# Patient Record
Sex: Male | Born: 1950
Health system: Southern US, Community
[De-identification: ages and names within clinical notes are randomized; demographics above are authoritative.]

## PROBLEM LIST (undated history)

## (undated) DIAGNOSIS — R2689 Other abnormalities of gait and mobility: Secondary | ICD-10-CM

## (undated) DIAGNOSIS — K56609 Unspecified intestinal obstruction, unspecified as to partial versus complete obstruction: Secondary | ICD-10-CM

## (undated) DIAGNOSIS — I10 Essential (primary) hypertension: Secondary | ICD-10-CM

## (undated) DIAGNOSIS — E7800 Pure hypercholesterolemia, unspecified: Secondary | ICD-10-CM

## (undated) DIAGNOSIS — M109 Gout, unspecified: Secondary | ICD-10-CM

## (undated) DIAGNOSIS — G20A1 Parkinson's disease without dyskinesia, without mention of fluctuations: Secondary | ICD-10-CM

## (undated) HISTORY — DX: Parkinson's disease without dyskinesia, without mention of fluctuations: G20.A1

## (undated) HISTORY — DX: Gout, unspecified: M10.9

## (undated) HISTORY — DX: Unspecified intestinal obstruction, unspecified as to partial versus complete obstruction: K56.609

## (undated) HISTORY — DX: Other abnormalities of gait and mobility: R26.89

## (undated) HISTORY — DX: Essential (primary) hypertension: I10

## (undated) HISTORY — DX: Pure hypercholesterolemia, unspecified: E78.00

---

## 2002-10-24 ENCOUNTER — Ambulatory Visit (HOSPITAL_COMMUNITY): Admission: RE | Admit: 2002-10-24 | Discharge: 2002-10-24 | Payer: Self-pay | Admitting: Gastroenterology

## 2016-11-25 DIAGNOSIS — I10 Essential (primary) hypertension: Secondary | ICD-10-CM | POA: Diagnosis not present

## 2016-11-25 DIAGNOSIS — Z0000 Encounter for general adult medical examination without abnormal findings: Secondary | ICD-10-CM | POA: Diagnosis not present

## 2016-11-25 DIAGNOSIS — Z23 Encounter for immunization: Secondary | ICD-10-CM | POA: Diagnosis not present

## 2016-11-25 DIAGNOSIS — E7800 Pure hypercholesterolemia, unspecified: Secondary | ICD-10-CM | POA: Diagnosis not present

## 2016-11-25 DIAGNOSIS — Z9181 History of falling: Secondary | ICD-10-CM | POA: Diagnosis not present

## 2016-11-25 DIAGNOSIS — M1A9XX1 Chronic gout, unspecified, with tophus (tophi): Secondary | ICD-10-CM | POA: Diagnosis not present

## 2016-11-25 DIAGNOSIS — Z125 Encounter for screening for malignant neoplasm of prostate: Secondary | ICD-10-CM | POA: Diagnosis not present

## 2016-11-27 ENCOUNTER — Other Ambulatory Visit: Payer: Self-pay | Admitting: Family Medicine

## 2016-11-27 DIAGNOSIS — Z0000 Encounter for general adult medical examination without abnormal findings: Secondary | ICD-10-CM

## 2016-12-05 ENCOUNTER — Ambulatory Visit
Admission: RE | Admit: 2016-12-05 | Discharge: 2016-12-05 | Disposition: A | Discharge status: Home / Self Care | Payer: Medicare Other | Source: Ambulatory Visit | Attending: Family Medicine | Admitting: Family Medicine

## 2016-12-05 DIAGNOSIS — Z0000 Encounter for general adult medical examination without abnormal findings: Secondary | ICD-10-CM

## 2016-12-05 DIAGNOSIS — Z8489 Family history of other specified conditions: Secondary | ICD-10-CM | POA: Diagnosis not present

## 2016-12-05 DIAGNOSIS — Z8249 Family history of ischemic heart disease and other diseases of the circulatory system: Secondary | ICD-10-CM | POA: Diagnosis not present

## 2016-12-05 DIAGNOSIS — Z136 Encounter for screening for cardiovascular disorders: Secondary | ICD-10-CM | POA: Diagnosis not present

## 2016-12-05 IMAGING — US US AORTA SCREENING (MEDICARE)
1 series · 13 of 13 positions shown · non-contrast
Comparison: None.

CLINICAL DATA: Patient with family history of AAA.

EXAM:
US ABDOMINAL AORTA MEDICARE SCREENING
TECHNIQUE: Ultrasound examination of the abdominal aorta was performed as a
screening evaluation for abdominal aortic aneurysm.

[Series 1: us aorta screening (medicare) · 0.25mm/px · 13 of 13 slices shown]
[im 1/13]
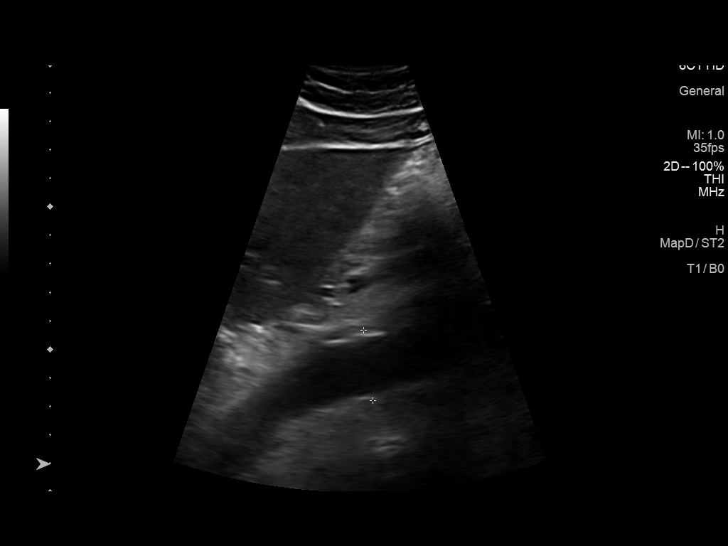
[im 2/13]
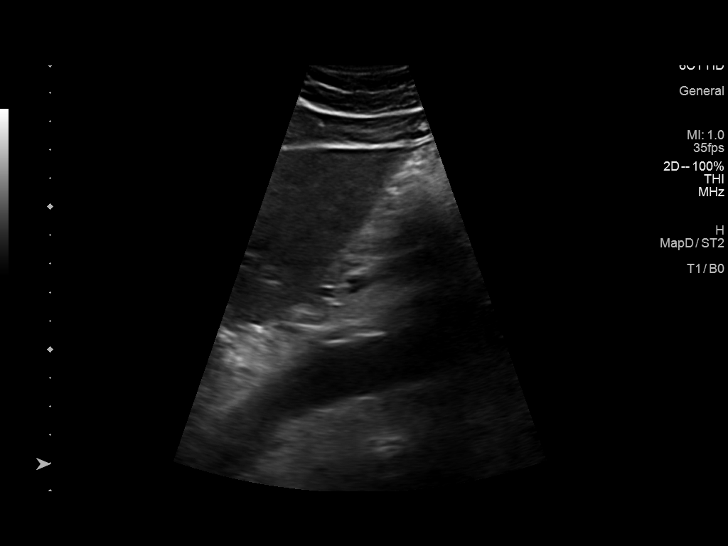
[im 3/13]
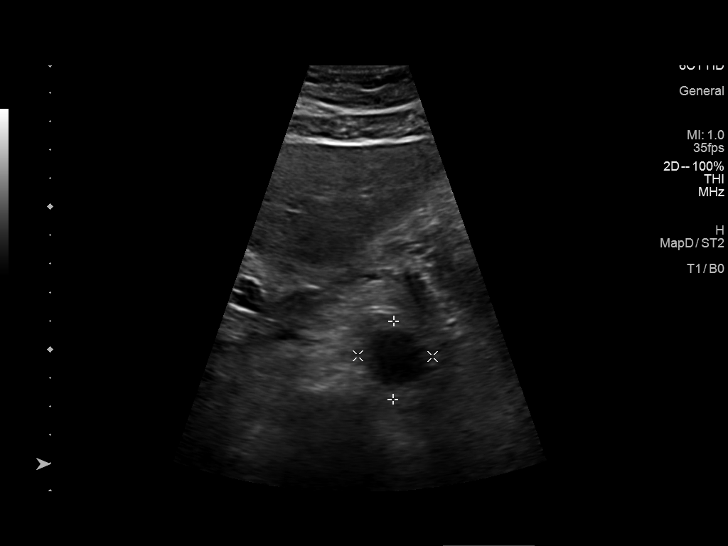
[im 4/13]
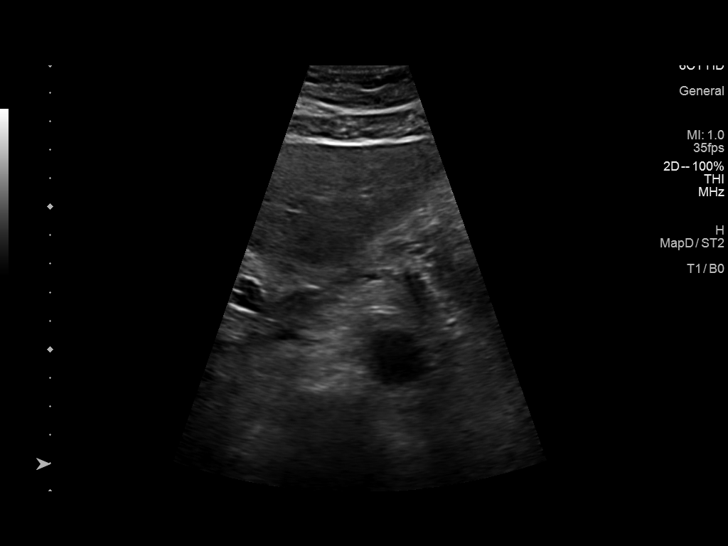
[im 5/13]
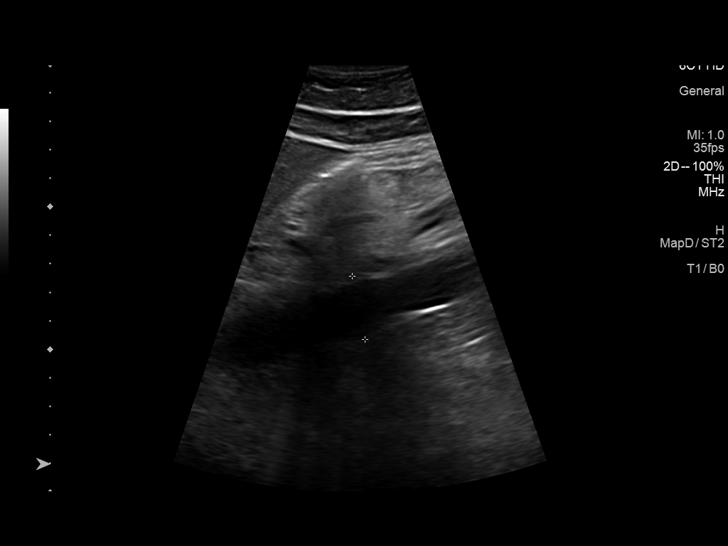
[im 6/13]
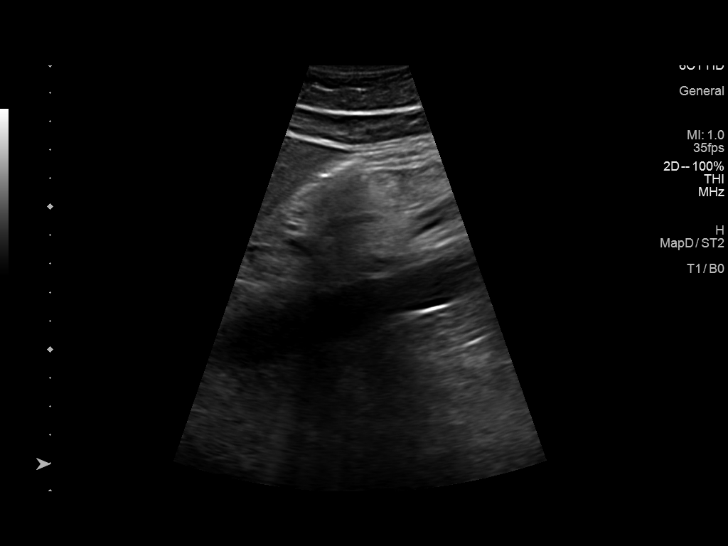
[im 7/13]
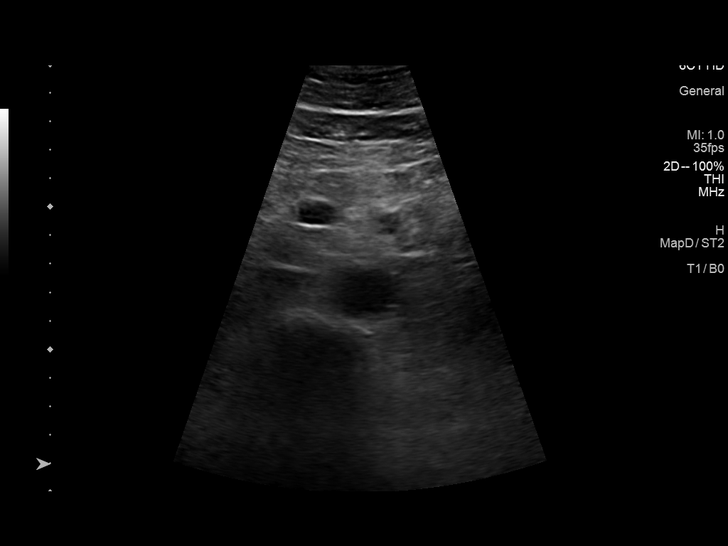
[im 8/13]
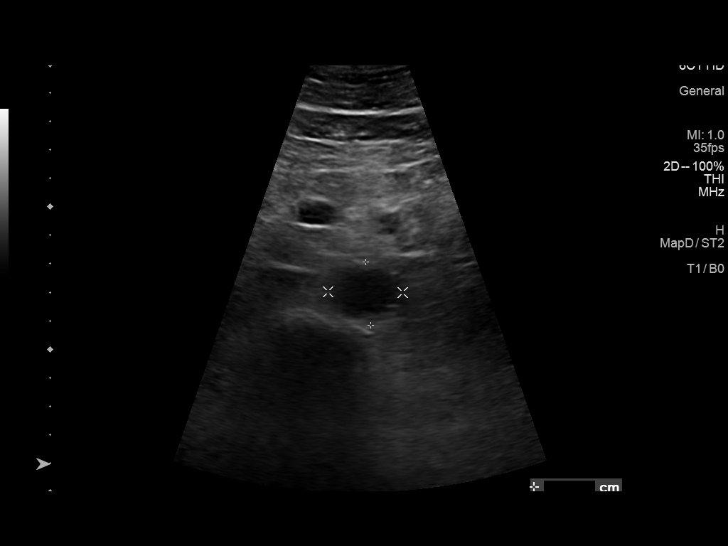
[im 9/13]
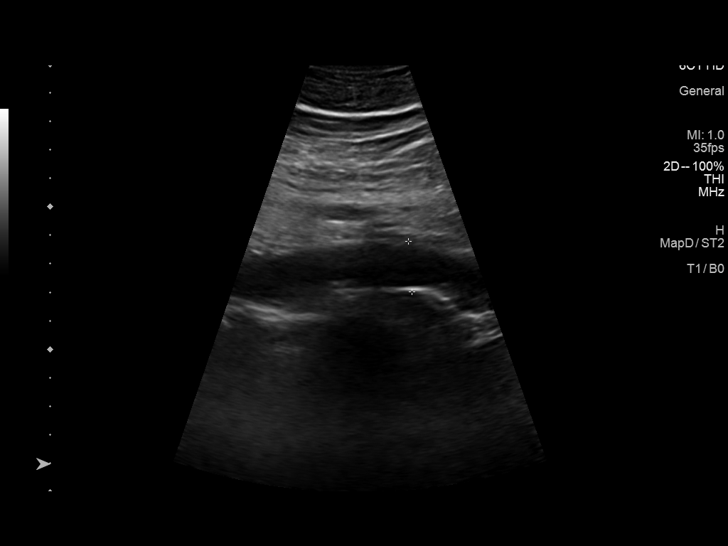
[im 10/13]
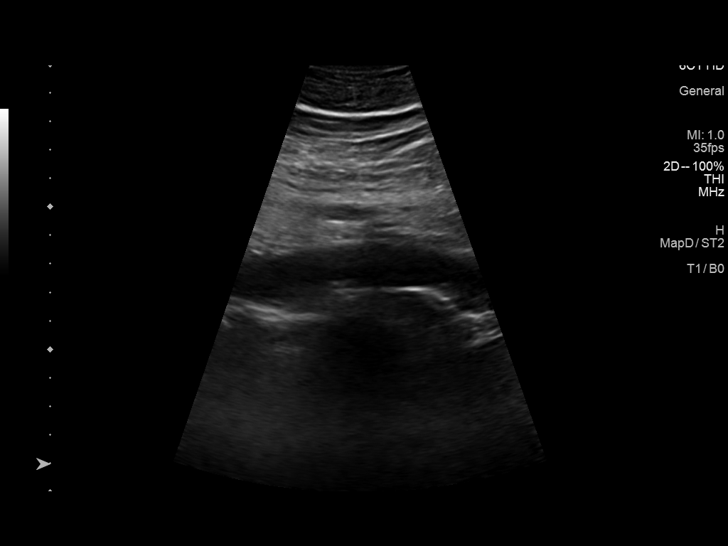
[im 11/13]
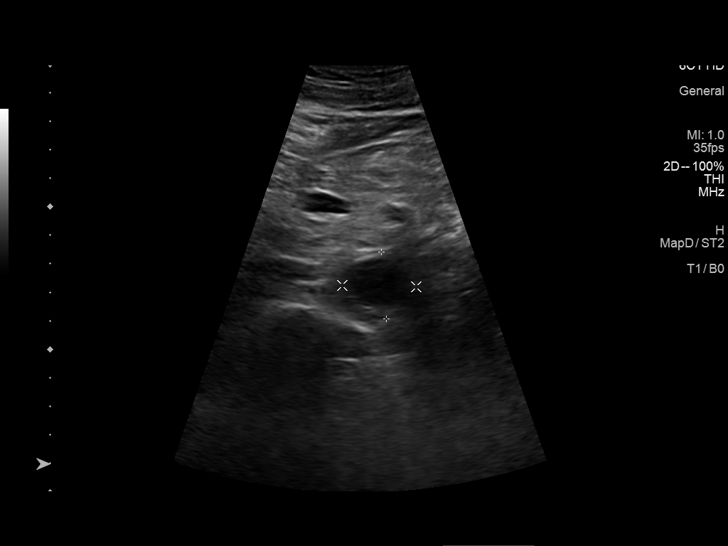
[im 12/13]
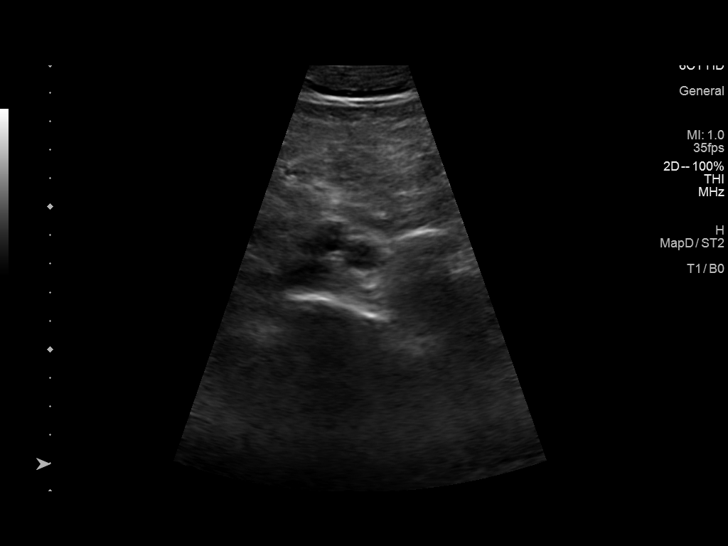
[im 13/13]
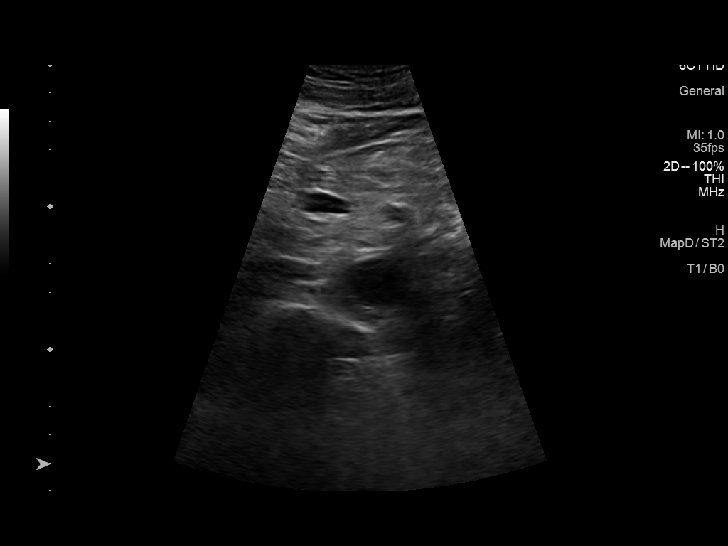

[13 of 13 positions shown; findings below may reference images not displayed]

FINDINGS: Abdominal aortic measurements as follows:

Proximal:  2.6 cm

Mid:  2.6 cm

Distal:  2.6 cm
IMPRESSION: Maximal diameter of the abdominal aorta is 2.6 cm. Ectatic abdominal
aorta at risk for aneurysm development. Recommend followup by
ultrasound in 5 years. This recommendation follows ACR consensus
guidelines: White Paper of the ACR Incidental Findings Committee II

## 2017-08-13 DIAGNOSIS — H2513 Age-related nuclear cataract, bilateral: Secondary | ICD-10-CM | POA: Diagnosis not present

## 2017-12-14 DIAGNOSIS — I10 Essential (primary) hypertension: Secondary | ICD-10-CM | POA: Diagnosis not present

## 2017-12-14 DIAGNOSIS — E7800 Pure hypercholesterolemia, unspecified: Secondary | ICD-10-CM | POA: Diagnosis not present

## 2017-12-14 DIAGNOSIS — Z125 Encounter for screening for malignant neoplasm of prostate: Secondary | ICD-10-CM | POA: Diagnosis not present

## 2017-12-17 DIAGNOSIS — Z0000 Encounter for general adult medical examination without abnormal findings: Secondary | ICD-10-CM | POA: Diagnosis not present

## 2017-12-17 DIAGNOSIS — M1A9XX1 Chronic gout, unspecified, with tophus (tophi): Secondary | ICD-10-CM | POA: Diagnosis not present

## 2017-12-17 DIAGNOSIS — Z1389 Encounter for screening for other disorder: Secondary | ICD-10-CM | POA: Diagnosis not present

## 2017-12-17 DIAGNOSIS — R2689 Other abnormalities of gait and mobility: Secondary | ICD-10-CM | POA: Diagnosis not present

## 2017-12-17 DIAGNOSIS — Z23 Encounter for immunization: Secondary | ICD-10-CM | POA: Diagnosis not present

## 2017-12-17 DIAGNOSIS — E7800 Pure hypercholesterolemia, unspecified: Secondary | ICD-10-CM | POA: Diagnosis not present

## 2017-12-17 DIAGNOSIS — I10 Essential (primary) hypertension: Secondary | ICD-10-CM | POA: Diagnosis not present

## 2017-12-18 NOTE — Progress Notes (Signed)
Cody Dickerson was seen today in the movement disorders clinic for neurologic consultation at the request of Lawerance Cruel, MD.  The consultation is for the evaluation of tremor and balance change and to r/o PD.  The records that were made available to me were reviewed.   Specific Symptoms:  Tremor: Yes.  , when first wakes up and then goes away when gets up.  Mostly the L hand.  He is R hand dominant.  Started few years ago.  Is not daily.  Does not persist in the day. Family hx of similar:  No. Voice: no change Sleep: some trouble d/t getting up to using the RR  Vivid Dreams:  No.  Acting out dreams:  No. Wet Pillows: No. Postural symptoms:  Yes.    - intermittently.  Falls?  No. (only fall was a few years ago in garden) Bradykinesia symptoms: no bradykinesia noted Loss of smell:  no Loss of taste:  some Urinary Incontinence:  No. Difficulty Swallowing:  No. Handwriting, micrographia: Yes.   and sloppier Trouble with ADL's:  No.  Trouble buttoning clothing: No. Depression:  No. Memory changes:  Minor word finding trouble Hallucinations:  No.  visual distortions: No. N/V:  No. Lightheaded:  No.  Syncope: No. Diplopia:  No. Dyskinesia:  No. Prior exposure to reglan/antipsychotics: No.  Neuroimaging of the brain has not previously been performed.   PREVIOUS MEDICATIONS: none to date  ALLERGIES:   Allergies  Allergen Reactions  . Lisinopril     CURRENT MEDICATIONS:  Outpatient Encounter Medications as of 12/22/2017  Medication Sig  . indomethacin (INDOCIN) 50 MG capsule 3 (three) times daily as needed.   Marland Kitchen losartan (COZAAR) 100 MG tablet daily.   . Multiple Vitamin (MULTIVITAMIN) tablet Take 1 tablet by mouth daily.  . rosuvastatin (CRESTOR) 20 MG tablet daily.    No facility-administered encounter medications on file as of 12/22/2017.     PAST MEDICAL HISTORY:   Past Medical History:  Diagnosis Date  . Balance problem   . Gout   .  Hypercholesterolemia   . Hypertension     PAST SURGICAL HISTORY:   Past Surgical History:  Procedure Laterality Date  . no surgical history      SOCIAL HISTORY:   Social History   Socioeconomic History  . Marital status: Married    Spouse name: Not on file  . Number of children: 2  . Years of education: 16  . Highest education level: Bachelor's degree (e.g., BA, AB, BS)  Occupational History  . Occupation: Geographical information systems officer  . Financial resource strain: Not on file  . Food insecurity:    Worry: Not on file    Inability: Not on file  . Transportation needs:    Medical: Not on file    Non-medical: Not on file  Tobacco Use  . Smoking status: Former Research scientist (life sciences)  . Smokeless tobacco: Never Used  Substance and Sexual Activity  . Alcohol use: Yes    Comment: 2 glasses wine/day  . Drug use: Never  . Sexual activity: Not on file  Lifestyle  . Physical activity:    Days per week: Not on file    Minutes per session: Not on file  . Stress: Not on file  Relationships  . Social connections:    Talks on phone: Not on file    Gets together: Not on file    Attends religious service: Not on file    Active member of club  or organization: Not on file    Attends meetings of clubs or organizations: Not on file    Relationship status: Not on file  . Intimate partner violence:    Fear of current or ex partner: Not on file    Emotionally abused: Not on file    Physically abused: Not on file    Forced sexual activity: Not on file  Other Topics Concern  . Not on file  Social History Narrative   Lives with wife in a one story home.  Has 2 children.  Self employed Hotel manager.  Education: college.     FAMILY HISTORY:   Family Status  Relation Name Status  . Mother  Alive  . Father  Alive  . Sister  Alive  . Brother  Alive  . Child 2 Alive    ROS:  Review of Systems  Constitutional: Positive for weight loss (via diet).  HENT: Negative.   Eyes: Negative.   Respiratory: Negative.    Cardiovascular: Negative.   Gastrointestinal: Negative.   Genitourinary: Negative.   Musculoskeletal: Negative.   Skin: Negative.   Endo/Heme/Allergies: Negative.   Psychiatric/Behavioral: Negative.     PHYSICAL EXAMINATION:    VITALS:   Vitals:   12/22/17 0838  BP: 130/88  Pulse: 63  SpO2: 98%  Weight: 181 lb (82.1 kg)  Height: 5' 8.5" (1.74 m)    GEN:  The patient appears stated age and is in NAD. HEENT:  Normocephalic, atraumatic.  The mucous membranes are moist. The superficial temporal arteries are without ropiness or tenderness. CV:  RRR Lungs:  CTAB Neck/HEME:  There are no carotid bruits bilaterally.  Neurological examination:  Orientation: The patient is alert and oriented x3. Fund of knowledge is appropriate.  Recent and remote memory are intact.  Attention and concentration are normal.    Able to name objects and repeat phrases. Cranial nerves: There is good facial symmetry. Pupils are equal round and reactive to light bilaterally. Fundoscopic exam reveals clear margins bilaterally. Extraocular muscles are intact. The visual fields are full to confrontational testing. The speech is fluent and clear. Soft palate rises symmetrically and there is no tongue deviation. Hearing is intact to conversational tone. Sensation: Sensation is intact to light and pinprick throughout (facial, trunk, extremities). Vibration is intact at the bilateral big toe. There is no extinction with double simultaneous stimulation. There is no sensory dermatomal level identified. Motor: Strength is 5/5 in the bilateral upper and lower extremities.   Shoulder shrug is equal and symmetric.  There is no pronator drift. Deep tendon reflexes: Deep tendon reflexes are 2/4 at the bilateral biceps, triceps, brachioradialis, patella and achilles. Plantar responses are downgoing bilaterally.  Movement examination: Tone: There is normal tone in the bilateral upper extremities.  The tone in the lower  extremities is normal.  Abnormal movements: There is no rest tremor, even with distraction procedures.  There is no tremor of the outstretched hands.  There is no intention tremor.  He is able to pour water from one glass to another. Coordination:  There is no decremation with RAM's, with any form of RAMS, including alternating supination and pronation of the forearm, hand opening and closing, finger taps, heel taps and toe taps. Gait and Station: The patient has no difficulty arising out of a deep-seated chair without the use of the hands. The patient's stride length is normal.  He is able to ambulate in a tandem fashion.  He is able to stand in the Romberg position.  Labs: Lab work was completed on December 14, 2017.  Sodium was 141, potassium 4.8, chloride 105, CO2 29, BUN 18 and creatinine 0.95.  Total cholesterol is 186, LDL 94.  ASSESSMENT/PLAN:  1.  Gait change  -I did not see any tremor or gait instability on examination today.  I reassured him that I saw no evidence of a neurodegenerative process.  He does not meet any criteria for Parkinson's disease.  I did check his TSH.  I did tell him that if things should worsen or new neurologic symptoms develop, I would like to see him back.  Otherwise, I will see him on an as-needed basis.  Cc:  Lawerance Cruel, MD

## 2017-12-22 ENCOUNTER — Ambulatory Visit (INDEPENDENT_AMBULATORY_CARE_PROVIDER_SITE_OTHER): Payer: Medicare Other | Admitting: Neurology

## 2017-12-22 ENCOUNTER — Other Ambulatory Visit (INDEPENDENT_AMBULATORY_CARE_PROVIDER_SITE_OTHER): Payer: Medicare Other

## 2017-12-22 ENCOUNTER — Telehealth: Payer: Self-pay | Admitting: *Deleted

## 2017-12-22 ENCOUNTER — Encounter: Payer: Self-pay | Admitting: Neurology

## 2017-12-22 VITALS — BP 130/88 | HR 63 | Ht 68.5 in | Wt 181.0 lb

## 2017-12-22 DIAGNOSIS — R251 Tremor, unspecified: Secondary | ICD-10-CM

## 2017-12-22 LAB — TSH: TSH: 1.69 u[IU]/mL (ref 0.35–4.50)

## 2017-12-22 NOTE — Telephone Encounter (Signed)
Left message notifying patient.

## 2017-12-22 NOTE — Telephone Encounter (Signed)
-----   Message from [place], DO sent at 12/22/2017 12:27 PM EDT ----- Let pt know that TSH is normal

## 2018-09-23 DIAGNOSIS — H5203 Hypermetropia, bilateral: Secondary | ICD-10-CM | POA: Diagnosis not present

## 2018-09-23 DIAGNOSIS — H524 Presbyopia: Secondary | ICD-10-CM | POA: Diagnosis not present

## 2018-09-23 DIAGNOSIS — H25013 Cortical age-related cataract, bilateral: Secondary | ICD-10-CM | POA: Diagnosis not present

## 2018-09-23 DIAGNOSIS — H2513 Age-related nuclear cataract, bilateral: Secondary | ICD-10-CM | POA: Diagnosis not present

## 2018-11-29 DIAGNOSIS — Z23 Encounter for immunization: Secondary | ICD-10-CM | POA: Diagnosis not present

## 2018-12-23 DIAGNOSIS — M1A9XX1 Chronic gout, unspecified, with tophus (tophi): Secondary | ICD-10-CM | POA: Diagnosis not present

## 2018-12-23 DIAGNOSIS — E7800 Pure hypercholesterolemia, unspecified: Secondary | ICD-10-CM | POA: Diagnosis not present

## 2018-12-23 DIAGNOSIS — I10 Essential (primary) hypertension: Secondary | ICD-10-CM | POA: Diagnosis not present

## 2018-12-23 DIAGNOSIS — Z125 Encounter for screening for malignant neoplasm of prostate: Secondary | ICD-10-CM | POA: Diagnosis not present

## 2018-12-28 DIAGNOSIS — L570 Actinic keratosis: Secondary | ICD-10-CM | POA: Diagnosis not present

## 2018-12-28 DIAGNOSIS — M1A9XX1 Chronic gout, unspecified, with tophus (tophi): Secondary | ICD-10-CM | POA: Diagnosis not present

## 2018-12-28 DIAGNOSIS — I10 Essential (primary) hypertension: Secondary | ICD-10-CM | POA: Diagnosis not present

## 2018-12-28 DIAGNOSIS — E7800 Pure hypercholesterolemia, unspecified: Secondary | ICD-10-CM | POA: Diagnosis not present

## 2018-12-28 DIAGNOSIS — Z0000 Encounter for general adult medical examination without abnormal findings: Secondary | ICD-10-CM | POA: Diagnosis not present

## 2019-03-22 ENCOUNTER — Ambulatory Visit: Payer: Medicare Other | Attending: Internal Medicine

## 2019-03-22 DIAGNOSIS — Z23 Encounter for immunization: Secondary | ICD-10-CM | POA: Insufficient documentation

## 2019-03-22 NOTE — Progress Notes (Signed)
   Covid-19 Vaccination Clinic  Name:  Cody Dickerson    MRN: [id number] DOB: Jun 11, 1950  03/22/2019  [name] was observed post Covid-19 immunization for 15 minutes without incidence. He was provided with Vaccine Information Sheet and instruction to access the V-Safe system.   [name] was instructed to call 911 with any severe reactions post vaccine: Marland Kitchen Difficulty breathing  . Swelling of your face and throat  . A fast heartbeat  . A bad rash all over your body  . Dizziness and weakness    Immunizations Administered    Name Date Dose VIS Date Route   Pfizer COVID-19 Vaccine 03/22/2019  1:23 PM 0.3 mL 02/11/2019 Intramuscular   Manufacturer: [place]   Lot: [id number]   [place]: [id number]

## 2019-04-11 ENCOUNTER — Ambulatory Visit: Payer: Medicare Other | Attending: Internal Medicine

## 2019-04-11 DIAGNOSIS — Z23 Encounter for immunization: Secondary | ICD-10-CM | POA: Insufficient documentation

## 2019-04-11 NOTE — Progress Notes (Signed)
   Covid-19 Vaccination Clinic  Name:  Cody Dickerson    MRN: [id number] DOB: May 23, 1950  04/11/2019  [name] was observed post Covid-19 immunization for 15 minutes without incidence. He was provided with Vaccine Information Sheet and instruction to access the V-Safe system.   [name] was instructed to call 911 with any severe reactions post vaccine: Marland Kitchen Difficulty breathing  . Swelling of your face and throat  . A fast heartbeat  . A bad rash all over your body  . Dizziness and weakness    Immunizations Administered    Name Date Dose VIS Date Route   Pfizer COVID-19 Vaccine 04/11/2019  8:53 AM 0.3 mL 02/11/2019 Intramuscular   Manufacturer: [place]   Lot: [id number]   [place]: [id number]

## 2019-05-02 HISTORY — PX: OTHER SURGICAL HISTORY: SHX169

## 2019-05-26 DIAGNOSIS — E785 Hyperlipidemia, unspecified: Secondary | ICD-10-CM | POA: Diagnosis not present

## 2019-05-26 DIAGNOSIS — R109 Unspecified abdominal pain: Secondary | ICD-10-CM | POA: Diagnosis not present

## 2019-05-26 DIAGNOSIS — N179 Acute kidney failure, unspecified: Secondary | ICD-10-CM | POA: Diagnosis not present

## 2019-05-26 DIAGNOSIS — K56609 Unspecified intestinal obstruction, unspecified as to partial versus complete obstruction: Secondary | ICD-10-CM | POA: Diagnosis not present

## 2019-05-26 DIAGNOSIS — K668 Other specified disorders of peritoneum: Secondary | ICD-10-CM | POA: Diagnosis not present

## 2019-05-26 DIAGNOSIS — I10 Essential (primary) hypertension: Secondary | ICD-10-CM | POA: Diagnosis not present

## 2019-05-26 DIAGNOSIS — K6389 Other specified diseases of intestine: Secondary | ICD-10-CM | POA: Diagnosis not present

## 2019-05-26 DIAGNOSIS — J9811 Atelectasis: Secondary | ICD-10-CM | POA: Diagnosis not present

## 2019-05-26 DIAGNOSIS — K559 Vascular disorder of intestine, unspecified: Secondary | ICD-10-CM | POA: Diagnosis not present

## 2019-05-26 DIAGNOSIS — K56699 Other intestinal obstruction unspecified as to partial versus complete obstruction: Secondary | ICD-10-CM | POA: Diagnosis not present

## 2019-05-27 DIAGNOSIS — R0902 Hypoxemia: Secondary | ICD-10-CM | POA: Diagnosis not present

## 2019-05-27 DIAGNOSIS — E860 Dehydration: Secondary | ICD-10-CM | POA: Diagnosis present

## 2019-05-27 DIAGNOSIS — Z5331 Laparoscopic surgical procedure converted to open procedure: Secondary | ICD-10-CM | POA: Diagnosis not present

## 2019-05-27 DIAGNOSIS — I10 Essential (primary) hypertension: Secondary | ICD-10-CM | POA: Diagnosis present

## 2019-05-27 DIAGNOSIS — Z20822 Contact with and (suspected) exposure to covid-19: Secondary | ICD-10-CM | POA: Diagnosis present

## 2019-05-27 DIAGNOSIS — R109 Unspecified abdominal pain: Secondary | ICD-10-CM | POA: Diagnosis not present

## 2019-05-27 DIAGNOSIS — J9811 Atelectasis: Secondary | ICD-10-CM | POA: Diagnosis not present

## 2019-05-27 DIAGNOSIS — K56699 Other intestinal obstruction unspecified as to partial versus complete obstruction: Secondary | ICD-10-CM | POA: Diagnosis present

## 2019-05-27 DIAGNOSIS — J984 Other disorders of lung: Secondary | ICD-10-CM | POA: Diagnosis not present

## 2019-05-27 DIAGNOSIS — N179 Acute kidney failure, unspecified: Secondary | ICD-10-CM | POA: Diagnosis present

## 2019-05-27 DIAGNOSIS — E785 Hyperlipidemia, unspecified: Secondary | ICD-10-CM | POA: Diagnosis present

## 2019-05-27 DIAGNOSIS — K56609 Unspecified intestinal obstruction, unspecified as to partial versus complete obstruction: Secondary | ICD-10-CM | POA: Diagnosis not present

## 2019-05-27 DIAGNOSIS — Z9889 Other specified postprocedural states: Secondary | ICD-10-CM | POA: Diagnosis not present

## 2019-05-27 DIAGNOSIS — K559 Vascular disorder of intestine, unspecified: Secondary | ICD-10-CM | POA: Diagnosis present

## 2019-06-06 DIAGNOSIS — K56609 Unspecified intestinal obstruction, unspecified as to partial versus complete obstruction: Secondary | ICD-10-CM | POA: Diagnosis not present

## 2019-06-06 DIAGNOSIS — Z09 Encounter for follow-up examination after completed treatment for conditions other than malignant neoplasm: Secondary | ICD-10-CM | POA: Diagnosis not present

## 2019-06-08 DIAGNOSIS — L7682 Other postprocedural complications of skin and subcutaneous tissue: Secondary | ICD-10-CM | POA: Diagnosis not present

## 2019-06-08 DIAGNOSIS — L089 Local infection of the skin and subcutaneous tissue, unspecified: Secondary | ICD-10-CM | POA: Diagnosis not present

## 2019-06-13 DIAGNOSIS — K56609 Unspecified intestinal obstruction, unspecified as to partial versus complete obstruction: Secondary | ICD-10-CM | POA: Diagnosis not present

## 2019-06-13 DIAGNOSIS — L089 Local infection of the skin and subcutaneous tissue, unspecified: Secondary | ICD-10-CM | POA: Diagnosis not present

## 2019-06-13 DIAGNOSIS — L7682 Other postprocedural complications of skin and subcutaneous tissue: Secondary | ICD-10-CM | POA: Diagnosis not present

## 2019-06-20 DIAGNOSIS — D72829 Elevated white blood cell count, unspecified: Secondary | ICD-10-CM | POA: Diagnosis not present

## 2019-06-20 DIAGNOSIS — K559 Vascular disorder of intestine, unspecified: Secondary | ICD-10-CM | POA: Diagnosis not present

## 2019-06-20 DIAGNOSIS — R2689 Other abnormalities of gait and mobility: Secondary | ICD-10-CM | POA: Diagnosis not present

## 2019-06-30 ENCOUNTER — Ambulatory Visit: Payer: Medicare Other | Admitting: Neurology

## 2019-07-04 DIAGNOSIS — D72829 Elevated white blood cell count, unspecified: Secondary | ICD-10-CM | POA: Diagnosis not present

## 2019-07-04 DIAGNOSIS — R2689 Other abnormalities of gait and mobility: Secondary | ICD-10-CM | POA: Diagnosis not present

## 2019-07-04 DIAGNOSIS — R799 Abnormal finding of blood chemistry, unspecified: Secondary | ICD-10-CM | POA: Diagnosis not present

## 2019-07-04 DIAGNOSIS — K559 Vascular disorder of intestine, unspecified: Secondary | ICD-10-CM | POA: Diagnosis not present

## 2019-07-05 NOTE — Progress Notes (Signed)
Assessment/Plan:   1.  Dizziness/vertigo  - We discussed CTA brain and we will get that scheduled.  2.  Parkinsonism  -very mild but wonder if developing Parkinsons Disease.  He has some bradykinesia.  Will do DaT scan (he did see me years ago for tremor but did not see evidence of Parkinsons Disease then).     Subjective:   Cody Dickerson was seen today in neurologic consultation at the request of Lawerance Cruel, MD.  The consultation is for the evaluation of balance change and to r/o posterior circulation ischemia.  Patient was seen by me in October, 2019 for tremor and balance change.  At that point in time, I did not see any evidence of tremor or gait instability on his examination.  He was referred back today with concerns for possible posterior circulation ischemia.  This arose after an admission at [facility].  I reviewed records made available to me.  He presented with nausea, vomiting and abdominal pain that led to the diagnosis of small bowel obstruction.  Because of that, his PCP wanted to know if it was possible he also had ischemia elsewhere in posterior cerebral circulation and sent here to r/o that.  Pt states that his balance has been off "lately."  It is "only when I am up walking.  It feels like I have had a drink too many."  No spinning.  States that "its an equilibrium problem, like I don't have my balance."  Pt states that it feels like this daily.  No paresthesias in the feet.  No differences if eyes closed in the shower.  Thinks that the bright light outside affects him/balance in a neg fashion when walking.  Notices that when looking up, "I feel a different sensation, but not necessarily woozy."  No n/v.  "I don't know if its vertigo."  Only falls have been when pulling up vines in the garden.  Also worries that handwriting is not as good as used to be.     Neuroimaging of the brain has not previously been performed.     ALLERGIES:   Allergies  Allergen Reactions  .  Lisinopril Cough    CURRENT MEDICATIONS:  Outpatient Encounter Medications as of 07/07/2019  Medication Sig  . indomethacin (INDOCIN) 50 MG capsule 3 (three) times daily as needed.   Marland Kitchen losartan (COZAAR) 100 MG tablet Take 100 mg by mouth daily.   . Multiple Vitamin (MULTIVITAMIN) tablet Take 1 tablet by mouth daily.  . rosuvastatin (CRESTOR) 20 MG tablet Take 20 mg by mouth daily.    No facility-administered encounter medications on file as of 07/07/2019.    Objective:   PHYSICAL EXAMINATION:    VITALS:   Vitals:   07/07/19 0914  BP: (!) 155/76  Pulse: 61  SpO2: 99%  Weight: 165 lb (74.8 kg)  Height: 5\' 9"  (1.753 m)    GEN:  Normal appears male in no acute distress.  Appears stated age. HEENT:  Normocephalic, atraumatic. The mucous membranes are moist. The superficial temporal arteries are without ropiness or tenderness. Cardiovascular: Regular rate and rhythm. Lungs: Clear to auscultation bilaterally. Neck/Heme: There are no carotid bruits noted bilaterally.  NEUROLOGICAL: Orientation:  The patient is alert and oriented x 3.   Cranial nerves: There is good facial symmetry.  There is some facial hypomimia with decreased blink.   Extraocular muscles are intact and visual fields are full to confrontational testing. Speech is fluent and clear. Soft palate rises symmetrically and there  is no tongue deviation. Hearing is intact to conversational tone. Tone: Tone is good throughout. Sensation: Sensation is intact to light touch and pinprick throughout (facial, trunk, extremities). Vibration is intact at the bilateral big toe. There is no extinction with double simultaneous stimulation. There is no sensory dermatomal level identified. Coordination:  The patient has decremation, L>R with RAMs.   Motor: Strength is 5/5 in the bilateral upper and lower extremities.  Shoulder shrug is equal and symmetric. There is no pronator drift.  There are no fasciculations noted. DTR's: Deep tendon  reflexes are 2+-3-/4 at the bilateral biceps, triceps, brachioradialis, patella and achilles.  Plantar responses are downgoing bilaterally. Gait and Station: The patient arises without using the hands.  He has good stride length and decreased arm swing on the right Abnormal movements:  Tremor can be felt but not seen with distraction    Total time spent on today's visit was 50 minutes, including both face-to-face time and nonface-to-face time.  Time included that spent on review of records (prior notes available to me/labs/imaging if pertinent), discussing treatment and goals, answering patient's questions and coordinating care.   Cc:  Lawerance Cruel, MD

## 2019-07-06 DIAGNOSIS — K56609 Unspecified intestinal obstruction, unspecified as to partial versus complete obstruction: Secondary | ICD-10-CM | POA: Insufficient documentation

## 2019-07-07 ENCOUNTER — Encounter: Payer: Self-pay | Admitting: Neurology

## 2019-07-07 ENCOUNTER — Other Ambulatory Visit: Payer: Self-pay

## 2019-07-07 ENCOUNTER — Telehealth: Payer: Self-pay

## 2019-07-07 ENCOUNTER — Ambulatory Visit (INDEPENDENT_AMBULATORY_CARE_PROVIDER_SITE_OTHER): Payer: Medicare Other | Admitting: Neurology

## 2019-07-07 VITALS — BP 155/76 | HR 61 | Ht 69.0 in | Wt 165.0 lb

## 2019-07-07 DIAGNOSIS — R42 Dizziness and giddiness: Secondary | ICD-10-CM

## 2019-07-07 DIAGNOSIS — R251 Tremor, unspecified: Secondary | ICD-10-CM

## 2019-07-07 NOTE — Patient Instructions (Addendum)
1. [facility] will be in touch with you about scheduling your CTA of your head. If you have not head from them within a week please give them a call at [phone].  2. Dr Tat has ordered a DatScan once the test has been approved a scheduler from [employer] will contact you.   3. Your physician recommends that you schedule a follow-up appointment in: 8 weeks with Dr Tat.

## 2019-07-07 NOTE — Telephone Encounter (Signed)
-----   Message from Sheffield Slider sent at 07/07/2019  1:11 PM EDT ----- Regarding: RE: new imaging order No auth required ----- Message ----- From: Harold Hedge, CMA Sent: 07/07/2019  11:14 AM EDT To: Sheffield Slider Subject: new imaging order                              Hello,   An CTA with and without has been ordered for the patient to have at [place].   Thanks,  [employer]

## 2019-07-07 NOTE — Telephone Encounter (Signed)
Called patient to tell him to him to contact [facility] but patient stated they had already contacted him and scheduled his test.

## 2019-07-15 DIAGNOSIS — L7682 Other postprocedural complications of skin and subcutaneous tissue: Secondary | ICD-10-CM | POA: Diagnosis not present

## 2019-07-25 ENCOUNTER — Telehealth: Payer: Self-pay | Admitting: Neurology

## 2019-07-25 ENCOUNTER — Ambulatory Visit
Admission: RE | Admit: 2019-07-25 | Discharge: 2019-07-25 | Disposition: A | Discharge status: Home / Self Care | Payer: Medicare Other | Source: Ambulatory Visit | Attending: Neurology | Admitting: Neurology

## 2019-07-25 ENCOUNTER — Other Ambulatory Visit: Payer: Self-pay

## 2019-07-25 DIAGNOSIS — R42 Dizziness and giddiness: Secondary | ICD-10-CM | POA: Diagnosis not present

## 2019-07-25 IMAGING — CT CT ANGIO HEAD
2 of 4 series · 6 of 30 positions shown · IV contrast (APPLIED)
Comparison: None.

CLINICAL DATA: Dizziness

EXAM:
CT ANGIOGRAPHY HEAD
TECHNIQUE: Multidetector CT imaging of the head was performed using the
standard protocol during bolus administration of intravenous
contrast. Multiplanar CT image reconstructions and MIPs were
obtained to evaluate the vascular anatomy.
CONTRAST:  75mL CJABXF-XG6 IOPAMIDOL (CJABXF-XG6) INJECTION 76%

[Series 3: head w/(date) · axial · 0.45mm/px · z∈[+7,+67]mm · 2 of 38 slices shown]
[im 13/38  brain]
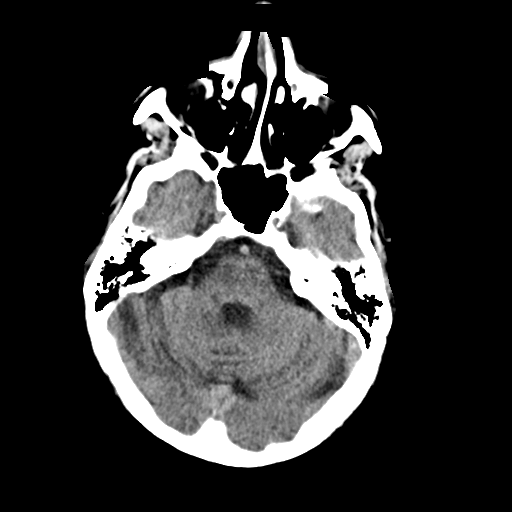
[im 25/38  brain]
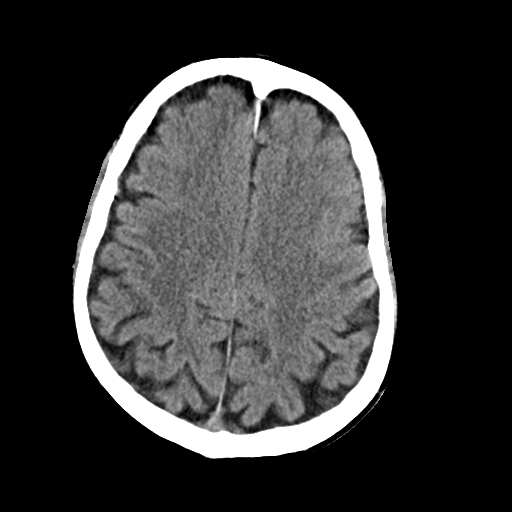

[Series 9: head angio · axial · 0.46mm/px · z∈[-16,+92]mm · 4 of 62 slices shown]
[im 13/62  brain]
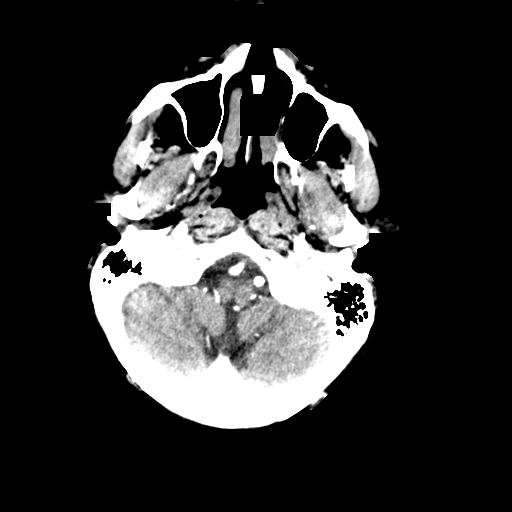
[im 25/62  bone]
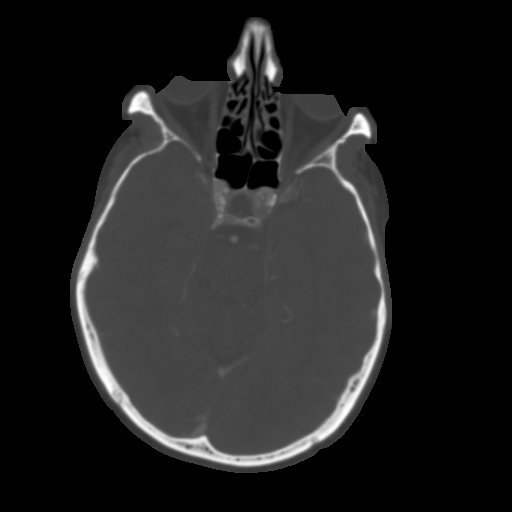
[im 37/62  brain]
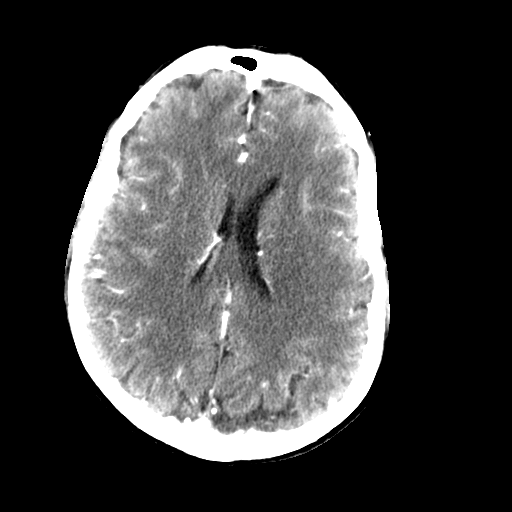
[im 49/62  bone]
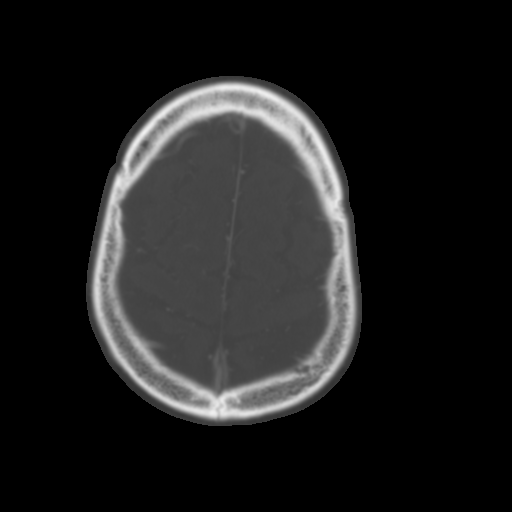

[6 of 30 positions shown; findings below may reference images not displayed]

FINDINGS: CT HEAD

Brain: There is no acute intracranial hemorrhage, mass effect, or
edema. Gray-white differentiation is preserved. Ventricles and sulci
are within normal limits in size and configuration. There is no
extra-axial fluid collection.

Vascular: Intracranial atherosclerotic calcification is present at
the skull base.

Skull: Unremarkable

Sinuses: Minor mucosal thickening.

Orbits: Unremarkable

CTA HEAD

Anterior circulation: Intracranial internal carotid arteries are
patent with calcified plaque causing mild stenosis. There is a 2 mm
inferiorly directed outpouching from the distal supraclinoid left
ICA in the expected location of a posterior communicating artery
origin. Anterior and middle cerebral arteries are patent.

Posterior circulation: Intracranial vertebral arteries are patent
with calcified plaque causing mild stenosis. PICA origins are
patent. Basilar artery is patent. Superior cerebellar artery origins
are patent. Posterior cerebral arteries are patent. Probable small
right posterior communicating artery.

Venous sinuses: As permitted by contrast timing, patent.
IMPRESSION: No acute intracranial abnormality.

No significant intracranial stenosis.

2 mm aneurysm of the distal supraclinoid left ICA.

## 2019-07-25 MED ORDER — IOPAMIDOL (ISOVUE-370) INJECTION 76%
75.0000 mL | Freq: Once | INTRAVENOUS | Status: AC | PRN
  Administered 2019-07-25: 75 mL via INTRAVENOUS

## 2019-07-25 NOTE — Telephone Encounter (Signed)
Left message for patient to contact office.

## 2019-07-25 NOTE — Telephone Encounter (Signed)
Patient has been notified directly; all questions, if any, were answered. Patient voiced understanding.   

## 2019-07-25 NOTE — Telephone Encounter (Signed)
Let pt know that there is a very small aneurysm (2mm).  It was unrelated to his vertigo.  At this point, we will just monitor it and rescan it in the future, perhaps 9 months to a year.

## 2019-07-26 ENCOUNTER — Telehealth: Payer: Self-pay

## 2019-07-26 NOTE — Telephone Encounter (Signed)
Left message for patient to contact office.

## 2019-07-26 NOTE — Telephone Encounter (Signed)
Received call from patient and he wanted to know if there was anything he needed to to or not do because he has this aneurysm. Advised patient to continue doing his normal routine and we will keep an eye on his aneurysm.   He also states he is noticing some memory loss and thinks its more permenant now. He states he cant retrieve what he ants to  Say then a few momets later it comes to him.   He states in the CT report it says something is patent he wants to know if that means something good or should he be concerned.

## 2019-07-26 NOTE — Telephone Encounter (Signed)
Patient has been notified directly; all questions, if any, were answered. Patient voiced understanding.   

## 2019-07-26 NOTE — Telephone Encounter (Signed)
He needs to remember that those reports are in medical terminology for [place] and not for them.  Patent means wide open.  That's how you want your blood vessels.  Re:  restrictions.  Just no heavy weight lifting but otherwise no restrictions.  This is purely incidental finding.

## 2019-07-28 ENCOUNTER — Encounter (HOSPITAL_COMMUNITY)
Admission: RE | Admit: 2019-07-28 | Discharge: 2019-07-28 | Disposition: A | Discharge status: Home / Self Care | Payer: Medicare Other | Source: Ambulatory Visit | Attending: Neurology | Admitting: Neurology

## 2019-07-28 ENCOUNTER — Other Ambulatory Visit: Payer: Self-pay

## 2019-07-28 ENCOUNTER — Ambulatory Visit: Payer: Medicare Other | Admitting: Neurology

## 2019-07-28 ENCOUNTER — Ambulatory Visit (HOSPITAL_COMMUNITY)
Admission: RE | Admit: 2019-07-28 | Discharge: 2019-07-28 | Disposition: A | Discharge status: Home / Self Care | Payer: Medicare Other | Source: Ambulatory Visit | Attending: Neurology | Admitting: Neurology

## 2019-07-28 DIAGNOSIS — R251 Tremor, unspecified: Secondary | ICD-10-CM | POA: Diagnosis not present

## 2019-07-28 IMAGING — NM NM DATSCAN
3 series · 18 of 18 positions shown · non-contrast
Comparison: None.

CLINICAL DATA: 68-year-old male.  LEFT hand tremors.

EXAM:
NUCLEAR MEDICINE BRAIN IMAGING WITH SPECT  (DaTscan )
TECHNIQUE: SPECT images of the brain were obtained after intravenous injection
of radiopharmaceutical. 4 hour post injection imaging. Appropriate
positioning. 0.8 ml lugols solution administered in a.m
RADIOPHARMACEUTICALS:  4.8 millicuries I 123 Ioflupane

[Series 1: spect - (id) _(id)_cor · 4.1mm · 4.14mm/px · 6 of 128 frames shown]
[frame 11/128]
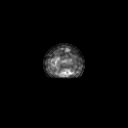
[frame 32/128]
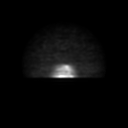
[frame 54/128]
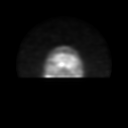
[frame 75/128]
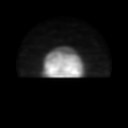
[frame 96/128]
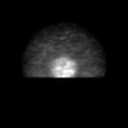
[frame 118/128]
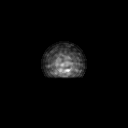

[Series 1: spect - (id) _(id)_tra · 4.1mm · 4.14mm/px · 6 of 128 frames shown]
[frame 11/128]
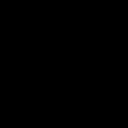
[frame 32/128]
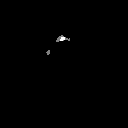
[frame 54/128]
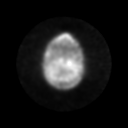
[frame 75/128]
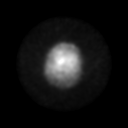
[frame 96/128]
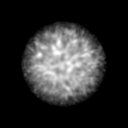
[frame 118/128]
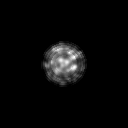

[Series 1: brain spect · 4.14mm/px · 6 of 120 frames shown]
[frame 11/120  full-range]
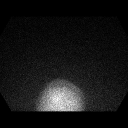
[frame 31/120  full-range]
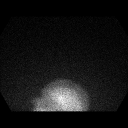
[frame 51/120  full-range]
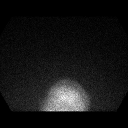
[frame 71/120  full-range]
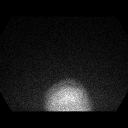
[frame 91/120  full-range]
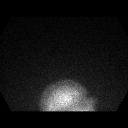
[frame 111/120  full-range]
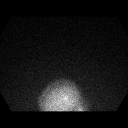

[18 of 18 positions shown; findings below may reference images not displayed]

FINDINGS: Some motion degradation of the imaging. Striata are relatively
normal "coma" shape. No clear loss dopamine transport activity
within the posterior striata or heads of the caudate nuclei. There
is slight asymmetry with the RIGHT putamen slightly taper compared
to the LEFT.
IMPRESSION: While there is mild asymmetry in striata as described above, overall
favor exam within normal limits without clear loss of dopamine
transporter activity to suggest Parkinson's syndrome pathology.

## 2019-07-28 MED ORDER — IODINE STRONG (LUGOLS) 5 % PO SOLN
ORAL | Status: AC
  Filled 2019-07-28: qty 1

## 2019-07-28 MED ORDER — IODINE STRONG (LUGOLS) 5 % PO SOLN
0.2000 mL | Freq: Once | ORAL | Status: AC
  Administered 2019-07-28: 0.2 mL via ORAL

## 2019-07-28 MED ORDER — IOFLUPANE I 123 185 MBQ/2.5ML IV SOLN
4.8000 | Freq: Once | INTRAVENOUS | Status: AC | PRN
  Administered 2019-07-28: 4.8 via INTRAVENOUS
  Filled 2019-07-28: qty 5

## 2019-08-02 NOTE — Progress Notes (Signed)
   Assessment/Plan:   1.  Left distal supraclinoid ICA aneurysm  -Incidental, 2 mm.  Told patient that we would rescan his aneurysm in 9 months to a year.  Also gave patient the option of seeking consultation with neurosurgery or interventional radiology. He would like to talk with his wife and he will get back to me.  Discussed keeping BP under control.  2.  Parkinsonism  -Patient does not meet [place] brain bank/modified MDS criteria because he only has bradykinesia.  DaTscan was relatively equivocal, but considered to be normal.  Would like to monitor him with time just to make sure he is not developing anything.  -did discuss importance of exercise, particularly stationary biking.  -did discuss doing PT for balance and he declined for now   Subjective:   Cody Dickerson was seen today in follow up for test results.  My previous records as well as any outside records available were reviewed prior to todays visit.  He has already been called with his CT angio of his head for vertigo.  Incidental finding of a 2 mm aneurysm of the distal supraclinoid left ICA was noted.  DaTscan was completed and was felt to be unremarkable.  There was slight asymmetry in the right putamen compared to that of the left.   CURRENT MEDICATIONS:  Outpatient Encounter Medications as of 08/04/2019  Medication Sig  . indomethacin (INDOCIN) 50 MG capsule 3 (three) times daily as needed.   Marland Kitchen losartan (COZAAR) 100 MG tablet Take 100 mg by mouth daily.   . Multiple Vitamin (MULTIVITAMIN) tablet Take 1 tablet by mouth daily.  . rosuvastatin (CRESTOR) 20 MG tablet Take 20 mg by mouth daily.   . [DISCONTINUED] Iodine Strong (Lugols) 5 % solution    No facility-administered encounter medications on file as of 08/04/2019.     Objective:   PHYSICAL EXAMINATION:    VITALS:   Vitals:   08/04/19 0813  BP: (!) 157/52  Pulse: (!) 55  SpO2: 98%  Weight: 169 lb (76.7 kg)  Height: 5\' 9"  (1.753 m)    GEN:  The patient appears  stated age and is in NAD. HEENT:  Normocephalic, atraumatic.  The mucous membranes are moist. The superficial temporal arteries are without ropiness or tenderness. CV:  RRR Lungs:  CTAB Neck/HEME:  There are no carotid bruits bilaterally.  Neurological examination:  Orientation: The patient is alert and oriented x3..   Cranial nerves: There is good facial symmetry with mild facial hypomimia.The speech is fluent and clear. Soft palate rises symmetrically and there is no tongue deviation. Hearing is intact to conversational tone. Sensation: Sensation is intact to light touch throughout Motor: Strength is at least antigravity x4.  Movement examination: Tone: There is normal tone in the UE/LE Abnormal movements:  no tremor.  No myoclonus.  No asterixis.   Coordination:  There is mild decremation on the L Gait and Station: The patient has no difficulty arising out of a deep-seated chair without the use of the hands. The patient's stride length is good.      Total time spent on today's visit was 30 minutes, including both face-to-face time and nonface-to-face time.  Time included that spent on review of records (prior notes available to me/labs/imaging if pertinent), discussing treatment and goals, answering patient's questions and coordinating care.  Cc:  Lawerance Cruel, MD

## 2019-08-04 ENCOUNTER — Encounter: Payer: Self-pay | Admitting: Neurology

## 2019-08-04 ENCOUNTER — Other Ambulatory Visit: Payer: Self-pay

## 2019-08-04 ENCOUNTER — Ambulatory Visit (INDEPENDENT_AMBULATORY_CARE_PROVIDER_SITE_OTHER): Payer: Medicare Other | Admitting: Neurology

## 2019-08-04 VITALS — BP 157/52 | HR 55 | Ht 69.0 in | Wt 169.0 lb

## 2019-08-04 DIAGNOSIS — G20 Parkinson's disease: Secondary | ICD-10-CM | POA: Diagnosis not present

## 2019-08-04 DIAGNOSIS — I671 Cerebral aneurysm, nonruptured: Secondary | ICD-10-CM

## 2019-08-04 NOTE — Patient Instructions (Addendum)
1.  As we discussed you have a 79mm aneurysm.  We can repeat the scan for this in 9 months or you can decide to get a 2nd opinion from neurosurgery or interventional radiology.  Call me and let me know 2.  You do not meet the criteria for Parkinsons Disease.  I do want to keep an eye out for this and will plan on following up with you in 9 months to 1 year. 3.  Exercise safely.  I prefer a stationary bike. 4.  Let me know if you want a referral for PT for balance.  The physicians and staff at [facility] are committed to providing excellent care. You may receive a survey requesting feedback about your experience at our office. We strive to receive "very good" responses to the survey questions. If you feel that your experience would prevent you from giving the office a "very good " response, please contact our office to try to remedy the situation. We may be reached at [phone]. Thank you for taking the time out of your busy day to complete the survey.

## 2019-08-08 ENCOUNTER — Other Ambulatory Visit: Payer: Self-pay

## 2019-08-08 DIAGNOSIS — I671 Cerebral aneurysm, nonruptured: Secondary | ICD-10-CM

## 2019-08-08 NOTE — Telephone Encounter (Signed)
Christy, please place order for consult with Dr. Estanislado Pandy, interventional radiology for 72mm cerebral aneurysm.

## 2019-08-08 NOTE — Telephone Encounter (Signed)
Ordered

## 2019-08-24 ENCOUNTER — Other Ambulatory Visit (HOSPITAL_COMMUNITY): Payer: Self-pay | Admitting: Interventional Radiology

## 2019-08-24 DIAGNOSIS — I671 Cerebral aneurysm, nonruptured: Secondary | ICD-10-CM

## 2019-09-22 ENCOUNTER — Ambulatory Visit: Payer: Medicare Other | Admitting: Neurology

## 2019-10-13 DIAGNOSIS — H43813 Vitreous degeneration, bilateral: Secondary | ICD-10-CM | POA: Diagnosis not present

## 2019-10-13 DIAGNOSIS — H524 Presbyopia: Secondary | ICD-10-CM | POA: Diagnosis not present

## 2019-10-13 DIAGNOSIS — H25013 Cortical age-related cataract, bilateral: Secondary | ICD-10-CM | POA: Diagnosis not present

## 2019-10-13 DIAGNOSIS — H5203 Hypermetropia, bilateral: Secondary | ICD-10-CM | POA: Diagnosis not present

## 2019-10-17 DIAGNOSIS — H9313 Tinnitus, bilateral: Secondary | ICD-10-CM | POA: Diagnosis not present

## 2019-10-17 DIAGNOSIS — H903 Sensorineural hearing loss, bilateral: Secondary | ICD-10-CM | POA: Diagnosis not present

## 2019-10-17 DIAGNOSIS — R42 Dizziness and giddiness: Secondary | ICD-10-CM | POA: Diagnosis not present

## 2019-11-02 DIAGNOSIS — R42 Dizziness and giddiness: Secondary | ICD-10-CM | POA: Diagnosis not present

## 2019-11-10 DIAGNOSIS — H903 Sensorineural hearing loss, bilateral: Secondary | ICD-10-CM | POA: Diagnosis not present

## 2019-11-10 DIAGNOSIS — R42 Dizziness and giddiness: Secondary | ICD-10-CM | POA: Diagnosis not present

## 2019-11-14 DIAGNOSIS — R2689 Other abnormalities of gait and mobility: Secondary | ICD-10-CM | POA: Diagnosis not present

## 2019-11-14 DIAGNOSIS — M2560 Stiffness of unspecified joint, not elsewhere classified: Secondary | ICD-10-CM | POA: Diagnosis not present

## 2019-12-08 DIAGNOSIS — Z23 Encounter for immunization: Secondary | ICD-10-CM | POA: Diagnosis not present

## 2019-12-13 DIAGNOSIS — L578 Other skin changes due to chronic exposure to nonionizing radiation: Secondary | ICD-10-CM | POA: Diagnosis not present

## 2019-12-13 DIAGNOSIS — L570 Actinic keratosis: Secondary | ICD-10-CM | POA: Diagnosis not present

## 2019-12-13 DIAGNOSIS — D692 Other nonthrombocytopenic purpura: Secondary | ICD-10-CM | POA: Diagnosis not present

## 2019-12-19 ENCOUNTER — Other Ambulatory Visit: Payer: Self-pay

## 2019-12-19 ENCOUNTER — Ambulatory Visit: Payer: Medicare Other | Attending: Otolaryngology | Admitting: Physical Therapy

## 2019-12-19 ENCOUNTER — Encounter: Payer: Self-pay | Admitting: Physical Therapy

## 2019-12-19 DIAGNOSIS — R42 Dizziness and giddiness: Secondary | ICD-10-CM | POA: Diagnosis not present

## 2019-12-19 DIAGNOSIS — R293 Abnormal posture: Secondary | ICD-10-CM | POA: Diagnosis not present

## 2019-12-19 DIAGNOSIS — R2689 Other abnormalities of gait and mobility: Secondary | ICD-10-CM | POA: Diagnosis not present

## 2019-12-19 DIAGNOSIS — R2681 Unsteadiness on feet: Secondary | ICD-10-CM | POA: Diagnosis not present

## 2019-12-19 NOTE — Patient Instructions (Signed)
    Tandem Stance  - PARTIAL HEEL TO TOE    Right foot in front of left, heel touching toe both feet "straight ahead". Stand on Foot Triangle of Support with both feet. Balance in this position _30__ seconds. Do with left foot in front of right.   SINGLE LIMB STANCE - hold onto something as needed    Stance: single leg on floor. Raise leg. Hold _10__ seconds. Repeat with other leg. _2__ reps per set, _2__ sets per day, __5_ days per week   Flexibility: Neck Retraction    Pull head straight back, keeping eyes and jaw level. Repeat _10__ times per set. Do _1-2__ sets per session. Do __2__ sessions per day.  [email or web address]

## 2019-12-20 NOTE — Therapy (Signed)
[facility] [address] [place], [place], [address] Phone: [phone]   Fax:  [phone]  Physical Therapy Evaluation  Patient Details  Name: Cody Dickerson MRN: [id number] Date of Birth: 1950-03-27 Referring Provider (PT): Dr. Leta Baptist   Encounter Date: 12/19/2019   PT End of Session - 12/20/19 2211    Visit Number 1    Number of Visits 9    Date for PT Re-Evaluation 01/20/20    Authorization Type Medicare/ AARP    Authorization Time Period 12-20-19 - 02-19-20    PT Start Time 0934    PT Stop Time 1028    PT Time Calculation (min) 54 min    Activity Tolerance Patient tolerated treatment well    Behavior During Therapy [facility] for tasks assessed/performed           Past Medical History:  Diagnosis Date  . Balance problem   . Gout   . Hypercholesterolemia   . Hypertension   . Small bowel obstruction [facility])     Past Surgical History:  Procedure Laterality Date  . BOWEL BLOCKAGE  05/2019    There were no vitals filed for this visit.        [facility] PT Assessment - 12/20/19 0001      Assessment   Medical Diagnosis Dizziness:  Imbalance    Referring Provider (PT) Dr. Leta Baptist    Onset Date/Surgical Date --   March 2019   Prior Therapy none      Balance Screen   Has the patient fallen in the past 6 months No    Has the patient had a decrease in activity level because of a fear of falling?  No    Is the patient reluctant to leave their home because of a fear of falling?  No      Home Ecologist residence    Home Access Stairs to enter    Entrance Stairs-Number of Steps 3    Entrance Stairs-Rails Can reach both    Savage One level      Prior Function   Level of Independence Independent      Observation/Other Assessments   Focus on Therapeutic Outcomes (FOTO)  Pt's physical FS measure 81/100; risk adjusted 66/100    Other Surveys  Dizziness Handicap Inventory  (DHI)    Dizziness Handicap Inventory (DHI)  16% (mild handicap)      Posture/Postural Control   Posture/Postural Control Postural limitations    Postural Limitations Rounded Shoulders;Forward head;Increased thoracic kyphosis    Posture Comments significant forward head posture      Standardized Balance Assessment   Standardized Balance Assessment Timed Up and Go Test      Timed Up and Go Test   Normal TUG (seconds) 9.97                  Vestibular Assessment - 12/20/19 0001      Vestibular Assessment   General Observation pt is a 69 yr old gentleman with chronic dizziness (started approx. 2 yrs ago) and imbalance; pt has been diagnosed with Parkinsonism by Dr. Carles Collet.  Per Dr. Deeann Saint notes, pt has chronic dizziness and imbalance likely secondary to both peripheral & central vestibular dysfunction      Symptom Behavior   Type of Dizziness  Unsteady with head/body turns;Imbalance;Lightheadedness    Frequency of Dizziness occurs most of the time - when sitting it does not seem to bother him  Duration of Dizziness varies depending on activity    Symptom Nature Variable;Motion provoked   bright lights seem to bother him    Aggravating Factors Activity in general;Looking up to the ceiling;Turning body quickly;Turning head quickly;Forward bending    Relieving Factors Rest;Comments;Lying supine   sitting    Progression of Symptoms Worse    History of similar episodes started initially approx. early 2019      Oculomotor Exam   Oculomotor Alignment Normal    Spontaneous Absent    Head shaking Horizontal Absent    Head Shaking Vertical Absent    Smooth Pursuits Comment   abnormal    Saccades Intact    Comment abnormal ocular motility tests suggest central involvement per Dr. Deeann Saint notes      Visual Acuity   Static line 8    Dynamic line 6          DVA test - 2 line difference - WNL's    Objective measurements completed on examination: See above findings.     Romberg EO 30 secs:  EC 30 secs Sharpened Romberg EO 10.87 secs;  EC 3.15 secs  SLS RLE 2.63 secs;  LLE .88 secs           PT Education - 12/20/19 2208    Education Details pt instructed in HEP - tandem, SLS and cervical retraction; also discussed eval results with pt    Person(s) Educated Patient    Methods Explanation    Comprehension Verbalized understanding            PT Short Term Goals - 12/20/19 2235      PT SHORT TERM GOAL #1   Title same as LTG's             PT Long Term Goals - 12/20/19 2242      PT LONG TERM GOAL #1   Title Pt will improve DHI score from 16% to </= 10% to demo improvement in dizziness.    Baseline 16%    Time 4    Period Weeks    Status New    Target Date 01/20/20      PT LONG TERM GOAL #2   Title Improve SOT composite score by at least 10 points to demo improved balance for reduced fall risk.    Baseline SOT to be assessed    Time 4    Period Weeks    Status New    Target Date 01/20/20      PT LONG TERM GOAL #3   Title Pt will be independent in HEP for balance/vestibular exercises and postural exercises.    Time 4    Period Weeks    Status New    Target Date 01/20/20      PT LONG TERM GOAL #4   Title Improve FGA by at least 4 points to demo improved balance with walking.    Time 4    Period Weeks    Status New    Target Date 01/20/20                  Plan - 12/20/19 2219    Clinical Impression Statement Pt is a 69 yr old gentleman with c/o 2 yr h/o chronic dizziness and imbalance; pt has been diagnosed with Parkinsonism and Lt distal ICA aneurysm by Dr. Carles Collet.  Pt has both central and peripheral vestibular weakness per Dr. Deeann Saint notes.  Pt presents with decreased high level balance skills including SLS and tandem stance.  Pt also presents with bradykinesia and significant forward head posture.  Pt will benefit from balance and gait training and vestibular rehab exercises as well as postural retraining and  stretches.    Personal Factors and Comorbidities Comorbidity 2;Time since onset of injury/illness/exacerbation;Past/Current Experience    Comorbidities h/o small bowel obstruction, HTN, Parkinsonism, Lt distal ICA aneurysm    Examination-Activity Limitations Locomotion Level;Bend;Squat;Transfers;Reach Overhead    Examination-Participation Restrictions Yard Work;Meal Prep;Shop;Community Activity;Interpersonal Relationship;Other   playing golf   Stability/Clinical Decision Making Evolving/Moderate complexity    Clinical Decision Making Moderate    Rehab Potential Good    PT Frequency 2x / week    PT Duration 4 weeks   will reassess and continue if pt progressing   PT Treatment/Interventions ADLs/Self Care Home Management;Balance training;Vestibular;Gait training;Therapeutic exercise;Therapeutic activities;Neuromuscular re-education;Patient/family education    PT Next Visit Plan Do SOT; give exercises for HEP as appropriate based on results; do FGA    PT Home Exercise Plan tandem, SLS & cervical retraction issued on 12-20-19    Consulted and Agree with Plan of Care Patient           Patient will benefit from skilled therapeutic intervention in order to improve the following deficits and impairments:  Abnormal gait, Dizziness, Decreased balance, Impaired vision/preception, Postural dysfunction  Visit Diagnosis: Dizziness and giddiness - Plan: PT plan of care cert/re-cert  Other abnormalities of gait and mobility - Plan: PT plan of care cert/re-cert  Abnormal posture - Plan: PT plan of care cert/re-cert  Unsteadiness on feet - Plan: PT plan of care cert/re-cert     Problem List Patient Active Problem List   Diagnosis Date Noted  . SBO (small bowel obstruction) ([place]) 07/06/2019    Puneet Masoner, [place], PT 12/20/2019, 10:52 PM  [facility] [address] [place], [place], [address] Phone: [phone]   Fax:   [phone]  Name: REDDING CLOE MRN: [id number] Date of Birth: 05-Oct-1950

## 2019-12-22 ENCOUNTER — Ambulatory Visit: Payer: Medicare Other

## 2019-12-22 ENCOUNTER — Other Ambulatory Visit: Payer: Self-pay

## 2019-12-22 DIAGNOSIS — R2681 Unsteadiness on feet: Secondary | ICD-10-CM | POA: Diagnosis not present

## 2019-12-22 DIAGNOSIS — R293 Abnormal posture: Secondary | ICD-10-CM | POA: Diagnosis not present

## 2019-12-22 DIAGNOSIS — R42 Dizziness and giddiness: Secondary | ICD-10-CM

## 2019-12-22 DIAGNOSIS — R2689 Other abnormalities of gait and mobility: Secondary | ICD-10-CM

## 2019-12-22 NOTE — Patient Instructions (Signed)
Access Code: [email or web address] URL: [email or web address] Date: 12/22/2019 Prepared by: Baldomero Lamy  Exercises Supine Chin Tuck - 1 x daily - 5 x weekly - 2 sets - 10 reps Romberg Stance on Foam Pad with Head Rotation - 1 x daily - 5 x weekly - 2 sets - 10 reps

## 2019-12-22 NOTE — Therapy (Signed)
[facility] [address] [place], [place], [address] Phone: [phone]   Fax:  [phone]  Physical Therapy Treatment  Patient Details  Name: Cody Dickerson MRN: [id number] Date of Birth: 12-19-1950 Referring Provider (PT): Dr. Leta Baptist   Encounter Date: 12/22/2019   PT End of Session - 12/22/19 0939    Visit Number 2    Number of Visits 9    Date for PT Re-Evaluation 01/20/20    Authorization Type Medicare/ AARP    Authorization Time Period 12-20-19 - 02-19-20    PT Start Time 0933    PT Stop Time 1015    PT Time Calculation (min) 42 min    Activity Tolerance Patient tolerated treatment well    Behavior During Therapy [facility] for tasks assessed/performed           Past Medical History:  Diagnosis Date  . Balance problem   . Gout   . Hypercholesterolemia   . Hypertension   . Small bowel obstruction [facility])     Past Surgical History:  Procedure Laterality Date  . BOWEL BLOCKAGE  05/2019    There were no vitals filed for this visit.   Subjective Assessment - 12/22/19 0937    Subjective Patient report that he is not quite sure he is doing his exercises correctly. No falls since last visit. Patient reports mild dizziness.   reports crepitus in his neck at times and feels a tightness in upper back/ shoulders   Pertinent History Parkinsonism, Lt distal ICA aneurysm, HTN, chronic dizziness and imbalance    Diagnostic tests DaTscan, CT angiogram ; abnormal ocular motility test per Dr. Benjamine Mola    Patient Stated Goals "hopefully to get steady on my feet"              [facility] PT Assessment - 12/22/19 0001      Functional Gait  Assessment   Gait assessed  Yes    Gait Level Surface Walks 20 ft in less than 7 sec but greater than 5.5 sec, uses assistive device, slower speed, mild gait deviations, or deviates 6-10 in outside of the 12 in walkway width.    Change in Gait Speed Able to smoothly change walking speed  without loss of balance or gait deviation. Deviate no more than 6 in outside of the 12 in walkway width.    Gait with Horizontal Head Turns Performs head turns with moderate changes in gait velocity, slows down, deviates 10-15 in outside 12 in walkway width but recovers, can continue to walk.    Gait with Vertical Head Turns Performs task with slight change in gait velocity (eg, minor disruption to smooth gait path), deviates 6 - 10 in outside 12 in walkway width or uses assistive device    Gait and Pivot Turn Pivot turns safely in greater than 3 sec and stops with no loss of balance, or pivot turns safely within 3 sec and stops with mild imbalance, requires small steps to catch balance.    Step Over Obstacle Is able to step over one shoe box (4.5 in total height) without changing gait speed. No evidence of imbalance.    Gait with Narrow Base of Support Ambulates less than 4 steps heel to toe or cannot perform without assistance.    Gait with Eyes Closed Walks 20 ft, slow speed, abnormal gait pattern, evidence for imbalance, deviates 10-15 in outside 12 in walkway width. Requires more than 9 sec to ambulate 20 ft.  Ambulating Backwards Walks 20 ft, uses assistive device, slower speed, mild gait deviations, deviates 6-10 in outside 12 in walkway width.    Steps Alternating feet, must use rail.    Total Score 17    FGA comment: 17/30 = High Fall Risk               [facility] Adult PT Treatment/Exercise - 12/22/19 0001      Ambulation/Gait   Ambulation/Gait Yes    Ambulation/Gait Assistance 5: Supervision    Ambulation/Gait Assistance Details througohut therapy gym with activities    Assistive device None    Ambulation Surface Level;Indoor      Exercises   Exercises Other Exercises    Other Exercises  Reviewed cervical retraction with patient as reports that he does not feel his is doing it appropriately. Paitent demo difficulty isolating cervical retraction often completing cervical extension  in addition. Completed with patient in supine x 10 reps, patient still demo difficulty initially but technique/form improved with increased reps.                Balance Exercises - 12/22/19 0001      Balance Exercises: Standing   Standing Eyes Opened Narrow base of support (BOS);Foam/compliant surface;Head turns;Limitations    Standing Eyes Opened Limitations standing on 1" foam completed horizontal/vertical head turns, x 10 reps.     Tandem Stance Eyes open;Intermittent upper extremity support;3 reps;30 secs;Limitations    Tandem Stance Time completed tandem stance alteranting foot position progressing to no UE support, 2  x 30 secs each completed.     SLS Eyes open;Solid surface;Intermittent upper extremity support;Time;2 reps    SLS Time 10-15 secs. Increased time with stance on RLE > LLE. increased balance challenge with stnace on LLE noted requiring intermittent UE support    Other Standing Exercises Comments Educating patient to complete balance exercises in a corner with chair in front for safety.              PT Education - 12/22/19 1016    Education Details HEP Review/Update (patient difficulty with seated retraction, educated on supine retraction)    Person(s) Educated Patient    Methods Explanation;Handout    Comprehension Verbalized understanding;Returned demonstration;Need further instruction            PT Short Term Goals - 12/20/19 2235      PT SHORT TERM GOAL #1   Title same as LTG's             PT Long Term Goals - 12/20/19 2242      PT LONG TERM GOAL #1   Title Pt will improve DHI score from 16% to </= 10% to demo improvement in dizziness.    Baseline 16%    Time 4    Period Weeks    Status New    Target Date 01/20/20      PT LONG TERM GOAL #2   Title Improve SOT composite score by at least 10 points to demo improved balance for reduced fall risk.    Baseline SOT to be assessed    Time 4    Period Weeks    Status New    Target Date  01/20/20      PT LONG TERM GOAL #3   Title Pt will be independent in HEP for balance/vestibular exercises and postural exercises.    Time 4    Period Weeks    Status New    Target Date 01/20/20  PT LONG TERM GOAL #4   Title Improve FGA by at least 4 points to demo improved balance with walking.    Time 4    Period Weeks    Status New    Target Date 01/20/20                 Plan - 12/22/19 1023    Clinical Impression Statement Today's skilled PT session included assessment of patient's dynamic balance with completion of FGA, patient scoring 17/30 today demonstrating high risk for falls. Increased difficulty noted with horizontal head turns, gait with vision removed, and tandem walking. Reviewed HEP per patient's request, increased difficulty noted with cervical retraction, educated patient to complete in supine at this time. Will continue to progress toward all goals.    Personal Factors and Comorbidities Comorbidity 2;Time since onset of injury/illness/exacerbation;Past/Current Experience    Comorbidities h/o small bowel obstruction, HTN, Parkinsonism, Lt distal ICA aneurysm    Examination-Activity Limitations Locomotion Level;Bend;Squat;Transfers;Reach Overhead    Examination-Participation Restrictions Yard Work;Meal Prep;Shop;Community Activity;Interpersonal Relationship;Other   playing golf   Stability/Clinical Decision Making Evolving/Moderate complexity    Rehab Potential Good    PT Frequency 2x / week    PT Duration 4 weeks   will reassess and continue if pt progressing   PT Treatment/Interventions ADLs/Self Care Home Management;Balance training;Vestibular;Gait training;Therapeutic exercise;Therapeutic activities;Neuromuscular re-education;Patient/family education    PT Next Visit Plan Complete SOT; give exercises for HEP as appropriate based on results; review HEP as needed (specifically cervical retractions)    PT Home Exercise Plan tandem, SLS & cervical retraction  issued on 12-20-19; Medbridge Code: [email or web address]    Consulted and Agree with Plan of Care Patient           Patient will benefit from skilled therapeutic intervention in order to improve the following deficits and impairments:  Abnormal gait, Dizziness, Decreased balance, Impaired vision/preception, Postural dysfunction  Visit Diagnosis: Dizziness and giddiness  Other abnormalities of gait and mobility  Abnormal posture  Unsteadiness on feet     Problem List Patient Active Problem List   Diagnosis Date Noted  . SBO (small bowel obstruction) ([place]) 07/06/2019    Jones Bales, PT, DPT 12/22/2019, 10:27 AM  [facility] [address] [place], [place], [address] Phone: [phone]   Fax:  [phone]  Name: Cody Dickerson MRN: [id number] Date of Birth: Aug 12, 1950

## 2019-12-26 ENCOUNTER — Encounter: Payer: Self-pay | Admitting: Physical Therapy

## 2019-12-26 ENCOUNTER — Other Ambulatory Visit: Payer: Self-pay

## 2019-12-26 ENCOUNTER — Ambulatory Visit: Payer: Medicare Other | Admitting: Physical Therapy

## 2019-12-26 DIAGNOSIS — R293 Abnormal posture: Secondary | ICD-10-CM | POA: Diagnosis not present

## 2019-12-26 DIAGNOSIS — R2681 Unsteadiness on feet: Secondary | ICD-10-CM

## 2019-12-26 DIAGNOSIS — R2689 Other abnormalities of gait and mobility: Secondary | ICD-10-CM | POA: Diagnosis not present

## 2019-12-26 DIAGNOSIS — R42 Dizziness and giddiness: Secondary | ICD-10-CM | POA: Diagnosis not present

## 2019-12-26 NOTE — Therapy (Signed)
[facility] [address] [place], [place], [address] Phone: [phone]   Fax:  [phone]  Physical Therapy Treatment  Patient Details  Name: MAXIMINO COZZOLINO MRN: [id number] Date of Birth: January 15, 1951 Referring Provider (PT): Dr. Leta Baptist   Encounter Date: 12/26/2019   PT End of Session - 12/26/19 2021    Visit Number 3    Number of Visits 9    Date for PT Re-Evaluation 01/20/20    Authorization Type Medicare/ AARP    Authorization Time Period 12-20-19 - 02-19-20    PT Start Time 0935    PT Stop Time 1020    PT Time Calculation (min) 45 min    Equipment Utilized During Treatment Other (comment)   vest harness used on SOT   Activity Tolerance Patient tolerated treatment well    Behavior During Therapy [facility] for tasks assessed/performed           Past Medical History:  Diagnosis Date  . Balance problem   . Gout   . Hypercholesterolemia   . Hypertension   . Small bowel obstruction [facility])     Past Surgical History:  Procedure Laterality Date  . BOWEL BLOCKAGE  05/2019    There were no vitals filed for this visit.   Subjective Assessment - 12/26/19 2011    Subjective Patient reports doing exercises at home; reports the cervical retraction exercise is easier to do lying down (instructed by Guillermina [place], PT in previous session) than doing exercise in seated position   reports crepitus in his neck at times and feels a tightness in upper back/ shoulders   Pertinent History Parkinsonism, Lt distal ICA aneurysm, HTN, chronic dizziness and imbalance    Diagnostic tests DaTscan, CT angiogram ; abnormal ocular motility test per Dr. Benjamine Mola    Patient Stated Goals "hopefully to get steady on my feet"    Currently in Pain? No/denies              Sensory Organization Test:  Composite score 77/100  N=70/100  Somatosensory, visual and vestibular inputs are all WNL's  Condition 1 - all 3 trials WNL's Condition 2 -  all 3 trials WNL's Condition 3 - all 3 trials WNL's Condition 4 - trial 1 below N (slightly):  Trials 2 and 3 WNL's Condition 5 - all 3 trials WNL's Condition 6 - trial 1 decreased by approx. 10 points;  Trials 2 and 3 WNL's                [facility] Adult PT Treatment/Exercise - 12/26/19 0001      Transfers   Transfers Sit to Stand;Stand to Sit    Sit to Stand 4: Min guard    Number of Reps 10 reps    Comments 1st 5 reps EO; 2nd 5 reps EC - feet on floor for all 10 reps      Exercises   Exercises Shoulder      Shoulder Exercises: Stretch   Corner Stretch 1 rep;30 seconds   "W" stretch for pectorals   Wall Stretch - Flexion 1 rep;30 seconds   "Y" stretch              Balance Exercises - 12/26/19 0001      Balance Exercises: Standing   Standing Eyes Opened Wide (BOA);Head turns;Foam/compliant surface;5 reps   horizontal and vertical stretch   Standing Eyes Opened Limitations pt stood on 2 pillows in corner - CGA - used walls with LOB posteriorly  Standing Eyes Closed Wide (BOA);Head turns;Foam/compliant surface;5 reps   horizontal and vertical head turns            PT Education - 12/26/19 2019    Education Details educated pt in results of SOT; added corner stretches ("W" & "Y") to HEP for postural retraining    Person(s) Educated Patient    Methods Explanation;Demonstration;Handout    Comprehension Verbalized understanding;Returned demonstration            PT Short Term Goals - 12/20/19 2235      PT SHORT TERM GOAL #1   Title same as LTG's             PT Long Term Goals - 12/26/19 2031      PT LONG TERM GOAL #1   Title Pt will improve DHI score from 16% to </= 10% to demo improvement in dizziness.    Baseline 16%    Time 4    Period Weeks    Status New      PT LONG TERM GOAL #2   Title Improve SOT composite score by at least 10 points to demo improved balance for reduced fall risk.    Baseline SOT to be assessed    Time 4    Period  Weeks    Status New      PT LONG TERM GOAL #3   Title Pt will be independent in HEP for balance/vestibular exercises and postural exercises.    Time 4    Period Weeks    Status New      PT LONG TERM GOAL #4   Title Improve FGA by at least 4 points to demo improved balance with walking.    Time 4    Period Weeks    Status New                 Plan - 12/26/19 0941    Clinical Impression Statement Pt's composite score of SOT is 77/100 (WNL's) as N= 70/100.  Pt's somatosensory, visual and vestibular inputs are all WNL's.  Pt did have some mild LOB with standing on compliant surface with head turns with EC, with vertical head turns resulting in > LOB than horizontal.  Pt reported no significant dizziness in today's session.  Cont with POC.    Personal Factors and Comorbidities Comorbidity 2;Time since onset of injury/illness/exacerbation;Past/Current Experience    Comorbidities h/o small bowel obstruction, HTN, Parkinsonism, Lt distal ICA aneurysm    Examination-Activity Limitations Locomotion Level;Bend;Squat;Transfers;Reach Overhead    Examination-Participation Restrictions Yard Work;Meal Prep;Shop;Community Activity;Interpersonal Relationship;Other   playing golf   Stability/Clinical Decision Making Evolving/Moderate complexity    Rehab Potential Good    PT Frequency 2x / week    PT Duration 4 weeks   will reassess and continue if pt progressing   PT Treatment/Interventions ADLs/Self Care Home Management;Balance training;Vestibular;Gait training;Therapeutic exercise;Therapeutic activities;Neuromuscular re-education;Patient/family education    PT Next Visit Plan added standing with EC on pillows to HEP - please check to see how these exs are going for him at home;  cont with dynamic gait and balance with vestibular input incorporated (try amb. tossing ball); SLS activities    PT Home Exercise Plan tandem, SLS & cervical retraction issued on 12-20-19; Medbridge Code: [id number]     Consulted and Agree with Plan of Care Patient           Patient will benefit from skilled therapeutic intervention in order to improve the following deficits and impairments:  Abnormal gait,  Dizziness, Decreased balance, Impaired vision/preception, Postural dysfunction  Visit Diagnosis: Other abnormalities of gait and mobility  Unsteadiness on feet     Problem List Patient Active Problem List   Diagnosis Date Noted  . SBO (small bowel obstruction) ([place]) 07/06/2019    Dilday, Jenness Corner, PT 12/26/2019, 8:33 PM  [facility] [address] [place], [place], [address] Phone: [phone]   Fax:  [phone]  Name: ELDRIGE PITKIN MRN: [id number] Date of Birth: [birth year]

## 2019-12-26 NOTE — Patient Instructions (Signed)
Flexibility: Corner Stretch    Standing in corner with hands just above shoulder level and feet _10___ inches from corner, lean forward until a comfortable stretch is felt across chest. Hold __30__ seconds. Repeat __1-2__ times per set. Do _1___ sets per session. Do _1-2__ sessions per day.  "Y" STRETCH - REACH BOTH ARMS UP ON WALL - HOLD 30 SECS - 1-2 TIMES/DAY

## 2019-12-30 ENCOUNTER — Ambulatory Visit: Payer: Medicare Other

## 2019-12-30 ENCOUNTER — Other Ambulatory Visit: Payer: Self-pay

## 2019-12-30 DIAGNOSIS — R42 Dizziness and giddiness: Secondary | ICD-10-CM

## 2019-12-30 DIAGNOSIS — R293 Abnormal posture: Secondary | ICD-10-CM | POA: Diagnosis not present

## 2019-12-30 DIAGNOSIS — R2689 Other abnormalities of gait and mobility: Secondary | ICD-10-CM | POA: Diagnosis not present

## 2019-12-30 DIAGNOSIS — R2681 Unsteadiness on feet: Secondary | ICD-10-CM

## 2019-12-30 NOTE — Therapy (Signed)
[facility] [address] [place], [place], [address] Phone: [phone]   Fax:  [phone]  Physical Therapy Treatment  Patient Details  Name: Cody Dickerson MRN: [id number] Date of Birth: 08-Oct-1950 Referring Provider (PT): Dr. Leta Baptist   Encounter Date: 12/30/2019   PT End of Session - 12/30/19 1448    Visit Number 4    Number of Visits 9    Date for PT Re-Evaluation 01/20/20    Authorization Type Medicare/ AARP    Authorization Time Period 12-20-19 - 02-19-20    PT Start Time 1446    PT Stop Time 1530    PT Time Calculation (min) 44 min    Equipment Utilized During Treatment Gait belt    Activity Tolerance Patient tolerated treatment well    Behavior During Therapy [name] for tasks assessed/performed           Past Medical History:  Diagnosis Date  . Balance problem   . Gout   . Hypercholesterolemia   . Hypertension   . Small bowel obstruction [facility])     Past Surgical History:  Procedure Laterality Date  . BOWEL BLOCKAGE  05/2019    There were no vitals filed for this visit.   Subjective Assessment - 12/30/19 1450    Subjective Patient reports that exercises going well, has not had chance to complete Y/W stretch. No other new issues/complaints. No falls.   reports crepitus in his neck at times and feels a tightness in upper back/ shoulders   Pertinent History Parkinsonism, Lt distal ICA aneurysm, HTN, chronic dizziness and imbalance    Diagnostic tests DaTscan, CT angiogram ; abnormal ocular motility test per Dr. Benjamine Mola    Patient Stated Goals "hopefully to get steady on my feet"    Currently in Pain? No/denies                    [facility] Adult PT Treatment/Exercise - 12/30/19 1452      Transfers   Transfers Sit to Stand;Stand to Sit    Sit to Stand 5: Supervision;4: Min guard    Stand to Sit 5: Supervision;4: Min guard    Number of Reps 10 reps    Comments completed x 10 reps with BLE on  foam surface without UE support.       Ambulation/Gait   Ambulation/Gait Yes    Ambulation/Gait Assistance 5: Supervision;4: Min guard    Ambulation/Gait Assistance Details ambulation with high level balance    Assistive device None    Ambulation Surface Level;Indoor      High Level Balance   High Level Balance Activities Head turns    High Level Balance Comments Completed ambulation 2 x 100 ft of horizontal head turns, and 2 x 100 ft of vertical head turns. patient demo increased imbalance with L > R head turns, as well as Up > Down. One instances of imbalance with L head turn, require Min A from PT to catch balance.       Neuro Re-ed    Neuro Re-ed Details  Completed ambulation with ball toss 4 x 100 ft with visual tracking. CGA required throughout.       Exercises   Exercises Other Exercises    Other Exercises  Reviewed completion of Y/W stretches to ensure proper technique. Patient reporting difficulty finding corner to complete stretches in. Completed 2 x 30 seconds each today using door frame and educated to try this at home to allow for  proper completion.                Balance Exercises - 12/30/19 1540      Balance Exercises: Standing   Standing Eyes Opened Wide (BOA);Head turns;Foam/compliant surface;5 reps    Standing Eyes Opened Limitations completed with airex pad, intermittent CGA. verbal cues to keep trunk neutral and complete pure head rotation, tactile cues provided at shoulders.     Standing Eyes Closed Wide (BOA);Foam/compliant surface;3 reps;30 secs    Tandem Stance Eyes open;Intermittent upper extremity support;3 reps;30 secs;Limitations;Eyes closed    Tandem Stance Time 30 secs; completed initially x 2 reps with EO, then progressed to completing with EC 2 x 15-20 seconds. Increased challenge noted with EC.     Tandem Gait Forward;Intermittent upper extremity support;3 reps;Limitations    Tandem Gait Limitations completed x 3 laps, down and back in // bars. CGA  required.                PT Short Term Goals - 12/20/19 2235      PT SHORT TERM GOAL #1   Title same as LTG's             PT Long Term Goals - 12/26/19 2031      PT LONG TERM GOAL #1   Title Pt will improve DHI score from 16% to </= 10% to demo improvement in dizziness.    Baseline 16%    Time 4    Period Weeks    Status New      PT LONG TERM GOAL #2   Title Improve SOT composite score by at least 10 points to demo improved balance for reduced fall risk.    Baseline SOT to be assessed    Time 4    Period Weeks    Status New      PT LONG TERM GOAL #3   Title Pt will be independent in HEP for balance/vestibular exercises and postural exercises.    Time 4    Period Weeks    Status New      PT LONG TERM GOAL #4   Title Improve FGA by at least 4 points to demo improved balance with walking.    Time 4    Period Weeks    Status New                 Plan - 12/30/19 1544    Clinical Impression Statement Reviewed Y/W stretches and educated on completion in door frame due to difficulty locating corner. Continued review of balance exercsies for HEP, as well as progression of balance exercises as tolerated. With head turns during ambulation, patient had one instance of LOB requiring assistance from PT. No dizziness reported, just imbalance. Will continue to progress toward all goals and continue with POC.    Personal Factors and Comorbidities Comorbidity 2;Time since onset of injury/illness/exacerbation;Past/Current Experience    Comorbidities h/o small bowel obstruction, HTN, Parkinsonism, Lt distal ICA aneurysm    Examination-Activity Limitations Locomotion Level;Bend;Squat;Transfers;Reach Overhead    Examination-Participation Restrictions Yard Work;Meal Prep;Shop;Community Activity;Interpersonal Relationship;Other   playing golf   Stability/Clinical Decision Making Evolving/Moderate complexity    Rehab Potential Good    PT Frequency 2x / week    PT Duration 4  weeks   will reassess and continue if pt progressing   PT Treatment/Interventions ADLs/Self Care Home Management;Balance training;Vestibular;Gait training;Therapeutic exercise;Therapeutic activities;Neuromuscular re-education;Patient/family education    PT Next Visit Plan did we try Y/w stretches in door frame? continue with  dynamic gait/balance (focus on vestibular input), SLS activities.    PT Home Exercise Plan tandem, SLS & cervical retraction issued on 12-20-19; Medbridge Code: [id number]    Consulted and Agree with Plan of Care Patient           Patient will benefit from skilled therapeutic intervention in order to improve the following deficits and impairments:  Abnormal gait, Dizziness, Decreased balance, Impaired vision/preception, Postural dysfunction  Visit Diagnosis: Other abnormalities of gait and mobility  Unsteadiness on feet  Dizziness and giddiness  Abnormal posture     Problem List Patient Active Problem List   Diagnosis Date Noted  . SBO (small bowel obstruction) ([place]) 07/06/2019    Jones Bales, PT, DPT 12/30/2019, 3:50 PM  [facility] [address] [place], [place], [address] Phone: [phone]   Fax:  [phone]  Name: Cody Dickerson MRN: [id number] Date of Birth: [birth year]

## 2019-12-31 ENCOUNTER — Ambulatory Visit: Payer: Self-pay | Attending: Internal Medicine

## 2019-12-31 DIAGNOSIS — Z23 Encounter for immunization: Secondary | ICD-10-CM

## 2019-12-31 NOTE — Progress Notes (Signed)
   Covid-19 Vaccination Clinic  Name:  Cody Dickerson    MRN: [id number] DOB: 1950/10/06  12/31/2019  [name] was observed post Covid-19 immunization for 15 minutes without incident. He was provided with Vaccine Information Sheet and instruction to access the V-Safe system.   [name] was instructed to call 911 with any severe reactions post vaccine: Marland Kitchen Difficulty breathing  . Swelling of face and throat  . A fast heartbeat  . A bad rash all over body  . Dizziness and weakness

## 2020-01-02 ENCOUNTER — Other Ambulatory Visit: Payer: Self-pay

## 2020-01-02 ENCOUNTER — Ambulatory Visit: Payer: Medicare Other | Attending: Otolaryngology | Admitting: Physical Therapy

## 2020-01-02 DIAGNOSIS — R2681 Unsteadiness on feet: Secondary | ICD-10-CM | POA: Insufficient documentation

## 2020-01-02 DIAGNOSIS — R2689 Other abnormalities of gait and mobility: Secondary | ICD-10-CM | POA: Diagnosis not present

## 2020-01-02 DIAGNOSIS — R293 Abnormal posture: Secondary | ICD-10-CM | POA: Diagnosis not present

## 2020-01-02 DIAGNOSIS — R42 Dizziness and giddiness: Secondary | ICD-10-CM | POA: Insufficient documentation

## 2020-01-02 NOTE — Therapy (Signed)
[facility] [address] [place], [place], [address] Phone: [phone]   Fax:  [phone]  Physical Therapy Treatment  Patient Details  Name: Cody Dickerson MRN: [id number] Date of Birth: [birth year] Referring Provider (PT): Dr. Leta Baptist   Encounter Date: 01/02/2020   PT End of Session - 01/02/20 0925    Visit Number 5    Number of Visits 9    Date for PT Re-Evaluation 01/20/20    Authorization Type Medicare/ AARP    Authorization Time Period 12-20-19 - 02-19-20    PT Start Time 0929    PT Stop Time 1015    PT Time Calculation (min) 46 min    Equipment Utilized During Treatment Gait belt    Activity Tolerance Patient tolerated treatment well    Behavior During Therapy [facility] for tasks assessed/performed           Past Medical History:  Diagnosis Date  . Balance problem   . Gout   . Hypercholesterolemia   . Hypertension   . Small bowel obstruction [facility])     Past Surgical History:  Procedure Laterality Date  . BOWEL BLOCKAGE  05/2019    There were no vitals filed for this visit.   Subjective Assessment - 01/02/20 0927    Subjective Pt states he did the "Y" and "W" stretches at home - asks if he is supposed to alternate stepping forward with each foot (doing stretch in doorway)- "or does it matter?"   reports crepitus in his neck at times and feels a tightness in upper back/ shoulders   Pertinent History Parkinsonism, Lt distal ICA aneurysm, HTN, chronic dizziness and imbalance    Diagnostic tests DaTscan, CT angiogram ; abnormal ocular motility test per Dr. Benjamine Mola    Patient Stated Goals "hopefully to get steady on my feet"    Currently in Pain? No/denies                             [facility] Adult PT Treatment/Exercise - 01/02/20 1921      Transfers   Transfers Sit to Stand;Stand to Sit    Sit to Stand 5: Supervision    Stand to Sit 5: Supervision    Number of Reps 10 reps    5 reps feet  on floor; 5 reps feet on Airex - EO   Comments Pt performed 5 reps sit to stand with feet on floor with EC - no UE support used      Ambulation/Gait   Ambulation/Gait Yes    Ambulation/Gait Assistance 5: Supervision    Ambulation/Gait Assistance Details noodle placed behind pt's back to facilitate upright posture - 3 laps (350')     Assistive device None    Ambulation Surface Level;Indoor    Gait Comments Noodle removed after 350' - pt gait trained around track 230' for carryover of upright posture without use of noodle - cues to look straight ahead rather than looking down at floor ; cues for initial heel contact       Shoulder Exercises: Stretch   Corner Stretch 1 rep;30 seconds   performed in doorway               Balance Exercises - 01/02/20 0956      Balance Exercises: Standing   Standing Eyes Opened Wide (BOA);Head turns;Foam/compliant surface;5 reps   horizontal and vertical head turns   Standing Eyes Closed Wide (BOA);Head turns;Foam/compliant surface;5  reps   horizontal and vertical head turns   Rockerboard Anterior/posterior;EO;EC;10 reps    Partial Tandem Stance Eyes open;5 reps   standing on floor - horizontal and vertical head turns    Step Over Hurdles / Cones stepping over balance beam inside // bars 5 reps each foot     Other Standing Exercises Pt performed amb. 34' tossing and catching ball (straight up in front) ; then tossing Rt/Lt sides and catching - CGA only as pt had not LOB;  amb. 30' backward tossing and catching ball straight up with CGA     Other Standing Exercises Comments pt stood on rocker board inside // bars - head turns horizontal and vertical 5 reps each direction with EO and then with EC - horizontal and vertical               PT Short Term Goals - 12/20/19 2235      PT SHORT TERM GOAL #1   Title same as LTG's             PT Long Term Goals - 01/02/20 1950      PT LONG TERM GOAL #1   Title Pt will improve DHI score from 16% to </=  10% to demo improvement in dizziness.    Baseline 16%    Time 4    Period Weeks    Status New      PT LONG TERM GOAL #2   Title Improve SOT composite score by at least 10 points to demo improved balance for reduced fall risk.    Baseline SOT to be assessed    Time 4    Period Weeks    Status New      PT LONG TERM GOAL #3   Title Pt will be independent in HEP for balance/vestibular exercises and postural exercises.    Time 4    Period Weeks    Status New      PT LONG TERM GOAL #4   Title Improve FGA by at least 4 points to demo improved balance with walking.    Time 4    Period Weeks    Status New                 Plan - 01/02/20 1941    Clinical Impression Statement Pt able to maintain balance on compliant and noncompliant surfaces with minimal UE support.  Pt had no LOB with amb. tossing ball, demonstrating ability to perform multi-tasking with gait without excessive difficulty.  Pt's posture improved after use of noodle to faciliate trunk extension and with cues to loof straight ahead rather than down at floor.  Pt states he has most difficulty maintaining balance with sudden change in surface, without anticipation of the change.  Pt is progressing well - cont with POC.    Personal Factors and Comorbidities Comorbidity 2;Time since onset of injury/illness/exacerbation;Past/Current Experience    Comorbidities h/o small bowel obstruction, HTN, Parkinsonism, Lt distal ICA aneurysm    Examination-Activity Limitations Locomotion Level;Bend;Squat;Transfers;Reach Overhead    Examination-Participation Restrictions Yard Work;Meal Prep;Shop;Community Activity;Interpersonal Relationship;Other   playing golf   Stability/Clinical Decision Making Evolving/Moderate complexity    Rehab Potential Good    PT Frequency 2x / week    PT Duration 4 weeks   will reassess and continue if pt progressing   PT Treatment/Interventions ADLs/Self Care Home Management;Balance training;Vestibular;Gait  training;Therapeutic exercise;Therapeutic activities;Neuromuscular re-education;Patient/family education    PT Next Visit Plan Jerene Pitch - please do gait training outside  on uneven paved surfaces - incline, decline, etc.:  continue with dynamic gait/balance (focus on vestibular input), SLS activities.    PT Home Exercise Plan tandem, SLS & cervical retraction issued on 12-20-19; Medbridge Code: [id number]    Consulted and Agree with Plan of Care Patient           Patient will benefit from skilled therapeutic intervention in order to improve the following deficits and impairments:  Abnormal gait, Dizziness, Decreased balance, Impaired vision/preception, Postural dysfunction  Visit Diagnosis: Other abnormalities of gait and mobility  Unsteadiness on feet  Abnormal posture     Problem List Patient Active Problem List   Diagnosis Date Noted  . SBO (small bowel obstruction) ([place]) 07/06/2019    Dilday, Jenness [place], PT 01/02/2020, 7:54 PM  [facility] [address] [place], [place], [address] Phone: [phone]   Fax:  [phone]  Name: Cody Dickerson MRN: [id number] Date of Birth: 1950/11/20

## 2020-01-04 ENCOUNTER — Ambulatory Visit: Payer: Medicare Other

## 2020-01-04 ENCOUNTER — Other Ambulatory Visit: Payer: Self-pay

## 2020-01-04 DIAGNOSIS — R2681 Unsteadiness on feet: Secondary | ICD-10-CM

## 2020-01-04 DIAGNOSIS — R293 Abnormal posture: Secondary | ICD-10-CM | POA: Diagnosis not present

## 2020-01-04 DIAGNOSIS — R42 Dizziness and giddiness: Secondary | ICD-10-CM

## 2020-01-04 DIAGNOSIS — R2689 Other abnormalities of gait and mobility: Secondary | ICD-10-CM | POA: Diagnosis not present

## 2020-01-04 NOTE — Therapy (Signed)
[facility] [address] [place], [place], [address] Phone: [phone]   Fax:  [phone]  Physical Therapy Treatment  Patient Details  Name: Cody Dickerson MRN: [id number] Date of Birth: 08-Mar-1950 Referring Provider (PT): Dr. Leta Baptist   Encounter Date: 01/04/2020   PT End of Session - 01/04/20 0931    Visit Number 6    Number of Visits 9    Date for PT Re-Evaluation 01/20/20    Authorization Type Medicare/ AARP    Authorization Time Period 12-20-19 - 02-19-20    PT Start Time 0930    PT Stop Time 1014    PT Time Calculation (min) 44 min    Equipment Utilized During Treatment Gait belt    Activity Tolerance Patient tolerated treatment well    Behavior During Therapy [facility] for tasks assessed/performed           Past Medical History:  Diagnosis Date  . Balance problem   . Gout   . Hypercholesterolemia   . Hypertension   . Small bowel obstruction [facility])     Past Surgical History:  Procedure Laterality Date  . BOWEL BLOCKAGE  05/2019    There were no vitals filed for this visit.   Subjective Assessment - 01/04/20 0932    Subjective Patient reports no new changes/complaints since. "feel like I need to review Y stretch". No falls to report. No pain.   reports crepitus in his neck at times and feels a tightness in upper back/ shoulders   Pertinent History Parkinsonism, Lt distal ICA aneurysm, HTN, chronic dizziness and imbalance    Diagnostic tests DaTscan, CT angiogram ; abnormal ocular motility test per Dr. Benjamine Mola    Patient Stated Goals "hopefully to get steady on my feet"    Currently in Pain? No/denies                 [facility] Adult PT Treatment/Exercise - 01/04/20 0935      Transfers   Transfers Sit to Stand;Stand to Sit    Sit to Stand 4: Min guard    Stand to Sit 4: Min guard    Number of Reps 10 reps;Other sets (comment)   3 sets   Comments Completed 1 x 10 reps on airex pad without UE  support. Progressed to completed 2 x 10 reps with BLE placed on red balance beam without UE support. Increased balance challenge noted requiring CGA from PT.       Ambulation/Gait   Ambulation/Gait Yes    Ambulation/Gait Assistance 5: Supervision    Ambulation/Gait Assistance Details completed ambulation outdoors on unlevel surfaces including grass, paved, gravel. Patient superivison throughout, no instances of imbalance noted today. Patient verbalizing he walks on trails without device with no issues noted.     Ambulation Distance (Feet) 200 Feet    Assistive device None    Ambulation Surface Outdoor;Paved;Grass;Gravel    Ramp 5: Supervision    Ramp Details (indicate cue type and reason) completed ascending/descending ramp on blue matx 3 reps to simulate ambulating up/down ramp on unlevel surfaces. increased balance challenge noted but able to maintain balance without PT assistance.     Curb 5: Supervision    Curb Details (indicate cue type and reason) ascend/descend curb outdoors without AD x 4 reps. increased balance challenge stepping up onto curb noted.       High Level Balance   High Level Balance Activities Other (comment)    High Level Balance Comments Completed ambulation  with self ball toss 2 x 50' without addition of dual tasking. Progressed to completing self ball toss 4 x 50' with addition of dual task including counting backwards and naming vegetables/fruits/animals. increased balance challenge with addition of dual task. intermittent CGA required      Neuro Re-ed    Neuro Re-ed Details  Completed standing balance exercises on blue mat: completed alternating toe taps to pebbles x 15 reps forward, then progressed to completing x 15 reps crossover. Standing with wide BOS on blue mat completed horizontal/vertical head turns x 10 reps each with eyes open then progressed to completing x 10 reps each with vision removed. increased challenge noted with EC requiring CGA from PT. Completed  staggered stance, 2 x 30 seconds each with eyes open. increased challenge with RLE posterior.       Exercises   Exercises Other Exercises    Other Exercises  Reviewed completion of Y Stretch per patient's request to ensure proper completion. Patient demonstrating well. PT educating on proper cervical posture with completion.                     PT Short Term Goals - 12/20/19 2235      PT SHORT TERM GOAL #1   Title same as LTG's             PT Long Term Goals - 01/02/20 1950      PT LONG TERM GOAL #1   Title Pt will improve DHI score from 16% to </= 10% to demo improvement in dizziness.    Baseline 16%    Time 4    Period Weeks    Status New      PT LONG TERM GOAL #2   Title Improve SOT composite score by at least 10 points to demo improved balance for reduced fall risk.    Baseline SOT to be assessed    Time 4    Period Weeks    Status New      PT LONG TERM GOAL #3   Title Pt will be independent in HEP for balance/vestibular exercises and postural exercises.    Time 4    Period Weeks    Status New      PT LONG TERM GOAL #4   Title Improve FGA by at least 4 points to demo improved balance with walking.    Time 4    Period Weeks    Status New                 Plan - 01/04/20 1154    Clinical Impression Statement Completed gait training on outdoor surfaces, as well as ascending/descending curb. completed ambulation up/down incline with complaint surface. overall supervision throughout. Completed contiued balance exercises with addition of dual task, increased balance challenge noted with dual task addition requiring CGA from PT. Will continue to progress toward all LTGs.    Personal Factors and Comorbidities Comorbidity 2;Time since onset of injury/illness/exacerbation;Past/Current Experience    Comorbidities h/o small bowel obstruction, HTN, Parkinsonism, Lt distal ICA aneurysm    Examination-Activity Limitations Locomotion  Level;Bend;Squat;Transfers;Reach Overhead    Examination-Participation Restrictions Yard Work;Meal Prep;Shop;Community Activity;Interpersonal Relationship;Other   playing golf   Stability/Clinical Decision Making Evolving/Moderate complexity    Rehab Potential Good    PT Frequency 2x / week    PT Duration 4 weeks   will reassess and continue if pt progressing   PT Treatment/Interventions ADLs/Self Care Home Management;Balance training;Vestibular;Gait training;Therapeutic exercise;Therapeutic activities;Neuromuscular re-education;Patient/family education  PT Next Visit Plan Complete high level balance with dual tasking, obstacle course continue with dynamic gait/balance (focus on vestibular input), SLS activities.    PT Home Exercise Plan tandem, SLS & cervical retraction issued on 12-20-19; Medbridge Code: [id number]    Consulted and Agree with Plan of Care Patient           Patient will benefit from skilled therapeutic intervention in order to improve the following deficits and impairments:  Abnormal gait, Dizziness, Decreased balance, Impaired vision/preception, Postural dysfunction  Visit Diagnosis: Other abnormalities of gait and mobility  Unsteadiness on feet  Abnormal posture  Dizziness and giddiness     Problem List Patient Active Problem List   Diagnosis Date Noted  . SBO (small bowel obstruction) ([place]) 07/06/2019    Jones Bales, PT, DPT 01/04/2020, 11:56 AM  [facility] [address] [place], [place], [address] Phone: [phone]   Fax:  [phone]  Name: Cody Dickerson MRN: [id number] Date of Birth: 04/25/1950

## 2020-01-05 DIAGNOSIS — D649 Anemia, unspecified: Secondary | ICD-10-CM | POA: Diagnosis not present

## 2020-01-05 DIAGNOSIS — I10 Essential (primary) hypertension: Secondary | ICD-10-CM | POA: Diagnosis not present

## 2020-01-05 DIAGNOSIS — Z125 Encounter for screening for malignant neoplasm of prostate: Secondary | ICD-10-CM | POA: Diagnosis not present

## 2020-01-05 DIAGNOSIS — E7800 Pure hypercholesterolemia, unspecified: Secondary | ICD-10-CM | POA: Diagnosis not present

## 2020-01-09 ENCOUNTER — Encounter: Payer: Self-pay | Admitting: Physical Therapy

## 2020-01-09 ENCOUNTER — Other Ambulatory Visit: Payer: Self-pay

## 2020-01-09 ENCOUNTER — Ambulatory Visit: Payer: Medicare Other | Admitting: Physical Therapy

## 2020-01-09 DIAGNOSIS — R2689 Other abnormalities of gait and mobility: Secondary | ICD-10-CM | POA: Diagnosis not present

## 2020-01-09 DIAGNOSIS — R2681 Unsteadiness on feet: Secondary | ICD-10-CM

## 2020-01-09 DIAGNOSIS — R42 Dizziness and giddiness: Secondary | ICD-10-CM | POA: Diagnosis not present

## 2020-01-09 DIAGNOSIS — R293 Abnormal posture: Secondary | ICD-10-CM

## 2020-01-09 NOTE — Therapy (Signed)
[facility] [address] [place], [place], [address] Phone: [phone]   Fax:  [phone]  Physical Therapy Treatment  Patient Details  Name: Cody Dickerson MRN: [id number] Date of Birth: 1950/10/15 Referring Provider (PT): Dr. Leta Baptist   Encounter Date: 01/09/2020   PT End of Session - 01/09/20 1946    Visit Number 7    Number of Visits 9    Date for PT Re-Evaluation 01/20/20    Authorization Type Medicare/ AARP    Authorization Time Period 12-20-19 - 02-19-20    PT Start Time 0931    PT Stop Time 1015    PT Time Calculation (min) 44 min    Equipment Utilized During Treatment Gait belt    Activity Tolerance Patient tolerated treatment well    Behavior During Therapy [facility] for tasks assessed/performed           Past Medical History:  Diagnosis Date  . Balance problem   . Gout   . Hypercholesterolemia   . Hypertension   . Small bowel obstruction [facility])     Past Surgical History:  Procedure Laterality Date  . BOWEL BLOCKAGE  05/2019    There were no vitals filed for this visit.   Subjective Assessment - 01/09/20 0940    Subjective Pt reports no changes - states he continues to have balance problems (reports difficulty with tandem and SLS) -  sees Dr. Harrington Challenger on Wed. this week;  pt was informed that the high level balance skillls (tandem and SLS) would require much practice over time to improve   reports crepitus in his neck at times and feels a tightness in upper back/ shoulders   Pertinent History Parkinsonism, Lt distal ICA aneurysm, HTN, chronic dizziness and imbalance    Diagnostic tests DaTscan, CT angiogram ; abnormal ocular motility test per Dr. Benjamine Mola    Patient Stated Goals "hopefully to get steady on my feet"    Currently in Pain? No/denies                             [facility] Adult PT Treatment/Exercise - 01/09/20 0943      Ambulation/Gait   Ambulation/Gait Yes    Ambulation/Gait  Assistance 5: Supervision    Ambulation/Gait Assistance Details noodle placed behind pt's back to facilitate upright posture with more trunk extension     Ambulation Distance (Feet) 250 Feet    Gait Pattern Within Functional Limits    Ambulation Surface Level;Indoor               Balance Exercises - 01/09/20 0001      Balance Exercises: Standing   Standing Eyes Closed Wide (BOA);Head turns;Foam/compliant surface;5 reps   horizontal and vertical head turns   Step Over Hurdles / Cones stepped over 2 orange hurdles of different heights - standing on red mat; pt had no difficulty and no LOB with this activity     Other Standing Exercises Pt amb. tossing ball straight up approx. 40' x 1 rep forward and approx. 20' backward    Other Standing Exercises Comments Pt performed marching on incline and then on decline; EO with no head turns;  EO with horizontal and vertical head turns:  marching with EC - no head turns with min to mod assist with LOB          Pt performed cone taps to 3 cones - standing on floor with CGA;  Progressed  to tipping cone over and then standing upright  Marching on blue mat - EO -no head turns; with head turns; EC with no head turns with min assist for recovery of balance; EC with head turns With mod assist for recovery of LOB  Pt amb. Across blue and red mats placed together (no head turns) - from floor for variation in surfaces ; pt had no difficulty amb.  On either mat surface  Pt performed crossovers front - alternating feet - standing on blue mat - 5 reps each with min assist for recovery of LOB; then stepping Behind 5 reps each alternating with min assist for balance  Pt performed kicking 3 bean bags approx. 15' x 2 reps to facilitate improved SLS on each leg - CGA with this activity   PT Short Term Goals - 12/20/19 2235      PT SHORT TERM GOAL #1   Title same as LTG's             PT Long Term Goals - 01/09/20 1957      PT LONG TERM GOAL #1    Title Pt will improve DHI score from 16% to </= 10% to demo improvement in dizziness.    Baseline 16%    Time 4    Period Weeks    Status New      PT LONG TERM GOAL #2   Title Improve SOT composite score by at least 10 points to demo improved balance for reduced fall risk.    Baseline SOT to be assessed    Time 4    Period Weeks    Status New      PT LONG TERM GOAL #3   Title Pt will be independent in HEP for balance/vestibular exercises and postural exercises.    Time 4    Period Weeks    Status New      PT LONG TERM GOAL #4   Title Improve FGA by at least 4 points to demo improved balance with walking.    Time 4    Period Weeks    Status New                 Plan - 01/09/20 1947    Clinical Impression Statement Skilled PT session focused on balance training on compliant surface for increased vestibular input and also gait training on simulated uneven surfaces with stepping stones of various heights and sizes placed under blue mat for greater variability and unevenness of surface.  Pt had no significant LOB with negotiation of obstacles and stepping over/on obstacles placed under blue mat for increased compliance and variability.  Pt had greatest LOB with activities with EC - marching on incline and with standing with feet together on blue mat with head turns with EC (indicative of decreased vestibular input).  Pt's gait is improving with increased upright posture noted.  Cont with POC.    Personal Factors and Comorbidities Comorbidity 2;Time since onset of injury/illness/exacerbation;Past/Current Experience    Comorbidities h/o small bowel obstruction, HTN, Parkinsonism, Lt distal ICA aneurysm    Examination-Activity Limitations Locomotion Level;Bend;Squat;Transfers;Reach Overhead    Examination-Participation Restrictions Yard Work;Meal Prep;Shop;Community Activity;Interpersonal Relationship;Other   playing golf   Stability/Clinical Decision Making Evolving/Moderate  complexity    Rehab Potential Good    PT Frequency 2x / week    PT Duration 4 weeks   will reassess and continue if pt progressing   PT Treatment/Interventions ADLs/Self Care Home Management;Balance training;Vestibular;Gait training;Therapeutic exercise;Therapeutic activities;Neuromuscular re-education;Patient/family education  PT Next Visit Plan continue with dynamic gait/balance (focus on vestibular input), SLS activities.    PT Home Exercise Plan tandem, SLS & cervical retraction issued on 12-20-19; Medbridge Code: [id number]    Consulted and Agree with Plan of Care Patient           Patient will benefit from skilled therapeutic intervention in order to improve the following deficits and impairments:  Abnormal gait, Dizziness, Decreased balance, Impaired vision/preception, Postural dysfunction  Visit Diagnosis: Other abnormalities of gait and mobility  Unsteadiness on feet  Abnormal posture     Problem List Patient Active Problem List   Diagnosis Date Noted  . SBO (small bowel obstruction) ([place]) 07/06/2019    Marlon Vonruden, [place], PT 01/09/2020, 7:59 PM  [facility] [address] [place], [place], [address] Phone: [phone]   Fax:  [phone]  Name: Cody Dickerson MRN: [id number] Date of Birth: 1950-07-06

## 2020-01-11 ENCOUNTER — Ambulatory Visit: Payer: Medicare Other

## 2020-01-11 DIAGNOSIS — I10 Essential (primary) hypertension: Secondary | ICD-10-CM | POA: Diagnosis not present

## 2020-01-11 DIAGNOSIS — E7800 Pure hypercholesterolemia, unspecified: Secondary | ICD-10-CM | POA: Diagnosis not present

## 2020-01-11 DIAGNOSIS — Z125 Encounter for screening for malignant neoplasm of prostate: Secondary | ICD-10-CM | POA: Diagnosis not present

## 2020-01-11 DIAGNOSIS — M1A9XX1 Chronic gout, unspecified, with tophus (tophi): Secondary | ICD-10-CM | POA: Diagnosis not present

## 2020-01-11 DIAGNOSIS — Z1159 Encounter for screening for other viral diseases: Secondary | ICD-10-CM | POA: Diagnosis not present

## 2020-01-11 DIAGNOSIS — Z0000 Encounter for general adult medical examination without abnormal findings: Secondary | ICD-10-CM | POA: Diagnosis not present

## 2020-01-11 DIAGNOSIS — R2689 Other abnormalities of gait and mobility: Secondary | ICD-10-CM | POA: Diagnosis not present

## 2020-01-11 DIAGNOSIS — R972 Elevated prostate specific antigen [PSA]: Secondary | ICD-10-CM | POA: Diagnosis not present

## 2020-01-11 DIAGNOSIS — D649 Anemia, unspecified: Secondary | ICD-10-CM | POA: Diagnosis not present

## 2020-01-13 ENCOUNTER — Other Ambulatory Visit: Payer: Self-pay

## 2020-01-13 ENCOUNTER — Ambulatory Visit: Payer: Medicare Other

## 2020-01-13 DIAGNOSIS — R42 Dizziness and giddiness: Secondary | ICD-10-CM | POA: Diagnosis not present

## 2020-01-13 DIAGNOSIS — R2681 Unsteadiness on feet: Secondary | ICD-10-CM

## 2020-01-13 DIAGNOSIS — R2689 Other abnormalities of gait and mobility: Secondary | ICD-10-CM | POA: Diagnosis not present

## 2020-01-13 DIAGNOSIS — R293 Abnormal posture: Secondary | ICD-10-CM

## 2020-01-13 NOTE — Therapy (Signed)
[facility] [address] [place], [place], [address] Phone: [phone]   Fax:  [phone]  Physical Therapy Treatment  Patient Details  Name: Cody Dickerson MRN: [id number] Date of Birth: 10-24-1950 Referring Provider (PT): Dr. Leta Baptist   Encounter Date: 01/13/2020   PT End of Session - 01/13/20 0935    Visit Number 8    Number of Visits 9    Date for PT Re-Evaluation 01/20/20    Authorization Type Medicare/ AARP    Authorization Time Period 12-20-19 - 02-19-20    PT Start Time 0932    PT Stop Time 1015    PT Time Calculation (min) 43 min    Equipment Utilized During Treatment Gait belt    Activity Tolerance Patient tolerated treatment well    Behavior During Therapy [facility] for tasks assessed/performed           Past Medical History:  Diagnosis Date  . Balance problem   . Gout   . Hypercholesterolemia   . Hypertension   . Small bowel obstruction [facility])     Past Surgical History:  Procedure Laterality Date  . BOWEL BLOCKAGE  05/2019    There were no vitals filed for this visit.   Subjective Assessment - 01/13/20 0936    Subjective Patient reports no new changes since last visit. Reports still feels wobbly at times. Reports appt with Dr. Harrington Challenger went well.   reports crepitus in his neck at times and feels a tightness in upper back/ shoulders   Pertinent History Parkinsonism, Lt distal ICA aneurysm, HTN, chronic dizziness and imbalance    Diagnostic tests DaTscan, CT angiogram ; abnormal ocular motility test per Dr. Benjamine Mola    Patient Stated Goals "hopefully to get steady on my feet"    Currently in Pain? No/denies              [facility] PT Assessment - 01/13/20 0942      Observation/Other Assessments   Other Surveys  Dizziness Handicap Inventory (DHI)    Dizziness Handicap Inventory (DHI)  Completed DH (on paper): 10%       Functional Gait  Assessment   Gait assessed  Yes    Gait Level Surface Walks 20  ft in less than 7 sec but greater than 5.5 sec, uses assistive device, slower speed, mild gait deviations, or deviates 6-10 in outside of the 12 in walkway width.    Change in Gait Speed Able to smoothly change walking speed without loss of balance or gait deviation. Deviate no more than 6 in outside of the 12 in walkway width.    Gait with Horizontal Head Turns Performs head turns smoothly with slight change in gait velocity (eg, minor disruption to smooth gait path), deviates 6-10 in outside 12 in walkway width, or uses an assistive device.    Gait with Vertical Head Turns Performs head turns with no change in gait. Deviates no more than 6 in outside 12 in walkway width.    Gait and Pivot Turn Pivot turns safely within 3 sec and stops quickly with no loss of balance.    Step Over Obstacle Is able to step over 2 stacked shoe boxes taped together (9 in total height) without changing gait speed. No evidence of imbalance.    Gait with Narrow Base of Support Ambulates 4-7 steps.    Gait with Eyes Closed Walks 20 ft, uses assistive device, slower speed, mild gait deviations, deviates 6-10 in outside 12 in  walkway width. Ambulates 20 ft in less than 9 sec but greater than 7 sec.    Ambulating Backwards Walks 20 ft, uses assistive device, slower speed, mild gait deviations, deviates 6-10 in outside 12 in walkway width.    Steps Alternating feet, no rail.    Total Score 24    FGA comment: 24/30 = Medium Fall Risk             [facility] Adult PT Treatment/Exercise - 01/13/20 0001      Ambulation/Gait   Ambulation/Gait Yes    Ambulation/Gait Assistance 6: Modified independent (Device/Increase time)    Ambulation/Gait Assistance Details throughout therapy gym with activities    Ambulation Distance (Feet) --   clinic distances   Assistive device None    Gait Pattern Within Functional Limits    Ambulation Surface Level;Indoor    Stairs Yes    Stairs Assistance 6: Modified independent (Device/Increase  time)    Stair Management Technique No rails;Alternating pattern;Forwards    Number of Stairs 4    Height of Stairs 6               Balance Exercises - 01/13/20 0001      Balance Exercises: Standing   Standing Eyes Opened Wide (BOA);Head turns;Foam/compliant surface;Limitations    Standing Eyes Opened Limitations completed on thick dense blue pad, intermittent CGA completed horizontal head turns 2 x 10 reps, followed by vertical head turns 1 x 10. patietn require single fingertip support on chair for improved balance, unable to complete without UE support on dense foam.     Tandem Stance Eyes open;Limitations    Tandem Stance Time completed tandem stance standing upward on incline with horizontal/vertical head turns x 10 reps each. increased challenge with horizontal > vertical.    Other Standing Exercises Completed ambulation with kicking beans bag 2 x 40' to faciliate SLS, intermittent CGA with completion  required.              PT Education - 01/13/20 0954    Education Details educated on progress toward LTGs    Methods Explanation    Comprehension Verbalized understanding            PT Short Term Goals - 12/20/19 2235      PT SHORT TERM GOAL #1   Title same as LTG's             PT Long Term Goals - 01/13/20 0940      PT LONG TERM GOAL #1   Title Pt will improve DHI score from 16% to </= 10% to demo improvement in dizziness.    Baseline 16%, 10% on 11/12    Time 4    Period Weeks    Status Achieved      PT LONG TERM GOAL #2   Title Improve SOT composite score by at least 10 points to demo improved balance for reduced fall risk.    Baseline SOT to be assessed    Time 4    Period Weeks    Status New      PT LONG TERM GOAL #3   Title Pt will be independent in HEP for balance/vestibular exercises and postural exercises.    Baseline reports indepedence, completing 3-4x/week    Time 4    Period Weeks    Status On-going      PT LONG TERM GOAL #4    Title Improve FGA by at least 4 points to demo improved balance with walking.  Baseline 17/30; 24/30    Time 4    Period Weeks    Status Achieved                 Plan - 01/13/20 0954    Clinical Impression Statement Today's skilled PT session included assessment of patient's progress toward LTG's. Patient able to meet LTG #1 with improvements on DHI, patient scoring 10% today. patient also able to meet LTG #4 today, demonstrating improved balance with completion of FGA, patient scoring 24/30 demonstrating reduced fall risk. Continued balance exercises progressing to thicker foam to further challenge balance, and incorporating SLS activity. Will finish assessment of LTGs at next visit.    Personal Factors and Comorbidities Comorbidity 2;Time since onset of injury/illness/exacerbation;Past/Current Experience    Comorbidities h/o small bowel obstruction, HTN, Parkinsonism, Lt distal ICA aneurysm    Examination-Activity Limitations Locomotion Level;Bend;Squat;Transfers;Reach Overhead    Examination-Participation Restrictions Yard Work;Meal Prep;Shop;Community Activity;Interpersonal Relationship;Other   playing golf   Stability/Clinical Decision Making Evolving/Moderate complexity    Rehab Potential Good    PT Frequency 2x / week    PT Duration 4 weeks   will reassess and continue if pt progressing   PT Treatment/Interventions ADLs/Self Care Home Management;Balance training;Vestibular;Gait training;Therapeutic exercise;Therapeutic activities;Neuromuscular re-education;Patient/family education    PT Next Visit Plan Complete SOT and Finish checking LTG; Determine if Re-Cert or Discharge.    PT Home Exercise Plan tandem, SLS & cervical retraction issued on 12-20-19; Medbridge Code: [id number]    Consulted and Agree with Plan of Care Patient           Patient will benefit from skilled therapeutic intervention in order to improve the following deficits and impairments:  Abnormal gait,  Dizziness, Decreased balance, Impaired vision/preception, Postural dysfunction  Visit Diagnosis: Other abnormalities of gait and mobility  Unsteadiness on feet  Abnormal posture     Problem List Patient Active Problem List   Diagnosis Date Noted  . SBO (small bowel obstruction) ([place]) 07/06/2019    Jones Bales, PT, DPT 01/13/2020, 10:58 AM  [place] [address] [place], [place], [address] Phone: [phone]   Fax:  [phone]  Name: ASAH LAMAY MRN: [id number] Date of Birth: September 01, 1950

## 2020-01-16 ENCOUNTER — Other Ambulatory Visit: Payer: Self-pay

## 2020-01-16 ENCOUNTER — Ambulatory Visit: Payer: Medicare Other | Admitting: Physical Therapy

## 2020-01-16 DIAGNOSIS — R2681 Unsteadiness on feet: Secondary | ICD-10-CM | POA: Diagnosis not present

## 2020-01-16 DIAGNOSIS — R2689 Other abnormalities of gait and mobility: Secondary | ICD-10-CM

## 2020-01-16 DIAGNOSIS — R293 Abnormal posture: Secondary | ICD-10-CM | POA: Diagnosis not present

## 2020-01-16 DIAGNOSIS — R42 Dizziness and giddiness: Secondary | ICD-10-CM | POA: Diagnosis not present

## 2020-01-17 ENCOUNTER — Encounter: Payer: Self-pay | Admitting: Physical Therapy

## 2020-01-17 NOTE — Therapy (Addendum)
[facility] [address] [place], [place], [address] Phone: [phone]   Fax:  [phone]  Physical Therapy Treatment & 9th visit progress note  Reporting period:  12-19-19 - 01-16-20 See below for progress towards goals  Patient Details  Name: Cody Dickerson MRN: [id number] Date of Birth: 25-Jun-1950 Referring Provider (PT): Dr. Leta Baptist   Encounter Date: 01/16/2020   PT End of Session - 01/17/20 1929    Visit Number 9   progress note completed this session   Number of Visits 14    Date for PT Re-Evaluation 02/17/20    Authorization Type Medicare/ AARP    Authorization Time Period 12-20-19 - 02-19-20; 01-16-20 - 03-02-20    PT Start Time 0933    PT Stop Time 1015    PT Time Calculation (min) 42 min    Equipment Utilized During Treatment Other (comment)   harness vest used with SOT on balance master   Activity Tolerance Patient tolerated treatment well    Behavior During Therapy [facility] for tasks assessed/performed           Past Medical History:  Diagnosis Date  . Balance problem   . Gout   . Hypercholesterolemia   . Hypertension   . Small bowel obstruction [facility])     Past Surgical History:  Procedure Laterality Date  . BOWEL BLOCKAGE  05/2019    There were no vitals filed for this visit.         Sensory Organization test;  Composite score 71/100;  N= 68/100  Somatosensory WNL's Visual WNL's Vestibular decreased by approx. 30%; pt's score approx. 40/100 with N= 54/100  Condition 1 = all 3 trials WNL's Condition 2 = all 3 trials WNL's Condition 3 = all 3 trials WNL's Condition 4 = trials 1 & 3 WNL's:  Trial 2 slightly decreased Condition 5 = trial 1 decreased slightly from N:  Trial 2 WNL's:  FALL on trial 3 Condition 6 = all 3 trials WNL's                  Balance Exercises - 01/17/20 0001      Balance Exercises: Standing   Standing Eyes Opened Narrow base of support  (BOS);Wide (BOA);Head turns;Foam/compliant surface;5 reps    Standing Eyes Closed Wide (BOA);Head turns;Foam/compliant surface;5 reps   horizontal and vertical head turns   Other Standing Exercises tap ups to 1st step 5 reps each ; tap ups to 2nd step 5 reps each with minimal UE support     Other Standing Exercises Comments Pt performed marching on incline and then on decline; EO with no head turns;  EO with horizontal and vertical head turns:  marching with EC - no head turns with min to mod assist with LOB             PT Education - 01/17/20 1928    Education Details reviewed LTG's and progress - plan is to continue PT 1x/week for 4 weeks    Person(s) Educated Patient    Methods Explanation    Comprehension Verbalized understanding            PT Short Term Goals - 12/20/19 2235      PT SHORT TERM GOAL #1   Title same as LTG's             PT Long Term Goals - 01/16/20 0942      PT LONG TERM GOAL #1   Title Pt will  improve DHI score from 16% to </= 10% to demo improvement in dizziness.  UPDATED GOAL; INCREASE DHI SCORE TO </= 6%    Baseline 16%, 10% on 11/12    Time 4    Period Weeks    Status Revised    Target Date 02/17/20      PT LONG TERM GOAL #2   Title Improve SOT composite score by at least 10 points to demo improved balance for reduced fall risk.; UPDATED LTG;  INCREASE VESTIBULAR INPUT TO WNL'S ON SOT    Baseline --    Time 4    Period Weeks    Status Revised    Target Date 02/17/20      PT LONG TERM GOAL #3   Title Pt will be independent in HEP for balance/vestibular exercises and postural exercises.    Baseline reports indepedence, completing 3-4x/week    Time 4    Period Weeks    Status On-going    Target Date 02/17/20      PT LONG TERM GOAL #4   Title Improve FGA by at least 4 points to demo improved balance with walking.  UPDATED LTG:  INCREASE SCORE TO >/= 27/30 TO DEMO IMPROVED BALANCE WITH GAIT.    Baseline 17/30; 24/30    Time 4    Period  Weeks    Status Revised    Target Date 02/17/20                 Plan - 01/17/20 1942    Clinical Impression Statement This 9th visit progress note covers dates 12-19-19 - 01-16-20.  Pt has met LTG's #1 and 4 with DHI score improving from 16% to 10% and FGA score increasing from 17/30 to 24/30.  Pt's vestibular score on SOT did decrease from initial score (reason unknown) - composite score relatively unchanged.  Pt will continue to benefit from PT to address balance, gait and vestibular deficits.  Renewal to be completed for additional 1x/week for 4 weeks.    Personal Factors and Comorbidities Comorbidity 2;Time since onset of injury/illness/exacerbation;Past/Current Experience    Comorbidities h/o small bowel obstruction, HTN, Parkinsonism, Lt distal ICA aneurysm    Examination-Activity Limitations Locomotion Level;Bend;Squat;Transfers;Reach Overhead    Examination-Participation Restrictions Yard Work;Meal Prep;Shop;Community Activity;Interpersonal Relationship;Other   playing golf   Stability/Clinical Decision Making Evolving/Moderate complexity    Rehab Potential Good    PT Frequency 2x / week    PT Duration 4 weeks   will reassess and continue if pt progressing   PT Treatment/Interventions ADLs/Self Care Home Management;Balance training;Vestibular;Gait training;Therapeutic exercise;Therapeutic activities;Neuromuscular re-education;Patient/family education    PT Next Visit Plan Progress note completed early so 10th visit note not needed; recert done also.  Cont with balance/vestibular exercises and high level gait activities    PT Home Exercise Plan tandem, SLS & cervical retraction issued on 12-20-19; Medbridge Code: [id number]    Consulted and Agree with Plan of Care Patient           Patient will benefit from skilled therapeutic intervention in order to improve the following deficits and impairments:  Abnormal gait, Dizziness, Decreased balance, Impaired vision/preception, Postural  dysfunction  Visit Diagnosis: Other abnormalities of gait and mobility - Plan: PT plan of care cert/re-cert  Unsteadiness on feet - Plan: PT plan of care cert/re-cert     Problem List Patient Active Problem List   Diagnosis Date Noted  . SBO (small bowel obstruction) ([place]) 07/06/2019    Halim Surrette, [place], PT 01/17/2020, 8:00  PM  [facility] [address] [place], [place], [address] Phone: [phone]   Fax:  [phone]  Name: Cody Dickerson MRN: [id number] Date of Birth: 08/02/1950

## 2020-01-18 ENCOUNTER — Other Ambulatory Visit: Payer: Self-pay

## 2020-01-18 ENCOUNTER — Ambulatory Visit: Payer: Medicare Other

## 2020-01-18 DIAGNOSIS — R293 Abnormal posture: Secondary | ICD-10-CM | POA: Diagnosis not present

## 2020-01-18 DIAGNOSIS — R2681 Unsteadiness on feet: Secondary | ICD-10-CM | POA: Diagnosis not present

## 2020-01-18 DIAGNOSIS — R2689 Other abnormalities of gait and mobility: Secondary | ICD-10-CM | POA: Diagnosis not present

## 2020-01-18 DIAGNOSIS — R42 Dizziness and giddiness: Secondary | ICD-10-CM

## 2020-01-18 NOTE — Therapy (Signed)
[facility] [address] [place], [place], [address] Phone: [phone]   Fax:  [phone]  Physical Therapy Treatment  Patient Details  Name: Cody Dickerson MRN: [id number] Date of Birth: 1950/03/23 Referring Provider (PT): Dr. Leta Baptist   Encounter Date: 01/18/2020   PT End of Session - 01/18/20 0932    Visit Number 10    Number of Visits 14    Date for PT Re-Evaluation 02/17/20    Authorization Type Medicare/ AARP    Authorization Time Period 12-20-19 - 02-19-20; 01-16-20 - 03-02-20    PT Start Time 0931    PT Stop Time 1015    PT Time Calculation (min) 44 min    Equipment Utilized During Treatment Other (comment)   harness vest used with SOT on balance master   Activity Tolerance Patient tolerated treatment well    Behavior During Therapy [facility] for tasks assessed/performed           Past Medical History:  Diagnosis Date  . Balance problem   . Gout   . Hypercholesterolemia   . Hypertension   . Small bowel obstruction [facility])     Past Surgical History:  Procedure Laterality Date  . BOWEL BLOCKAGE  05/2019    There were no vitals filed for this visit.   Subjective Assessment - 01/18/20 0933    Subjective Paitent reports no new changes/complaints since last visit. No falls or pain to report. Reports continues to feel unstable.    Pertinent History Parkinsonism, Lt distal ICA aneurysm, HTN, chronic dizziness and imbalance    Diagnostic tests DaTscan, CT angiogram ; abnormal ocular motility test per Dr. Benjamine Mola    Patient Stated Goals "hopefully to get steady on my feet"    Currently in Pain? No/denies                   [facility] Adult PT Treatment/Exercise - 01/18/20 0001      Ambulation/Gait   Ambulation/Gait Yes    Ambulation/Gait Assistance 6: Modified independent (Device/Increase time)    Ambulation/Gait Assistance Details with high level balance, and into/out of therapy session    Assistive  device None    Gait Pattern Within Functional Limits    Ambulation Surface Level;Indoor      High Level Balance   High Level Balance Activities Backward walking;Tandem walking;Sudden stops;Turns    High Level Balance Comments Completed ambulation with high level balance acitivies with PT providing sudden instructions for backwards walking/stops/turns etc. Completed x 230 ft. Increased challenge with sudden stops and addition of tandem walking. CGA required with completion. Also completed forward walking with self ball toss 2 x 30', followed by backwards walking with self ball toss 2 x 30'                Balance Exercises - 01/18/20 0945      Balance Exercises: Standing   Standing Eyes Opened Wide (Dresser);Head turns;Foam/compliant surface;Limitations    Standing Eyes Opened Limitations standing dense thick blue foam, completed horizontal/vertical head turns 2 x 10 reps each without UE support    Standing Eyes Closed Wide (BOA);Foam/compliant surface;4 reps;30 secs;Limitations    Standing Eyes Closed Limitations completed on dense thick blue foam EC with wide BOS 4 x 30 seconds.    Tandem Stance Eyes open;Foam/compliant surface;Limitations    Tandem Stance Time compelted tandem stance on blue mat with addition of horizontal/vertical head turns 1 x 10 reps with each position.     SLS  with Vectors Foam/compliant surface;Intermittent upper extremity assist;Limitations    SLS with Vectors Limitations standing on airex completed alternating toe taps to cones x 10 reps without UE support. intermittent CGA required    Tandem Gait Forward;Retro;Foam/compliant surface;Intermittent upper extremity support;Limitations    Tandem Gait Limitations completed tandem gait forward/retro x 3 laps on blue mat wth intermittent UE support. Without blue mat completed tandem gait x 4 laps, down and back with addition of horizontal head turns.     Step Over Hurdles / Cones standing on airex, completed alternating  step over orange hurdle to firm surface and backwards back onto foam, completed x 5 reps with BUE support, x 5 reps with single UE support, and finally progressed to completed x 10 reps without UE support. CGA required    Other Standing Exercises Comments Completed marching on incline with EO x 15 rep on BLE, progressed to completing marching with horizontal head turns x 10 reps without UE support. Standing on incline with narrow BOS completed horizontal head turns with EO x 10 reps, vertical head turns x 10 reps.                PT Short Term Goals - 12/20/19 2235      PT SHORT TERM GOAL #1   Title same as LTG's             PT Long Term Goals - 01/16/20 0942      PT LONG TERM GOAL #1   Title Pt will improve DHI score from 16% to </= 10% to demo improvement in dizziness.  UPDATED GOAL; INCREASE DHI SCORE TO </= 6%    Baseline 16%, 10% on 11/12    Time 4    Period Weeks    Status Revised    Target Date 02/17/20      PT LONG TERM GOAL #2   Title Improve SOT composite score by at least 10 points to demo improved balance for reduced fall risk.; UPDATED LTG;  INCREASE VESTIBULAR INPUT TO WNL'S ON SOT    Baseline --    Time 4    Period Weeks    Status Revised    Target Date 02/17/20      PT LONG TERM GOAL #3   Title Pt will be independent in HEP for balance/vestibular exercises and postural exercises.    Baseline reports indepedence, completing 3-4x/week    Time 4    Period Weeks    Status On-going    Target Date 02/17/20      PT LONG TERM GOAL #4   Title Improve FGA by at least 4 points to demo improved balance with walking.  UPDATED LTG:  INCREASE SCORE TO >/= 27/30 TO DEMO IMPROVED BALANCE WITH GAIT.    Baseline 17/30; 24/30    Time 4    Period Weeks    Status Revised    Target Date 02/17/20                 Plan - 01/18/20 1025    Clinical Impression Statement Continued progression of balance activites progressing as tolerated by patient. Patient continue  to demo most dificulty with horizontal head turns, and activites promoting narrow BOS such as tandem walking. intermittent CGA required throghout with high level balance. Will continue per POC.    Personal Factors and Comorbidities Comorbidity 2;Time since onset of injury/illness/exacerbation;Past/Current Experience    Comorbidities h/o small bowel obstruction, HTN, Parkinsonism, Lt distal ICA aneurysm    Examination-Activity Limitations Locomotion  Level;Bend;Squat;Transfers;Reach Overhead    Examination-Participation Restrictions Yard Work;Meal Prep;Shop;Community Activity;Interpersonal Relationship;Other   playing golf   Stability/Clinical Decision Making Evolving/Moderate complexity    Rehab Potential Good    PT Frequency 2x / week    PT Duration 4 weeks   will reassess and continue if pt progressing   PT Treatment/Interventions ADLs/Self Care Home Management;Balance training;Vestibular;Gait training;Therapeutic exercise;Therapeutic activities;Neuromuscular re-education;Patient/family education    PT Next Visit Plan Cont with balance/vestibular exercises and high level gait activities    PT Home Exercise Plan tandem, SLS & cervical retraction issued on 12-20-19; Medbridge Code: [id number]    Consulted and Agree with Plan of Care Patient           Patient will benefit from skilled therapeutic intervention in order to improve the following deficits and impairments:  Abnormal gait, Dizziness, Decreased balance, Impaired vision/preception, Postural dysfunction  Visit Diagnosis: Other abnormalities of gait and mobility  Unsteadiness on feet  Abnormal posture  Dizziness and giddiness     Problem List Patient Active Problem List   Diagnosis Date Noted  . SBO (small bowel obstruction) ([place]) 07/06/2019    Jones Bales, PT, DPT 01/18/2020, 10:26 AM  [facility] [address] [place], [place], [address] Phone:  [phone]   Fax:  [phone]  Name: Cody Dickerson MRN: [id number] Date of Birth: 1950/08/27

## 2020-01-30 ENCOUNTER — Other Ambulatory Visit: Payer: Self-pay

## 2020-01-30 ENCOUNTER — Ambulatory Visit: Payer: Medicare Other

## 2020-01-30 DIAGNOSIS — R2689 Other abnormalities of gait and mobility: Secondary | ICD-10-CM

## 2020-01-30 DIAGNOSIS — R2681 Unsteadiness on feet: Secondary | ICD-10-CM

## 2020-01-30 DIAGNOSIS — R293 Abnormal posture: Secondary | ICD-10-CM | POA: Diagnosis not present

## 2020-01-30 DIAGNOSIS — R42 Dizziness and giddiness: Secondary | ICD-10-CM

## 2020-01-30 NOTE — Therapy (Signed)
[facility] [address] [place], [place], [address] Phone: [phone]   Fax:  [phone]  Physical Therapy Treatment  Patient Details  Name: Cody Dickerson MRN: [id number] Date of Birth: [birth year] Referring Provider (PT): Dr. Leta Baptist   Encounter Date: 01/30/2020   PT End of Session - 01/30/20 0932    Visit Number 11    Number of Visits 14    Date for PT Re-Evaluation 02/17/20    Authorization Type Medicare/ AARP    Authorization Time Period 12-20-19 - 02-19-20; 01-16-20 - 03-02-20    PT Start Time 0931    PT Stop Time 1014    PT Time Calculation (min) 43 min    Equipment Utilized During Treatment Other (comment)   harness vest used with SOT on balance master   Activity Tolerance Patient tolerated treatment well    Behavior During Therapy [facility] for tasks assessed/performed           Past Medical History:  Diagnosis Date  . Balance problem   . Gout   . Hypercholesterolemia   . Hypertension   . Small bowel obstruction [facility])     Past Surgical History:  Procedure Laterality Date  . BOWEL BLOCKAGE  05/2019    There were no vitals filed for this visit.   Subjective Assessment - 01/30/20 0933    Subjective No new changes. Reports that he feels tandem stance and single leg stance is getting slightly better. No falls to report. No pain.    Pertinent History Parkinsonism, Lt distal ICA aneurysm, HTN, chronic dizziness and imbalance    Diagnostic tests DaTscan, CT angiogram ; abnormal ocular motility test per Dr. Benjamine Mola    Patient Stated Goals "hopefully to get steady on my feet"    Currently in Pain? No/denies                   Balance Exercises - 01/30/20 0946      Balance Exercises: Standing   Standing Eyes Opened Wide (BOA);Head turns;Foam/compliant surface;Limitations    Standing Eyes Opened Limitations standing on dense thick blue foam completed static standing with EO x 1 minute working on  controlled stance. Progressed to completing alternating shoulder flexion x 10 reps with BUE, increased balance challenge. Completed horizontal/vertical head turns x 10 reps each direction. Increased challenge with horizontal > vertical. Overall patient demo increase dbalance challenge on dense thick blue foam requiring CGA from PT and intermittent UE support    Standing Eyes Closed Narrow base of support (BOS);Foam/compliant surface;3 reps;30 secs;Limitations    Standing Eyes Closed Limitations standing on airex, 3 x 30 seconds    Tandem Stance Eyes open;Foam/compliant surface;Limitations    Tandem Stance Time completed 3/4th tandem stance on foam, 2 x 30 seconds each.     SLS Eyes open;Solid surface;Intermittent upper extremity support;Time;2 reps    SLS Time 10 secs x 4 reps on BLE. increased stance on RLE > LLE. intermittent UE support required    SLS with Vectors Solid surface;Intermittent upper extremity assist;Limitations    SLS with Vectors Limitations standing on rockerboard A/P completed alternating toe taps x 10 reps, progressing ot crossover x 10 reps. intermittent UE support required    Tandem Gait Forward;Intermittent upper extremity support;4 reps;Limitations    Tandem Gait Limitations completed x 1 lap of tandem gait without UE support, progressed to addition of horizontal/vertical head turns x 3 laps down and back. UE support required intermittently.     Other Standing  Exercises At countertop: completed step up with runners march on BOSU (round side up) completed x 15 reps on BLE alteranting. Single UE support required. Standing with single UE support, and placing single lower extremity on soccer ball completed ant/post roll 2 x 10 reps on BLE. Increased verbal cues and demonstration required for proper completion.              PT Education - 01/30/20 1015    Education Details completion of 3/4th tandem stance on complaint surface (verbal education, no handout provided)     Person(s) Educated Patient    Methods Explanation;Demonstration    Comprehension Verbalized understanding;Returned demonstration            PT Short Term Goals - 12/20/19 2235      PT SHORT TERM GOAL #1   Title same as LTG's             PT Long Term Goals - 01/16/20 0942      PT LONG TERM GOAL #1   Title Pt will improve DHI score from 16% to </= 10% to demo improvement in dizziness.  UPDATED GOAL; INCREASE DHI SCORE TO </= 6%    Baseline 16%, 10% on 11/12    Time 4    Period Weeks    Status Revised    Target Date 02/17/20      PT LONG TERM GOAL #2   Title Improve SOT composite score by at least 10 points to demo improved balance for reduced fall risk.; UPDATED LTG;  INCREASE VESTIBULAR INPUT TO WNL'S ON SOT    Baseline --    Time 4    Period Weeks    Status Revised    Target Date 02/17/20      PT LONG TERM GOAL #3   Title Pt will be independent in HEP for balance/vestibular exercises and postural exercises.    Baseline reports indepedence, completing 3-4x/week    Time 4    Period Weeks    Status On-going    Target Date 02/17/20      PT LONG TERM GOAL #4   Title Improve FGA by at least 4 points to demo improved balance with walking.  UPDATED LTG:  INCREASE SCORE TO >/= 27/30 TO DEMO IMPROVED BALANCE WITH GAIT.    Baseline 17/30; 24/30    Time 4    Period Weeks    Status Revised    Target Date 02/17/20                 Plan - 01/30/20 1016    Clinical Impression Statement Today's skilled PT session focused on continued balance training on complaint surfaces and with narrow BOS. Progressed SLS activites as tolerated. Paitent still require CGA and intermittent UE support due to imbalance. Will continue to benefit from skilled PT services to progress toward all unmet LTGs.    Personal Factors and Comorbidities Comorbidity 2;Time since onset of injury/illness/exacerbation;Past/Current Experience    Comorbidities h/o small bowel obstruction, HTN, Parkinsonism,  Lt distal ICA aneurysm    Examination-Activity Limitations Locomotion Level;Bend;Squat;Transfers;Reach Overhead    Examination-Participation Restrictions Yard Work;Meal Prep;Shop;Community Activity;Interpersonal Relationship;Other   playing golf   Stability/Clinical Decision Making Evolving/Moderate complexity    Rehab Potential Good    PT Frequency 2x / week    PT Duration 4 weeks   will reassess and continue if pt progressing   PT Treatment/Interventions ADLs/Self Care Home Management;Balance training;Vestibular;Gait training;Therapeutic exercise;Therapeutic activities;Neuromuscular re-education;Patient/family education    PT Next Visit Plan Cont  with balance/vestibular exercises and high level gait activities. Add 3/4th tandem complaint surfaces to HEP. Review and update HEP as needed.    PT Home Exercise Plan tandem, SLS & cervical retraction issued on 12-20-19; Medbridge Code: [id number]    Consulted and Agree with Plan of Care Patient           Patient will benefit from skilled therapeutic intervention in order to improve the following deficits and impairments:  Abnormal gait, Dizziness, Decreased balance, Impaired vision/preception, Postural dysfunction  Visit Diagnosis: Other abnormalities of gait and mobility  Unsteadiness on feet  Abnormal posture  Dizziness and giddiness     Problem List Patient Active Problem List   Diagnosis Date Noted  . SBO (small bowel obstruction) ([place]) 07/06/2019    Jones Bales, PT, DPT 01/30/2020, 11:58 AM  [facility] [address] [place], [place], [address] Phone: [phone]   Fax:  [phone]  Name: Cody Dickerson MRN: [id number] Date of Birth: 1950/05/12

## 2020-02-06 DIAGNOSIS — R972 Elevated prostate specific antigen [PSA]: Secondary | ICD-10-CM | POA: Diagnosis not present

## 2020-02-06 DIAGNOSIS — Z1159 Encounter for screening for other viral diseases: Secondary | ICD-10-CM | POA: Diagnosis not present

## 2020-02-06 DIAGNOSIS — R2689 Other abnormalities of gait and mobility: Secondary | ICD-10-CM | POA: Diagnosis not present

## 2020-02-06 DIAGNOSIS — R41840 Attention and concentration deficit: Secondary | ICD-10-CM | POA: Diagnosis not present

## 2020-02-06 DIAGNOSIS — R634 Abnormal weight loss: Secondary | ICD-10-CM | POA: Diagnosis not present

## 2020-02-07 ENCOUNTER — Ambulatory Visit: Payer: Medicare Other | Attending: Otolaryngology | Admitting: Physical Therapy

## 2020-02-07 ENCOUNTER — Other Ambulatory Visit: Payer: Self-pay

## 2020-02-07 DIAGNOSIS — R2681 Unsteadiness on feet: Secondary | ICD-10-CM | POA: Diagnosis not present

## 2020-02-07 DIAGNOSIS — R2689 Other abnormalities of gait and mobility: Secondary | ICD-10-CM

## 2020-02-07 NOTE — Patient Instructions (Signed)
Feet Heel-Toe "Tandem", Head Motion - Eyes Open    With eyes open, right foot directly in front of the other, move head slowly: up and down. Repeat __1-2_ times per session. Do __1__ sessions per day. Stand in each position - hold for 30 secs ; do 5 horizontal head turns and then 5 vertical head turns    Feet Heel-Toe "Tandem" (Compliant Surface) Head Motion - Eyes Open;  DO NOT DO HEAD MOTION YET - JUST STAND IN PARTIAL TANDEM STANCE    With eyes open, standing on compliant surface: __ pillows______, right foot directly in front of the other, move head slowly: up and down. Repeat _2___ times per session. Do _1___ sessions per day.  Copyright  VHI. All rights reserved.

## 2020-02-08 ENCOUNTER — Encounter: Payer: Self-pay | Admitting: Physical Therapy

## 2020-02-08 NOTE — Therapy (Signed)
[facility] [address] [place], [place], [address] Phone: [phone]   Fax:  [phone]  Physical Therapy Treatment  Patient Details  Name: Cody Dickerson MRN: [id number] Date of Birth: [birth year] Referring Provider (PT): Dr. Leta Baptist   Encounter Date: 02/07/2020   PT End of Session - 02/08/20 1543    Visit Number 12    Number of Visits 14    Date for PT Re-Evaluation 02/17/20    Authorization Type Medicare/ [employer]    Authorization Time Period 12-20-19 - 02-19-20; 01-16-20 - 03-02-20    PT Start Time 0932    PT Stop Time 1015    PT Time Calculation (min) 43 min    Equipment Utilized During Treatment --   harness vest used with SOT on balance master   Activity Tolerance Patient tolerated treatment well    Behavior During Therapy [facility] for tasks assessed/performed           Past Medical History:  Diagnosis Date  . Balance problem   . Gout   . Hypercholesterolemia   . Hypertension   . Small bowel obstruction [facility])     Past Surgical History:  Procedure Laterality Date  . BOWEL BLOCKAGE  05/2019    There were no vitals filed for this visit.   Subjective Assessment - 02/07/20 0929    Subjective Pt reports no changes since previous session;  continues to report he doesn't feel he is getting much better    Pertinent History Parkinsonism, Lt distal ICA aneurysm, HTN, chronic dizziness and imbalance    Diagnostic tests DaTscan, CT angiogram ; abnormal ocular motility test per Dr. Benjamine Mola    Patient Stated Goals "hopefully to get steady on my feet"    Currently in Pain? No/denies                                  Balance Exercises - 02/08/20 0001      Balance Exercises: Standing   Standing Eyes Opened Narrow base of support (BOS);Wide (BOA);Head turns;Foam/compliant surface;5 reps   horizontal and vertical head turns   Standing Eyes Opened Limitations standing on Airex    Standing Eyes  Closed Narrow base of support (BOS);Wide (BOA);Head turns;Foam/compliant surface;5 reps   horizontal and vertical head turns   Rockerboard Anterior/posterior;EO;EC;10 reps;UE support    Partial Tandem Stance Eyes open;Foam/compliant surface;5 reps   head turns   Marching Solid surface;10 reps   on incline - with horizontal and vertical head turns   Other Standing Exercises tap ups to 1st step 5 reps each ; tap ups to 2nd step 5 reps each with minimal UE support     Other Standing Exercises Comments Pt performed marching on incline and then on decline; EO with no head turns;  EO with horizontal and vertical head turns:  marching with EC - no head turns with min to mod assist with LOB             PT Education - 02/08/20 1542    Education Details added standing on floor in partial tandem stance - with head turns; standing on compliant surface partial tandem without head turns    Person(s) Educated Patient    Methods Explanation;Demonstration;Handout    Comprehension Verbalized understanding;Returned demonstration            PT Short Term Goals - 12/20/19 2235      PT SHORT TERM  GOAL #1   Title same as LTG's             PT Long Term Goals - 02/08/20 1551      PT LONG TERM GOAL #1   Title Pt will improve DHI score from 16% to </= 10% to demo improvement in dizziness.  UPDATED GOAL; INCREASE DHI SCORE TO </= 6%    Baseline 16%, 10% on 11/12    Time 4    Period Weeks    Status Revised      PT LONG TERM GOAL #2   Title Improve SOT composite score by at least 10 points to demo improved balance for reduced fall risk.; UPDATED LTG;  INCREASE VESTIBULAR INPUT TO WNL'S ON SOT    Time 4    Period Weeks    Status Revised      PT LONG TERM GOAL #3   Title Pt will be independent in HEP for balance/vestibular exercises and postural exercises.    Baseline reports indepedence, completing 3-4x/week    Time 4    Period Weeks    Status On-going      PT LONG TERM GOAL #4   Title  Improve FGA by at least 4 points to demo improved balance with walking.  UPDATED LTG:  INCREASE SCORE TO >/= 27/30 TO DEMO IMPROVED BALANCE WITH GAIT.    Baseline 17/30; 24/30    Time 4    Period Weeks    Status Revised                 Plan - 02/08/20 1544    Clinical Impression Statement Skilled session focused on compliant surface training with head turns to increase vestibular input in maintaining balance.  Pt does well at maintaining balance with EO and EC on compliant surface, but states he does not feel that he is making progress and improving balance.  Cont with POC.    Personal Factors and Comorbidities Comorbidity 2;Time since onset of injury/illness/exacerbation;Past/Current Experience    Comorbidities h/o small bowel obstruction, HTN, Parkinsonism, Lt distal ICA aneurysm    Examination-Activity Limitations Locomotion Level;Bend;Squat;Transfers;Reach Overhead    Examination-Participation Restrictions Yard Work;Meal Prep;Shop;Community Activity;Interpersonal Relationship;Other   playing golf   Stability/Clinical Decision Making Evolving/Moderate complexity    Rehab Potential Good    PT Frequency 2x / week    PT Duration 4 weeks   will reassess and continue if pt progressing   PT Treatment/Interventions ADLs/Self Care Home Management;Balance training;Vestibular;Gait training;Therapeutic exercise;Therapeutic activities;Neuromuscular re-education;Patient/family education    PT Next Visit Plan Cont with balance/vestibular exercises and high level gait activities. Add 3/4th tandem complaint surfaces to HEP. Review and update HEP as needed.    PT Home Exercise Plan tandem, SLS & cervical retraction issued on 12-20-19; Medbridge Code: [id number]    Consulted and Agree with Plan of Care Patient           Patient will benefit from skilled therapeutic intervention in order to improve the following deficits and impairments:  Abnormal gait, Dizziness, Decreased balance, Impaired  vision/preception, Postural dysfunction  Visit Diagnosis: Other abnormalities of gait and mobility  Unsteadiness on feet     Problem List Patient Active Problem List   Diagnosis Date Noted  . SBO (small bowel obstruction) ([place]) 07/06/2019    Briselda Naval, [place], PT 02/08/2020, 3:52 PM  [facility] [address] [place], [place], [address] Phone: [phone]   Fax:  [phone]  Name: MIECZYSLAW STAMAS MRN: [id number] Date  of Birth: 07/21/1950

## 2020-02-13 ENCOUNTER — Other Ambulatory Visit: Payer: Self-pay

## 2020-02-13 ENCOUNTER — Ambulatory Visit: Payer: Medicare Other | Admitting: Physical Therapy

## 2020-02-13 DIAGNOSIS — R2681 Unsteadiness on feet: Secondary | ICD-10-CM

## 2020-02-13 DIAGNOSIS — R2689 Other abnormalities of gait and mobility: Secondary | ICD-10-CM | POA: Diagnosis not present

## 2020-02-14 ENCOUNTER — Encounter: Payer: Self-pay | Admitting: Physical Therapy

## 2020-02-14 NOTE — Therapy (Signed)
[facility] [address] [place], [place], [address] Phone: [phone]   Fax:  [phone]  Physical Therapy Treatment  Patient Details  Name: Cody Dickerson MRN: [id number] Date of Birth: 08-11-1950 Referring Provider (PT): Dr. Leta Baptist   Encounter Date: 02/13/2020   PT End of Session - 02/14/20 2009    Visit Number 13    Number of Visits 14    Date for PT Re-Evaluation 02/17/20    Authorization Type Medicare/ AARP    Authorization Time Period 12-20-19 - 02-19-20; 01-16-20 - 03-02-20    PT Start Time 0935    PT Stop Time 1017    PT Time Calculation (min) 42 min    Equipment Utilized During Treatment Gait belt   harness vest used with SOT on balance master   Activity Tolerance Patient tolerated treatment well    Behavior During Therapy [facility] for tasks assessed/performed           Past Medical History:  Diagnosis Date  . Balance problem   . Gout   . Hypercholesterolemia   . Hypertension   . Small bowel obstruction [facility])     Past Surgical History:  Procedure Laterality Date  . BOWEL BLOCKAGE  05/2019    There were no vitals filed for this visit.   Subjective Assessment - 02/14/20 1959    Subjective Pt reports no changes or problems - feels he is doing about the same; understands that 1 more PT appt is scheduled after today - plan D/C at that time    Pertinent History Parkinsonism, Lt distal ICA aneurysm, HTN, chronic dizziness and imbalance    Diagnostic tests DaTscan, CT angiogram ; abnormal ocular motility test per Dr. Benjamine Mola    Patient Stated Goals "hopefully to get steady on my feet"    Currently in Pain? No/denies                             [facility] Adult PT Treatment/Exercise - 02/14/20 0001      Transfers   Transfers Sit to Stand;Stand to Sit    Sit to Stand 5: Supervision    Stand to Sit 5: Supervision    Number of Reps Other reps (comment)   5 reps from high/low mat table    Comments no UE support; feet on blue Airex      Ambulation/Gait   Ambulation/Gait Yes    Ambulation/Gait Assistance 5: Supervision    Ambulation Distance (Feet) 250 Feet    Gait Pattern Within Functional Limits    Ambulation Surface Level;Indoor    Stairs Yes    Stairs Assistance 6: Modified independent (Device/Increase time)    Stair Management Technique One rail Right;Alternating pattern;Forwards    Number of Stairs 4    Height of Stairs 6      High Level Balance   High Level Balance Activities Sudden stops;Head turns;Marching forwards;Tandem walking    High Level Balance Comments amb. activities performed on floor, red and blue mat surfaces for compliant surface training; pt performed amb. tossing ball straight up for approx. 80' with CGA          NeuroRe-ed:  Rockerboard Anterior/posterior;EO;EC;10 reps;UE support   Partial Tandem Stance Eyes open;Foam/compliant surface;5 reps   head turns  Marching Solid surface;10 reps   on incline - with horizontal and vertical head turns  Other Standing Exercises tap ups to 1st step 5 reps each ; tap ups  to 2nd step 5 reps each with minimal UE support    Other Standing Exercises Comments Pt performed marching on incline and then on decline; EO with no head turns;  EO with horizontal and vertical head turns:  marching with EC - no head turns with min to mod assist with LOB             PT Short Term Goals - 12/20/19 2235      PT SHORT TERM GOAL #1   Title same as LTG's             PT Long Term Goals - 02/14/20 2013      PT LONG TERM GOAL #1   Title Pt will improve DHI score from 16% to </= 10% to demo improvement in dizziness.  UPDATED GOAL; INCREASE DHI SCORE TO </= 6%    Baseline 16%, 10% on 11/12    Time 4    Period Weeks    Status Revised      PT LONG TERM GOAL #2   Title Improve SOT composite score by at least 10 points to demo improved balance for reduced fall risk.; UPDATED LTG;  INCREASE VESTIBULAR INPUT TO WNL'S  ON SOT    Time 4    Period Weeks    Status Revised      PT LONG TERM GOAL #3   Title Pt will be independent in HEP for balance/vestibular exercises and postural exercises.    Baseline reports indepedence, completing 3-4x/week    Time 4    Period Weeks    Status On-going      PT LONG TERM GOAL #4   Title Improve FGA by at least 4 points to demo improved balance with walking.  UPDATED LTG:  INCREASE SCORE TO >/= 27/30 TO DEMO IMPROVED BALANCE WITH GAIT.    Baseline 17/30; 24/30    Time 4    Period Weeks    Status Revised                 Plan - 02/14/20 2010    Clinical Impression Statement Pt is progressing well towards goals; pt is able to perform balance/vestibular exercises with CGA for safety, however, he states he does not feel he is making significant progress. Plan D/C next session.    Personal Factors and Comorbidities Comorbidity 2;Time since onset of injury/illness/exacerbation;Past/Current Experience    Comorbidities h/o small bowel obstruction, HTN, Parkinsonism, Lt distal ICA aneurysm    Examination-Activity Limitations Locomotion Level;Bend;Squat;Transfers;Reach Overhead    Examination-Participation Restrictions Yard Work;Meal Prep;Shop;Community Activity;Interpersonal Relationship;Other   playing golf   Stability/Clinical Decision Making Evolving/Moderate complexity    Rehab Potential Good    PT Frequency 2x / week    PT Duration 4 weeks   will reassess and continue if pt progressing   PT Treatment/Interventions ADLs/Self Care Home Management;Balance training;Vestibular;Gait training;Therapeutic exercise;Therapeutic activities;Neuromuscular re-education;Patient/family education    PT Next Visit Plan check LTG's - D/C next session    PT Home Exercise Plan tandem, SLS & cervical retraction issued on 12-20-19; Medbridge Code: [id number]    Consulted and Agree with Plan of Care Patient           Patient will benefit from skilled therapeutic intervention in order  to improve the following deficits and impairments:  Abnormal gait,Dizziness,Decreased balance,Impaired vision/preception,Postural dysfunction  Visit Diagnosis: Other abnormalities of gait and mobility  Unsteadiness on feet     Problem List Patient Active Problem List   Diagnosis Date Noted  . SBO (  small bowel obstruction) ([place]) 07/06/2019    Yan Pankratz, [place], PT 02/14/2020, 8:14 PM  [facility] [address] [place], [place], [address] Phone: [phone]   Fax:  [phone]  Name: ELIE LEPPO MRN: [id number] Date of Birth: 08/02/1950

## 2020-02-20 ENCOUNTER — Encounter: Payer: Self-pay | Admitting: Physical Therapy

## 2020-02-20 ENCOUNTER — Telehealth (HOSPITAL_COMMUNITY): Payer: Self-pay | Admitting: Radiology

## 2020-02-20 ENCOUNTER — Other Ambulatory Visit: Payer: Self-pay

## 2020-02-20 ENCOUNTER — Ambulatory Visit: Payer: Medicare Other | Admitting: Physical Therapy

## 2020-02-20 DIAGNOSIS — R2681 Unsteadiness on feet: Secondary | ICD-10-CM

## 2020-02-20 DIAGNOSIS — R2689 Other abnormalities of gait and mobility: Secondary | ICD-10-CM | POA: Diagnosis not present

## 2020-02-20 NOTE — Telephone Encounter (Signed)
Called pt, left VM. Pt referred by Dr. Carles Collet for brain aneurysm for Cody Dickerson

## 2020-02-20 NOTE — Progress Notes (Signed)
Assessment/Plan:   1.  Left distal supraclinoid ICA aneurysm             -Incidental, 2 mm.    Patient had wanted referral to Dr. Estanislado Pandy, which we did back in June.  They attempted to call the patient multiple times, according to his office, and phone call was never returned.  Pt states that they never contacted him but his sister works at [employer] with a neurosx and sent his images and the neurosx told him that there was nothing more to do.  We will repeat the scan next summer (2022).  2.  Parkinsonism             -DaTscan was relatively equivocal  -meeting more clinical criteria now             -will start carbidopa/levodopa 25/100 and work to 1 tablet tid.  R/B/SE were discussed.  The opportunity to ask questions was given and they were answered to the best of my ability.  The patient expressed understanding and willingness to follow the outlined treatment protocols.  -information given to Parkinsons Disease biking programs  -exercise encouraged  3.  Memory change  -no evidence of lewy body disease  -suspect MCI  -discussed neurocog testing but he decided to hold off on that for now  4.  Discussed with patient that he could f/u here or [facility] (had referral there as well) but should pick one neuro he feels comfortable with and stick with that person for the continuity of his care.  For now, he would like to access his neuro care here.   Subjective:   Cody Dickerson was seen today in follow up for balance changes and memory changes.  His primary care thought perhaps he had Lewy body versus ADD and wanted pt seen earlier than scheduled visit and he is here for eval.  Pt states today that it is really a focus and multitasking problem.  No problems remember to actually do things.  My previous records as well as any outside records available were reviewed prior to todays visit.  Patient has been attending physical therapy recently and those notes are reviewed.  Pt has history of  supraclinoid aneurysm.  He was referred to interventional radiology after last visit.  Their office stated that they could not get a hold of the patient to schedule the appointment.  Pt states that they didn't call him but states his sister works at [employer] and had a neurosx there look at it and agreed with me that they would just monitor it and do nothing.    Today, pt states that he had been going to ENT and was told that there was no cause for his balance change and they sent him to neurorehab.  He went there.  He had one near fall (tripped over [employer]) but no other falls.  Able to trail walk without trouble.  Trouble with small and shaky handwriting.  Has tremor upon arising in the AM, L>R hand.  When pouring milk/wine from L hand, he will note tremor. It only bothers him with weight in it.  No dizziness.   PREVIOUS MEDICATIONS: none to date  CURRENT MEDICATIONS:  Outpatient Encounter Medications as of 02/21/2020  Medication Sig  . indomethacin (INDOCIN) 50 MG capsule 3 (three) times daily as needed.   Marland Kitchen losartan (COZAAR) 100 MG tablet Take 100 mg by mouth daily.   . Multiple Vitamin (MULTIVITAMIN) tablet Take 1 tablet by  mouth daily.  . rosuvastatin (CRESTOR) 20 MG tablet Take 20 mg by mouth daily.   . vitamin B-12 (CYANOCOBALAMIN) 500 MCG tablet Take 500 mcg by mouth daily.   No facility-administered encounter medications on file as of 02/21/2020.     Objective:   PHYSICAL EXAMINATION:    VITALS:   Vitals:   02/21/20 0925  BP: 138/76  Pulse: 61  SpO2: 95%  Weight: 172 lb (78 kg)  Height: 5' 8.5" (1.74 m)    GEN:  The patient appears stated age and is in NAD. HEENT:  Normocephalic, atraumatic.  The mucous membranes are moist. The superficial temporal arteries are without ropiness or tenderness. CV:  RRR Lungs:  CTAB Neck/HEME:  There are no carotid bruits bilaterally.  Neurological examination:  Orientation: The patient is alert and oriented x3. Cranial nerves:  There is mild facial hypomimia.The speech is fluent and clear. Soft palate rises symmetrically and there is no tongue deviation. Hearing is intact to conversational tone. Sensation: Sensation is intact to light touch throughout Motor: Strength is at least antigravity x4.  Movement examination: Tone: There is mild increased tone in the UE bilaterally but he also has trouble relaxing Abnormal movements:  no tremor.  No myoclonus.  No asterixis.   Coordination:  There is mild decremation on the L, mostly with foot taps Gait and Station: The patient has no difficulty arising out of a deep-seated chair without the use of the hands. The patient's stride length is good but he is flexed at the waist    Total time spent on today's visit was 45 minutes, including both face-to-face time and nonface-to-face time.  Time included that spent on review of records (prior notes available to me/labs/imaging if pertinent), discussing treatment and goals, answering patient's questions and coordinating care.  Cc:  Lawerance Cruel, MD

## 2020-02-21 ENCOUNTER — Ambulatory Visit (INDEPENDENT_AMBULATORY_CARE_PROVIDER_SITE_OTHER): Payer: Medicare Other | Admitting: Neurology

## 2020-02-21 ENCOUNTER — Encounter: Payer: Self-pay | Admitting: Neurology

## 2020-02-21 VITALS — BP 138/76 | HR 61 | Ht 68.5 in | Wt 172.0 lb

## 2020-02-21 DIAGNOSIS — R413 Other amnesia: Secondary | ICD-10-CM

## 2020-02-21 DIAGNOSIS — I671 Cerebral aneurysm, nonruptured: Secondary | ICD-10-CM | POA: Diagnosis not present

## 2020-02-21 DIAGNOSIS — G20 Parkinson's disease: Secondary | ICD-10-CM | POA: Diagnosis not present

## 2020-02-21 MED ORDER — CARBIDOPA-LEVODOPA 25-100 MG PO TABS: 1.0000 | ORAL_TABLET | Freq: Three times a day (TID) | ORAL | 1 refills | Status: DC

## 2020-02-21 NOTE — Patient Instructions (Signed)
1.  Start Carbidopa Levodopa as follows:  Take 1/2 tablet three times daily, at least 30 minutes before meals (approximately 7am/11am/4pm), for one week  Then take 1/2 tablet in the morning, 1/2 tablet in the afternoon, 1 tablet in the evening, at least 30 minutes before meals, for one week  Then take 1/2 tablet in the morning, 1 tablet in the afternoon, 1 tablet in the evening, at least 30 minutes before meals, for one week  Then take 1 tablet three times daily at 7am/11am/4pm, at least 30 minutes before meals   As a reminder, carbidopa/levodopa can be taken at the same time as a carbohydrate, but we like to have you take your pill either 30 minutes before a protein source or 1 hour after as protein can interfere with carbidopa/levodopa absorption.   2.  We discussed memory testing and have decided to hold for now.  We can readdress that in the future.

## 2020-02-21 NOTE — Therapy (Signed)
[facility] [address] [place], [place], [address] Phone: [phone]   Fax:  [phone]  Physical Therapy Treatment & Discharge Summary  Patient Details  Name: Cody Dickerson MRN: [id number] Date of Birth: 16-Aug-1950 Referring Provider (PT): Dr. Leta Baptist                                Cc: Dr. Lawerance Cruel  Encounter Date: 02/20/2020   PT End of Session - 02/21/20 2048    Visit Number 14    Number of Visits 14    Date for PT Re-Evaluation 02/17/20    Authorization Type Medicare/ [employer]    Authorization Time Period 12-20-19 - 02-19-20; 01-16-20 - 03-02-20    PT Start Time 0936    PT Stop Time 1020    PT Time Calculation (min) 44 min    Equipment Utilized During Treatment Gait belt   harness vest used with SOT on balance master   Activity Tolerance Patient tolerated treatment well    Behavior During Therapy [facility] for tasks assessed/performed           Past Medical History:  Diagnosis Date  . Balance problem   . Gout   . Hypercholesterolemia   . Hypertension   . Small bowel obstruction [facility])     Past Surgical History:  Procedure Laterality Date  . BOWEL BLOCKAGE  05/2019    There were no vitals filed for this visit.       [facility] PT Assessment - 02/21/20 0001      Functional Gait  Assessment   Gait assessed  Yes    Gait Level Surface Walks 20 ft in less than 5.5 sec, no assistive devices, good speed, no evidence for imbalance, normal gait pattern, deviates no more than 6 in outside of the 12 in walkway width.    Change in Gait Speed Able to smoothly change walking speed without loss of balance or gait deviation. Deviate no more than 6 in outside of the 12 in walkway width.    Gait with Horizontal Head Turns Performs head turns smoothly with slight change in gait velocity (eg, minor disruption to smooth gait path), deviates 6-10 in outside 12 in walkway width, or uses an assistive device.    Gait with  Vertical Head Turns Performs task with slight change in gait velocity (eg, minor disruption to smooth gait path), deviates 6 - 10 in outside 12 in walkway width or uses assistive device    Gait and Pivot Turn Pivot turns safely in greater than 3 sec and stops with no loss of balance, or pivot turns safely within 3 sec and stops with mild imbalance, requires small steps to catch balance.    Step Over Obstacle Is able to step over 2 stacked shoe boxes taped together (9 in total height) without changing gait speed. No evidence of imbalance.    Gait with Narrow Base of Support Is able to ambulate for 10 steps heel to toe with no staggering.    Gait with Eyes Closed Walks 20 ft, uses assistive device, slower speed, mild gait deviations, deviates 6-10 in outside 12 in walkway width. Ambulates 20 ft in less than 9 sec but greater than 7 sec.    Ambulating Backwards Walks 20 ft, no assistive devices, good speed, no evidence for imbalance, normal gait    Steps Alternating feet, no rail.    Total  Score 26                         [facility] Adult PT Treatment/Exercise - 02/21/20 0001      Ambulation/Gait   Ambulation/Gait Yes    Gait velocity 4.33 ft/sec    Stairs Yes    Stairs Assistance 6: Modified independent (Device/Increase time)    Stair Management Technique One rail Right;Alternating pattern;Forwards    Number of Stairs 4    Height of Stairs 6    Gait Comments pt able to negotiate 4 steps without use of hand rail but some mild unsteadiness noted without use of rail      Standardized Balance Assessment   Standardized Balance Assessment Timed Up and Go Test      Timed Up and Go Test   TUG Normal TUG    Normal TUG (seconds) 10.25               Balance Exercises - 02/21/20 0001      Balance Exercises: Standing   Standing Eyes Opened Narrow base of support (BOS);Wide (BOA);Head turns;Foam/compliant surface;5 reps   horizontal and vertical   Standing Eyes Opened Limitations  standing on Airex    Standing Eyes Closed Narrow base of support (BOS);Wide (BOA);Head turns;Foam/compliant surface;5 reps   horizontal and vertical   Marching Solid surface;10 reps   on incline - with horizontal and vertical head turns   Other Standing Exercises tap ups to 1st step 5 reps each ; tap ups to 2nd step 5 reps each with minimal UE support     Other Standing Exercises Comments Marching on Airex with EO and then added horizontal and vertical head turns 5 reps each; marching with EC; then marching with EC with horizontal head turns 5 reps, then with vertical head turns 5 reps               PT Short Term Goals - 12/20/19 2235      PT SHORT TERM GOAL #1   Title same as LTG's             PT Long Term Goals - 02/20/20 0943      PT LONG TERM GOAL #1   Title Pt will improve DHI score from 16% to </= 10% to demo improvement in dizziness.  UPDATED GOAL; INCREASE DHI SCORE TO </= 6%    Baseline 16%, 10% on 11/12; pt states his problem is balance - not really dizziness -- 02-20-20    Time 4    Period Weeks    Status Deferred      PT LONG TERM GOAL #2   Title Improve SOT composite score by at least 10 points to demo improved balance for reduced fall risk.; UPDATED LTG;  INCREASE VESTIBULAR INPUT TO WNL'S ON SOT    Baseline test deferred due to time constraints    Time 4    Period Weeks    Status Deferred      PT LONG TERM GOAL #3   Title Pt will be independent in HEP for balance/vestibular exercises and postural exercises.    Baseline reports indepedence, completing 3-4x/week    Time 4    Period Weeks    Status Achieved      PT LONG TERM GOAL #4   Title Improve FGA by at least 4 points to demo improved balance with walking.  UPDATED LTG:  INCREASE SCORE TO >/= 27/30 TO DEMO IMPROVED BALANCE WITH GAIT.  Baseline 17/30; 24/30; score 26/30 on 02-20-20    Time 4    Period Weeks    Status Partially Met                 Plan - 02/21/20 2039    Clinical  Impression Statement Pt has met LTG #3 (independent with HEP); LTG's #1 & 2 deferred as pt reports no dizziness at this time, but rather imbalance.  LTG #4 is partially met as his FGA score has increased from 17/30 to 26/30, however, updated LTG not fully met as goal is for score of at least 27/30.  Pt has achieved gains in balance per objective balance tests, however, pt reports he does not feel that his balance has significantly improved.  Pt is discharged due to plateau in functional progress at this time also due to pt's request to discharge from PT.  Pt states he is waiting to see neurologist to determine the cause of his imbalance and dysequilibrium.    Personal Factors and Comorbidities Comorbidity 2;Time since onset of injury/illness/exacerbation;Past/Current Experience    Comorbidities h/o small bowel obstruction, HTN, Parkinsonism, Lt distal ICA aneurysm    Examination-Activity Limitations Locomotion Level;Bend;Squat;Transfers;Reach Overhead    Examination-Participation Restrictions Yard Work;Meal Prep;Shop;Community Activity;Interpersonal Relationship;Other   playing golf   Stability/Clinical Decision Making Evolving/Moderate complexity    Rehab Potential Good    PT Frequency 2x / week    PT Duration 4 weeks   will reassess and continue if pt progressing   PT Treatment/Interventions ADLs/Self Care Home Management;Balance training;Vestibular;Gait training;Therapeutic exercise;Therapeutic activities;Neuromuscular re-education;Patient/family education    PT Next Visit Plan check LTG's    PT Home Exercise Plan tandem, SLS & cervical retraction issued on 12-20-19; Medbridge Code: [id number]    Consulted and Agree with Plan of Care Patient           Patient will benefit from skilled therapeutic intervention in order to improve the following deficits and impairments:  Abnormal gait,Dizziness,Decreased balance,Impaired vision/preception,Postural dysfunction  Visit Diagnosis: Other abnormalities  of gait and mobility  Unsteadiness on feet     Problem List Patient Active Problem List   Diagnosis Date Noted  . SBO (small bowel obstruction) ([place]) 07/06/2019      PHYSICAL THERAPY DISCHARGE SUMMARY  Visits from Start of Care: 14  Current functional level related to goals / functional outcomes: See above for progress towards goals   Remaining deficits: Decreased high level balance skills and decreased high level gait Mild postural deviations   Education / Equipment: Pt has been instructed in HEP for balance and postural retraining Plan: Patient agrees to discharge.  Patient goals were partially met. Patient is being discharged due to the patient's request.  ?????    Pt is also discharged due to plateau in maximizing functional status at this time.  Pt states he wants to know the cause of his imbalance/dysequilibrium - is waiting for appt with neurologist.   Alda Lea, PT 02/21/2020, 8:50 PM  [facility] [address] [place], [place], [address] Phone: [phone]   Fax:  [phone]  Name: Cody Dickerson MRN: [id number] Date of Birth: 1951-02-20

## 2020-04-10 DIAGNOSIS — E7800 Pure hypercholesterolemia, unspecified: Secondary | ICD-10-CM | POA: Diagnosis not present

## 2020-04-10 DIAGNOSIS — R41840 Attention and concentration deficit: Secondary | ICD-10-CM | POA: Diagnosis not present

## 2020-05-03 ENCOUNTER — Telehealth: Payer: Self-pay | Admitting: Neurology

## 2020-05-03 NOTE — Telephone Encounter (Signed)
Spoke with patient and he is agreeable with having CTA. I advised him that I would place the order and the schedulers at [facility] would be in touch with him. He voiced understanding.  Patient states he has been on Carbidopa levodopa since December and has not noticed any difference. He states his imbalance is still the same and wants to know what to do. I advised patient that I would speak with Dr Tat and get back to him. He voiced understanding.

## 2020-05-03 NOTE — Telephone Encounter (Signed)
Let pt know it is time to repeat the CTA brain to look at the small aneurysm he has.  That way it will be back before his appt time.  Please schedule if pt agreeable.

## 2020-05-03 NOTE — Telephone Encounter (Signed)
Patient notified and voiced understanding.

## 2020-05-03 NOTE — Telephone Encounter (Signed)
I know he feels that (he called previous).  We will address at his visit.

## 2020-05-24 ENCOUNTER — Telehealth: Payer: Self-pay

## 2020-05-24 DIAGNOSIS — I671 Cerebral aneurysm, nonruptured: Secondary | ICD-10-CM

## 2020-05-24 NOTE — Telephone Encounter (Signed)
Patient called this morning wanting to check the status of his CTA of the Brain. Advised him that I was going to work on it and I would get back to him. He voiced understanding.    Called [facility] and got CT scheduled for the patient.   CTA is scheduled for Monday 06/11/2020 at 1:40pm with an 1:20pm arrival at the [place] location (near the gas station). With restrictions of no food four hours prior to appointment.   Patient made aware and voiced understanding.

## 2020-06-11 ENCOUNTER — Ambulatory Visit
Admission: RE | Admit: 2020-06-11 | Discharge: 2020-06-11 | Disposition: A | Discharge status: Home / Self Care | Payer: Medicare Other | Source: Ambulatory Visit | Attending: Neurology | Admitting: Neurology

## 2020-06-11 ENCOUNTER — Other Ambulatory Visit: Payer: Self-pay

## 2020-06-11 DIAGNOSIS — I720 Aneurysm of carotid artery: Secondary | ICD-10-CM | POA: Diagnosis not present

## 2020-06-11 IMAGING — CT CT ANGIO HEAD
2 of 4 series · 6 of 30 positions shown · IV contrast (APPLIED)
Comparison: July 25, 2019.

CLINICAL DATA: Small cerebral aneurysm.

EXAM:
CT ANGIOGRAPHY HEAD
TECHNIQUE: Multidetector CT imaging of the head was performed using the
standard protocol during bolus administration of intravenous
contrast. Multiplanar CT image reconstructions and MIPs were
obtained to evaluate the vascular anatomy.
CONTRAST:  75mL UTWM1A-JZO IOPAMIDOL (UTWM1A-JZO) INJECTION 76%

[Series 4: head w/(date) · axial · 0.47mm/px · z∈[-114,-54]mm · 2 of 37 slices shown]
[im 13/37  brain]
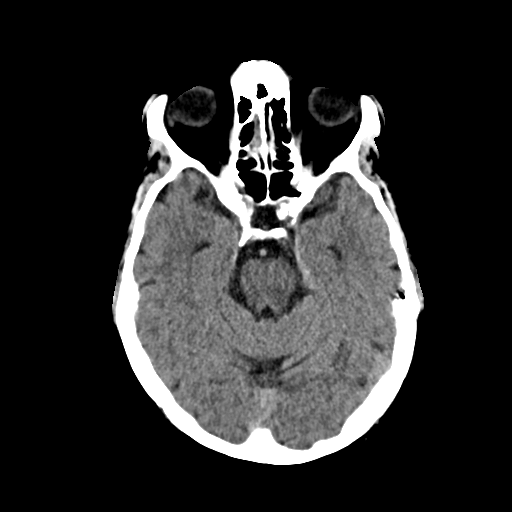
[im 25/37  brain]
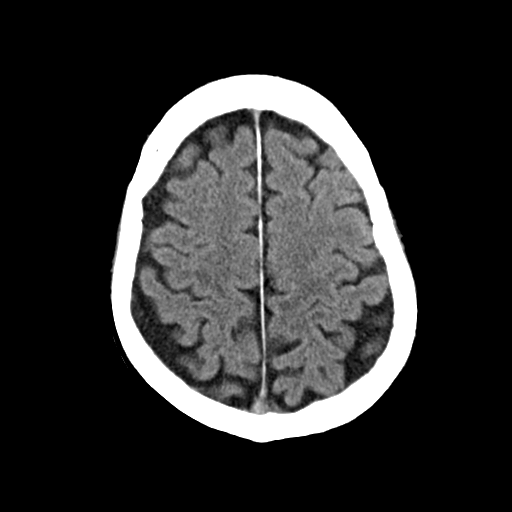

[Series 7: head angio · axial · 0.47mm/px · z∈[-141,-33]mm · 4 of 61 slices shown]
[im 13/61  brain]
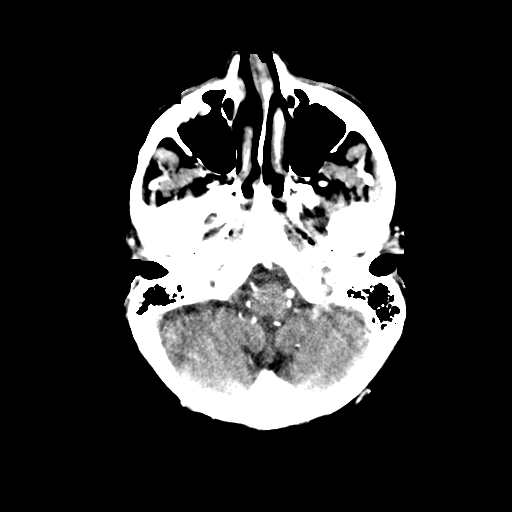
[im 25/61  bone]
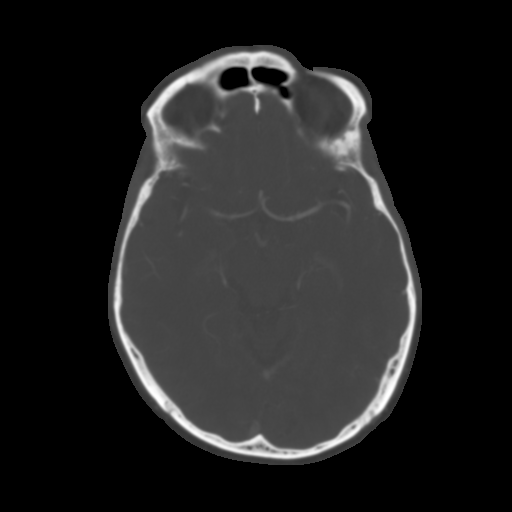
[im 37/61  brain]
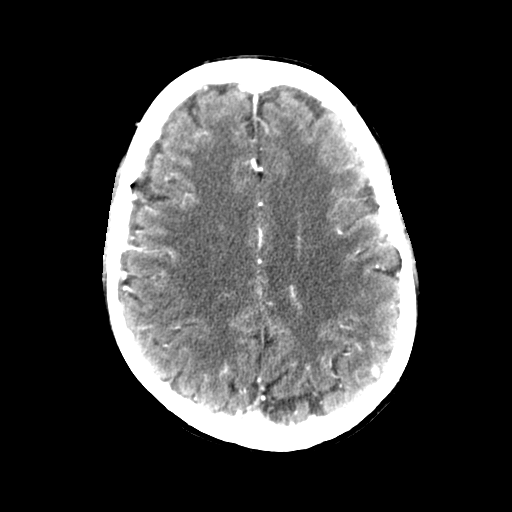
[im 49/61  bone]
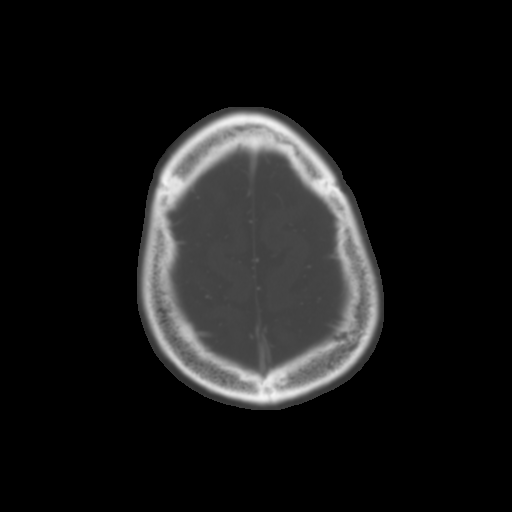

[6 of 30 positions shown; findings below may reference images not displayed]

FINDINGS: CT HEAD

Brain: No evidence of acute infarction, hemorrhage, hydrocephalus,
extra-axial collection or mass lesion/mass effect.

Skull: No acute fracture.

Vascular: Calcific intracranial atherosclerosis.

Sinuses: Clear sinuses.  Unremarkable orbits.

Other: No mastoid effusions.

CTA HEAD

Anterior circulation: Calcific atherosclerosis of bilateral
cavernous and paraclinoid ICAs with similar mild narrowing. No large
vessel occlusion or proximal hemodynamically significant stenosis.
Similar size/appearance of a 2 mm outpouching arising from the
distal left supraclinoid ICA in the expected region of a posterior
communicating artery origin (series 14, image 117).

Posterior circulation: Similar calcific atherosclerosis of bilateral
intradural vertebral arteries with mild stenosis. No large vessel
occlusion or proximal hemodynamically significant stenosis. Probable
small right posterior communicating artery, similar.

Venous sinuses: As permitted by contrast timing, patent.

Review of the MIP images confirms the above findings.
IMPRESSION: 1. No evidence of acute intracranial abnormality.
2. No significant intracranial stenosis.
3. Similar size/appearance of a 2 mm aneurysm of the left distal
supraclinoid ICA.

## 2020-06-11 MED ORDER — IOPAMIDOL (ISOVUE-370) INJECTION 76%
75.0000 mL | Freq: Once | INTRAVENOUS | Status: AC | PRN
  Administered 2020-06-11: 75 mL via INTRAVENOUS

## 2020-06-18 DIAGNOSIS — I10 Essential (primary) hypertension: Secondary | ICD-10-CM | POA: Diagnosis not present

## 2020-06-18 DIAGNOSIS — R2689 Other abnormalities of gait and mobility: Secondary | ICD-10-CM | POA: Diagnosis not present

## 2020-08-01 NOTE — Progress Notes (Signed)
Assessment/Plan:   1.  Parkinsons Disease  -DaTscan previously fairly equivocal, but met more criteria clinically last visit  -Continue carbidopa/levodopa 25/100, 1 tablet 3 times per day  -refer to PT  -We discussed that it used to be thought that levodopa would increase risk of melanoma but now it is believed that Parkinsons itself likely increases risk of melanoma. he is to get regular skin checks.  He sees Dr. Ronnald Ramp  2.  Left distal supraclinoid ICA aneurysm  -Small, incidental, 2 mm.  Patient had wanted referral to Dr. Estanislado Pandy, which we did back in June.  They attempted to call the patient multiple times, according to his office, and phone call was never returned.  Pt states that they never contacted him but his sister works at [employer] with a neurosx and sent his images and the neurosx told him that there was nothing more to do.    -CTA was repeated in April, 2022 and aneurysm size was stable.  3.  Memory change, suspect MCI  -we will do neurocog testing  4.  Dizziness  -don't think that this is related to Parkinsons Disease or meds, as I d/c the carbidopa/levodopa and it didn't change sx's  -asked him to f/u with PCP  5.  Depression  -start lexapro, 10 mg daily (not on indocin currently - only takes prn when has gout)  -offered counseling with lcsw.  Biggest issue here may be worry     Subjective:   Cody Dickerson was seen today in follow up for Parkinsons disease.  My previous records were reviewed prior to todays visit as well as outside records available to me.  Started levodopa last visit.  Patient reports that he is "felling about the same."    pt denies falls.  Pt emailed since last visit about dizziness and he thought that the levodopa would help it.  I told him that was not intended to help it, and potentially could make it worse.  I told him to stop it for a week and see how he did.  The dizziness was the same, so I asked him to restart the levodopa and follow-up  with primary care regarding his losartan.  This was changed to valsartan but he is still dizzy.  Pt has not noted any low BP.  No hallucinations.  Mood has been good.  Noting some mental fatigue and when asked if its depression, he reports "yes."  CTA was repeated since last visit.  Left distal supraclinoid ICA aneurysm size was stable.  C/o memory change as well  Current prescribed movement disorder medications: carbidopa/levodopa 25/100 tid   ALLERGIES:   Allergies  Allergen Reactions  . Lisinopril Cough    CURRENT MEDICATIONS:  Outpatient Encounter Medications as of 08/06/2020  Medication Sig  . carbidopa-levodopa (SINEMET IR) 25-100 MG tablet Take 1 tablet by mouth 3 (three) times daily.  . indomethacin (INDOCIN) 50 MG capsule 3 (three) times daily as needed.   . Multiple Vitamin (MULTIVITAMIN) tablet Take 1 tablet by mouth daily.  . rosuvastatin (CRESTOR) 20 MG tablet Take 20 mg by mouth daily.   . valsartan (DIOVAN) 320 MG tablet 1 tablet  . vitamin B-12 (CYANOCOBALAMIN) 500 MCG tablet Take 500 mcg by mouth daily.  . [DISCONTINUED] losartan (COZAAR) 100 MG tablet Take 100 mg by mouth daily.  (Patient not taking: No sig reported)   No facility-administered encounter medications on file as of 08/06/2020.    Objective:   PHYSICAL EXAMINATION:  VITALS:   Vitals:   08/06/20 0800  BP: (!) 144/82  Pulse: 66  SpO2: 97%  Weight: 166 lb (75.3 kg)  Height: 5' 9"  (1.753 m)     GEN:  The patient appears stated age and is in NAD. HEENT:  Normocephalic, atraumatic.  The mucous membranes are moist. The superficial temporal arteries are without ropiness or tenderness. CV:  RRR Lungs:  CTAB Neck/HEME:  There are no carotid bruits bilaterally.  Neurological examination:  Orientation: The patient is alert and oriented x3. Cranial nerves: There is good facial symmetry with facial hypomimia. The speech is fluent and clear. Soft palate rises symmetrically and there is no tongue  deviation. Hearing is intact to conversational tone. Sensation: Sensation is intact to light touch throughout Motor: Strength is at least antigravity x4.  Movement examination: Tone: There is nl tone in the UE/LE Abnormal movements: none Coordination:  There is mild decremation with RAM's, mostly on the L Gait and Station: The patient has no difficulty arising out of a deep-seated chair without the use of the hands. The patient's stride length is slightly decreased arm swing bilaterally.      Total time spent on today's visit was 30 minutes, including both face-to-face time and nonface-to-face time.  Time included that spent on review of records (prior notes available to me/labs/imaging if pertinent), discussing treatment and goals, answering patient's questions and coordinating care.  Cc:  Lawerance Cruel, MD

## 2020-08-03 ENCOUNTER — Ambulatory Visit: Payer: Medicare Other | Admitting: Neurology

## 2020-08-06 ENCOUNTER — Encounter: Payer: Self-pay | Admitting: Neurology

## 2020-08-06 ENCOUNTER — Ambulatory Visit (INDEPENDENT_AMBULATORY_CARE_PROVIDER_SITE_OTHER): Payer: Medicare Other | Admitting: Neurology

## 2020-08-06 ENCOUNTER — Other Ambulatory Visit: Payer: Self-pay

## 2020-08-06 VITALS — BP 144/82 | HR 66 | Ht 69.0 in | Wt 166.0 lb

## 2020-08-06 DIAGNOSIS — F330 Major depressive disorder, recurrent, mild: Secondary | ICD-10-CM | POA: Diagnosis not present

## 2020-08-06 DIAGNOSIS — R413 Other amnesia: Secondary | ICD-10-CM

## 2020-08-06 DIAGNOSIS — G20 Parkinson's disease: Secondary | ICD-10-CM | POA: Diagnosis not present

## 2020-08-06 MED ORDER — ESCITALOPRAM OXALATE 10 MG PO TABS: 10.0000 mg | ORAL_TABLET | Freq: Every day | ORAL | 1 refills | Status: DC

## 2020-08-06 NOTE — Patient Instructions (Addendum)
1.  You have been referred for a neurocognitive evaluation (i.e., evaluation of memory and thinking abilities). Please bring someone with you to this appointment if possible, as it is helpful for the neuropsychologist to hear from both you and another adult who knows you well. Please bring eyeglasses and hearing aids if you wear them.    The evaluation will take approximately 3 hours and has two parts:   . The first part is a clinical interview with the neuropsychologist, Dr. Melvyn Novas or Dr. Nicole Kindred. During the interview, the neuropsychologist will speak with you and the individual you brought to the appointment.    . The second part of the evaluation is testing with the doctor's technician, aka psychometrician, Hinton Dyer or [employer]. During the testing, the technician will ask you to remember different types of material, solve problems, and answer some questionnaires. Your family member will not be present for this portion of the evaluation.   Please note: We have to reserve several hours of the neuropsychologist's time and the psychometrician's time for your evaluation appointment. As such, there is a No-Show fee of $100. If you are unable to attend any of your appointments, please contact our office as soon as possible to reschedule.  2.  Add lexapro, 10 mg daily.  3.  You have been referred to [facility] for therapy. They will call you directly to schedule an appointment.  Please call [phone] if you do not hear from them.

## 2020-08-13 DIAGNOSIS — I10 Essential (primary) hypertension: Secondary | ICD-10-CM | POA: Diagnosis not present

## 2020-08-13 DIAGNOSIS — R531 Weakness: Secondary | ICD-10-CM | POA: Diagnosis not present

## 2020-08-13 DIAGNOSIS — R634 Abnormal weight loss: Secondary | ICD-10-CM | POA: Diagnosis not present

## 2020-08-13 DIAGNOSIS — R059 Cough, unspecified: Secondary | ICD-10-CM | POA: Diagnosis not present

## 2020-08-13 DIAGNOSIS — E7800 Pure hypercholesterolemia, unspecified: Secondary | ICD-10-CM | POA: Diagnosis not present

## 2020-08-13 DIAGNOSIS — R2689 Other abnormalities of gait and mobility: Secondary | ICD-10-CM | POA: Diagnosis not present

## 2020-08-16 ENCOUNTER — Other Ambulatory Visit: Payer: Self-pay

## 2020-08-16 ENCOUNTER — Encounter: Payer: Self-pay | Admitting: Cardiology

## 2020-08-16 ENCOUNTER — Ambulatory Visit: Payer: Medicare Other | Admitting: Cardiology

## 2020-08-16 VITALS — BP 141/79 | HR 66 | Temp 98.3°F | Resp 16 | Ht 69.0 in | Wt 166.8 lb

## 2020-08-16 DIAGNOSIS — E7800 Pure hypercholesterolemia, unspecified: Secondary | ICD-10-CM

## 2020-08-16 DIAGNOSIS — I10 Essential (primary) hypertension: Secondary | ICD-10-CM | POA: Diagnosis not present

## 2020-08-16 DIAGNOSIS — R5381 Other malaise: Secondary | ICD-10-CM | POA: Diagnosis not present

## 2020-08-16 DIAGNOSIS — R0683 Snoring: Secondary | ICD-10-CM | POA: Diagnosis not present

## 2020-08-16 DIAGNOSIS — R400 Somnolence: Secondary | ICD-10-CM | POA: Diagnosis not present

## 2020-08-16 DIAGNOSIS — R42 Dizziness and giddiness: Secondary | ICD-10-CM | POA: Diagnosis not present

## 2020-08-16 DIAGNOSIS — R011 Cardiac murmur, unspecified: Secondary | ICD-10-CM

## 2020-08-16 DIAGNOSIS — I951 Orthostatic hypotension: Secondary | ICD-10-CM

## 2020-08-16 DIAGNOSIS — R0989 Other specified symptoms and signs involving the circulatory and respiratory systems: Secondary | ICD-10-CM

## 2020-08-16 MED ORDER — AMLODIPINE BESYLATE 5 MG PO TABS: 2.5000 mg | ORAL_TABLET | Freq: Every day | ORAL | 2 refills | Status: DC

## 2020-08-16 NOTE — Progress Notes (Signed)
Primary Physician/Referring:  Lawerance Cruel, MD  Patient ID: Cody Dickerson, male    DOB: 1950-05-03, 70 y.o.   MRN: [id number]  Chief Complaint  Patient presents with   Fatigue   HPI:    DAMARCUS REGGIO  is a 70 y.o. Fairly active Caucasian male with hypertension, Parkinson's disease which is well controlled, with an extensive evaluation for dizziness, including neurology and ENT felt to be unyielding.  Now referred for cardiac evaluation.  Patient tries to remain fairly active, [facility] without any chest pain or dyspnea.  His main complaint is dizziness when he suddenly stands up and gait instability.  He also complains of marked daytime somnolence.  States that every time he has to do anything he feels extremely fatigued and has to calm down and sleep and lay down.  Denies chest pain or dyspnea or palpitations no syncope.  Past Medical History:  Diagnosis Date   Balance problem    Gout    Hypercholesterolemia    Hypertension    Small bowel obstruction [facility])    Past Surgical History:  Procedure Laterality Date   BOWEL BLOCKAGE  05/2019   Family History  Problem Relation Age of Onset   Hypertension Mother    Arthritis Father    Prostate cancer Brother     Social History   Tobacco Use   Smoking status: Former    Packs/day: 0.50    Years: 5.00    Pack years: 2.50    Types: Cigarettes    Quit date: 1980    Years since quitting: 42.4   Smokeless tobacco: Never  Substance Use Topics   Alcohol use: Yes    Comment: 2 glasses wine/day prior to bowel surgery; since bowel surger 05/2019 - only 2 glasses since   Marital Status: Married  ROS  Review of Systems  Constitutional: Positive for malaise/fatigue (daytime).  Cardiovascular:  Negative for chest pain, dyspnea on exertion and leg swelling.  Respiratory:  Positive for snoring.   Gastrointestinal:  Negative for melena.  Neurological:  Positive for disturbances in  coordination, dizziness, light-headedness and tremors.  Objective  Blood pressure (!) 141/79, pulse 66, temperature 98.3 F (36.8 C), temperature source Temporal, resp. rate 16, height 5' 9"  (1.753 m), weight 166 lb 12.8 oz (75.7 kg), SpO2 98 %. Body mass index is 24.63 kg/m.  Vitals with BMI 08/16/2020 08/06/2020 02/21/2020  Height 5' 9"  5' 9"  5' 8.5"  Weight 166 lbs 13 oz 166 lbs 172 lbs  BMI 24.62 44.0 10.27  Systolic 253 664 403  Diastolic 79 82 76  Pulse 66 66 61    Orthostatic VS for the past 72 hrs (Last 3 readings):  Orthostatic BP Patient Position BP Location Cuff Size  08/16/20 1342 110/72 Standing Left Arm --  08/16/20 1341 132/78 -- -- --  08/16/20 1340 138/80 Supine Left Arm --  08/16/20 1240 -- Sitting Left Arm Normal     Physical Exam Neck:     Vascular: Carotid bruit (Bilateral  soft) present. No JVD.  Cardiovascular:     Rate and Rhythm: Normal rate and regular rhythm.     Pulses: Intact distal pulses.     Heart sounds: Murmur heard.  Blowing holosystolic murmur is present with a grade of 2/6 at the apex.  Crescendo midsystolic murmur of grade 2/6 is also present.    No gallop.  Pulmonary:     Effort: Pulmonary effort is  normal.     Breath sounds: Normal breath sounds.  Abdominal:     General: Bowel sounds are normal.     Palpations: Abdomen is soft.  Musculoskeletal:        General: No swelling.     Laboratory examination:   External labs:   Labs 08/20/2020:  Hb 13.8/HCT 40.8, platelets 160, normal indicis.  Serum glucose 140 mg, BUN 20, creatinine 1.24, EGFR 60 [place], potassium 4.6.  TSH normal at 0.92.  Total cholesterol 186, triglycerides 73, HDL 105, LDL 68.  Medications and allergies   Allergies  Allergen Reactions   Escitalopram     Other reaction(s): felt weird and tired   Lisinopril Cough      Medication prior to this encounter:   Outpatient Medications Prior to Visit  Medication Sig Dispense Refill   carbidopa-levodopa  (SINEMET IR) 25-100 MG tablet Take 1 tablet by mouth 3 (three) times daily. 270 tablet 1   indomethacin (INDOCIN) 50 MG capsule 3 (three) times daily as needed.      rosuvastatin (CRESTOR) 20 MG tablet Take 20 mg by mouth daily.   4   valsartan (DIOVAN) 320 MG tablet 1 tablet     vitamin B-12 (CYANOCOBALAMIN) 500 MCG tablet Take 500 mcg by mouth daily.     amLODipine (NORVASC) 5 MG tablet Take 1 tablet by mouth daily.     Multiple Vitamin (MULTIVITAMIN) tablet Take 1 tablet by mouth daily. (Patient not taking: Reported on 08/16/2020)     escitalopram (LEXAPRO) 10 MG tablet Take 1 tablet (10 mg total) by mouth daily. 90 tablet 1   No facility-administered medications prior to visit.     FINAL MEDICATION AS OF TODAY:   Medications after current encounter Current Outpatient Medications  Medication Instructions   amLODipine (NORVASC) 2.5 mg, Oral, Daily   carbidopa-levodopa (SINEMET IR) 25-100 MG tablet 1 tablet, Oral, 3 times daily   indomethacin (INDOCIN) 50 MG capsule 3 times daily PRN   Multiple Vitamin (MULTIVITAMIN) tablet 1 tablet, Daily   rosuvastatin (CRESTOR) 20 mg, Oral, Daily   valsartan (DIOVAN) 320 MG tablet 1 tablet   vitamin B-12 (CYANOCOBALAMIN) 500 mcg, Oral, Daily    Radiology:   No results found.  Cardiac Studies:    EKG:   EKG 08/16/2020: Normal sinus rhythm at rate of 62 bpm, normal axis, no evidence of ischemia, normal EKG   Assessment     ICD-10-CM   1. Dizziness and giddiness  R42 PCV CARDIAC STRESS TEST    2. Daytime somnolence  R40.0 EKG 12-Lead    Ambulatory referral to Sleep Studies    PCV CARDIAC STRESS TEST    3. Loud snoring  R06.83 Ambulatory referral to Sleep Studies    4. Bilateral carotid bruits  R09.89 PCV CAROTID DUPLEX (BILATERAL)    5. Murmur  R01.1 PCV ECHOCARDIOGRAM COMPLETE    6. Orthostatic hypotension  I95.1     7. Primary hypertension  I10 amLODipine (NORVASC) 5 MG tablet    8. Hypercholesteremia  E78.00         Medications Discontinued During This Encounter  Medication Reason   escitalopram (LEXAPRO) 10 MG tablet Error   amLODipine (NORVASC) 5 MG tablet Dose change    Meds ordered this encounter  Medications   amLODipine (NORVASC) 5 MG tablet    Sig: Take 0.5 tablets (2.5 mg total) by mouth daily.    Dispense:  15 tablet    Refill:  2    Orders Placed This Encounter  Procedures   Ambulatory referral to Sleep Studies    Referral Priority:   Routine    Referral Type:   Consultation    Referral Reason:   Specialty Services Required    Referred to Provider:   Marius Ditch, MD    Number of Visits Requested:   1   PCV CARDIAC STRESS TEST    Standing Status:   Future    Standing Expiration Date:   10/16/2020   EKG 12-Lead   PCV ECHOCARDIOGRAM COMPLETE    Standing Status:   Future    Standing Expiration Date:   08/16/2021   Recommendations:   KALYB PEMBLE is a 70 y.o. Fairly active Caucasian male with hypertension, Parkinson's disease which is well controlled, with an extensive evaluation for dizziness, including neurology and ENT felt to be unyielding.  Now referred for cardiac evaluation.  Patient tries to remain fairly active, [facility] minutes without any chest pain or dyspnea.  His main complaint is dizziness when he suddenly stands up and gait instability. No syncope or near syncope.  He has mild orthostatic hypotension.  For now I will use the dose of amlodipine from 5 mg to 2.5 mg in the evening.  Advised him to support stockings for now.    Schedule for carotid duplex for bruit/follow-up surveillance of carotid stenosis.   He also complains of marked daytime somnolence. Schedule for routine treadmill stress test. Will schedule for an echocardiogram for evaluation of heart murmur.   He is mildly orthostatic and drops BP by 20-25 points.   For daytime somnolence, of underlying Parkinson's disease, central sleep apnea and also component of  obstructive sleep apnea in view of loud snoring need to be excluded.  We will refer him to be evaluated by Dr. Nehemiah Settle.  I will see him back in 2 months for follow-up and hopefully would have completed his sleep evaluation as well at that time.  This was a 60-minute office visit encounter with evaluation of external records, external labs including lipids and coordination of care.    Adrian Prows, MD, [facility] 08/16/2020, 2:00 PM Office: [phone]

## 2020-08-19 ENCOUNTER — Other Ambulatory Visit: Payer: Self-pay | Admitting: Neurology

## 2020-08-23 ENCOUNTER — Ambulatory Visit: Payer: Medicare Other | Attending: Neurology | Admitting: Physical Therapy

## 2020-08-23 ENCOUNTER — Other Ambulatory Visit: Payer: Self-pay

## 2020-08-23 ENCOUNTER — Encounter: Payer: Self-pay | Admitting: Physical Therapy

## 2020-08-23 ENCOUNTER — Telehealth: Payer: Self-pay | Admitting: Physical Therapy

## 2020-08-23 DIAGNOSIS — R293 Abnormal posture: Secondary | ICD-10-CM | POA: Diagnosis not present

## 2020-08-23 DIAGNOSIS — G20 Parkinson's disease: Secondary | ICD-10-CM

## 2020-08-23 DIAGNOSIS — R2681 Unsteadiness on feet: Secondary | ICD-10-CM | POA: Insufficient documentation

## 2020-08-23 DIAGNOSIS — R2689 Other abnormalities of gait and mobility: Secondary | ICD-10-CM | POA: Insufficient documentation

## 2020-08-23 NOTE — Telephone Encounter (Signed)
Dr. Carles Collet, Arville Go was evaluated by Physical Therapy on 08/23/20.  The patient would benefit from an OT evaluation for decr LUE strength and difficulties with fine motor tasks due to PD.  If you agree, please place an order in [facility] workque in [facility] or fax the order to [phone]. Thank you, Janann August, PT, DPT 08/23/20 10:44 AM    [facility] [address] [place], [place]  [address] Phone:  [phone] Fax:  [phone]

## 2020-08-23 NOTE — Therapy (Signed)
[facility] [address] [place], [place], [address] Phone: [phone]   Fax:  [phone]  Physical Therapy Evaluation  Patient Details  Name: Cody Dickerson MRN: [id number] Date of Birth: November 06, 1950 Referring Provider (PT): Dr. Carles Collet   Encounter Date: 08/23/2020   PT End of Session - 08/23/20 0941     Visit Number 1    Number of Visits 13    Date for PT Re-Evaluation 10/22/20    Authorization Type Medicare    PT Start Time 0848    PT Stop Time 0933    PT Time Calculation (min) 45 min    Equipment Utilized During Treatment Gait belt    Activity Tolerance Patient tolerated treatment well    Behavior During Therapy [facility] for tasks assessed/performed             Past Medical History:  Diagnosis Date   Balance problem    Gout    Hypercholesterolemia    Hypertension    Small bowel obstruction [facility])     Past Surgical History:  Procedure Laterality Date   BOWEL BLOCKAGE  05/2019    There were no vitals filed for this visit.    Subjective Assessment - 08/23/20 0851     Subjective Diagnosed with PD back in December of 2021. Seen for dizziness/vestibular therapy back in fall of 2021 at this location. Still reporting dizziness, reports more of imbalance, which he reports has been the case for a while. Bending over makes him feel more unsteady. Does the PD cycle class at the [facility] on Friday and walking when the balance feels better, does not use any AD to help him walk. Notices when he stays in one position too long and then moves his back feels a little stiff. Notices it is harder to do things with his L hand such as shampooing his hair, "not as nimble".    Pertinent History PD (diagnosed december 2021), Lt distal ICA aneurysm, HTN, chronic dizziness and imbalance    Limitations Walking    Diagnostic tests --    Patient Stated Goals wants to get his balance back    Currently in Pain? No/denies                 [facility] PT Assessment - 08/23/20 0857       Assessment   Medical Diagnosis PD    Referring Provider (PT) Dr. Carles Collet    Onset Date/Surgical Date 08/06/20   date of referral, diganosed with PD in 02/2020   Hand Dominance Right    Prior Therapy PT at this location for dizziness/balance in fall of 2021      Precautions   Precautions Fall      Balance Screen   Has the patient fallen in the past 6 months Yes   tripped over a step up in an uneven area   How many times? 1    Has the patient had a decrease in activity level because of a fear of falling?  No    Is the patient reluctant to leave their home because of a fear of falling?  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, clinical, still representing a few companies    Leisure used to play golf, going to hoppers games      Sensation   Light Touch Appears Intact      Coordination   Gross Motor Movements are Fluid and Coordinated Yes      Posture/Postural Control   Posture/Postural Control Postural limitations    Postural Limitations Forward head;Rounded Shoulders      ROM / Strength   AROM / PROM / Strength Strength      Strength   Strength Assessment Site Hip;Knee;Ankle    Right/Left Hip Right;Left    Right Hip Flexion 5/5    Left Hip Flexion 5/5    Right/Left Knee Left;Right    Right Knee Flexion 5/5    Right Knee Extension 5/5    Left Knee Flexion 5/5    Left Knee Extension 5/5    Right/Left Ankle Left;Right    Right Ankle Dorsiflexion 5/5    Left Ankle Dorsiflexion 5/5      Transfers   Transfers Sit to Stand     Sit to Stand 5: Supervision    Five time sit to stand comments  13.56 seconds, no UE support from standard height chair    Stand to Sit 5: Supervision      Ambulation/Gait   Ambulation/Gait Yes    Ambulation/Gait Assistance 5: Supervision    Assistive device None    Gait Pattern Step-through pattern;Decreased arm swing - right;Decreased arm swing - left;Trunk flexed    Ambulation Surface Level;Indoor    Gait velocity 8.47 seconds = 3.87 ft/sec      Standardized Balance Assessment   Standardized Balance Assessment Timed Up and Go Test;Mini-BESTest      Mini-BESTest   Sit To Stand Normal: Comes to stand without use of hands and stabilizes independently.    Rise to Toes Normal: Stable for 3 s with maximum height.    Stand on one leg (left) Moderate: < 20 s   4.69, 1.62   Stand on one leg (right) Moderate: < 20 s   9.4, 2.69   Stand on one leg - lowest score 1    Compensatory Stepping Correction - Forward Moderate: More than one step is required to recover equilibrium   2 smaller steps   Compensatory Stepping Correction - Backward Moderate: More than one step is required to recover equilibrium   2 smaller steps   Compensatory Stepping Correction - Left Lateral Moderate: Several steps to recover equilibrium    Compensatory Stepping Correction - Right Lateral Moderate: Several steps to recover equilibrium    Stepping Corredtion Lateral - lowest score 1    Stance - Feet together, eyes open, firm surface  Normal: 30s    Stance - Feet together, eyes closed, foam surface  Normal: 30s   mild/mod postural sway   Incline - Eyes Closed Normal: Stands independently 30s and aligns with gravity    Change in Gait Speed Normal: Significantly changes walkling speed without imbalance    Walk with head turns - Horizontal Moderate: performs head turns with reduction in gait speed.    Walk with pivot turns Normal: Turns with feet close FAST (< 3 steps) with good balance.    Step over obstacles Normal:  Able to step over box with minimal change  of gait speed and with good balance.    Timed UP & GO with Dual Task Moderate: Dual Task affects either counting OR walking (>10%) when compared to the TUG without Dual Task.    Mini-BEST total score 22      Timed Up and Go Test   Normal TUG (seconds) 9    Cognitive TUG (seconds) 12    TUG Comments Scores >10% difference indicate increased fall risk/difficulty with dual tasking                        Objective measurements completed on examination: See above findings.               PT Education - 08/23/20 0941     Education Details clinical findings, POC, OT eval - PT to send referral request from Dr. Carles Collet due to difficulty with LUE fine motor tasks and weakness    Person(s) Educated Patient    Methods Explanation    Comprehension Verbalized understanding              PT Short Term Goals - 08/23/20 0943       PT SHORT TERM GOAL #1   Title Pt will be independent with initial HEP in order to build upon functional gains made in therapy. ALL STGS DUE 09/20/20    Time 4    Period Weeks    Status New    Target Date 09/20/20      PT SHORT TERM GOAL #2   Title Pt will decr 5x sit <> stand time without UE support to 12 seconds or less to demo improved transfer efficiency.    Baseline 13.56 seconds    Time 4    Period Weeks    Status New      PT SHORT TERM GOAL #3   Title Pt will verbalize understanding of local Parkinson's disease resources, including options for continued community fitness.    Time 4    Period Weeks    Status New               PT Long Term Goals - 08/23/20 0947       PT LONG TERM GOAL #1   Title Pt will be independent with PD specific HEP in order to build upon functional gains made in therapy. ALL LTGS DUE 10/18/20    Time 8    Period Weeks    Status New    Target Date 10/18/20      PT LONG TERM GOAL #2   Title Pt will recover posterior and anterior balance in push and release  test in 1 robust step independently, for improved balance recovery    Baseline 2 smaller steps    Time 8    Period Weeks    Status New      PT LONG TERM GOAL #3   Title Pt will improve miniBEST score to at least a 25/28 in order to demo decr fall risk.    Baseline 22/28    Time 8    Period Weeks    Status New      PT LONG TERM GOAL #4   Title Pt will improve TUG and TUG cog score to less than or equal to 10% difference for improved dual task/decreased fall risk.    Baseline 9 seconds TUG, 12 seconds cog TUG    Time 8    Period Weeks    Status New  Plan - 08/23/20 0950     Clinical Impression Statement Patient is a 70 year old male referred to Neuro OPPT for PD - pt recently diagnosed in December of 2021. Pt recently seen at this clinic last fall for dizziness/balance.   Pt's PMH is significant for: PD, h/o small bowel obstruction, HTN, Lt distal ICA aneurysm. The following deficits were present during the exam: impaired balance, gait abnormalities, postural abnormalities, decr timing/coordination of gait, decr functional strength for transfers. Based on cog TUG and miniBEST pt is an incr risk for falls. Pt would benefit from skilled PT to address these impairments and functional limitations to maximize functional mobility independence and decr fall risk.    Personal Factors and Comorbidities Comorbidity 2;Time since onset of injury/illness/exacerbation;Past/Current Experience    Comorbidities PD, h/o small bowel obstruction, HTN, Lt distal ICA aneurysm    Examination-Activity Limitations Locomotion Level;Bend;Squat;Transfers;Reach Overhead    Examination-Participation Restrictions Yard Work;Meal Prep;Shop;Community Activity;Interpersonal Relationship;Other   playing golf   Stability/Clinical Decision Making Stable/Uncomplicated    Clinical Decision Making Low    Rehab Potential Good    PT Frequency 2x / week   12 visits over 6 weeks   PT Duration 8 weeks    12 visits over 6 weeks   PT Treatment/Interventions ADLs/Self Care Home Management;Balance training;Vestibular;Gait training;Therapeutic exercise;Therapeutic activities;Neuromuscular re-education;Patient/family education;DME Instruction;Stair training;Functional mobility training    PT Next Visit Plan initial HEP - seated and standing PWR moves, stepping strategies.working on B arm swing with gait. postural activities.  balance on compliant surfaces with vision removed.    Recommended Other Services OT eval - PT to send referral request to Dr. Arnold Long and Agree with Plan of Care Patient             Patient will benefit from skilled therapeutic intervention in order to improve the following deficits and impairments:  Abnormal gait, Dizziness, Decreased balance, Postural dysfunction, Decreased strength, Difficulty walking  Visit Diagnosis: Other abnormalities of gait and mobility  Abnormal posture  Unsteadiness on feet     Problem List Patient Active Problem List   Diagnosis Date Noted   SBO (small bowel obstruction) ([place]) 07/06/2019    Arliss Journey, PT, DPT  08/23/2020, 10:06 AM  [facility] [address] [place], [place], [address] Phone: [phone]   Fax:  [phone]  Name: Cody Dickerson MRN: [id number] Date of Birth: 12-01-1950

## 2020-08-27 ENCOUNTER — Other Ambulatory Visit: Payer: Self-pay

## 2020-08-27 DIAGNOSIS — I10 Essential (primary) hypertension: Secondary | ICD-10-CM

## 2020-08-27 MED ORDER — AMLODIPINE BESYLATE 2.5 MG PO TABS: 2.5000 mg | ORAL_TABLET | Freq: Every day | ORAL | 3 refills | Status: DC

## 2020-09-11 ENCOUNTER — Other Ambulatory Visit: Payer: Self-pay

## 2020-09-11 ENCOUNTER — Ambulatory Visit: Payer: Medicare Other | Attending: Neurology | Admitting: Physical Therapy

## 2020-09-11 ENCOUNTER — Encounter: Payer: Self-pay | Admitting: Physical Therapy

## 2020-09-11 DIAGNOSIS — R29818 Other symptoms and signs involving the nervous system: Secondary | ICD-10-CM | POA: Insufficient documentation

## 2020-09-11 DIAGNOSIS — R2681 Unsteadiness on feet: Secondary | ICD-10-CM | POA: Diagnosis not present

## 2020-09-11 DIAGNOSIS — R293 Abnormal posture: Secondary | ICD-10-CM | POA: Insufficient documentation

## 2020-09-11 DIAGNOSIS — R41840 Attention and concentration deficit: Secondary | ICD-10-CM | POA: Diagnosis not present

## 2020-09-11 DIAGNOSIS — R2689 Other abnormalities of gait and mobility: Secondary | ICD-10-CM | POA: Insufficient documentation

## 2020-09-11 DIAGNOSIS — R29898 Other symptoms and signs involving the musculoskeletal system: Secondary | ICD-10-CM | POA: Diagnosis not present

## 2020-09-11 DIAGNOSIS — R251 Tremor, unspecified: Secondary | ICD-10-CM | POA: Diagnosis not present

## 2020-09-11 DIAGNOSIS — R278 Other lack of coordination: Secondary | ICD-10-CM | POA: Insufficient documentation

## 2020-09-11 NOTE — Therapy (Signed)
[place] [address] [place], [place], [address] Phone: [phone]   Fax:  [phone]  Physical Therapy Treatment  Patient Details  Name: Cody Dickerson MRN: [id number] Date of Birth: 28-Apr-1950 Referring Provider (PT): Dr. Carles Collet   Encounter Date: 09/11/2020   PT End of Session - 09/11/20 1111     Visit Number 2    Number of Visits 13    Date for PT Re-Evaluation 10/22/20    Authorization Type Medicare    PT Start Time 1108    PT Stop Time 1146    PT Time Calculation (min) 38 min    Equipment Utilized During Treatment --    Activity Tolerance Patient tolerated treatment well    Behavior During Therapy [facility] for tasks assessed/performed             Past Medical History:  Diagnosis Date   Balance problem    Gout    Hypercholesterolemia    Hypertension    Small bowel obstruction [facility])     Past Surgical History:  Procedure Laterality Date   BOWEL BLOCKAGE  05/2019    There were no vitals filed for this visit.   Subjective Assessment - 09/11/20 1110     Subjective My balance seems to be my biggest problem.  Balance difficulty happens with initiating first few steps, and also with head turns.    Pertinent History PD (diagnosed december 2021), Lt distal ICA aneurysm, HTN, chronic dizziness and imbalance    Limitations Walking    Patient Stated Goals wants to get his balance back    Currently in Pain? No/denies                               [facility] Adult PT Treatment/Exercise - 09/11/20 0001       Ambulation/Gait   Ambulation/Gait Yes    Ambulation/Gait Assistance 5: Supervision    Ambulation Distance (Feet) 200 Feet   345   Assistive device None;Other (Comment)   walking poles to facilitate arm swing   Gait Pattern Step-through pattern;Decreased arm swing - right;Decreased arm swing - left;Trunk flexed    Ambulation Surface Level;Indoor    Gait Comments Used bilateral walking  poles x 115 ft to facilitate increased arm swing, with also cues for looking ahead for improved posture and increased step length.                 Balance Exercises - 09/11/20 0001       Balance Exercises: Standing   SLS with Vectors Solid surface;Other reps (comment);Limitations    SLS with Vectors Limitations alt step taps to 6" step x 10, to 12" step x 10, then 6/12/6" step x 10 reps no UE support    Stepping Strategy Anterior;Posterior;Lateral;UE support;10 reps;Limitations    Stepping Strategy Limitations Pt reports L foot more off balance with step strategy work    Step Ups Forward;6 inch;UE support 1;Limitations    Step Ups Limitations step up/up, down/down x 10    Heel Raises Both;10 reps    Toe Raise Both;10 reps    Other Standing Exercises Wide BOS lateral rocking/weigthshifting x 10, stagger stance weightshifting forward/back x 10 reps    Other Standing Exercises Comments Hip strategy work with anterior/posterior wegithshift through hips wide BOS at counter,  x 10 reps.            PWR! Moves in [place] Up  for posture, x 10 reps, 2 sets.  Cues for posture, scapular retraction, elbow extension, hands opening PWR! Rock for weigthshifting x 10 reps through hips only, then 8 reps each side with added reach and head turn to look at hand.  Cues for controlled pace; pt somewhat unbalanced with quick reach/head turn. PWR! Twist for trunk rotation, x 5 reps, 2 sets.  Cues to use foot to pivot and head to turn to look at hands clapping. PWR! Step for step initiation, x 10 reps.  Cues for coordination of arms reaching and step height out and in.  Visual, verbal, tactile cues for technique     PT Short Term Goals - 08/23/20 0943       PT SHORT TERM GOAL #1   Title Pt will be independent with initial HEP in order to build upon functional gains made in therapy. ALL STGS DUE 09/20/20    Time 4    Period Weeks    Status New    Target Date 09/20/20      PT SHORT  TERM GOAL #2   Title Pt will decr 5x sit <> stand time without UE support to 12 seconds or less to demo improved transfer efficiency.    Baseline 13.56 seconds    Time 4    Period Weeks    Status New      PT SHORT TERM GOAL #3   Title Pt will verbalize understanding of local Parkinson's disease resources, including options for continued community fitness.    Time 4    Period Weeks    Status New               PT Long Term Goals - 08/23/20 0947       PT LONG TERM GOAL #1   Title Pt will be independent with PD specific HEP in order to build upon functional gains made in therapy. ALL LTGS DUE 10/18/20    Time 8    Period Weeks    Status New    Target Date 10/18/20      PT LONG TERM GOAL #2   Title Pt will recover posterior and anterior balance in push and release test in 1 robust step independently, for improved balance recovery    Baseline 2 smaller steps    Time 8    Period Weeks    Status New      PT LONG TERM GOAL #3   Title Pt will improve miniBEST score to at least a 25/28 in order to demo decr fall risk.    Baseline 22/28    Time 8    Period Weeks    Status New      PT LONG TERM GOAL #4   Title Pt will improve TUG and TUG cog score to less than or equal to 10% difference for improved dual task/decreased fall risk.    Baseline 9 seconds TUG, 12 seconds cog TUG    Time 8    Period Weeks    Status New                   Plan - 09/11/20 1204     Clinical Impression Statement Skilled PT session today focused on standing weigthshifting and step strategies as well as standing PWR! Moves.  With PWR! Moves, pt requires verbal, visual, occasional tactile cues for technique and control.  He improves with repetition of exercise, but he should do well with adding to HEP after additional practice.  Pt has not been seen for PT since eval 08/23/2020 (unsure if it is due to scheduling conflicts), but pt does well as he returns today.    Personal Factors and  Comorbidities Comorbidity 2;Time since onset of injury/illness/exacerbation;Past/Current Experience    Comorbidities PD, h/o small bowel obstruction, HTN, Lt distal ICA aneurysm    Examination-Activity Limitations Locomotion Level;Bend;Squat;Transfers;Reach Overhead    Examination-Participation Restrictions Yard Work;Meal Prep;Shop;Community Activity;Interpersonal Relationship;Other   playing golf   Stability/Clinical Decision Making Stable/Uncomplicated    Rehab Potential Good    PT Frequency 2x / week   12 visits over 6 weeks   PT Duration 8 weeks   12 visits over 6 weeks   PT Treatment/Interventions ADLs/Self Care Home Management;Balance training;Vestibular;Gait training;Therapeutic exercise;Therapeutic activities;Neuromuscular re-education;Patient/family education;DME Instruction;Stair training;Functional mobility training    PT Next Visit Plan initial HEP - work on standing PWR moves again to initiate for HEP, stepping strategies.working on B arm swing with gait. postural activities.  balance on compliant surfaces with vision removed.    Consulted and Agree with Plan of Care Patient             Patient will benefit from skilled therapeutic intervention in order to improve the following deficits and impairments:  Abnormal gait, Dizziness, Decreased balance, Postural dysfunction, Decreased strength, Difficulty walking  Visit Diagnosis: Unsteadiness on feet  Abnormal posture  Other abnormalities of gait and mobility     Problem List Patient Active Problem List   Diagnosis Date Noted   SBO (small bowel obstruction) ([place]) 07/06/2019    Cody Dickerson W. 09/11/2020, 12:08 PM Frazier Butt., PT   [place] [address] [place], [place], [address] Phone: [phone]   Fax:  [phone]  Name: Cody Dickerson MRN: [id number] Date of Birth: Mar 29, 1950

## 2020-09-13 ENCOUNTER — Ambulatory Visit: Payer: Medicare Other | Admitting: Physical Therapy

## 2020-09-13 ENCOUNTER — Other Ambulatory Visit: Payer: Self-pay

## 2020-09-13 DIAGNOSIS — R29818 Other symptoms and signs involving the nervous system: Secondary | ICD-10-CM | POA: Diagnosis not present

## 2020-09-13 DIAGNOSIS — R293 Abnormal posture: Secondary | ICD-10-CM

## 2020-09-13 DIAGNOSIS — R2681 Unsteadiness on feet: Secondary | ICD-10-CM | POA: Diagnosis not present

## 2020-09-13 DIAGNOSIS — R2689 Other abnormalities of gait and mobility: Secondary | ICD-10-CM

## 2020-09-13 DIAGNOSIS — R278 Other lack of coordination: Secondary | ICD-10-CM | POA: Diagnosis not present

## 2020-09-13 DIAGNOSIS — R29898 Other symptoms and signs involving the musculoskeletal system: Secondary | ICD-10-CM | POA: Diagnosis not present

## 2020-09-13 NOTE — Therapy (Addendum)
[place] [address] [place], [place], [address] Phone: [phone]   Fax:  [phone]  Physical Therapy Treatment  Patient Details  Name: Cody Dickerson MRN: [id number] Date of Birth: 1950-06-06 Referring Provider (PT): Dr. Carles Collet   Encounter Date: 09/13/2020   PT End of Session - 09/13/20 1017     Visit Number 3    Number of Visits 13    Date for PT Re-Evaluation 10/22/20    Authorization Type Medicare    PT Start Time 1020    PT Stop Time 1100    PT Time Calculation (min) 40 min    Activity Tolerance Patient tolerated treatment well    Behavior During Therapy [facility] for tasks assessed/performed             Past Medical History:  Diagnosis Date   Balance problem    Gout    Hypercholesterolemia    Hypertension    Small bowel obstruction [facility])     Past Surgical History:  Procedure Laterality Date   BOWEL BLOCKAGE  05/2019    There were no vitals filed for this visit.   Subjective Assessment - 09/13/20 1017     Subjective No changes, no pain.  No falls.    Pertinent History PD (diagnosed december 2021), Lt distal ICA aneurysm, HTN, chronic dizziness and imbalance    Limitations Walking    Patient Stated Goals wants to get his balance back    Currently in Pain? No/denies                               [facility] Adult PT Treatment/Exercise - 09/13/20 0001       Ambulation/Gait   Ambulation/Gait Yes    Ambulation/Gait Assistance 5: Supervision    Ambulation Distance (Feet) 460 Feet   additional 300 ft, cues for use of visual target, looking ahead, cues for increased step length.   Assistive device None    Gait Pattern Step-through pattern;Decreased arm swing - right;Decreased arm swing - left;Trunk flexed    Ambulation Surface Level;Indoor    Gait Comments [place].  Cued patient to look ahead 10-20 ft at ground, just not down at feet,  to help with improved posture with gait.                 Balance Exercises - 09/13/20 0001       Balance Exercises: Standing   Standing Eyes Opened Wide (BOA);Narrow base of support (BOS);Head turns;Foam/compliant surface;5 reps;Limitations    Standing Eyes Opened Limitations Head turns x 5, head nods x 5, cues for full eye motion to each side    Standing Eyes Closed Wide (BOA);Narrow base of support (BOS);Foam/compliant surface;1 rep;10 secs    Wall Bumps Hip;10 reps    Stepping Strategy Anterior;Posterior;Lateral;UE support;10 reps;Limitations;Foam/compliant surface    Stepping Strategy Limitations Cues for increased foot clearance.  Forward>back step and weightshift 2 sets x 10 reps, cues for increased step length, cues to look ahead, not down at ground, awareness of effort level required for full foot clearance.    Heel Raises Both;10 reps    Toe Raise Both;10 reps    Other Standing Exercises Comments Standing on [employer], in corner, trunk rotation reaching across to touch wall, cues to look across to each side, 10 reps.            Seated posture exercises, cues to  stay "as tall as the wall", 3 reps x 15 seconds each.  Neck retraction at wall with towel, then towel removed. X 5 reps PWR! Moves in [place]! Up for posture, x 20 reps.  Cues for posture, scapular retraction, elbow extension, hands opening PWR! Rock for weigthshifting x 20 reps each side with added reach and head turn to look at hand.  Cues for controlled pace; pt somewhat unbalanced with quick reach/head turn. PWR! Twist for trunk rotation, x 10 reps, 2 sets.  Cues to use foot to pivot and head to turn to look at hands clapping. PWR! Step for step initiation, x 10 reps, 2 sets.  Cues for coordination of arms reaching and step height out and in.     Visual, verbal, tactile cues for technique    PT Short Term Goals - 08/23/20 0943       PT SHORT TERM GOAL #1   Title Pt will be independent with initial  HEP in order to build upon functional gains made in therapy. ALL STGS DUE 09/20/20    Time 4    Period Weeks    Status New    Target Date 09/20/20      PT SHORT TERM GOAL #2   Title Pt will decr 5x sit <> stand time without UE support to 12 seconds or less to demo improved transfer efficiency.    Baseline 13.56 seconds    Time 4    Period Weeks    Status New      PT SHORT TERM GOAL #3   Title Pt will verbalize understanding of local Parkinson's disease resources, including options for continued community fitness.    Time 4    Period Weeks    Status New               PT Long Term Goals - 08/23/20 0947       PT LONG TERM GOAL #1   Title Pt will be independent with PD specific HEP in order to build upon functional gains made in therapy. ALL LTGS DUE 10/18/20    Time 8    Period Weeks    Status New    Target Date 10/18/20      PT LONG TERM GOAL #2   Title Pt will recover posterior and anterior balance in push and release test in 1 robust step independently, for improved balance recovery    Baseline 2 smaller steps    Time 8    Period Weeks    Status New      PT LONG TERM GOAL #3   Title Pt will improve miniBEST score to at least a 25/28 in order to demo decr fall risk.    Baseline 22/28    Time 8    Period Weeks    Status New      PT LONG TERM GOAL #4   Title Pt will improve TUG and TUG cog score to less than or equal to 10% difference for improved dual task/decreased fall risk.    Baseline 9 seconds TUG, 12 seconds cog TUG    Time 8    Period Weeks    Status New                   Plan - 09/13/20 1157     Clinical Impression Statement Continued to work on standing PWR! Moves, and pt is improving on them, but doesn't feel he is ready to have them for  home.  Worked on standing corner balance exercises, with pt having difficulty with full visual tracking with head movmeents.  He responds well to cues for upright posture with gait.  He will continue to  benefit from skilled PT to fruther address balance, posture, gait.    Personal Factors and Comorbidities Comorbidity 2;Time since onset of injury/illness/exacerbation;Past/Current Experience    Comorbidities PD, h/o small bowel obstruction, HTN, Lt distal ICA aneurysm    Examination-Activity Limitations Locomotion Level;Bend;Squat;Transfers;Reach Overhead    Examination-Participation Restrictions Yard Work;Meal Prep;Shop;Community Activity;Interpersonal Relationship;Other   playing golf   Stability/Clinical Decision Making Stable/Uncomplicated    Rehab Potential Good    PT Frequency 2x / week   12 visits over 6 weeks   PT Duration 8 weeks   12 visits over 6 weeks   PT Treatment/Interventions ADLs/Self Care Home Management;Balance training;Vestibular;Gait training;Therapeutic exercise;Therapeutic activities;Neuromuscular re-education;Patient/family education;DME Instruction;Stair training;Functional mobility training    PT Next Visit Plan initial HEP - work on standing PWR moves again to initiate for HEP, stepping strategies.working on B arm swing with gait. postural activities.  balance on compliant surfaces with vision removed.    Consulted and Agree with Plan of Care Patient             Patient will benefit from skilled therapeutic intervention in order to improve the following deficits and impairments:  Abnormal gait, Dizziness, Decreased balance, Postural dysfunction, Decreased strength, Difficulty walking  Visit Diagnosis: Unsteadiness on feet  Abnormal posture  Other abnormalities of gait and mobility     Problem List Patient Active Problem List   Diagnosis Date Noted   SBO (small bowel obstruction) ([place]) 07/06/2019    Kimmberly Wisser W. 09/13/2020, 12:00 PM Frazier Butt., PT  [place] [address] [place], [place], [address] Phone: [phone]   Fax:  [phone]  Name: Cody Dickerson MRN:  [id number] Date of Birth: 1950/11/25

## 2020-09-18 ENCOUNTER — Ambulatory Visit: Payer: Medicare Other | Admitting: Occupational Therapy

## 2020-09-18 ENCOUNTER — Encounter: Payer: Self-pay | Admitting: Physical Therapy

## 2020-09-18 ENCOUNTER — Ambulatory Visit: Payer: Medicare Other | Admitting: Physical Therapy

## 2020-09-18 ENCOUNTER — Encounter: Payer: Self-pay | Admitting: Occupational Therapy

## 2020-09-18 ENCOUNTER — Other Ambulatory Visit: Payer: Self-pay

## 2020-09-18 DIAGNOSIS — R278 Other lack of coordination: Secondary | ICD-10-CM | POA: Diagnosis not present

## 2020-09-18 DIAGNOSIS — R2689 Other abnormalities of gait and mobility: Secondary | ICD-10-CM

## 2020-09-18 DIAGNOSIS — R293 Abnormal posture: Secondary | ICD-10-CM

## 2020-09-18 DIAGNOSIS — R2681 Unsteadiness on feet: Secondary | ICD-10-CM

## 2020-09-18 DIAGNOSIS — R29898 Other symptoms and signs involving the musculoskeletal system: Secondary | ICD-10-CM | POA: Diagnosis not present

## 2020-09-18 DIAGNOSIS — R29818 Other symptoms and signs involving the nervous system: Secondary | ICD-10-CM

## 2020-09-18 DIAGNOSIS — R251 Tremor, unspecified: Secondary | ICD-10-CM

## 2020-09-18 DIAGNOSIS — R41840 Attention and concentration deficit: Secondary | ICD-10-CM

## 2020-09-18 NOTE — Therapy (Signed)
[facility] [address] [place], [place], [address] Phone: [phone]   Fax:  [phone]  Occupational Therapy Evaluation  Patient Details  Name: BEECHER FURIO MRN: [id number] Date of Birth: [birth year] Referring Provider (OT): Dr. Wells Guiles Tat   Encounter Date: 09/18/2020   OT End of Session - 09/18/20 1436     Visit Number 1    Number of Visits 17    Date for OT Re-Evaluation 11/30/20   will see for 6-8 wks starting wk of 10/08/20 due to schedule conflicts   Authorization Type Medicare & AARP, covered at 100%    Authorization - Visit Number 1    Authorization - Number of Visits 10    Progress Note Due on Visit 10    OT Start Time 1104    OT Stop Time 1149    OT Time Calculation (min) 45 min    Activity Tolerance Patient tolerated treatment well    Behavior During Therapy [facility] for tasks assessed/performed             Past Medical History:  Diagnosis Date   Balance problem    Gout    Hypercholesterolemia    Hypertension    Small bowel obstruction [facility])     Past Surgical History:  Procedure Laterality Date   BOWEL BLOCKAGE  05/2019    There were no vitals filed for this visit.   Subjective Assessment - 09/18/20 1110     Subjective  pt reports noticing that brushing teeth and buttoning isn't as easy as it used to be.    Pertinent History Parkinson's Disease.  PMH:  Hypercholesterolemia, HTN, hx of gout, hx small bowel blockage.    Patient Stated Goals improve writing, improve ADLs    Currently in Pain? No/denies   pt reports occasional pain in bilateral shoulders when bending over              [facility] OT Assessment - 09/18/20 0001       Assessment   Medical Diagnosis PD    Referring Provider (OT) Dr. Wells Guiles Tat    Onset Date/Surgical Date --   diagnosed 02/2020   Hand Dominance Right    Prior Therapy PT at this location for dizziness/balance in fall of 2021      Precautions    Precautions Fall      Balance Screen   Has the patient fallen in the past 6 months Yes    How many times? 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, clinical, still representing a few companies   phone calls, computer, some travel, mild difficulty texting   Leisure used to play golf (stopped during Covid approx 2 yrs ago), going to hoppers games      ADL   Eating/Feeding --   min difficulty with peas   Grooming --   pt reports min difficulty with rhythm when brushing teeth, min difficulty shaving   Upper Body Bathing --   min difficulty washing hair with LUE   Lower Body Bathing Modified independent    Upper Body Dressing --   min difficulty with fasteners, pulling shirt over   Lower Body Dressing --  min difficulty with balance (standing some)   Toilet Transfer Modified independent    Toileting - Clothing Manipulation Modified independent    Toileting -  Psychologist, clinical Grab bars    Transfers/Ambulation Related to ADL's mod I      IADL   Prior Level of Function Shopping wife performs most.  Pt does some and carries groceries    Prior Level of Function Light Housekeeping Pt does yardwork (reports min shoulder pain reaching down), difficulty picking up items off ground.    Prior Level of Function Meal Prep wife performs most cooking, pt grills without difficulty    Community Mobility Drives own vehicle   min difficulty turning head, min slowing reaction time   Medication Management Is responsible for taking medication in correct dosages at correct time   with routines   Prior Level of Function Financial Management pt performs    Radio broadcast assistant financial matters independently (budgets, writes checks, pays rent, bills goes to bank), collects and keeps track of income   decr size with Scientific laboratory technician Status Independent;History of falls    Mobility Status Comments see PT eval for details      Written Expression   Dominant Hand Right    Handwriting Mild micrographia;100% legible      Vision - History   Baseline Vision Wears glasses only for reading   and 2nd pair of weaker readers to watch tv.  Has appt for visual check in august     Cognition   Overall Cognitive Status Impaired/Different from baseline   to be assesed further in functional context prn   Area of Impairment Attention;Memory    Attention Comments pt reports decr focus, distracted    Memory Decreased short-term memory   difficulty remembering what he's read at times, names, etc   Bradyphrenia Yes      Observation/Other Assessments   Standing Functional Reach Test R-10", L-10"    Other Surveys  Select    Physical Performance Test   Yes    Simulated Eating Time (seconds) 12.38   holds spoon at the end   Donning Doffing Jacket Time (seconds) 12.91sec, feet close together, little trunk movement    Donning Doffing Jacket Comments Fastening/unfastening 3 buttons in 57.12sec      Posture/Postural Control   Posture/Postural Control Postural limitations    Postural Limitations Forward head;Rounded Shoulders      Coordination   9 Hole Peg Test Right;Left    Right 9 Hole Peg Test 27.10    Left 9 Hole Peg Test 27.93    Box and Blocks R-44blocks, L-47blocks    Tremors pt reports resting tremors intermittently in the am      Tone   Assessment Location Right Upper Extremity;Left Upper Extremity      ROM / Strength   AROM / PROM / Strength AROM      AROM   Overall AROM  Within functional limits for tasks performed      RUE Tone   RUE Tone Mild   rigidity     LUE Tone   LUE Tone Mild   rigidity                             OT Education - 09/18/20 1435     Education Details OT Eval results and potential  POC    Person(s) Educated Patient    Methods Explanation    Comprehension Verbalized understanding              OT Short Term Goals - 09/18/20 1447       OT SHORT TERM GOAL #1   Title Pt will be independent with PD-specific HEP--check STGs 10/30/20    Time 4    Period Weeks    Status New      OT SHORT TERM GOAL #2   Title Pt will verbalize understanding of ways to decr risk of future complications related to PD.    Time 4    Period Weeks    Status New      OT SHORT TERM GOAL #3   Title Pt will report incr ease in using LUE to assist in washing hair.    Time 4    Period Weeks    Status New      OT SHORT TERM GOAL #4   Title Pt will don/doff jacket using large amplitude movement strategies (feet apart, associated trunk movements) for incr ease and safety.    Time 4    Period Weeks    Status New               OT Long Term Goals - 09/18/20 1451       OT LONG TERM GOAL #1   Title Pt will verbalize understanding of adaptive strategies for ADLs/IADLs to incr ease/safety, and decr risk of future complications.--check LTGs 11/30/20    Time 8    Period Weeks    Status New      OT LONG TERM GOAL #2   Title Pt will verbalize understanding of memory compensation strategies and ways to keep thinking skills sharp.    Time 8    Period Weeks    Status New      OT LONG TERM GOAL #3   Title Pt will improve bilateral hand coordination and ability to dress as shown by fastening/unfastening 3 buttons in less than or equal to 40sec.    Baseline 57.12sec    Time 8    Period Weeks    Status New      OT LONG TERM GOAL #4   Title Pt improve functional reach and coordination as shown by improving box and block score by at least 3 blocks bilateral.    Baseline R-44 blocks, L-47 blocks    Time 8    Period Weeks    Status New      OT LONG TERM GOAL #5    Title Pt will verbalize understanding of PD related community resources.    Time 8    Period Weeks    Status New                   Plan - 09/18/20 1441     Clinical Impression Statement Pt is a 70 y.o. pt rerferred to occupational therapy for Parkinson's disease.  Pt was newly diagnosed 02/2020.  Pt with PMH that includes:Hypercholesterolemia, HTN, hx of gout, hx small bowel blockage.  Pt is working part-time in Press [employer] and is indpendent, but notices some incr difficulty with balance and ADLs.  Pt presents today with bradykinesia, rigidity, decr coordination, decr posture, decr balance for ADLs/IADLs, cognitive deficits, and tremor.  Pt would benefit from occupational therapy to address these deficits to incr ease with ADLs/IADLs, decr risk of future complications related to PD, and establish PD-specific HEP.  OT Occupational Profile and History Detailed Assessment- Review of Records and additional review of physical, cognitive, psychosocial history related to current functional performance    Occupational performance deficits (Please refer to evaluation for details): ADL's;IADL's;Work;Leisure    Body Structure / Function / Physical Skills ADL;Balance;Decreased knowledge of use of DME;Tone;GMC;Dexterity;UE functional use;IADL;FMC;Coordination;Mobility;Improper spinal/pelvic alignment;Body mechanics;Proprioception    Cognitive Skills Attention;Memory    Rehab Potential Good    Clinical Decision Making Several treatment options, min-mod task modification necessary    Comorbidities Affecting Occupational Performance: May have comorbidities impacting occupational performance    Modification or Assistance to Complete Evaluation  Min-Moderate modification of tasks or assist with assess necessary to complete eval    OT Frequency 2x / week    OT Duration 8 weeks   +eval (delayed start in scheduling), may only need 6 weeks depending on progress   OT Treatment/Interventions Self-care/ADL  training;Moist Heat;DME and/or AE instruction;Balance training;Therapeutic activities;Aquatic Therapy;Cognitive remediation/compensation;Therapeutic exercise;Cryotherapy;Energy conservation;Manual Therapy;Patient/family education;Passive range of motion;Functional Mobility Training;Neuromuscular education    Plan initiate coordination HEP, PWR! hands    Consulted and Agree with Plan of Care Patient             Patient will benefit from skilled therapeutic intervention in order to improve the following deficits and impairments:   Body Structure / Function / Physical Skills: ADL, Balance, Decreased knowledge of use of DME, Tone, GMC, Dexterity, UE functional use, IADL, FMC, Coordination, Mobility, Improper spinal/pelvic alignment, Body mechanics, Proprioception Cognitive Skills: Attention, Memory     Visit Diagnosis: Other lack of coordination  Other symptoms and signs involving the nervous system  Other symptoms and signs involving the musculoskeletal system  Abnormal posture  Unsteadiness on feet  Other abnormalities of gait and mobility  Tremor  Attention and concentration deficit    Problem List Patient Active Problem List   Diagnosis Date Noted   SBO (small bowel obstruction) ([place]) 07/06/2019    [facility] 09/18/2020, 3:12 PM  [facility] [address] [place], [place], [address] Phone: [phone]   Fax:  [phone]  Name: AIMAN NOE MRN: [id number] Date of Birth: 07/11/1950  Vianne Bulls, OTR/L [facility] [address] [place], [place]  [address] [phone] phone [phone] 09/18/20 3:13 PM

## 2020-09-18 NOTE — Patient Instructions (Signed)
  Perform 1x/day, 20 reps

## 2020-09-18 NOTE — Therapy (Signed)
[facility] [address] [place], [place], [address] Phone: [phone]   Fax:  [phone]  Physical Therapy Treatment  Patient Details  Name: Cody Dickerson MRN: [id number] Date of Birth: [birth year] Referring Provider (PT): Dr. Carles Collet   Encounter Date: 09/18/2020   PT End of Session - 09/18/20 1200     Visit Number 4    Number of Visits 13    Date for PT Re-Evaluation 10/22/20    Authorization Type Medicare    PT Start Time 1019    PT Stop Time 1100    PT Time Calculation (min) 41 min    Activity Tolerance Patient tolerated treatment well    Behavior During Therapy [facility] for tasks assessed/performed             Past Medical History:  Diagnosis Date   Balance problem    Gout    Hypercholesterolemia    Hypertension    Small bowel obstruction [facility])     Past Surgical History:  Procedure Laterality Date   BOWEL BLOCKAGE  05/2019    There were no vitals filed for this visit.   Subjective Assessment - 09/18/20 1025     Subjective No falls, did some walking around the neighborhood, but still feel unstable.  Feel unsteady when bending down to pick up sticks in yard.    Pertinent History PD (diagnosed december 2021), Lt distal ICA aneurysm, HTN, chronic dizziness and imbalance    Limitations Walking    Patient Stated Goals wants to get his balance back    Currently in Pain? No/denies                  Neuro Re-education:  PWR! Moves in Standing:  PWR! Up for posture, x 2 sets x 10 reps.  Cues for posture, scapular retraction, elbow extension, and hold time in upright posture.  PWR! Rock for weigthshifting x 20 reps each side with added reach and head turn to look at hand.  Cues for controlled pace  PWR! Twist for trunk rotation, x 10 reps, 2 sets.  Cues to use foot to pivot and head to turn to look at hands clapping, cues to open wide in middle.  PWR! Step for step initiation, x 10 reps, 2  sets.  Cues for coordination of arms reaching and step height out and return to middle.  Discussed purpose of each PWR! Move in standing in connection with daily activities.                 Balance Exercises - 09/18/20 1020       Balance Exercises: Standing   Standing Eyes Opened Wide (BOA);Head turns;Foam/compliant surface;Limitations;Other reps (comment)    Standing Eyes Opened Limitations Head turns x 10, head nods x 10, cues for full eye motion to each side    Stepping Strategy Anterior;Posterior;Lateral;UE support;10 reps;Limitations;Foam/compliant surface    Stepping Strategy Limitations Solid surface, alternating legs, forward/side with stepping over flat black obstacle, using visual cues of mirror to help with posture.  2nd set standing on Airex, cues for foot clearance.    Marching Foam/compliant surface;Upper extremity assist 2;Static;10 reps;Limitations    Marching Limitations Cues for widened BOS    Other Standing Exercises Comments Standing on Airex:  forward>back step and weightshift x 10 reps, then sidestep up and over, each direction x 10.  Partial mini squats on Airex, 5 reps with UE support, 5 reps no UE support (initial forward LOB without support, needing  therapist assist and UE support to recover).               PT Education - 09/18/20 1200     Education Details Addition of PWR! Moves standing to HEP    Person(s) Educated Patient    Methods Explanation;Demonstration;Handout;Verbal cues    Comprehension Verbalized understanding;Returned demonstration;Need further instruction              PT Short Term Goals - 08/23/20 0943       PT SHORT TERM GOAL #1   Title Pt will be independent with initial HEP in order to build upon functional gains made in therapy. ALL STGS DUE 09/20/20    Time 4    Period Weeks    Status New    Target Date 09/20/20      PT SHORT TERM GOAL #2   Title Pt will decr 5x sit <> stand time without UE support to 12 seconds  or less to demo improved transfer efficiency.    Baseline 13.56 seconds    Time 4    Period Weeks    Status New      PT SHORT TERM GOAL #3   Title Pt will verbalize understanding of local Parkinson's disease resources, including options for continued community fitness.    Time 4    Period Weeks    Status New               PT Long Term Goals - 08/23/20 0947       PT LONG TERM GOAL #1   Title Pt will be independent with PD specific HEP in order to build upon functional gains made in therapy. ALL LTGS DUE 10/18/20    Time 8    Period Weeks    Status New    Target Date 10/18/20      PT LONG TERM GOAL #2   Title Pt will recover posterior and anterior balance in push and release test in 1 robust step independently, for improved balance recovery    Baseline 2 smaller steps    Time 8    Period Weeks    Status New      PT LONG TERM GOAL #3   Title Pt will improve miniBEST score to at least a 25/28 in order to demo decr fall risk.    Baseline 22/28    Time 8    Period Weeks    Status New      PT LONG TERM GOAL #4   Title Pt will improve TUG and TUG cog score to less than or equal to 10% difference for improved dual task/decreased fall risk.    Baseline 9 seconds TUG, 12 seconds cog TUG    Time 8    Period Weeks    Status New                   Plan - 09/18/20 1201     Clinical Impression Statement Initiated standing PWR! Moves for HEP this visit.  Pt continues to need cues for technique and still has slight difficulty coordinating UEs and lower extremities, but appears safe to begin them at home.  With step strategy exercises, pt needs cues for foot clearance, despite use of mirror for visual cues.  He will continue to benefit from skilled PT to fruther address balance, posture, gait for improved funcitonal mobility and decreased fall risk.    Personal Factors and Comorbidities Comorbidity 2;Time since onset of injury/illness/exacerbation;Past/Current Experience  Comorbidities PD, h/o small bowel obstruction, HTN, Lt distal ICA aneurysm    Examination-Activity Limitations Locomotion Level;Bend;Squat;Transfers;Reach Overhead    Examination-Participation Restrictions Yard Work;Meal Prep;Shop;Community Activity;Interpersonal Relationship;Other   playing golf   Stability/Clinical Decision Making Stable/Uncomplicated    Rehab Potential Good    PT Frequency 2x / week   12 visits over 6 weeks   PT Duration 8 weeks   12 visits over 6 weeks   PT Treatment/Interventions ADLs/Self Care Home Management;Balance training;Vestibular;Gait training;Therapeutic exercise;Therapeutic activities;Neuromuscular re-education;Patient/family education;DME Instruction;Stair training;Functional mobility training    PT Next Visit Plan Review HEP - stepping strategies.working on B arm swing with gait. postural activities.  balance on compliant surfaces with head motion and with vision removed.    Consulted and Agree with Plan of Care Patient             Patient will benefit from skilled therapeutic intervention in order to improve the following deficits and impairments:  Abnormal gait, Dizziness, Decreased balance, Postural dysfunction, Decreased strength, Difficulty walking  Visit Diagnosis: Abnormal posture  Unsteadiness on feet     Problem List Patient Active Problem List   Diagnosis Date Noted   SBO (small bowel obstruction) ([facility]) 07/06/2019    [facility] W. 09/18/2020, 12:04 PM Frazier Butt., PT   [facility] [address] [place], [place], [address] Phone: [phone]   Fax:  [phone]  Name: Cody Dickerson MRN: [id number] Date of Birth: 02-Aug-1950

## 2020-09-20 ENCOUNTER — Other Ambulatory Visit: Payer: Self-pay

## 2020-09-20 ENCOUNTER — Ambulatory Visit: Payer: Medicare Other | Admitting: Physical Therapy

## 2020-09-20 DIAGNOSIS — R2681 Unsteadiness on feet: Secondary | ICD-10-CM

## 2020-09-20 DIAGNOSIS — R29818 Other symptoms and signs involving the nervous system: Secondary | ICD-10-CM | POA: Diagnosis not present

## 2020-09-20 DIAGNOSIS — R278 Other lack of coordination: Secondary | ICD-10-CM | POA: Diagnosis not present

## 2020-09-20 DIAGNOSIS — R293 Abnormal posture: Secondary | ICD-10-CM

## 2020-09-20 DIAGNOSIS — R29898 Other symptoms and signs involving the musculoskeletal system: Secondary | ICD-10-CM | POA: Diagnosis not present

## 2020-09-20 DIAGNOSIS — R2689 Other abnormalities of gait and mobility: Secondary | ICD-10-CM | POA: Diagnosis not present

## 2020-09-20 NOTE — Therapy (Signed)
[facility] [address] [place], [place], [address] Phone: [phone]   Fax:  [phone]  Physical Therapy Treatment  Patient Details  Name: Cody Dickerson MRN: [id number] Date of Birth: [birth year] Referring Provider (PT): Dr. Carles Collet   Encounter Date: 09/20/2020   PT End of Session - 09/20/20 0939     Visit Number 5    Number of Visits 13    Date for PT Re-Evaluation 10/22/20    Authorization Type Medicare    PT Start Time [phone]   pt in [place]; not on PT schedule, unaware that pt was here   PT Stop Time 1016    PT Time Calculation (min) 38 min    Activity Tolerance Patient tolerated treatment well    Behavior During Therapy [facility] for tasks assessed/performed             Past Medical History:  Diagnosis Date   Balance problem    Gout    Hypercholesterolemia    Hypertension    Small bowel obstruction [facility])     Past Surgical History:  Procedure Laterality Date   BOWEL BLOCKAGE  05/2019    There were no vitals filed for this visit.   Subjective Assessment - 09/20/20 0939     Subjective Exercises been going okay.  I did two of the four, and didn't get back to them.    Pertinent History PD (diagnosed december 2021), Lt distal ICA aneurysm, HTN, chronic dizziness and imbalance    Limitations Walking    Patient Stated Goals wants to get his balance back                       Reviewed PWR! Moves in Standing given last visit, with pt return demo with min cues for technique and intensity:   PWR! Up for posture, x 20 reps.  Initial cues for posture, scapular retraction, elbow extension, and hold time in upright posture.   PWR! Rock for weigthshifting x 20 reps each side with added reach and head turn to look at hand.  Cues for controlled pace   PWR! Twist for trunk rotation, x 10 reps, 2 sets.  Cues to open wide in middle.   PWR! Step for step initiation, x 10 reps, 2 sets.  Cues for  coordination of arms reaching and step height out and return to middle.               Balance Exercises - 09/20/20 0001       Balance Exercises: Standing   Standing Eyes Opened Wide (BOA);Head turns;Foam/compliant surface;Limitations;Other reps (comment)    Standing Eyes Opened Limitations Head turns x 10, head nods x 10, cues for full eye motion to each side    Standing Eyes Closed Wide (BOA);Narrow base of support (BOS);Foam/compliant surface;10 secs;2 reps   increased sway noted   Stepping Strategy Anterior;Posterior;Lateral;UE support;10 reps;Limitations;Foam/compliant surface    Stepping Strategy Limitations standing on Airex, cues for foot clearance.    Other Standing Exercises Standing on compliant surface incline/decline with EO and EC head turns x 5, head nods x 5; marching in place x 10, alt forward step taps x 5 reps with min guard throughout.    Other Standing Exercises Comments Standing on Airex:  forward>back step and weightshift x 10 reps, then sidestep up and over, each direction x 10.  Partial mini squats on Airex, 10 reps with UE support, 5 reps no UE support, then 10 reps  squat to up on toes x 10 reps with UE support                 PT Short Term Goals - 08/23/20 0943       PT SHORT TERM GOAL #1   Title Pt will be independent with initial HEP in order to build upon functional gains made in therapy. ALL STGS DUE 09/20/20    Time 4    Period Weeks    Status New    Target Date 09/20/20      PT SHORT TERM GOAL #2   Title Pt will decr 5x sit <> stand time without UE support to 12 seconds or less to demo improved transfer efficiency.    Baseline 13.56 seconds    Time 4    Period Weeks    Status New      PT SHORT TERM GOAL #3   Title Pt will verbalize understanding of local Parkinson's disease resources, including options for continued community fitness.    Time 4    Period Weeks    Status New               PT Long Term Goals - 08/23/20 0947        PT LONG TERM GOAL #1   Title Pt will be independent with PD specific HEP in order to build upon functional gains made in therapy. ALL LTGS DUE 10/18/20    Time 8    Period Weeks    Status New    Target Date 10/18/20      PT LONG TERM GOAL #2   Title Pt will recover posterior and anterior balance in push and release test in 1 robust step independently, for improved balance recovery    Baseline 2 smaller steps    Time 8    Period Weeks    Status New      PT LONG TERM GOAL #3   Title Pt will improve miniBEST score to at least a 25/28 in order to demo decr fall risk.    Baseline 22/28    Time 8    Period Weeks    Status New      PT LONG TERM GOAL #4   Title Pt will improve TUG and TUG cog score to less than or equal to 10% difference for improved dual task/decreased fall risk.    Baseline 9 seconds TUG, 12 seconds cog TUG    Time 8    Period Weeks    Status New                   Plan - 09/20/20 1024     Clinical Impression Statement Reviewed PWR! Moves this visit, with pt needing min cues for technique for coordination of UE and lower extremities.  Pt appears to have improved postural awareness for looking ahead/less looking down at ground this visit and reports more awareness at home for posture.  He will continue to benefit from skilled PT to further address posture, balance, gait for improved functional mobility and decreased fall risk.    Personal Factors and Comorbidities Comorbidity 2;Time since onset of injury/illness/exacerbation;Past/Current Experience    Comorbidities PD, h/o small bowel obstruction, HTN, Lt distal ICA aneurysm    Examination-Activity Limitations Locomotion Level;Bend;Squat;Transfers;Reach Overhead    Examination-Participation Restrictions Yard Work;Meal Prep;Shop;Community Activity;Interpersonal Relationship;Other   playing golf   Stability/Clinical Decision Making Stable/Uncomplicated    Rehab Potential Good    PT Frequency 2x / week  12 visits over 6 weeks   PT Duration 8 weeks   12 visits over 6 weeks   PT Treatment/Interventions ADLs/Self Care Home Management;Balance training;Vestibular;Gait training;Therapeutic exercise;Therapeutic activities;Neuromuscular re-education;Patient/family education;DME Instruction;Stair training;Functional mobility training    PT Next Visit Plan Please check STGs; Review HEP; continue to work on stepping strategies.working on B arm swing with gait. postural activities.  balance on compliant surfaces with head motion and with vision removed.    Consulted and Agree with Plan of Care Patient             Patient will benefit from skilled therapeutic intervention in order to improve the following deficits and impairments:  Abnormal gait, Dizziness, Decreased balance, Postural dysfunction, Decreased strength, Difficulty walking  Visit Diagnosis: Unsteadiness on feet  Abnormal posture     Problem List Patient Active Problem List   Diagnosis Date Noted   SBO (small bowel obstruction) ([place]) 07/06/2019    Hilarie Sinha W. 09/20/2020, 10:29 AM Frazier Butt., PT  [facility] [address] [place], [place], [address] Phone: [phone]   Fax:  [phone]  Name: Cody Dickerson MRN: [id number] Date of Birth: [birth year]

## 2020-09-21 ENCOUNTER — Ambulatory Visit: Payer: Medicare Other

## 2020-09-21 DIAGNOSIS — R0989 Other specified symptoms and signs involving the circulatory and respiratory systems: Secondary | ICD-10-CM | POA: Diagnosis not present

## 2020-09-21 DIAGNOSIS — R011 Cardiac murmur, unspecified: Secondary | ICD-10-CM | POA: Diagnosis not present

## 2020-09-21 DIAGNOSIS — I6523 Occlusion and stenosis of bilateral carotid arteries: Secondary | ICD-10-CM | POA: Diagnosis not present

## 2020-09-25 ENCOUNTER — Other Ambulatory Visit: Payer: Self-pay

## 2020-09-25 ENCOUNTER — Ambulatory Visit: Payer: Medicare Other

## 2020-09-25 DIAGNOSIS — R278 Other lack of coordination: Secondary | ICD-10-CM | POA: Diagnosis not present

## 2020-09-25 DIAGNOSIS — R29818 Other symptoms and signs involving the nervous system: Secondary | ICD-10-CM | POA: Diagnosis not present

## 2020-09-25 DIAGNOSIS — R2681 Unsteadiness on feet: Secondary | ICD-10-CM | POA: Diagnosis not present

## 2020-09-25 DIAGNOSIS — R293 Abnormal posture: Secondary | ICD-10-CM

## 2020-09-25 DIAGNOSIS — R2689 Other abnormalities of gait and mobility: Secondary | ICD-10-CM

## 2020-09-25 DIAGNOSIS — R29898 Other symptoms and signs involving the musculoskeletal system: Secondary | ICD-10-CM | POA: Diagnosis not present

## 2020-09-25 NOTE — Therapy (Signed)
[facility] [address] [place], [place], [address] Phone: [phone]   Fax:  [phone]  Physical Therapy Treatment  Patient Details  Name: Cody Dickerson MRN: [id number] Date of Birth: 1950/04/07 Referring Provider (PT): Dr. Carles Collet   Encounter Date: 09/25/2020   PT End of Session - 09/25/20 1019     Visit Number 6    Number of Visits 13    Date for PT Re-Evaluation 10/22/20    Authorization Type Medicare    PT Start Time 1017    PT Stop Time 1058    PT Time Calculation (min) 41 min    Activity Tolerance Patient tolerated treatment well    Behavior During Therapy [facility] for tasks assessed/performed             Past Medical History:  Diagnosis Date   Balance problem    Gout    Hypercholesterolemia    Hypertension    Small bowel obstruction [facility])     Past Surgical History:  Procedure Laterality Date   BOWEL BLOCKAGE  05/2019    There were no vitals filed for this visit.   Subjective Assessment - 09/25/20 1020     Subjective Patient denies any new changes/complaints. No falls. Has been trying the exercises at home.    Pertinent History PD (diagnosed december 2021), Lt distal ICA aneurysm, HTN, chronic dizziness and imbalance    Limitations Walking    Patient Stated Goals wants to get his balance back    Currently in Pain? No/denies               [facility] Adult PT Treatment/Exercise - 09/25/20 0001       Transfers   Transfers Sit to Stand    Sit to Stand 5: Supervision    Five time sit to stand comments  13.35 seconds; no UE support from standard height chair    Stand to Sit 5: Supervision      Ambulation/Gait   Ambulation/Gait Yes    Ambulation/Gait Assistance 5: Supervision    Ambulation/Gait Assistance Details completed ambulation with walking sticks for improved bilat arm swing x 400 ft, then followed with ambulation without addition of walking stick x 400 ft.    Ambulation Distance  (Feet) 400 Feet   x1 with walking sticks, x 400 without walking sticks   Assistive device None    Gait Pattern Step-through pattern;Decreased arm swing - right;Decreased arm swing - left;Trunk flexed    Ambulation Surface Level;Indoor              Balance Exercises - 09/25/20 0001       Balance Exercises: Standing   Standing Eyes Opened Wide (BOA);Narrow base of support (BOS);Head turns;Foam/compliant surface;Limitations    Standing Eyes Opened Limitations standing on airex working from wide BOS to narrow BOS completed horiz/vertical head turns 2 x 10 reps each    Standing Eyes Closed Narrow base of support (BOS);Foam/compliant surface;Wide (BOA);3 reps;20 secs;Limitations    Standing Eyes Closed Limitations standing with wide BOS working toward more narrow BOS standing eyes closed 3 x 20 seconds, intermittent touch A    Stepping Strategy Anterior;Posterior;Lateral;Foam/compliant surface;Limitations    Stepping Strategy Limitations standing on airex, intermitent touch to // bars and cues for foot position/clearance            Reviewed PWR! Moves in Standing as HEP Review:   PWR! Up for posture, x 10 reps. Cues for posture, scapular retraction, elbow extension, and hold time  in upright posture.   PWR! Rock for weigthshifting x 10 reps each side with added reach and head turn to look at hand.  Cues for controlled pace   PWR! Twist for trunk rotation, x 10 reps, 2 sets.  Cues to maintain open wide in middle.   PWR! Step for step initiation, x 10 reps, 2 sets.  Cues for coordination of arms reaching and step as patient initially only demo LE movement with exercise    PT Education - 09/25/20 1059     Education Details Review HEP; Progress toward STGs    Person(s) Educated Patient    Methods Explanation    Comprehension Verbalized understanding              PT Short Term Goals - 09/25/20 1025       PT SHORT TERM GOAL #1   Title Pt will be independent with initial HEP  in order to build upon functional gains made in therapy. ALL STGS DUE 09/20/20    Baseline require verbal cues for proper completion of PWR Up, completing daily per paitent reports    Time 4    Period Weeks    Status Partially Met    Target Date 09/20/20      PT SHORT TERM GOAL #2   Title Pt will decr 5x sit <> stand time without UE support to 12 seconds or less to demo improved transfer efficiency.    Baseline 13.56 seconds; 13.35 seconds    Time 4    Period Weeks    Status Not Met      PT SHORT TERM GOAL #3   Title Pt will verbalize understanding of local Parkinson's disease resources, including options for continued community fitness.    Baseline provided handout' verbalzied understanding    Time 4    Period Weeks    Status Achieved               PT Long Term Goals - 08/23/20 0947       PT LONG TERM GOAL #1   Title Pt will be independent with PD specific HEP in order to build upon functional gains made in therapy. ALL LTGS DUE 10/18/20    Time 8    Period Weeks    Status New    Target Date 10/18/20      PT LONG TERM GOAL #2   Title Pt will recover posterior and anterior balance in push and release test in 1 robust step independently, for improved balance recovery    Baseline 2 smaller steps    Time 8    Period Weeks    Status New      PT LONG TERM GOAL #3   Title Pt will improve miniBEST score to at least a 25/28 in order to demo decr fall risk.    Baseline 22/28    Time 8    Period Weeks    Status New      PT LONG TERM GOAL #4   Title Pt will improve TUG and TUG cog score to less than or equal to 10% difference for improved dual task/decreased fall risk.    Baseline 9 seconds TUG, 12 seconds cog TUG    Time 8    Period Weeks    Status New                   Plan - 09/25/20 1103     Clinical Impression Statement Assessed patient's progress toward STG. Patient  able to meet STG 1 and 3 today demonstrating independence with HEP and verbalize  understanding of PD resources and community exercise. Patient improved 5x sit <> stand to 13.35 seconds but shy of goal level at this time. Reviewed PWR moves HEP with intermittent cues still required, especially noted with PWR Up. Continued balance activities on complaint surfaces and working on improved stepping strategy. Will continue to progress toward all LTGs.    Personal Factors and Comorbidities Comorbidity 2;Time since onset of injury/illness/exacerbation;Past/Current Experience    Comorbidities PD, h/o small bowel obstruction, HTN, Lt distal ICA aneurysm    Examination-Activity Limitations Locomotion Level;Bend;Squat;Transfers;Reach Overhead    Examination-Participation Restrictions Yard Work;Meal Prep;Shop;Community Activity;Interpersonal Relationship;Other   playing golf   Stability/Clinical Decision Making Stable/Uncomplicated    Rehab Potential Good    PT Frequency 2x / week   12 visits over 6 weeks   PT Duration 8 weeks   12 visits over 6 weeks   PT Treatment/Interventions ADLs/Self Care Home Management;Balance training;Vestibular;Gait training;Therapeutic exercise;Therapeutic activities;Neuromuscular re-education;Patient/family education;DME Instruction;Stair training;Functional mobility training    PT Next Visit Plan Continue PWR moves. continue to work on stepping strategies.working on B arm swing with gait. postural activities.  balance on compliant surfaces with head motion and with vision removed.    Consulted and Agree with Plan of Care Patient             Patient will benefit from skilled therapeutic intervention in order to improve the following deficits and impairments:  Abnormal gait, Dizziness, Decreased balance, Postural dysfunction, Decreased strength, Difficulty walking  Visit Diagnosis: Unsteadiness on feet  Abnormal posture  Other abnormalities of gait and mobility     Problem List Patient Active Problem List   Diagnosis Date Noted   SBO (small bowel  obstruction) ([place]) 07/06/2019    Jones Bales, PT, DPT 09/25/2020, 12:53 PM  [facility] [address] [place], [place], [address] Phone: [phone]   Fax:  [phone]  Name: Cody Dickerson MRN: [id number] Date of Birth: 09/24/1950

## 2020-09-26 ENCOUNTER — Telehealth: Payer: Self-pay | Admitting: Cardiology

## 2020-09-26 NOTE — Telephone Encounter (Signed)
Pt has concerns about BP and new meds Dr. Einar Gip placed him on. Requesting a call from a MA.  Call back number [phone]

## 2020-09-27 ENCOUNTER — Other Ambulatory Visit: Payer: Self-pay

## 2020-09-27 ENCOUNTER — Encounter: Payer: Self-pay | Admitting: Physical Therapy

## 2020-09-27 ENCOUNTER — Ambulatory Visit: Payer: Medicare Other | Admitting: Physical Therapy

## 2020-09-27 DIAGNOSIS — R2681 Unsteadiness on feet: Secondary | ICD-10-CM | POA: Diagnosis not present

## 2020-09-27 DIAGNOSIS — R293 Abnormal posture: Secondary | ICD-10-CM

## 2020-09-27 DIAGNOSIS — R278 Other lack of coordination: Secondary | ICD-10-CM | POA: Diagnosis not present

## 2020-09-27 DIAGNOSIS — R29898 Other symptoms and signs involving the musculoskeletal system: Secondary | ICD-10-CM | POA: Diagnosis not present

## 2020-09-27 DIAGNOSIS — R29818 Other symptoms and signs involving the nervous system: Secondary | ICD-10-CM | POA: Diagnosis not present

## 2020-09-27 DIAGNOSIS — R2689 Other abnormalities of gait and mobility: Secondary | ICD-10-CM | POA: Diagnosis not present

## 2020-09-27 NOTE — Telephone Encounter (Signed)
Pt responded on mychart and agreed to hold amlodipine and monitor his BP.

## 2020-09-27 NOTE — Telephone Encounter (Signed)
I sent a MyChart response.  I agree with the PCP to stop the medication.

## 2020-09-27 NOTE — Telephone Encounter (Signed)
Amplodine was reduced from 5 to 2.5 and pt states he had some low bp last week and he stopped taking it. His PCP told him to stay off of it as long BP stays under 135. BP was 93/59 last week when he checked it. Right now it is 120/79. Pt would like to know if you agree with his PCP.

## 2020-09-27 NOTE — Therapy (Signed)
[facility] [address] [place], [place], [address] Phone: [phone]   Fax:  [phone]  Physical Therapy Treatment  Patient Details  Name: Cody Dickerson MRN: [id number] Date of Birth: [birth year] Referring Provider (PT): Dr. Carles Collet   Encounter Date: 09/27/2020   PT End of Session - 09/27/20 1221     Visit Number 7    Number of Visits 13    Date for PT Re-Evaluation 10/22/20    Authorization Type Medicare    PT Start Time 1019    PT Stop Time 1059    PT Time Calculation (min) 40 min    Activity Tolerance Patient tolerated treatment well    Behavior During Therapy [facility] for tasks assessed/performed             Past Medical History:  Diagnosis Date   Balance problem    Gout    Hypercholesterolemia    Hypertension    Small bowel obstruction [facility])     Past Surgical History:  Procedure Laterality Date   BOWEL BLOCKAGE  05/2019    There were no vitals filed for this visit.   Subjective Assessment - 09/27/20 1020     Subjective Reports feeling more unsteady when he is walking.    Pertinent History PD (diagnosed december 2021), Lt distal ICA aneurysm, HTN, chronic dizziness and imbalance    Limitations Walking    Patient Stated Goals wants to get his balance back    Currently in Pain? No/denies                               [facility] Adult PT Treatment/Exercise - 09/27/20 0001       Ambulation/Gait   Ambulation/Gait Yes    Ambulation/Gait Assistance 5: Supervision    Ambulation/Gait Assistance Details throughout session with cues for stride length and arm swing    Assistive device None    Gait Pattern Step-through pattern;Decreased arm swing - right;Decreased arm swing - left;Trunk flexed    Ambulation Surface Level;Indoor                 Balance Exercises - 09/27/20 1048       Balance Exercises: Standing   Standing Eyes Closed Narrow base of support  (BOS);Foam/compliant surface    Standing Eyes Closed Limitations feet together 2 x 15 seconds, feet hip width 2 x 30 seconds    Rockerboard Anterior/posterior;Limitations    Rockerboard Limitations weight shifting with focus on hip/ankle strategy x15 reps with cues for technique and pt needing to grab onto bars at times.    Marching Foam/compliant surface;Solid surface;Limitations    Marching Limitations static marching x10 reps on level ground, x10 reps on blue air ex without UE support with cues for slowed and controlled.    Other Standing Exercises on blue air ex in corner: trunk rotations reaching across body to target laterally x5 reps B, then superior/laterally x5 reps B, cues for widened BOS   Other Standing Exercises Comments standing on air ex with BUE support: x10 reps heel toe raises            Reviewed PWR! Moves in Standing:    PWR! Up for posture, x 20 reps.  Initial cues for posture, widened BOS and hold time in upright posture.   PWR! Rock for weigthshifting x 20 reps each side with added reach and head turn to look at hand.  Cues for controlled  pace and hold time with reach    PWR! Twist for trunk rotation, x 10 reps B.  Cues to open wide in middle each time.    PWR! Step for step initiation, x 10 reps, 2 sets. Cues for coordination of arms reaching and step height out and return to middle. Performed 2nd set with use of 2" obstacle to step over for incr step height.   Cues for technique and intensity.     Access Code: [email or web address] URL: [email or web address] Date: 09/27/2020 Prepared by: Janann August  New additions below for standing balance, see MedBridge for more details:   Exercises Standing Balance with Eyes Closed on Foam - 1-2 x daily - 5 x weekly - 3 sets - 30 hold Standing Marching - 1 x daily - 5 x weekly - 2 sets - 10 reps   PT Education - 09/27/20 1221     Education Details standing balance additions to HEP    Person(s) Educated  Patient    Methods Demonstration;Explanation;Handout    Comprehension Verbalized understanding;Returned demonstration              PT Short Term Goals - 09/25/20 1025       PT SHORT TERM GOAL #1   Title Pt will be independent with initial HEP in order to build upon functional gains made in therapy. ALL STGS DUE 09/20/20    Baseline require verbal cues for proper completion of PWR Up, completing daily per paitent reports    Time 4    Period Weeks    Status Partially Met    Target Date 09/20/20      PT SHORT TERM GOAL #2   Title Pt will decr 5x sit <> stand time without UE support to 12 seconds or less to demo improved transfer efficiency.    Baseline 13.56 seconds; 13.35 seconds    Time 4    Period Weeks    Status Not Met      PT SHORT TERM GOAL #3   Title Pt will verbalize understanding of local Parkinson's disease resources, including options for continued community fitness.    Baseline provided handout' verbalzied understanding    Time 4    Period Weeks    Status Achieved               PT Long Term Goals - 08/23/20 0947       PT LONG TERM GOAL #1   Title Pt will be independent with PD specific HEP in order to build upon functional gains made in therapy. ALL LTGS DUE 10/18/20    Time 8    Period Weeks    Status New    Target Date 10/18/20      PT LONG TERM GOAL #2   Title Pt will recover posterior and anterior balance in push and release test in 1 robust step independently, for improved balance recovery    Baseline 2 smaller steps    Time 8    Period Weeks    Status New      PT LONG TERM GOAL #3   Title Pt will improve miniBEST score to at least a 25/28 in order to demo decr fall risk.    Baseline 22/28    Time 8    Period Weeks    Status New      PT LONG TERM GOAL #4   Title Pt will improve TUG and TUG cog score to less than or equal to 10% difference for improved dual  task/decreased fall risk.    Baseline 9 seconds TUG, 12 seconds cog TUG    Time  8    Period Weeks    Status New                   Plan - 09/27/20 1223     Clinical Impression Statement Performed PWR moves at start of session with pt needing min verbal/demo cues for technique especially to slow down pace at times with [facility]. Added standing corner balance with vision removed on a compliant surface and standing marching with focus on slowed and controlled for SLS. Pt tolerated well, will continue to progress towards LTGs.    Personal Factors and Comorbidities Comorbidity 2;Time since onset of injury/illness/exacerbation;Past/Current Experience    Comorbidities PD, h/o small bowel obstruction, HTN, Lt distal ICA aneurysm    Examination-Activity Limitations Locomotion Level;Bend;Squat;Transfers;Reach Overhead    Examination-Participation Restrictions Yard Work;Meal Prep;Shop;Community Activity;Interpersonal Relationship;Other   playing golf   Stability/Clinical Decision Making Stable/Uncomplicated    Rehab Potential Good    PT Frequency 2x / week   12 visits over 6 weeks   PT Duration 8 weeks   12 visits over 6 weeks   PT Treatment/Interventions ADLs/Self Care Home Management;Balance training;Vestibular;Gait training;Therapeutic exercise;Therapeutic activities;Neuromuscular re-education;Patient/family education;DME Instruction;Stair training;Functional mobility training    PT Next Visit Plan Continue PWR moves (try on unlevel surfaces)?. continue to work on stepping strategies.working on B arm swing with gait. postural activities.  balance on compliant surfaces with head motion and with vision removed.    Consulted and Agree with Plan of Care Patient             Patient will benefit from skilled therapeutic intervention in order to improve the following deficits and impairments:  Abnormal gait, Dizziness, Decreased balance, Postural dysfunction, Decreased strength, Difficulty walking  Visit Diagnosis: Unsteadiness on feet  Abnormal posture  Other  abnormalities of gait and mobility     Problem List Patient Active Problem List   Diagnosis Date Noted   SBO (small bowel obstruction) ([place]) 07/06/2019    Arliss Journey, PT, DPT  09/27/2020, 12:24 PM  [facility] [address] [place], [place], [address] Phone: [phone]   Fax:  [phone]  Name: JELANI TRUEBA MRN: [id number] Date of Birth: [birth year]

## 2020-10-02 ENCOUNTER — Other Ambulatory Visit: Payer: Self-pay

## 2020-10-02 ENCOUNTER — Encounter: Payer: Self-pay | Admitting: Physical Therapy

## 2020-10-02 ENCOUNTER — Ambulatory Visit: Payer: Medicare Other | Attending: Neurology | Admitting: Physical Therapy

## 2020-10-02 DIAGNOSIS — R41840 Attention and concentration deficit: Secondary | ICD-10-CM | POA: Diagnosis not present

## 2020-10-02 DIAGNOSIS — R2689 Other abnormalities of gait and mobility: Secondary | ICD-10-CM | POA: Diagnosis not present

## 2020-10-02 DIAGNOSIS — R29818 Other symptoms and signs involving the nervous system: Secondary | ICD-10-CM | POA: Diagnosis not present

## 2020-10-02 DIAGNOSIS — R2681 Unsteadiness on feet: Secondary | ICD-10-CM | POA: Insufficient documentation

## 2020-10-02 DIAGNOSIS — R293 Abnormal posture: Secondary | ICD-10-CM | POA: Diagnosis not present

## 2020-10-02 DIAGNOSIS — R278 Other lack of coordination: Secondary | ICD-10-CM | POA: Insufficient documentation

## 2020-10-02 DIAGNOSIS — R251 Tremor, unspecified: Secondary | ICD-10-CM | POA: Diagnosis not present

## 2020-10-02 DIAGNOSIS — R29898 Other symptoms and signs involving the musculoskeletal system: Secondary | ICD-10-CM | POA: Diagnosis not present

## 2020-10-02 NOTE — Therapy (Signed)
[facility] [address] [place], [place], [address] Phone: [phone]   Fax:  [phone]  Physical Therapy Treatment  Patient Details  Name: Cody Dickerson MRN: [id number] Date of Birth: 1950-06-06 Referring Provider (PT): Dr. Carles Collet   Encounter Date: 10/02/2020   PT End of Session - 10/02/20 1132     Visit Number 8    Number of Visits 13    Date for PT Re-Evaluation 10/22/20    Authorization Type Medicare    PT Start Time 1018    PT Stop Time 1100    PT Time Calculation (min) 42 min    Activity Tolerance Patient tolerated treatment well    Behavior During Therapy [facility] for tasks assessed/performed             Past Medical History:  Diagnosis Date   Balance problem    Gout    Hypercholesterolemia    Hypertension    Small bowel obstruction [facility])     Past Surgical History:  Procedure Laterality Date   BOWEL BLOCKAGE  05/2019    There were no vitals filed for this visit.   Subjective Assessment - 10/02/20 1020     Subjective Did his exercises over the weekend - they went well. Did his walk this morning - is on outdoor surfaces and balance feels unsteady. Tries to walk about 20-30 minutes (depending on the weather).    Pertinent History PD (diagnosed december 2021), Lt distal ICA aneurysm, HTN, chronic dizziness and imbalance    Limitations Walking    Patient Stated Goals wants to get his balance back    Currently in Pain? No/denies                               [facility] Adult PT Treatment/Exercise - 10/02/20 1029       Ambulation/Gait   Ambulation/Gait Yes    Ambulation/Gait Assistance 4: Min guard    Ambulation/Gait Assistance Details due to pt reporting feeling unsteady with balance on unlevel surfaces when performing his walking program outdoors, performed gait working with single walking pole in pt's RUE with demo and hand over hand cues for proper sequencing and use of walking  pole for posture. pt unable to maintain sequencing without hand over hand from therapist and pt reporting feeling more unsteady using walking pole. discussed rationale of trying to use for balance when outdoors, would need more practice. remainder of session worked on gait with no AD with cues for posture and arm swing    Ambulation Distance (Feet) 345 Feet    Assistive device None   single walking pole   Gait Pattern Step-through pattern;Decreased arm swing - right;Decreased arm swing - left;Trunk flexed    Ambulation Surface Level;Indoor      Posture/Postural Control   Posture/Postural Control Postural limitations    Postural Limitations Forward head;Rounded Shoulders    Posture Comments standing against wall for postural exercise with pillow behind head,x10 reps scapular retraction with cues for PWR Up posture and tactile cues. remainder of session used cue for stand "tall as the wall" with gait                 Balance Exercises - 10/02/20 1029       Balance Exercises: Standing   SLS with Vectors Foam/compliant surface    SLS with Vectors Limitations standing on blue air ex; alternating foot taps to 6" step x10 reps  B, then modified SLS standing on LLE and alternating UE lifts x3 reps x2 sets, performed same activity with RLE. then performed modified SLS with trunk rotations reaching across body to target x5 reps B, incr difficulty with SLS on LLE    Wall Bumps Hip;10 reps;Limitations   2 sets   Wall Bumps Limitations on blue foam in // bars, cues for proper technique and posture    Stepping Strategy Anterior;Posterior;Foam/compliant surface    Stepping Strategy Limitations standing on blue foam beam, cues for foot clearance and weight shift    Sidestepping 4 reps;Limitations    Sidestepping Limitations standing on blue foam beam; cues for step height and to perform slowly for incr SLS time    Other Standing Exercises on blue air ex: standing PWR Up x20 reps with initial cues for  technique                 PT Short Term Goals - 09/25/20 1025       PT SHORT TERM GOAL #1   Title Pt will be independent with initial HEP in order to build upon functional gains made in therapy. ALL STGS DUE 09/20/20    Baseline require verbal cues for proper completion of PWR Up, completing daily per paitent reports    Time 4    Period Weeks    Status Partially Met    Target Date 09/20/20      PT SHORT TERM GOAL #2   Title Pt will decr 5x sit <> stand time without UE support to 12 seconds or less to demo improved transfer efficiency.    Baseline 13.56 seconds; 13.35 seconds    Time 4    Period Weeks    Status Not Met      PT SHORT TERM GOAL #3   Title Pt will verbalize understanding of local Parkinson's disease resources, including options for continued community fitness.    Baseline provided handout' verbalzied understanding    Time 4    Period Weeks    Status Achieved               PT Long Term Goals - 08/23/20 0947       PT LONG TERM GOAL #1   Title Pt will be independent with PD specific HEP in order to build upon functional gains made in therapy. ALL LTGS DUE 10/18/20    Time 8    Period Weeks    Status New    Target Date 10/18/20      PT LONG TERM GOAL #2   Title Pt will recover posterior and anterior balance in push and release test in 1 robust step independently, for improved balance recovery    Baseline 2 smaller steps    Time 8    Period Weeks    Status New      PT LONG TERM GOAL #3   Title Pt will improve miniBEST score to at least a 25/28 in order to demo decr fall risk.    Baseline 22/28    Time 8    Period Weeks    Status New      PT LONG TERM GOAL #4   Title Pt will improve TUG and TUG cog score to less than or equal to 10% difference for improved dual task/decreased fall risk.    Baseline 9 seconds TUG, 12 seconds cog TUG    Time 8    Period Weeks    Status New  Plan - 10/02/20 1134     Clinical  Impression Statement Attempted to use single walking pole for gait training today for possible use when pt ambulates outdoors during his walking program (pt reports feeling unsteady on unlevel surfaces). Pt needing hand over hand cues for proper sequencing as pt easily gets off sequence and reports that he does not feel steady using it. Remainder of session focused on balance on compliant surfaces and postural exercises. Pt challenged with SLS activities more on LLE. Cues throughout for posture and looking ahead. Will continue to progress towards LTGs.    Personal Factors and Comorbidities Comorbidity 2;Time since onset of injury/illness/exacerbation;Past/Current Experience    Comorbidities PD, h/o small bowel obstruction, HTN, Lt distal ICA aneurysm    Examination-Activity Limitations Locomotion Level;Bend;Squat;Transfers;Reach Overhead    Examination-Participation Restrictions Yard Work;Meal Prep;Shop;Community Activity;Interpersonal Relationship;Other   playing golf   Stability/Clinical Decision Making Stable/Uncomplicated    Rehab Potential Good    PT Frequency 2x / week   12 visits over 6 weeks   PT Duration 8 weeks   12 visits over 6 weeks   PT Treatment/Interventions ADLs/Self Care Home Management;Balance training;Vestibular;Gait training;Therapeutic exercise;Therapeutic activities;Neuromuscular re-education;Patient/family education;DME Instruction;Stair training;Functional mobility training    PT Next Visit Plan try standing PWR on compliant surface. add any other balance for home on unlevel surfaces. continue to work on stepping strategies.working on B arm swing with gait. postural activities.  balance on compliant surfaces with head motion and with vision removed. continue to try walking stick for outdoor gait for balance?    Consulted and Agree with Plan of Care Patient             Patient will benefit from skilled therapeutic intervention in order to improve the following deficits and  impairments:  Abnormal gait, Dizziness, Decreased balance, Postural dysfunction, Decreased strength, Difficulty walking  Visit Diagnosis: Unsteadiness on feet  Abnormal posture  Other abnormalities of gait and mobility     Problem List Patient Active Problem List   Diagnosis Date Noted   SBO (small bowel obstruction) ([place]) 07/06/2019    Arliss Journey, PT, DPT  10/02/2020, 11:37 AM  [facility] [address] [place], [place], [address] Phone: [phone]   Fax:  [phone]  Name: Cody Dickerson MRN: [id number] Date of Birth: Apr 28, 1950

## 2020-10-04 ENCOUNTER — Encounter: Payer: Self-pay | Admitting: Physical Therapy

## 2020-10-04 ENCOUNTER — Other Ambulatory Visit: Payer: Self-pay

## 2020-10-04 ENCOUNTER — Ambulatory Visit: Payer: Medicare Other | Admitting: Physical Therapy

## 2020-10-04 DIAGNOSIS — R2681 Unsteadiness on feet: Secondary | ICD-10-CM

## 2020-10-04 DIAGNOSIS — R29818 Other symptoms and signs involving the nervous system: Secondary | ICD-10-CM | POA: Diagnosis not present

## 2020-10-04 DIAGNOSIS — R29898 Other symptoms and signs involving the musculoskeletal system: Secondary | ICD-10-CM | POA: Diagnosis not present

## 2020-10-04 DIAGNOSIS — R2689 Other abnormalities of gait and mobility: Secondary | ICD-10-CM

## 2020-10-04 DIAGNOSIS — R293 Abnormal posture: Secondary | ICD-10-CM | POA: Diagnosis not present

## 2020-10-04 DIAGNOSIS — R278 Other lack of coordination: Secondary | ICD-10-CM | POA: Diagnosis not present

## 2020-10-04 NOTE — Therapy (Signed)
[facility] [address] [place], [place], [address] Phone: [phone]   Fax:  [phone]  Physical Therapy Treatment  Patient Details  Name: NIAL HAWE MRN: [id number] Date of Birth: 1950-09-14 Referring Provider (PT): Dr. Carles Collet   Encounter Date: 10/04/2020   PT End of Session - 10/04/20 1021     Visit Number 9    Number of Visits 13    Date for PT Re-Evaluation 10/22/20    Authorization Type Medicare    PT Start Time 1021    PT Stop Time 1100    PT Time Calculation (min) 39 min    Activity Tolerance Patient tolerated treatment well    Behavior During Therapy [facility] for tasks assessed/performed             Past Medical History:  Diagnosis Date   Balance problem    Gout    Hypercholesterolemia    Hypertension    Small bowel obstruction [facility]     Past Surgical History:  Procedure Laterality Date   BOWEL BLOCKAGE  05/2019    There were no vitals filed for this visit.   Subjective Assessment - 10/04/20 1021     Subjective No pain, still some imbalance.  Anytime I'm standing, notice the imbalance.  No falls.    Pertinent History PD (diagnosed december 2021), Lt distal ICA aneurysm, HTN, chronic dizziness and imbalance    Limitations Walking    Patient Stated Goals wants to get his balance back    Currently in Pain? No/denies                               [facility] Adult PT Treatment/Exercise - 10/04/20 0001       Ambulation/Gait   Ambulation/Gait Yes    Ambulation/Gait Assistance 4: Min guard    Ambulation/Gait Assistance Details Attempted trial again with single walking pole.  Pt needs verbal, tactile cues, occasional hand over hand assist for sequence.  Cues for increased step length, deliberate placement (audio cue to hear pole) with LLE step.  Pt gets off sequence at times and needs assist to get back to seqeunce.    Ambulation Distance (Feet) 230 Feet   200   Assistive  device Other (Comment)   single walking pole   Gait Pattern Step-through pattern;Decreased arm swing - right;Decreased arm swing - left;Trunk flexed    Ambulation Surface Level;Indoor                 Balance Exercises - 10/04/20 0001       Balance Exercises: Standing   Standing Eyes Opened Wide (BOA);Narrow base of support (BOS);Head turns;Foam/compliant surface;Limitations    Standing Eyes Opened Limitations standing on airex working from wide BOS to narrow BOS completed horiz/vertical head turns 2 x 5 reps each    Standing Eyes Closed Narrow base of support (BOS);Foam/compliant surface    Standing Eyes Closed Limitations feet together 2 x 15 seconds, feet hip width 2 x 30 seconds    Stepping Strategy Anterior;Posterior;Foam/compliant surface;Lateral;10 reps;Limitations    Stepping Strategy Limitations Standing on Airex:  2 sets each.  First set step and weigthshift x 10 with same leg, return to midline on Airex.  Second set alteranting legs, cues for posture, foot clearance, widened BOS upon return to midline.    Partial Tandem Stance Eyes open;Eyes closed;Upper extremity support 2;Limitations    Partial Tandem Stance Limitations EO head turns/nods x  5 each, then EC head steady x 15 seconds; more unsteadiness with LLE posterior.  Min UE support throughout at chair.    Heel Raises Both;10 reps   on Airex   Toe Raise Both;10 reps   on airex   Other Standing Exercises On Airex:  forward<>back step and weightshift x 10 reps each leg, cues for foot clearance, step length.  One LOB posteriorly trying to transition between legs.    Other Standing Exercises Comments Alternating heel raises x 10 reps, 2 sets with cues for increased weightshift side to side.               PT Education - 10/04/20 1206     Education Details Answered pt's questions about purpose of walking pole (stability, posture, increased step length) and that pt may benefit from continued practice/trial prior to any  recommendations for walking pole for home.    Person(s) Educated Patient    Methods Explanation;Demonstration;Verbal cues;Tactile cues    Comprehension Verbalized understanding;Returned demonstration;Verbal cues required;Tactile cues required;Need further instruction              PT Short Term Goals - 09/25/20 1025       PT SHORT TERM GOAL #1   Title Pt will be independent with initial HEP in order to build upon functional gains made in therapy. ALL STGS DUE 09/20/20    Baseline require verbal cues for proper completion of PWR Up, completing daily per paitent reports    Time 4    Period Weeks    Status Partially Met    Target Date 09/20/20      PT SHORT TERM GOAL #2   Title Pt will decr 5x sit <> stand time without UE support to 12 seconds or less to demo improved transfer efficiency.    Baseline 13.56 seconds; 13.35 seconds    Time 4    Period Weeks    Status Not Met      PT SHORT TERM GOAL #3   Title Pt will verbalize understanding of local Parkinson's disease resources, including options for continued community fitness.    Baseline provided handout' verbalzied understanding    Time 4    Period Weeks    Status Achieved               PT Long Term Goals - 08/23/20 0947       PT LONG TERM GOAL #1   Title Pt will be independent with PD specific HEP in order to build upon functional gains made in therapy. ALL LTGS DUE 10/18/20    Time 8    Period Weeks    Status New    Target Date 10/18/20      PT LONG TERM GOAL #2   Title Pt will recover posterior and anterior balance in push and release test in 1 robust step independently, for improved balance recovery    Baseline 2 smaller steps    Time 8    Period Weeks    Status New      PT LONG TERM GOAL #3   Title Pt will improve miniBEST score to at least a 25/28 in order to demo decr fall risk.    Baseline 22/28    Time 8    Period Weeks    Status New      PT LONG TERM GOAL #4   Title Pt will improve TUG and TUG  cog score to less than or equal to 10% difference for improved dual  task/decreased fall risk.    Baseline 9 seconds TUG, 12 seconds cog TUG    Time 8    Period Weeks    Status New                   Plan - 10/04/20 1207     Clinical Impression Statement Continued trial of single walking pole again this session, with pt having initial success with increased step length and posture for coordination of walking pole.  Therapist does need to provide verbal cues and occasional assistance for sequencing.  With increased speed/decreased step length with gait, he tends to get off sequence and asks what the purpose of the walking pole is.  He may benefit from continued trial, perhaps bilateral poles?, to see if that is helpful for stability and step length/posture with gait.    He will continue to benefit from skilled PT towards LTGs for imrpoved mobility and decreased fall risk.    Personal Factors and Comorbidities Comorbidity 2;Time since onset of injury/illness/exacerbation;Past/Current Experience    Comorbidities PD, h/o small bowel obstruction, HTN, Lt distal ICA aneurysm    Examination-Activity Limitations Locomotion Level;Bend;Squat;Transfers;Reach Overhead    Examination-Participation Restrictions Yard Work;Meal Prep;Shop;Community Activity;Interpersonal Relationship;Other   playing golf   Stability/Clinical Decision Making Stable/Uncomplicated    Rehab Potential Good    PT Frequency 2x / week   12 visits over 6 weeks   PT Duration 8 weeks   12 visits over 6 weeks   PT Treatment/Interventions ADLs/Self Care Home Management;Balance training;Vestibular;Gait training;Therapeutic exercise;Therapeutic activities;Neuromuscular re-education;Patient/family education;DME Instruction;Stair training;Functional mobility training    PT Next Visit Plan 10th Visit progress note next visit.  try standing PWR on compliant surface. Progress/add any other balance for home on unlevel surfaces. continue to  work on stepping strategies.working on B arm swing with gait. postural activities.  balance on compliant surfaces with head motion and with vision removed. continue to try walking stick (?or bilateral poles?) for outdoor gait for balance    Consulted and Agree with Plan of Care Patient             Patient will benefit from skilled therapeutic intervention in order to improve the following deficits and impairments:  Abnormal gait, Dizziness, Decreased balance, Postural dysfunction, Decreased strength, Difficulty walking  Visit Diagnosis: Other abnormalities of gait and mobility  Unsteadiness on feet  Abnormal posture     Problem List Patient Active Problem List   Diagnosis Date Noted   SBO (small bowel obstruction) ([place]) 07/06/2019    Karolee Meloni W. 10/04/2020, 12:11 PM Frazier Butt., PT   [facility] [address] [place], [place], [address] Phone: [phone]   Fax:  [phone]  Name: CAYNE YOM MRN: [id number] Date of Birth: 1950/10/26

## 2020-10-05 ENCOUNTER — Ambulatory Visit: Payer: Medicare Other

## 2020-10-05 DIAGNOSIS — R400 Somnolence: Secondary | ICD-10-CM

## 2020-10-05 DIAGNOSIS — R42 Dizziness and giddiness: Secondary | ICD-10-CM

## 2020-10-09 ENCOUNTER — Encounter: Payer: Self-pay | Admitting: Physical Therapy

## 2020-10-09 ENCOUNTER — Ambulatory Visit: Payer: Medicare Other | Admitting: Physical Therapy

## 2020-10-09 ENCOUNTER — Other Ambulatory Visit: Payer: Self-pay

## 2020-10-09 ENCOUNTER — Ambulatory Visit: Payer: Medicare Other | Admitting: Occupational Therapy

## 2020-10-09 DIAGNOSIS — R251 Tremor, unspecified: Secondary | ICD-10-CM

## 2020-10-09 DIAGNOSIS — R278 Other lack of coordination: Secondary | ICD-10-CM | POA: Diagnosis not present

## 2020-10-09 DIAGNOSIS — R2689 Other abnormalities of gait and mobility: Secondary | ICD-10-CM | POA: Diagnosis not present

## 2020-10-09 DIAGNOSIS — R293 Abnormal posture: Secondary | ICD-10-CM | POA: Diagnosis not present

## 2020-10-09 DIAGNOSIS — R29818 Other symptoms and signs involving the nervous system: Secondary | ICD-10-CM | POA: Diagnosis not present

## 2020-10-09 DIAGNOSIS — R2681 Unsteadiness on feet: Secondary | ICD-10-CM | POA: Diagnosis not present

## 2020-10-09 DIAGNOSIS — R29898 Other symptoms and signs involving the musculoskeletal system: Secondary | ICD-10-CM | POA: Diagnosis not present

## 2020-10-09 NOTE — Therapy (Addendum)
[facility] [address] [place], [place], [address] Phone: [phone]   Fax:  [phone]  Physical Therapy Treatment/10th Visit Progress Note  Patient Details  Name: Cody Dickerson MRN: [id number] Date of Birth: 1950-09-18 Referring Provider (PT): Dr. Carles Collet  10th Visit Physical Therapy Progress Note  Dates of Reporting Period: 08/23/20 to 10/09/20   Encounter Date: 10/09/2020   PT End of Session - 10/09/20 1059     Visit Number 10    Number of Visits 13    Date for PT Re-Evaluation 10/22/20    Authorization Type Medicare    PT Start Time 1015    PT Stop Time 1057    PT Time Calculation (min) 42 min    Activity Tolerance Patient tolerated treatment well    Behavior During Therapy [facility] for tasks assessed/performed             Past Medical History:  Diagnosis Date   Balance problem    Gout    Hypercholesterolemia    Hypertension    Small bowel obstruction [facility])     Past Surgical History:  Procedure Laterality Date   BOWEL BLOCKAGE  05/2019    There were no vitals filed for this visit.   Subjective Assessment - 10/09/20 1016     Subjective Doing well, no changes. Just still feeling imbalanced    Pertinent History PD (diagnosed december 2021), Lt distal ICA aneurysm, HTN, chronic dizziness and imbalance    Limitations Walking    Patient Stated Goals wants to get his balance back    Currently in Pain? No/denies                [facility] PT Assessment - 10/09/20 1028       Timed Up and Go Test   Normal TUG (seconds) 8.93    Cognitive TUG (seconds) 10.41                           [facility] Adult PT Treatment/Exercise - 10/09/20 1034       Ambulation/Gait   Ambulation/Gait Yes    Ambulation/Gait Assistance 4: Min guard    Ambulation/Gait Assistance Details attempted trial of bilat walking poles for potentially outdoor gait for balance and bigger movement patterns, demo and hand  over hand cues provided as well as cues to slow pace and use auditory cue of walking pole for placement. pt able to take a couple of steps before becoming off sequence    Ambulation Distance (Feet) 100 Feet   x1   Assistive device Other (Comment)   bilat walking poles   Gait Pattern Step-through pattern;Decreased arm swing - right;Decreased arm swing - left;Trunk flexed    Ambulation Surface Level;Indoor                 Balance Exercises - 10/09/20 1043       Balance Exercises: Standing   Standing Eyes Closed Narrow base of support (BOS);Foam/compliant surface    Standing Eyes Closed Limitations 3 x 30 seconds, feet hip width x10 reps head turns, x10 reps head nods    Stepping Strategy Anterior;Posterior;Foam/compliant surface;10 reps;UE support    Stepping Strategy Limitations Standing on Airex:  x10 reps B of each with cues for weight shift and foot clearance    Other Standing Exercises --    Other Standing Exercises Comments stepping stones for SLS and obstacle negotation down and back at countertop x5 reps beginning with  UE support and then none with min guard, cues to reset with tall PWR up posture with each stepping stone            Pt performs PWR! Moves in standing position (performed on blue air ex at edge mat with chair in front or at [place])   PWR! Up for improved posture x10 reps   PWR! Rock for improved weighshifting x10 reps B through hips, x10 reps with arm reach and cues to look up at hands  PWR! Step for improved step initiation 2 x 5 reps B at countertop, cues for foot clearance and reaching, using single UE support   Cues provided for technique and for posture intermittently.     PT Education - 10/09/20 1059     Education Details progress towards goals    Person(s) Educated Patient    Methods Explanation    Comprehension Verbalized understanding              PT Short Term Goals - 09/25/20 1025       PT SHORT TERM GOAL #1   Title Pt  will be independent with initial HEP in order to build upon functional gains made in therapy. ALL STGS DUE 09/20/20    Baseline require verbal cues for proper completion of PWR Up, completing daily per paitent reports    Time 4    Period Weeks    Status Partially Met    Target Date 09/20/20      PT SHORT TERM GOAL #2   Title Pt will decr 5x sit <> stand time without UE support to 12 seconds or less to demo improved transfer efficiency.    Baseline 13.56 seconds; 13.35 seconds    Time 4    Period Weeks    Status Not Met      PT SHORT TERM GOAL #3   Title Pt will verbalize understanding of local Parkinson's disease resources, including options for continued community fitness.    Baseline provided handout' verbalzied understanding    Time 4    Period Weeks    Status Achieved               PT Long Term Goals - 10/09/20 1025       PT LONG TERM GOAL #1   Title Pt will be independent with PD specific HEP in order to build upon functional gains made in therapy. ALL LTGS DUE 10/18/20    Time 8    Period Weeks    Status New      PT LONG TERM GOAL #2   Title Pt will recover posterior and anterior balance in push and release test in 1 robust step independently, for improved balance recovery    Baseline 2 smaller steps; 1 small step on 10/09/20    Time 8    Period Weeks    Status Partially Met      PT LONG TERM GOAL #3   Title Pt will improve miniBEST score to at least a 25/28 in order to demo decr fall risk.    Baseline 22/28    Time 8    Period Weeks    Status New      PT LONG TERM GOAL #4   Title Pt will improve TUG and TUG cog score to less than or equal to 10% difference for improved dual task/decreased fall risk.    Baseline 9 seconds TUG, 12 seconds cog TUG; on 10/09/20: 8.93 seconds TUG, 10.41 cog TUG  Time 8    Period Weeks    Status New                   Plan - 10/09/20 2116     Clinical Impression Statement 10th visit progress note: Began to check pt's  LTGs today. Pt partially met LTG #2, with push and release test in A/P directions at eval pt required 2 small steps to maintain his balance. Today pt performed with one smaller step vs. One robust step, but still able to maintain balance. Pt improved cog TUG score to 10.41 seconds (previously 12 seconds), but not to goal level of less than 10% difference with TUG. Attempted gait with bilat walking poles today vs single walking pole, pt only able to sequence for a couple steps. Cues for slowed pace and deliberate pole placement, but pt still with difficulty. REmainder of session focused on balance strategies on compliant surfaces. Will continue to progress towards LTGs.    Personal Factors and Comorbidities Comorbidity 2;Time since onset of injury/illness/exacerbation;Past/Current Experience    Comorbidities PD, h/o small bowel obstruction, HTN, Lt distal ICA aneurysm    Examination-Activity Limitations Locomotion Level;Bend;Squat;Transfers;Reach Overhead    Examination-Participation Restrictions Yard Work;Meal Prep;Shop;Community Activity;Interpersonal Relationship;Other   playing golf   Stability/Clinical Decision Making Stable/Uncomplicated    Rehab Potential Good    PT Frequency 2x / week   12 visits over 6 weeks   PT Duration 8 weeks   12 visits over 6 weeks   PT Treatment/Interventions ADLs/Self Care Home Management;Balance training;Vestibular;Gait training;Therapeutic exercise;Therapeutic activities;Neuromuscular re-education;Patient/family education;DME Instruction;Stair training;Functional mobility training    PT Next Visit Plan try standing PWR on compliant surface. Progress/add any other balance for home on unlevel surfaces. continue to work on stepping strategies.working on B arm swing with gait. postural activities.  balance on compliant surfaces with head motion and with vision removed.    Consulted and Agree with Plan of Care Patient             Patient will benefit from skilled  therapeutic intervention in order to improve the following deficits and impairments:  Abnormal gait, Dizziness, Decreased balance, Postural dysfunction, Decreased strength, Difficulty walking  Visit Diagnosis: Unsteadiness on feet  Other abnormalities of gait and mobility  Abnormal posture     Problem List Patient Active Problem List   Diagnosis Date Noted   SBO (small bowel obstruction) ([place]) 07/06/2019    Arliss Journey, PT, DPT  10/09/2020, 9:17 PM  [facility] [address] [place], [place], [address] Phone: [phone]   Fax:  [phone]  Name: THAILAN SAVA MRN: [id number] Date of Birth: Apr 09, 1950

## 2020-10-09 NOTE — Therapy (Signed)
[facility] [address] [place], [place], [address] Phone: [phone]   Fax:  [phone]  Occupational Therapy Treatment  Patient Details  Name: Cody Dickerson MRN: [id number] Date of Birth: 1950-12-10 Referring Provider (OT): Dr. Wells Guiles Tat   Encounter Date: 10/09/2020   OT End of Session - 10/09/20 1137     Visit Number 2    Number of Visits 17    Date for OT Re-Evaluation 11/30/20    Authorization - Visit Number 2    Authorization - Number of Visits 10    Progress Note Due on Visit 10    OT Start Time 1020    OT Stop Time 1055    OT Time Calculation (min) 35 min             Past Medical History:  Diagnosis Date   Balance problem    Gout    Hypercholesterolemia    Hypertension    Small bowel obstruction [facility])     Past Surgical History:  Procedure Laterality Date   BOWEL BLOCKAGE  05/2019    There were no vitals filed for this visit.   Subjective Assessment - 10/09/20 1136     Subjective  Denies pain    Pertinent History Parkinson's Disease.  PMH:  Hypercholesterolemia, HTN, hx of gout, hx small bowel blockage.    Currently in Pain? No/denies                         Treatment:Hanwriting activity for continuous "l", and writing strategies, pt. will benefit from review and handout. Pt performed PWR! hands basic 4, 5-10 reps each, pt will benefit from review prior to issuing , v.c to avoid shoulder hike.        OT Education - 10/09/20 1007     Education Details PWR! moves basic 4 10-20 reps each, min v.c and demonstration, coordination HEP issued, pt returned demonstration with v.c for amplitude    Person(s) Educated Patient    Methods Explanation;Demonstration;Verbal cues;Handout    Comprehension Verbalized understanding;Returned demonstration;Verbal cues required              OT Short Term Goals - 09/18/20 1447       OT SHORT TERM GOAL #1   Title Pt will be  independent with PD-specific HEP--check STGs 10/30/20    Time 4    Period Weeks    Status New      OT SHORT TERM GOAL #2   Title Pt will verbalize understanding of ways to decr risk of future complications related to PD.    Time 4    Period Weeks    Status New      OT SHORT TERM GOAL #3   Title Pt will report incr ease in using LUE to assist in washing hair.    Time 4    Period Weeks    Status New      OT SHORT TERM GOAL #4   Title Pt will don/doff jacket using large amplitude movement strategies (feet apart, associated trunk movements) for incr ease and safety.    Time 4    Period Weeks    Status New               OT Long Term Goals - 09/18/20 1451       OT LONG TERM GOAL #1   Title Pt will verbalize understanding of adaptive strategies for ADLs/IADLs to incr ease/safety, and  decr risk of future complications.--check LTGs 11/30/20    Time 8    Period Weeks    Status New      OT LONG TERM GOAL #2   Title Pt will verbalize understanding of memory compensation strategies and ways to keep thinking skills sharp.    Time 8    Period Weeks    Status New      OT LONG TERM GOAL #3   Title Pt will improve bilateral hand coordination and ability to dress as shown by fastening/unfastening 3 buttons in less than or equal to 40sec.    Baseline 57.12sec    Time 8    Period Weeks    Status New      OT LONG TERM GOAL #4   Title Pt improve functional reach and coordination as shown by improving box and block score by at least 3 blocks bilateral.    Baseline R-44 blocks, L-47 blocks    Time 8    Period Weeks    Status New      OT LONG TERM GOAL #5   Title Pt will verbalize understanding of PD related community resources.    Time 8    Period Weeks    Status New                   Plan - 10/09/20 1143     Clinical Impression Statement Pt is progressing towards goals. Pt demonstrates understanding of initial HEP for coordination and PWR! seated.    OT  Occupational Profile and History Detailed Assessment- Review of Records and additional review of physical, cognitive, psychosocial history related to current functional performance    Occupational performance deficits (Please refer to evaluation for details): ADL's;IADL's;Work;Leisure    Body Structure / Function / Physical Skills ADL;Balance;Decreased knowledge of use of DME;Tone;GMC;Dexterity;UE functional use;IADL;FMC;Coordination;Mobility;Improper spinal/pelvic alignment;Body mechanics;Proprioception    Cognitive Skills Attention;Memory    Rehab Potential Good    Clinical Decision Making Several treatment options, min-mod task modification necessary    Comorbidities Affecting Occupational Performance: May have comorbidities impacting occupational performance    Modification or Assistance to Complete Evaluation  Min-Moderate modification of tasks or assist with assess necessary to complete eval    OT Frequency 2x / week    OT Duration 8 weeks   +eval (delayed start in scheduling), may only need 6 weeks depending on progress   OT Treatment/Interventions Self-care/ADL training;Moist Heat;DME and/or AE instruction;Balance training;Therapeutic activities;Aquatic Therapy;Cognitive remediation/compensation;Therapeutic exercise;Cryotherapy;Energy conservation;Manual Therapy;Patient/family education;Passive range of motion;Functional Mobility Training;Neuromuscular education    Plan review PWR ! seated, eduction regarding handwriting - issue handout, ADL strategies    Consulted and Agree with Plan of Care Patient             Patient will benefit from skilled therapeutic intervention in order to improve the following deficits and impairments:   Body Structure / Function / Physical Skills: ADL, Balance, Decreased knowledge of use of DME, Tone, GMC, Dexterity, UE functional use, IADL, FMC, Coordination, Mobility, Improper spinal/pelvic alignment, Body mechanics, Proprioception Cognitive Skills:  Attention, Memory     Visit Diagnosis: Other lack of coordination  Other symptoms and signs involving the musculoskeletal system  Other symptoms and signs involving the nervous system  Tremor  Abnormal posture    Problem List Patient Active Problem List   Diagnosis Date Noted   SBO (small bowel obstruction) ([place]) 07/06/2019    Magnum Lunde 10/09/2020, 11:46 AM  [facility]  [place], [place], [address] Phone: [phone]   Fax:  [phone]  Name: Cody Dickerson MRN: [id number] Date of Birth: 11/17/1950

## 2020-10-09 NOTE — Patient Instructions (Signed)
Coordination Exercises  Perform the following exercises for 20 minutes 1 times per day. Perform with both hand(s). Perform using big movements.  Flipping Cards: Place deck of cards on the table. Flip cards over by opening your hand big to grasp and then turn your palm up big. Deal cards: Hold 1/2 or whole deck in your hand. Use thumb to push card off top of deck with one big push. Rotate ball with fingertips: Pick up with fingers/thumb and move as much as you can with each turn/movement (clockwise and counter-clockwise). Toss ball from one hand to the other: Toss big/high. Toss ball in the air and catch with the same hand: Toss big/high. Pick up coins and stack one at a time: Pick up with big, intentional movements. Do not drag coin to the edge. (5-10 in a stack) Pick up 5-10 coins one at a time and hold in palm. Then, move coins from palm to fingertips one at a time to stack. Practice writing: Slow down, write big, and focus on forming each letter. Perform "Flicks"/hand stretches (PWR! Hands): Close hands then flick out your fingers with focus on opening hands, pulling wrists back, and extending elbows like you are pushing.

## 2020-10-11 ENCOUNTER — Ambulatory Visit: Payer: Medicare Other | Admitting: Physical Therapy

## 2020-10-11 ENCOUNTER — Encounter: Payer: Self-pay | Admitting: Occupational Therapy

## 2020-10-11 ENCOUNTER — Other Ambulatory Visit: Payer: Self-pay

## 2020-10-11 ENCOUNTER — Encounter: Payer: Self-pay | Admitting: Physical Therapy

## 2020-10-11 ENCOUNTER — Ambulatory Visit: Payer: Medicare Other | Admitting: Occupational Therapy

## 2020-10-11 DIAGNOSIS — R278 Other lack of coordination: Secondary | ICD-10-CM | POA: Diagnosis not present

## 2020-10-11 DIAGNOSIS — R293 Abnormal posture: Secondary | ICD-10-CM

## 2020-10-11 DIAGNOSIS — R29898 Other symptoms and signs involving the musculoskeletal system: Secondary | ICD-10-CM | POA: Diagnosis not present

## 2020-10-11 DIAGNOSIS — R41840 Attention and concentration deficit: Secondary | ICD-10-CM

## 2020-10-11 DIAGNOSIS — R2681 Unsteadiness on feet: Secondary | ICD-10-CM

## 2020-10-11 DIAGNOSIS — R29818 Other symptoms and signs involving the nervous system: Secondary | ICD-10-CM

## 2020-10-11 DIAGNOSIS — R251 Tremor, unspecified: Secondary | ICD-10-CM

## 2020-10-11 DIAGNOSIS — R2689 Other abnormalities of gait and mobility: Secondary | ICD-10-CM | POA: Diagnosis not present

## 2020-10-11 NOTE — Therapy (Addendum)
[facility] [address] [place], [place], [address] Phone: [phone]   Fax:  [phone]  Physical Therapy Treatment  Patient Details  Name: ETHYN SCHETTER MRN: [id number] Date of Birth: [birth year] Referring Provider (PT): Dr. Carles Collet   Encounter Date: 10/11/2020   PT End of Session - 10/11/20 1058     Visit Number 11    Number of Visits 13    Date for PT Re-Evaluation 10/22/20    Authorization Type Medicare    PT Start Time 7494    PT Stop Time 1057    PT Time Calculation (min) 42 min    Activity Tolerance Patient tolerated treatment well    Behavior During Therapy [facility] for tasks assessed/performed             Past Medical History:  Diagnosis Date   Balance problem    Gout    Hypercholesterolemia    Hypertension    Small bowel obstruction [facility])     Past Surgical History:  Procedure Laterality Date   BOWEL BLOCKAGE  05/2019    There were no vitals filed for this visit.   Subjective Assessment - 10/11/20 1017     Subjective Doing well, still feeling the same.    Pertinent History PD (diagnosed december 2021), Lt distal ICA aneurysm, HTN, chronic dizziness and imbalance    Limitations Walking    Patient Stated Goals wants to get his balance back    Currently in Pain? No/denies                               [facility] Adult PT Treatment/Exercise - 10/11/20 1320       Ambulation/Gait   Ambulation/Gait Yes    Ambulation/Gait Assistance 5: Supervision    Ambulation/Gait Assistance Details cues throughout session for posture, step length, arm swing    Gait Pattern Step-through pattern;Decreased arm swing - right;Decreased arm swing - left;Trunk flexed    Ambulation Surface Level;Indoor      Therapeutic Activites    Therapeutic Activities Other Therapeutic Activities    Other Therapeutic Activities discussed POC going forwards with LTGs due next week with 2 remaining appts after  today, pt reporting that he will be ready for D/C at this time and will continue with his exercise program, walking, and PD cycle program. discussed possibility of PWR moves exercise class, with pt to check his schedule and reach out to therapist to see if he can attend. discussed return eval in 6-9 mo with pt in agreement            NMR:  Performed standing PWR Flow: Up > Rock > Twist > Step x5 reps  Cues for wider BOS throughout and therapist performing in front of pt for demo and verbal cues.    Sit to stand > PWR UP > stand to sit x10 reps on blue air ex    Balance Exercises - 10/11/20 1037       Balance Exercises: Standing   Standing Eyes Closed Foam/compliant surface   on blue air ex   Standing Eyes Closed Limitations with feet hip width distance 2 x 10 reps head turns, 2 x 10 reps head nods, intermittent taps to bars    Wall Bumps Hip;10 reps;Limitations    Wall Bumps Limitations on blue air ex, cues for technique    Stepping Strategy Anterior;Posterior;Foam/compliant surface;Limitations;10 reps    Stepping Strategy Limitations on red  balance beam, alternating, cues for step length and weight shift, using UE support as needed    Other Standing Exercises blue air ex: x10 reps heel toe raises with BUE support, wide BOS trunk rotations reaching to target in corner - laterally and superiorly laterally, cues to pivot on feet    Other Standing Exercises Comments providing perturbations at pt's pelvis with blue tband to ellicit step strategy in posterior direction, x10 reps with taking single robust step               PT Education - 10/11/20 1058     Education Details see TA    Person(s) Educated Patient    Methods Explanation    Comprehension Verbalized understanding              PT Short Term Goals - 09/25/20 1025       PT SHORT TERM GOAL #1   Title Pt will be independent with initial HEP in order to build upon functional gains made in therapy. ALL STGS DUE  09/20/20    Baseline require verbal cues for proper completion of PWR Up, completing daily per paitent reports    Time 4    Period Weeks    Status Partially Met    Target Date 09/20/20      PT SHORT TERM GOAL #2   Title Pt will decr 5x sit <> stand time without UE support to 12 seconds or less to demo improved transfer efficiency.    Baseline 13.56 seconds; 13.35 seconds    Time 4    Period Weeks    Status Not Met      PT SHORT TERM GOAL #3   Title Pt will verbalize understanding of local Parkinson's disease resources, including options for continued community fitness.    Baseline provided handout' verbalzied understanding    Time 4    Period Weeks    Status Achieved               PT Long Term Goals - 10/09/20 1025       PT LONG TERM GOAL #1   Title Pt will be independent with PD specific HEP in order to build upon functional gains made in therapy. ALL LTGS DUE 10/18/20    Time 8    Period Weeks    Status New      PT LONG TERM GOAL #2   Title Pt will recover posterior and anterior balance in push and release test in 1 robust step independently, for improved balance recovery    Baseline 2 smaller steps; 1 small step on 10/09/20    Time 8    Period Weeks    Status Partially Met      PT LONG TERM GOAL #3   Title Pt will improve miniBEST score to at least a 25/28 in order to demo decr fall risk.    Baseline 22/28    Time 8    Period Weeks    Status New      PT LONG TERM GOAL #4   Title Pt will improve TUG and TUG cog score to less than or equal to 10% difference for improved dual task/decreased fall risk.    Baseline 9 seconds TUG, 12 seconds cog TUG; on 10/09/20: 8.93 seconds TUG, 10.41 cog TUG    Time 8    Period Weeks    Status New  Plan - 10/11/20 1334     Clinical Impression Statement Attempted PWR Flow today with pt needing verbal and demo cues from therapist for proper technique, esp with beginning with wider BOS. Remainder of  session focused on balance strategies on compliant surfaces working with vision removed, stepping/hip/ankle strategies. Pt tolerated session well, will continue to progress towards LTGs.    Personal Factors and Comorbidities Comorbidity 2;Time since onset of injury/illness/exacerbation;Past/Current Experience    Comorbidities PD, h/o small bowel obstruction, HTN, Lt distal ICA aneurysm    Examination-Activity Limitations Locomotion Level;Bend;Squat;Transfers;Reach Overhead    Examination-Participation Restrictions Yard Work;Meal Prep;Shop;Community Activity;Interpersonal Relationship;Other   playing golf   Stability/Clinical Decision Making Stable/Uncomplicated    Rehab Potential Good    PT Frequency 2x / week   12 visits over 6 weeks   PT Duration 8 weeks   12 visits over 6 weeks   PT Treatment/Interventions ADLs/Self Care Home Management;Balance training;Vestibular;Gait training;Therapeutic exercise;Therapeutic activities;Neuromuscular re-education;Patient/family education;DME Instruction;Stair training;Functional mobility training    PT Next Visit Plan LTGs due this week. try standing PWR on compliant surface and PWR flow again.  Progress/add any other balance for home on unlevel surfaces. continue to work on stepping strategies.working on B arm swing with gait. postural activities.  balance on compliant surfaces with head motion and with vision removed.    Consulted and Agree with Plan of Care Patient             Patient will benefit from skilled therapeutic intervention in order to improve the following deficits and impairments:  Abnormal gait, Dizziness, Decreased balance, Postural dysfunction, Decreased strength, Difficulty walking  Visit Diagnosis: Other symptoms and signs involving the nervous system  Abnormal posture  Unsteadiness on feet     Problem List Patient Active Problem List   Diagnosis Date Noted   SBO (small bowel obstruction) ([place]) 07/06/2019    Arliss Journey, PT, DPT  10/11/2020, 1:36 PM  [facility] [address] [place], [place], [address] Phone: [phone]   Fax:  [phone]  Name: KIJUAN GALLICCHIO MRN: [id number] Date of Birth: 02/07/1951

## 2020-10-11 NOTE — Patient Instructions (Signed)
   PWR! Hand Exercises  Then, start with elbows bent and hands closed:  PWR! Hands: Push hands out BIG. Elbows straight, wrists up, fingers open and spread apart BIG.   PWR! Step: Touch index finger to thumb while keeping other fingers straight. Flick fingers out BIG (thumb out/straighten fingers). Repeat with other fingers. (Step your thumb to each finger).   With arms stretched out in front of you (elbows straight), perform the following:  PWR! Rock:  Move wrists up and down [employer]! Twist: Twist palms up and down BIG    ** Make each movement big and deliberate so that you feel the movement.  Perform at least 10 repetitions 1x/day, but perform PWR! Hands throughout the day when you are having trouble using your hands (picking up/manipulating small objects, writing, eating, typing, sewing, buttoning, etc.).

## 2020-10-11 NOTE — Therapy (Signed)
[facility] [address] [place], [place], [address] Phone: [phone]   Fax:  [phone]  Occupational Therapy Treatment  Patient Details  Name: ABDULLAHI CARTEN MRN: [id number] Date of Birth: [birth year] Referring Provider (OT): Dr. Wells Guiles Tat   Encounter Date: 10/11/2020   OT End of Session - 10/11/20 1002     Visit Number 3    Number of Visits 17    Date for OT Re-Evaluation 11/30/20    Authorization Type Medicare & AARP, covered at 100%    Authorization - Visit Number 3    Authorization - Number of Visits 10    Progress Note Due on Visit 10    OT Start Time [id number]    OT Stop Time 1015    OT Time Calculation (min) 39 min    Activity Tolerance Patient tolerated treatment well    Behavior During Therapy [facility] for tasks assessed/performed             Past Medical History:  Diagnosis Date   Balance problem    Gout    Hypercholesterolemia    Hypertension    Small bowel obstruction [facility])     Past Surgical History:  Procedure Laterality Date   BOWEL BLOCKAGE  05/2019    There were no vitals filed for this visit.   Subjective Assessment - 10/11/20 0938     Subjective  haven't done coordination HEP yet (gathreing items)    Pertinent History Parkinson's Disease.  PMH:  Hypercholesterolemia, HTN, hx of gout, hx small bowel blockage.    Patient Stated Goals improve writing, improve ADLs    Currently in Pain? No/denies               Reviewed card (flipping and dealing cards with thumb) and coin activities (stacking and manipulating in hand) from coordination HEP with each hand.  Pt returned demo with min cueing for large amplitude movements.        OT Education - 10/11/20 1000     Education Details PWR! moves (basic 4) in sitting (and issued handout); PWR! hands (basic 4)--see pt instructions    Person(s) Educated Patient    Methods Explanation;Demonstration;Verbal cues;Handout     Comprehension Verbalized understanding;Returned demonstration;Verbal cues required              OT Short Term Goals - 09/18/20 1447       OT SHORT TERM GOAL #1   Title Pt will be independent with PD-specific HEP--check STGs 10/30/20    Time 4    Period Weeks    Status New      OT SHORT TERM GOAL #2   Title Pt will verbalize understanding of ways to decr risk of future complications related to PD.    Time 4    Period Weeks    Status New      OT SHORT TERM GOAL #3   Title Pt will report incr ease in using LUE to assist in washing hair.    Time 4    Period Weeks    Status New      OT SHORT TERM GOAL #4   Title Pt will don/doff jacket using large amplitude movement strategies (feet apart, associated trunk movements) for incr ease and safety.    Time 4    Period Weeks    Status New               OT Long Term Goals - 09/18/20 1451  OT LONG TERM GOAL #1   Title Pt will verbalize understanding of adaptive strategies for ADLs/IADLs to incr ease/safety, and decr risk of future complications.--check LTGs 11/30/20    Time 8    Period Weeks    Status New      OT LONG TERM GOAL #2   Title Pt will verbalize understanding of memory compensation strategies and ways to keep thinking skills sharp.    Time 8    Period Weeks    Status New      OT LONG TERM GOAL #3   Title Pt will improve bilateral hand coordination and ability to dress as shown by fastening/unfastening 3 buttons in less than or equal to 40sec.    Baseline 57.12sec    Time 8    Period Weeks    Status New      OT LONG TERM GOAL #4   Title Pt improve functional reach and coordination as shown by improving box and block score by at least 3 blocks bilateral.    Baseline R-44 blocks, L-47 blocks    Time 8    Period Weeks    Status New      OT LONG TERM GOAL #5   Title Pt will verbalize understanding of PD related community resources.    Time 8    Period Weeks    Status New                    Plan - 10/11/20 1002     Clinical Impression Statement Pt is progressing towards goals. Pt demonstrates understanding of initial HEP (PWR! seated and PWR! hands).  Pt responds well to cueing for incr movement amplitude.    OT Occupational Profile and History Detailed Assessment- Review of Records and additional review of physical, cognitive, psychosocial history related to current functional performance    Occupational performance deficits (Please refer to evaluation for details): ADL's;IADL's;Work;Leisure    Body Structure / Function / Physical Skills ADL;Balance;Decreased knowledge of use of DME;Tone;GMC;Dexterity;UE functional use;IADL;FMC;Coordination;Mobility;Improper spinal/pelvic alignment;Body mechanics;Proprioception    Cognitive Skills Attention;Memory    Rehab Potential Good    Clinical Decision Making Several treatment options, min-mod task modification necessary    Comorbidities Affecting Occupational Performance: May have comorbidities impacting occupational performance    Modification or Assistance to Complete Evaluation  Min-Moderate modification of tasks or assist with assess necessary to complete eval    OT Frequency 2x / week    OT Duration 8 weeks   +eval (delayed start in scheduling), may only need 6 weeks depending on progress   OT Treatment/Interventions Self-care/ADL training;Moist Heat;DME and/or AE instruction;Balance training;Therapeutic activities;Aquatic Therapy;Cognitive remediation/compensation;Therapeutic exercise;Cryotherapy;Energy conservation;Manual Therapy;Patient/family education;Passive range of motion;Functional Mobility Training;Neuromuscular education    Plan eduction regarding handwriting - issue handout, ADL strategies, review HEPs prn    OT Home Exercise Plan educated provided:  PWR! seated; PWR! hands; coordination HEP    Consulted and Agree with Plan of Care Patient             Patient will benefit from skilled therapeutic  intervention in order to improve the following deficits and impairments:   Body Structure / Function / Physical Skills: ADL, Balance, Decreased knowledge of use of DME, Tone, GMC, Dexterity, UE functional use, IADL, FMC, Coordination, Mobility, Improper spinal/pelvic alignment, Body mechanics, Proprioception Cognitive Skills: Attention, Memory     Visit Diagnosis: Other lack of coordination  Other symptoms and signs involving the musculoskeletal system  Other symptoms and signs involving the nervous  system  Tremor  Abnormal posture  Unsteadiness on feet  Other abnormalities of gait and mobility  Attention and concentration deficit    Problem List Patient Active Problem List   Diagnosis Date Noted   SBO (small bowel obstruction) ([place]) 07/06/2019    [facility] 10/11/2020, 12:04 PM  [facility] [address] [place], [place], [address] Phone: [phone]   Fax:  [phone]  Name: DAYCEN KUNDERT MRN: [id number] Date of Birth: [birth year]  Vianne Bulls, OTR/L [facility] [address] [place], [place]  [address] [phone] phone [phone] 10/11/20 12:04 PM

## 2020-10-15 ENCOUNTER — Telehealth: Payer: Self-pay | Admitting: Cardiology

## 2020-10-15 NOTE — Telephone Encounter (Signed)
Sent my chart message Stop all BP medications. Low BP could be due to you not having pain since surgery. Happy to evaluate you in the office. Respond to this.

## 2020-10-15 NOTE — Telephone Encounter (Signed)
Pt calling requesting a call from [place] regarding low BP

## 2020-10-16 ENCOUNTER — Ambulatory Visit: Payer: Medicare Other | Admitting: Physical Therapy

## 2020-10-16 ENCOUNTER — Ambulatory Visit: Payer: Medicare Other | Admitting: Occupational Therapy

## 2020-10-16 ENCOUNTER — Encounter: Payer: Self-pay | Admitting: Occupational Therapy

## 2020-10-16 ENCOUNTER — Other Ambulatory Visit: Payer: Self-pay

## 2020-10-16 ENCOUNTER — Encounter: Payer: Self-pay | Admitting: Physical Therapy

## 2020-10-16 DIAGNOSIS — R29898 Other symptoms and signs involving the musculoskeletal system: Secondary | ICD-10-CM

## 2020-10-16 DIAGNOSIS — R251 Tremor, unspecified: Secondary | ICD-10-CM

## 2020-10-16 DIAGNOSIS — R29818 Other symptoms and signs involving the nervous system: Secondary | ICD-10-CM | POA: Diagnosis not present

## 2020-10-16 DIAGNOSIS — R278 Other lack of coordination: Secondary | ICD-10-CM | POA: Diagnosis not present

## 2020-10-16 DIAGNOSIS — R293 Abnormal posture: Secondary | ICD-10-CM

## 2020-10-16 DIAGNOSIS — R2689 Other abnormalities of gait and mobility: Secondary | ICD-10-CM | POA: Diagnosis not present

## 2020-10-16 DIAGNOSIS — H5203 Hypermetropia, bilateral: Secondary | ICD-10-CM | POA: Diagnosis not present

## 2020-10-16 DIAGNOSIS — H25013 Cortical age-related cataract, bilateral: Secondary | ICD-10-CM | POA: Diagnosis not present

## 2020-10-16 DIAGNOSIS — R2681 Unsteadiness on feet: Secondary | ICD-10-CM

## 2020-10-16 NOTE — Therapy (Signed)
[facility] [address] [place], [place], [address] Phone: [phone]   Fax:  [phone]  Occupational Therapy Treatment  Patient Details  Name: Cody Dickerson MRN: [id number] Date of Birth: [birth year] Referring Provider (OT): Dr. Wells Guiles Tat   Encounter Date: 10/16/2020   OT End of Session - 10/16/20 1426     Visit Number 4    Number of Visits 17    Date for OT Re-Evaluation 11/30/20    Authorization Type Medicare & AARP, covered at 100%    Authorization - Visit Number 4    Authorization - Number of Visits 10    Progress Note Due on Visit 10    OT Start Time 1404    OT Stop Time 1444    OT Time Calculation (min) 40 min    Activity Tolerance Patient tolerated treatment well    Behavior During Therapy [facility] for tasks assessed/performed             Past Medical History:  Diagnosis Date   Balance problem    Gout    Hypercholesterolemia    Hypertension    Small bowel obstruction [facility])     Past Surgical History:  Procedure Laterality Date   BOWEL BLOCKAGE  05/2019    There were no vitals filed for this visit.   Subjective Assessment - 10/16/20 1405     Subjective  Deneis pain    Pertinent History Parkinson's Disease.  PMH:  Hypercholesterolemia, HTN, hx of gout, hx small bowel blockage.    Patient Stated Goals improve writing, improve ADLs    Currently in Pain? No/denies                     Treatment: Pt simulated placing pills in pillbox with good performance, no drops.   Grooved pegs for increased fine motor coordination and in hand manipulation, left and right UE's, min difficulty/ v.c  Arm bike x 5 mins level 1 for conditioning, pt maintained 40 rpm             OT Education - 10/16/20 1430     Education Details Reviewed PWR! moves basic 4, 10 reps each, min v.c, Handwriting strategies with handout issued, pt wrote 3 checks with good legibility and minimal decrease in  letter size.    Person(s) Educated Patient    Methods Explanation;Demonstration;Verbal cues;Handout    Comprehension Verbalized understanding;Returned demonstration;Verbal cues required              OT Short Term Goals - 10/16/20 1427       OT SHORT TERM GOAL #1   Title Pt will be independent with PD-specific HEP--check STGs 10/30/20    Time 4    Period Weeks    Status On-going      OT SHORT TERM GOAL #2   Title Pt will verbalize understanding of ways to decr risk of future complications related to PD.    Time 4    Period Weeks    Status On-going      OT SHORT TERM GOAL #3   Title Pt will report incr ease in using LUE to assist in washing hair.    Time 4    Period Weeks    Status On-going      OT SHORT TERM GOAL #4   Title Pt will don/doff jacket using large amplitude movement strategies (feet apart, associated trunk movements) for incr ease and safety.    Time 4  Period Weeks    Status New               OT Long Term Goals - 09/18/20 1451       OT LONG TERM GOAL #1   Title Pt will verbalize understanding of adaptive strategies for ADLs/IADLs to incr ease/safety, and decr risk of future complications.--check LTGs 11/30/20    Time 8    Period Weeks    Status New      OT LONG TERM GOAL #2   Title Pt will verbalize understanding of memory compensation strategies and ways to keep thinking skills sharp.    Time 8    Period Weeks    Status New      OT LONG TERM GOAL #3   Title Pt will improve bilateral hand coordination and ability to dress as shown by fastening/unfastening 3 buttons in less than or equal to 40sec.    Baseline 57.12sec    Time 8    Period Weeks    Status New      OT LONG TERM GOAL #4   Title Pt improve functional reach and coordination as shown by improving box and block score by at least 3 blocks bilateral.    Baseline R-44 blocks, L-47 blocks    Time 8    Period Weeks    Status New      OT LONG TERM GOAL #5   Title Pt will  verbalize understanding of PD related community resources.    Time 8    Period Weeks    Status New                   Plan - 10/16/20 1426     Clinical Impression Statement Pt is progressing towards goals. Pt demonstrates improved handwriting legibility    OT Occupational Profile and History Detailed Assessment- Review of Records and additional review of physical, cognitive, psychosocial history related to current functional performance    Occupational performance deficits (Please refer to evaluation for details): ADL's;IADL's;Work;Leisure    Body Structure / Function / Physical Skills ADL;Balance;Decreased knowledge of use of DME;Tone;GMC;Dexterity;UE functional use;IADL;FMC;Coordination;Mobility;Improper spinal/pelvic alignment;Body mechanics;Proprioception    Cognitive Skills Attention;Memory    Rehab Potential Good    Clinical Decision Making Several treatment options, min-mod task modification necessary    Comorbidities Affecting Occupational Performance: May have comorbidities impacting occupational performance    Modification or Assistance to Complete Evaluation  Min-Moderate modification of tasks or assist with assess necessary to complete eval    OT Frequency 2x / week    OT Duration 8 weeks   +eval (delayed start in scheduling), may only need 6 weeks depending on progress   OT Treatment/Interventions Self-care/ADL training;Moist Heat;DME and/or AE instruction;Balance training;Therapeutic activities;Aquatic Therapy;Cognitive remediation/compensation;Therapeutic exercise;Cryotherapy;Energy conservation;Manual Therapy;Patient/family education;Passive range of motion;Functional Mobility Training;Neuromuscular education    Plan eduction regarding handwriting - issue handout, ADL strategies, review HEPs prn    OT Home Exercise Plan educated provided:  PWR! seated; PWR! hands; coordination HEP    Consulted and Agree with Plan of Care Patient             Patient will benefit  from skilled therapeutic intervention in order to improve the following deficits and impairments:   Body Structure / Function / Physical Skills: ADL, Balance, Decreased knowledge of use of DME, Tone, GMC, Dexterity, UE functional use, IADL, FMC, Coordination, Mobility, Improper spinal/pelvic alignment, Body mechanics, Proprioception Cognitive Skills: Attention, Memory     Visit Diagnosis: Other lack of  coordination  Other symptoms and signs involving the musculoskeletal system  Other symptoms and signs involving the nervous system  Abnormal posture  Tremor  Other abnormalities of gait and mobility    Problem List Patient Active Problem List   Diagnosis Date Noted   SBO (small bowel obstruction) ([place]) 07/06/2019    Makayela Secrest 10/16/2020, 2:36 PM  [facility] [address] [place], [place], [address] Phone: [phone]   Fax:  [phone]  Name: Cody Dickerson MRN: [id number] Date of Birth: 1950-03-12

## 2020-10-16 NOTE — Therapy (Signed)
[facility] [address] [place], [place], [address] Phone: [phone]   Fax:  [phone]  Physical Therapy Treatment  Patient Details  Name: Cody Dickerson MRN: [id number] Date of Birth: 1951-02-03 Referring Provider (PT): Dr. Carles Collet   Encounter Date: 10/16/2020   PT End of Session - 10/16/20 1315     Visit Number 12    Number of Visits 13    Date for PT Re-Evaluation 10/22/20    Authorization Type Medicare    PT Start Time 5027    PT Stop Time 1358    PT Time Calculation (min) 41 min    Equipment Utilized During Treatment Gait belt    Activity Tolerance Patient tolerated treatment well    Behavior During Therapy [facility] for tasks assessed/performed             Past Medical History:  Diagnosis Date   Balance problem    Gout    Hypercholesterolemia    Hypertension    Small bowel obstruction [facility])     Past Surgical History:  Procedure Laterality Date   BOWEL BLOCKAGE  05/2019    There were no vitals filed for this visit.   Subjective Assessment - 10/16/20 1315     Subjective Had eye exam today and got eyes dilated.  Everything is okay.    Pertinent History PD (diagnosed december 2021), Lt distal ICA aneurysm, HTN, chronic dizziness and imbalance    Limitations Walking    Patient Stated Goals wants to get his balance back    Currently in Pain? No/denies                               [facility] Adult PT Treatment/Exercise - 10/16/20 0001       Transfers   Transfers Sit to Stand    Sit to Stand 6: Modified independent (Device/Increase time);Without upper extremity assist;From chair/3-in-1    Five time sit to stand comments  12.81    Stand to Sit 6: Modified independent (Device/Increase time);Without upper extremity assist      Ambulation/Gait   Ambulation/Gait Yes    Ambulation/Gait Assistance 5: Supervision    Ambulation Distance (Feet) 800 Feet    Assistive device None    Gait  Pattern Step-through pattern;Decreased arm swing - right;Decreased arm swing - left;Trunk flexed    Ambulation Surface Level;Indoor    Gait velocity 7.88 sec = 4.16 ft/sec    Gait Comments Gait with cognitive/conversation task, slightly slowed pattern with cues for posture.      Standardized Balance Assessment   Standardized Balance Assessment Timed Up and Go Test      Timed Up and Go Test   TUG Normal TUG;Cognitive TUG    Normal TUG (seconds) 9.75    Cognitive TUG (seconds) 9.84      Therapeutic Activites    Therapeutic Activities Other Therapeutic Activities    Other Therapeutic Activities Reviewed/discussed information about PWR! Moves community exercise class and provided handouts.  Discussed other positions of PWR! Moves-quadruped, supine, prone (as pt has done sitting and standing) and he is interested in trying the class.                 Balance Exercises - 10/16/20 0001       Balance Exercises: Standing   Other Standing Exercises Standing PWR! Moves performed on [employer], x 5 reps each (5 reps each direction) and then PWR! Moves FLOW  in standing on [employer], x 5 reps each with verbal cues for widened BOS and for foot clearance.    Other Standing Exercises Comments Varied direction stepping, standing on solid ground, then blue balance beam, with PT calling out varied foot and directions.  PT provides cues for increased weigthshift to posterior direciton.  Progressed to multi-direction stepping, with calling out colors to increase cognitive demand for dual tasking.  Pt stnaidng on blue balance beam, with supervision and cues for foot clearance, step length.               PT Education - 10/16/20 1752     Education Details handout for PWR! Moves exercise class details    Person(s) Educated Patient    Methods Explanation;Handout    Comprehension Verbalized understanding              PT Short Term Goals - 09/25/20 1025       PT SHORT TERM GOAL #1   Title Pt will  be independent with initial HEP in order to build upon functional gains made in therapy. ALL STGS DUE 09/20/20    Baseline require verbal cues for proper completion of PWR Up, completing daily per paitent reports    Time 4    Period Weeks    Status Partially Met    Target Date 09/20/20      PT SHORT TERM GOAL #2   Title Pt will decr 5x sit <> stand time without UE support to 12 seconds or less to demo improved transfer efficiency.    Baseline 13.56 seconds; 13.35 seconds    Time 4    Period Weeks    Status Not Met      PT SHORT TERM GOAL #3   Title Pt will verbalize understanding of local Parkinson's disease resources, including options for continued community fitness.    Baseline provided handout' verbalzied understanding    Time 4    Period Weeks    Status Achieved               PT Long Term Goals - 10/16/20 1356       PT LONG TERM GOAL #1   Title Pt will be independent with PD specific HEP in order to build upon functional gains made in therapy. ALL LTGS DUE 10/18/20    Time 8    Period Weeks    Status New      PT LONG TERM GOAL #2   Title Pt will recover posterior and anterior balance in push and release test in 1 robust step independently, for improved balance recovery    Baseline 2 smaller steps; 1 small step on 10/09/20    Time 8    Period Weeks    Status Partially Met      PT LONG TERM GOAL #3   Title Pt will improve miniBEST score to at least a 25/28 in order to demo decr fall risk.    Baseline 22/28    Time 8    Period Weeks    Status New      PT LONG TERM GOAL #4   Title Pt will improve TUG and TUG cog score to less than or equal to 10% difference for improved dual task/decreased fall risk.    Baseline 9 seconds TUG, 12 seconds cog TUG; on 10/09/20: 8.93 seconds TUG, 10.41 cog TUG; TUG 9.72/cognitive 9.84 sec    Time 8    Period Weeks    Status Achieved  Plan - 10/16/20 1753     Clinical Impression Statement Began assessing  LTGs this visit, with pt meeting LTG 4 for improved TUG/TUG cognitive score.  Continued work on varied direction stepping, with continued hesitancy in posterior direction, with improvement noted with repetition through session.  Pt is progressing towards goals, anticipate plans for d/c next visit.    Personal Factors and Comorbidities Comorbidity 2;Time since onset of injury/illness/exacerbation;Past/Current Experience    Comorbidities PD, h/o small bowel obstruction, HTN, Lt distal ICA aneurysm    Examination-Activity Limitations Locomotion Level;Bend;Squat;Transfers;Reach Overhead    Examination-Participation Restrictions Yard Work;Meal Prep;Shop;Community Activity;Interpersonal Relationship;Other   playing golf   Stability/Clinical Decision Making Stable/Uncomplicated    Rehab Potential Good    PT Frequency 2x / week   12 visits over 6 weeks   PT Duration 8 weeks   12 visits over 6 weeks   PT Treatment/Interventions ADLs/Self Care Home Management;Balance training;Vestibular;Gait training;Therapeutic exercise;Therapeutic activities;Neuromuscular re-education;Patient/family education;DME Instruction;Stair training;Functional mobility training    PT Next Visit Plan LTGs due this week. Plan for d/c next visit.    Consulted and Agree with Plan of Care Patient             Patient will benefit from skilled therapeutic intervention in order to improve the following deficits and impairments:  Abnormal gait, Dizziness, Decreased balance, Postural dysfunction, Decreased strength, Difficulty walking  Visit Diagnosis: Other abnormalities of gait and mobility  Unsteadiness on feet  Abnormal posture  Other symptoms and signs involving the nervous system     Problem List Patient Active Problem List   Diagnosis Date Noted   SBO (small bowel obstruction) ([place]) 07/06/2019    Aubreyanna Dorrough W. 10/16/2020, 5:56 PM Frazier Butt., PT  [facility] [address] [place], [place], [address] Phone: [phone]   Fax:  [phone]  Name: Cody Dickerson MRN: [id number] Date of Birth: 06-26-1950

## 2020-10-18 ENCOUNTER — Encounter: Payer: Self-pay | Admitting: Occupational Therapy

## 2020-10-18 ENCOUNTER — Ambulatory Visit: Payer: Medicare Other | Admitting: Physical Therapy

## 2020-10-18 ENCOUNTER — Other Ambulatory Visit: Payer: Self-pay

## 2020-10-18 ENCOUNTER — Ambulatory Visit: Payer: Medicare Other | Admitting: Occupational Therapy

## 2020-10-18 ENCOUNTER — Encounter: Payer: Self-pay | Admitting: Physical Therapy

## 2020-10-18 DIAGNOSIS — R2681 Unsteadiness on feet: Secondary | ICD-10-CM

## 2020-10-18 DIAGNOSIS — R41840 Attention and concentration deficit: Secondary | ICD-10-CM

## 2020-10-18 DIAGNOSIS — R293 Abnormal posture: Secondary | ICD-10-CM

## 2020-10-18 DIAGNOSIS — R278 Other lack of coordination: Secondary | ICD-10-CM | POA: Diagnosis not present

## 2020-10-18 DIAGNOSIS — R2689 Other abnormalities of gait and mobility: Secondary | ICD-10-CM

## 2020-10-18 DIAGNOSIS — R29818 Other symptoms and signs involving the nervous system: Secondary | ICD-10-CM

## 2020-10-18 DIAGNOSIS — R251 Tremor, unspecified: Secondary | ICD-10-CM

## 2020-10-18 DIAGNOSIS — R29898 Other symptoms and signs involving the musculoskeletal system: Secondary | ICD-10-CM

## 2020-10-18 NOTE — Therapy (Signed)
[facility] [address] [place], [place], [address] Phone: [phone]   Fax:  [phone]  Physical Therapy Treatment/Discharge Summary  Patient Details  Name: Cody Dickerson MRN: [id number] Date of Birth: [birth year] Referring Provider (PT): Dr. Carles Collet   PHYSICAL THERAPY DISCHARGE SUMMARY  Visits from Start of Care: 13  Current functional level related to goals / functional outcomes:  PT Long Term Goals - 10/18/20 1104       PT LONG TERM GOAL #1   Title Pt will be independent with PD specific HEP in order to build upon functional gains made in therapy. ALL LTGS DUE 10/18/20    Baseline per review previous sessions    Time 8    Period Weeks    Status Achieved      PT LONG TERM GOAL #2   Title Pt will recover posterior and anterior balance in push and release test in 1 robust step independently, for improved balance recovery    Baseline 2 smaller steps; 1 small step on 10/09/20    Time 8    Period Weeks    Status Partially Met      PT LONG TERM GOAL #3   Title Pt will improve miniBEST score to at least a 25/28 in order to demo decr fall risk.    Baseline 22/28; 23/28 on 10/18/20    Time 8    Period Weeks    Status Not Met      PT LONG TERM GOAL #4   Title Pt will improve TUG and TUG cog score to less than or equal to 10% difference for improved dual task/decreased fall risk.    Baseline 9 seconds TUG, 12 seconds cog TUG; on 10/09/20: 8.93 seconds TUG, 10.41 cog TUG; TUG 9.72/cognitive 9.84 sec    Time 8    Period Weeks    Status Achieved               Remaining deficits: Balance recovery in posterior/lateral directions-improving   Education / Equipment: Educated in [employer], Parkinson's resources   Patient agrees to discharge. Patient goals were partially met. Patient is being discharged due to being pleased with the current functional level.    Mady Haagensen, PT 10/18/20 12:03 PM Phone:  [phone] Fax: [phone]  Encounter Date: 10/18/2020   PT End of Session - 10/18/20 1023     Visit Number 13    Number of Visits 13    Date for PT Re-Evaluation 10/22/20    Authorization Type Medicare    PT Start Time 1020    PT Stop Time 1058    PT Time Calculation (min) 38 min    Activity Tolerance Patient tolerated treatment well    Behavior During Therapy [facility] for tasks assessed/performed             Past Medical History:  Diagnosis Date   Balance problem    Gout    Hypercholesterolemia    Hypertension    Small bowel obstruction ([place])     Past Surgical History:  Procedure Laterality Date   BOWEL BLOCKAGE  05/2019    There were no vitals filed for this visit.   Subjective Assessment - 10/18/20 1022     Subjective No changes.    Pertinent History PD (diagnosed december 2021), Lt distal ICA aneurysm, HTN, chronic dizziness and imbalance    Limitations Walking    Patient Stated Goals wants to get his balance back    Currently in Pain?  No/denies                               [facility] Adult PT Treatment/Exercise - 10/18/20 0001       Mini-BESTest   Sit To Stand Normal: Comes to stand without use of hands and stabilizes independently.    Rise to Toes Normal: Stable for 3 s with maximum height.    Stand on one leg (left) Moderate: < 20 s   2.8, 4.16   Stand on one leg (right) Moderate: < 20 s   3.22, 3.06   Stand on one leg - lowest score 1    Compensatory Stepping Correction - Forward Normal: Recovers independently with a single, large step (second realignement is allowed).    Compensatory Stepping Correction - Backward Moderate: More than one step is required to recover equilibrium    Compensatory Stepping Correction - Left Lateral Moderate: Several steps to recover equilibrium    Compensatory Stepping Correction - Right Lateral Moderate: Several steps to recover equilibrium    Stepping Corredtion Lateral - lowest score 1    Stance -  Feet together, eyes open, firm surface  Normal: 30s    Stance - Feet together, eyes closed, foam surface  Normal: 30s   2nd trial made 30 sec with postural sway   Incline - Eyes Closed Normal: Stands independently 30s and aligns with gravity    Change in Gait Speed Normal: Significantly changes walkling speed without imbalance    Walk with head turns - Horizontal Moderate: performs head turns with reduction in gait speed.    Walk with pivot turns Normal: Turns with feet close FAST (< 3 steps) with good balance.    Step over obstacles Moderate: Steps over box but touches box OR displays cautious behavior by slowing gait.    Timed UP & GO with Dual Task Normal: No noticeable change in sitting, standing or walking while backward counting when compared to TUG without    Mini-BEST total score 23      High Level Balance   High Level Balance Activities Other (comment)    High Level Balance Comments At counter, posterior step and weightshift at counter, 5 reps 3 sets with cues for smooth weightshift and return to tall posture.  Educated in posterior step for balance strategy work.  Added to HEP      Therapeutic Activites    Therapeutic Activities Other Therapeutic Activities    Other Therapeutic Activities Discussed progress towards goals and POC, plans for d/c this visit.  Pt agreeable.  Discused PT eval in 6-9 months.  Discussed fall prevention in relation to pt's MiniBestest blaance score and recommendations to daily continue PT exercises.                    PT Education - 10/18/20 1103     Education Details POC, progress towards goals, HEP addition (see instruction)    Person(s) Educated Patient    Methods Explanation;Demonstration;Handout    Comprehension Verbalized understanding;Returned demonstration              PT Short Term Goals - 09/25/20 1025       PT SHORT TERM GOAL #1   Title Pt will be independent with initial HEP in order to build upon functional gains made in  therapy. ALL STGS DUE 09/20/20    Baseline require verbal cues for proper completion of PWR Up, completing daily per paitent reports  Time 4    Period Weeks    Status Partially Met    Target Date 09/20/20      PT SHORT TERM GOAL #2   Title Pt will decr 5x sit <> stand time without UE support to 12 seconds or less to demo improved transfer efficiency.    Baseline 13.56 seconds; 13.35 seconds    Time 4    Period Weeks    Status Not Met      PT SHORT TERM GOAL #3   Title Pt will verbalize understanding of local Parkinson's disease resources, including options for continued community fitness.    Baseline provided handout' verbalzied understanding    Time 4    Period Weeks    Status Achieved               PT Long Term Goals - 10/18/20 1104       PT LONG TERM GOAL #1   Title Pt will be independent with PD specific HEP in order to build upon functional gains made in therapy. ALL LTGS DUE 10/18/20    Baseline per review previous sessions    Time 8    Period Weeks    Status Achieved      PT LONG TERM GOAL #2   Title Pt will recover posterior and anterior balance in push and release test in 1 robust step independently, for improved balance recovery    Baseline 2 smaller steps; 1 small step on 10/09/20    Time 8    Period Weeks    Status Partially Met      PT LONG TERM GOAL #3   Title Pt will improve miniBEST score to at least a 25/28 in order to demo decr fall risk.    Baseline 22/28; 23/28 on 10/18/20    Time 8    Period Weeks    Status Not Met      PT LONG TERM GOAL #4   Title Pt will improve TUG and TUG cog score to less than or equal to 10% difference for improved dual task/decreased fall risk.    Baseline 9 seconds TUG, 12 seconds cog TUG; on 10/09/20: 8.93 seconds TUG, 10.41 cog TUG; TUG 9.72/cognitive 9.84 sec    Time 8    Period Weeks    Status Achieved                   Plan - 10/18/20 1105     Clinical Impression Statement Assessed remaining LTGs  this visit, with pt meeting LTG 1 for HEP; LTG 3 not met, with MiniBESTest score improved to 23/28, just not to goal level.  Pt has been educated in HEP to address large amplitude based movement patterns for Parkinson's specific exercises.  He continues to have balance deficits, but they have improved with therapy; he is appropriate for discharge from therapy at this time.    Personal Factors and Comorbidities Comorbidity 2;Time since onset of injury/illness/exacerbation;Past/Current Experience    Comorbidities PD, h/o small bowel obstruction, HTN, Lt distal ICA aneurysm    Examination-Activity Limitations Locomotion Level;Bend;Squat;Transfers;Reach Overhead    Examination-Participation Restrictions Yard Work;Meal Prep;Shop;Community Activity;Interpersonal Relationship;Other   playing golf   Stability/Clinical Decision Making Stable/Uncomplicated    Rehab Potential Good    PT Frequency 2x / week   12 visits over 6 weeks   PT Duration 8 weeks   12 visits over 6 weeks   PT Treatment/Interventions ADLs/Self Care Home Management;Balance training;Vestibular;Gait training;Therapeutic exercise;Therapeutic activities;Neuromuscular re-education;Patient/family education;DME Instruction;Stair  training;Functional mobility training    PT Next Visit Plan Discharge this visit, with plans for return PT eval in 6-19 months.    Consulted and Agree with Plan of Care Patient             Patient will benefit from skilled therapeutic intervention in order to improve the following deficits and impairments:  Abnormal gait, Dizziness, Decreased balance, Postural dysfunction, Decreased strength, Difficulty walking  Visit Diagnosis: Unsteadiness on feet  Other abnormalities of gait and mobility  Other symptoms and signs involving the nervous system     Problem List Patient Active Problem List   Diagnosis Date Noted   SBO (small bowel obstruction) ([place]) 07/06/2019    Jahad Old W. 10/18/2020, 12:01  PM Frazier Butt., PT   [facility] [address] [place], [place], [address] Phone: [phone]   Fax:  [phone]  Name: Cody Dickerson MRN: [id number] Date of Birth: 08-03-1950

## 2020-10-18 NOTE — Therapy (Signed)
[facility] [address] [place], [place], [address] Phone: [phone]   Fax:  [phone]  Occupational Therapy Treatment  Patient Details  Name: Cody Dickerson MRN: [id number] Date of Birth: Mar 12, 1950 Referring Provider (OT): Dr. Wells Guiles Tat   Encounter Date: 10/18/2020   OT End of Session - 10/18/20 1104     Visit Number 5    Number of Visits 17    Date for OT Re-Evaluation 11/30/20    Authorization Type Medicare & [employer], covered at 100%    Authorization - Visit Number 5    Authorization - Number of Visits 10    Progress Note Due on Visit 10    OT Start Time 1107    OT Stop Time 1146    OT Time Calculation (min) 39 min    Activity Tolerance Patient tolerated treatment well    Behavior During Therapy [facility] for tasks assessed/performed             Past Medical History:  Diagnosis Date   Balance problem    Gout    Hypercholesterolemia    Hypertension    Small bowel obstruction [facility])     Past Surgical History:  Procedure Laterality Date   BOWEL BLOCKAGE  05/2019    There were no vitals filed for this visit.   Subjective Assessment - 10/18/20 1104     Subjective  Reports writing at home is going well    Pertinent History Parkinson's Disease.  PMH:  Hypercholesterolemia, HTN, hx of gout, hx small bowel blockage.    Patient Stated Goals improve writing, improve ADLs    Currently in Pain? No/denies               Practiced writing 8 sentences with min decr in size/spacing at end of words/lines, particularly last 2 sentences.  Reviewed writing strategies and also instructed pt to place paper closer to him, pause and move wrist/paper, and incr effort/intention with writing, spelling in head, and write bigger.    Flicking/sliding cards across table with each hand with only occasional cueing for PWR! Hands/incr movement amplitude.    Pt instructed in large amplitude movement strategies for  dressing.  Then simulated dressing with bag with focus/min cues for large amplitude movements:  Donning/doffing pull-over shirt, donning/doffing pants, pulling bag into hand for clothing adjustment/donning socks.   Educated pt in donning/doffing jacket using large amplitude movement strategies.  Pt returned demo with repetition and use/min cues for large amplitude movements (particularly feet apart and trunk rotation).           OT Short Term Goals - 10/16/20 1427       OT SHORT TERM GOAL #1   Title Pt will be independent with PD-specific HEP--check STGs 10/30/20    Time 4    Period Weeks    Status On-going      OT SHORT TERM GOAL #2   Title Pt will verbalize understanding of ways to decr risk of future complications related to PD.    Time 4    Period Weeks    Status On-going      OT SHORT TERM GOAL #3   Title Pt will report incr ease in using LUE to assist in washing hair.    Time 4    Period Weeks    Status On-going      OT SHORT TERM GOAL #4   Title Pt will don/doff jacket using large amplitude movement strategies (feet apart, associated trunk  movements) for incr ease and safety.    Time 4    Period Weeks    Status New               OT Long Term Goals - 09/18/20 1451       OT LONG TERM GOAL #1   Title Pt will verbalize understanding of adaptive strategies for ADLs/IADLs to incr ease/safety, and decr risk of future complications.--check LTGs 11/30/20    Time 8    Period Weeks    Status New      OT LONG TERM GOAL #2   Title Pt will verbalize understanding of memory compensation strategies and ways to keep thinking skills sharp.    Time 8    Period Weeks    Status New      OT LONG TERM GOAL #3   Title Pt will improve bilateral hand coordination and ability to dress as shown by fastening/unfastening 3 buttons in less than or equal to 40sec.    Baseline 57.12sec    Time 8    Period Weeks    Status New      OT LONG TERM GOAL #4   Title Pt improve  functional reach and coordination as shown by improving box and block score by at least 3 blocks bilateral.    Baseline R-44 blocks, L-47 blocks    Time 8    Period Weeks    Status New      OT LONG TERM GOAL #5   Title Pt will verbalize understanding of PD related community resources.    Time 8    Period Weeks    Status New                   Plan - 10/18/20 1114     Clinical Impression Statement Pt is progressing towards goals. Pt demonstrates improved handwriting legibility/size.  Pt with good response to cueing for large amplitude movements for simulated ADL tasks.    OT Occupational Profile and History Detailed Assessment- Review of Records and additional review of physical, cognitive, psychosocial history related to current functional performance    Occupational performance deficits (Please refer to evaluation for details): ADL's;IADL's;Work;Leisure    Body Structure / Function / Physical Skills ADL;Balance;Decreased knowledge of use of DME;Tone;GMC;Dexterity;UE functional use;IADL;FMC;Coordination;Mobility;Improper spinal/pelvic alignment;Body mechanics;Proprioception    Cognitive Skills Attention;Memory    Rehab Potential Good    Clinical Decision Making Several treatment options, min-mod task modification necessary    Comorbidities Affecting Occupational Performance: May have comorbidities impacting occupational performance    Modification or Assistance to Complete Evaluation  Min-Moderate modification of tasks or assist with assess necessary to complete eval    OT Frequency 2x / week    OT Duration 8 weeks   +eval (delayed start in scheduling), may only need 6 weeks depending on progress   OT Treatment/Interventions Self-care/ADL training;Moist Heat;DME and/or AE instruction;Balance training;Therapeutic activities;Aquatic Therapy;Cognitive remediation/compensation;Therapeutic exercise;Cryotherapy;Energy conservation;Manual Therapy;Patient/family education;Passive range of  motion;Functional Mobility Training;Neuromuscular education    Plan continue with ADL strategies--issue handout, review HEPs prn    OT Home Exercise Plan educated provided:  PWR! seated; PWR! hands; coordination HEP, handwriting strategies    Consulted and Agree with Plan of Care Patient             Patient will benefit from skilled therapeutic intervention in order to improve the following deficits and impairments:   Body Structure / Function / Physical Skills: ADL, Balance, Decreased knowledge of use of DME, Tone,  [place], Dexterity, UE functional use, IADL, [place], Coordination, Mobility, Improper spinal/pelvic alignment, Body mechanics, Proprioception Cognitive Skills: Attention, Memory     Visit Diagnosis: Other lack of coordination  Other symptoms and signs involving the musculoskeletal system  Other symptoms and signs involving the nervous system  Abnormal posture  Tremor  Unsteadiness on feet  Attention and concentration deficit    Problem List Patient Active Problem List   Diagnosis Date Noted   SBO (small bowel obstruction) ([place]) 07/06/2019    [facility] 10/18/2020, 12:17 PM  [facility] [address] [place], [place], [address] Phone: [phone]   Fax:  [phone]  Name: KEWON ROERING MRN: [id number] Date of Birth: 1950-04-04  Vianne Bulls, OTR/L [facility] [address] [place], [place]  [address] [phone] phone [phone] 10/18/20 12:17 PM

## 2020-10-18 NOTE — Patient Instructions (Signed)
Access Code: [email or web address] URL: [email or web address] Date: 10/18/2020 Prepared by: Mady Haagensen  Exercises Alternating Step Backward with Support - 1 x daily - 7 x weekly - 1 sets - 10 reps

## 2020-10-23 ENCOUNTER — Ambulatory Visit: Payer: Medicare Other | Admitting: Occupational Therapy

## 2020-10-23 ENCOUNTER — Other Ambulatory Visit: Payer: Self-pay

## 2020-10-23 ENCOUNTER — Encounter: Payer: Self-pay | Admitting: Occupational Therapy

## 2020-10-23 DIAGNOSIS — R2681 Unsteadiness on feet: Secondary | ICD-10-CM

## 2020-10-23 DIAGNOSIS — R29818 Other symptoms and signs involving the nervous system: Secondary | ICD-10-CM

## 2020-10-23 DIAGNOSIS — R278 Other lack of coordination: Secondary | ICD-10-CM | POA: Diagnosis not present

## 2020-10-23 DIAGNOSIS — R29898 Other symptoms and signs involving the musculoskeletal system: Secondary | ICD-10-CM | POA: Diagnosis not present

## 2020-10-23 DIAGNOSIS — R2689 Other abnormalities of gait and mobility: Secondary | ICD-10-CM

## 2020-10-23 DIAGNOSIS — R293 Abnormal posture: Secondary | ICD-10-CM

## 2020-10-23 DIAGNOSIS — R251 Tremor, unspecified: Secondary | ICD-10-CM

## 2020-10-23 NOTE — Therapy (Signed)
[facility] [address] [place], [place], [address] Phone: [phone]   Fax:  [phone]  Occupational Therapy Treatment  Patient Details  Name: Cody Dickerson MRN: [id number] Date of Birth: [birth year] Referring Provider (OT): Dr. Wells Guiles Tat   Encounter Date: 10/23/2020   OT End of Session - 10/23/20 1108     Visit Number 6    Number of Visits 17    Date for OT Re-Evaluation 11/30/20    Authorization Type Medicare & [place], covered at 100%    Authorization - Visit Number 6    Authorization - Number of Visits 10    Progress Note Due on Visit 10    OT Start Time 1105    OT Stop Time 1148    OT Time Calculation (min) 43 min    Activity Tolerance Patient tolerated treatment well    Behavior During Therapy [facility] for tasks assessed/performed             Past Medical History:  Diagnosis Date   Balance problem    Gout    Hypercholesterolemia    Hypertension    Small bowel obstruction [facility])     Past Surgical History:  Procedure Laterality Date   BOWEL BLOCKAGE  05/2019    There were no vitals filed for this visit.   Subjective Assessment - 10/23/20 1106     Subjective  nothing new    Pertinent History Parkinson's Disease.  PMH:  Hypercholesterolemia, HTN, hx of gout, hx small bowel blockage.    Patient Stated Goals improve writing, improve ADLs    Currently in Pain? No/denies              Practiced using large amplitude movement strategies for opening bottles, grasping objects, picking up items from floor, fastening/unfastening buttons.  Pt returned demo with min cueing after instruction.   Began education regarding reducing risk of future complications (large amplitude movement for ADLs, HEP, posture, keeping feet apart in standing, trunk/head/eye movements with reach).        OT Education - 10/23/20 1107     Education Details Large amplitude movement strategies for ADLs/IADLs-see pt  instructions    Person(s) Educated Patient    Methods Explanation;Demonstration;Verbal cues;Handout    Comprehension Verbalized understanding;Returned demonstration;Verbal cues required              OT Short Term Goals - 10/16/20 1427       OT SHORT TERM GOAL #1   Title Pt will be independent with PD-specific HEP--check STGs 10/30/20    Time 4    Period Weeks    Status On-going      OT SHORT TERM GOAL #2   Title Pt will verbalize understanding of ways to decr risk of future complications related to PD.    Time 4    Period Weeks    Status On-going      OT SHORT TERM GOAL #3   Title Pt will report incr ease in using LUE to assist in washing hair.    Time 4    Period Weeks    Status On-going      OT SHORT TERM GOAL #4   Title Pt will don/doff jacket using large amplitude movement strategies (feet apart, associated trunk movements) for incr ease and safety.    Time 4    Period Weeks    Status New               OT Long Term  Goals - 09/18/20 1451       OT LONG TERM GOAL #1   Title Pt will verbalize understanding of adaptive strategies for ADLs/IADLs to incr ease/safety, and decr risk of future complications.--check LTGs 11/30/20    Time 8    Period Weeks    Status New      OT LONG TERM GOAL #2   Title Pt will verbalize understanding of memory compensation strategies and ways to keep thinking skills sharp.    Time 8    Period Weeks    Status New      OT LONG TERM GOAL #3   Title Pt will improve bilateral hand coordination and ability to dress as shown by fastening/unfastening 3 buttons in less than or equal to 40sec.    Baseline 57.12sec    Time 8    Period Weeks    Status New      OT LONG TERM GOAL #4   Title Pt improve functional reach and coordination as shown by improving box and block score by at least 3 blocks bilateral.    Baseline R-44 blocks, L-47 blocks    Time 8    Period Weeks    Status New      OT LONG TERM GOAL #5   Title Pt will  verbalize understanding of PD related community resources.    Time 8    Period Weeks    Status New                   Plan - 10/23/20 1113     Clinical Impression Statement Pt is progressing towards goals. Pt verbalizes understanding of large amplitude movement strategies for ADLs/IADLs after initial education, but will need reinforcement/pactice to ensure calibration and incr awareness of current movement patterns.    OT Occupational Profile and History Detailed Assessment- Review of Records and additional review of physical, cognitive, psychosocial history related to current functional performance    Occupational performance deficits (Please refer to evaluation for details): ADL's;IADL's;Work;Leisure    Body Structure / Function / Physical Skills ADL;Balance;Decreased knowledge of use of DME;Tone;GMC;Dexterity;UE functional use;IADL;FMC;Coordination;Mobility;Improper spinal/pelvic alignment;Body mechanics;Proprioception    Cognitive Skills Attention;Memory    Rehab Potential Good    Clinical Decision Making Several treatment options, min-mod task modification necessary    Comorbidities Affecting Occupational Performance: May have comorbidities impacting occupational performance    Modification or Assistance to Complete Evaluation  Min-Moderate modification of tasks or assist with assess necessary to complete eval    OT Frequency 2x / week    OT Duration 8 weeks   +eval (delayed start in scheduling), may only need 6 weeks depending on progress   OT Treatment/Interventions Self-care/ADL training;Moist Heat;DME and/or AE instruction;Balance training;Therapeutic activities;Aquatic Therapy;Cognitive remediation/compensation;Therapeutic exercise;Cryotherapy;Energy conservation;Manual Therapy;Patient/family education;Passive range of motion;Functional Mobility Training;Neuromuscular education    Plan PWR! moves in quadruped/modified quadruped, continue with large amplitude movements for  functional tasks    OT Home Exercise Plan educated provided:  PWR! seated; PWR! hands; coordination HEP, handwriting strategies    Consulted and Agree with Plan of Care Patient             Patient will benefit from skilled therapeutic intervention in order to improve the following deficits and impairments:   Body Structure / Function / Physical Skills: ADL, Balance, Decreased knowledge of use of DME, Tone, GMC, Dexterity, UE functional use, IADL, FMC, Coordination, Mobility, Improper spinal/pelvic alignment, Body mechanics, Proprioception Cognitive Skills: Attention, Memory     Visit Diagnosis: Other  lack of coordination  Other symptoms and signs involving the musculoskeletal system  Other symptoms and signs involving the nervous system  Abnormal posture  Tremor  Unsteadiness on feet  Other abnormalities of gait and mobility    Problem List Patient Active Problem List   Diagnosis Date Noted   SBO (small bowel obstruction) ([facility]) 07/06/2019    [facility] 10/23/2020, 12:14 PM  [facility] [address] [place], [place], [address] Phone: [phone]   Fax:  [phone]  Name: SHERMAN PERAULT MRN: [id number] Date of Birth: 1950-05-31   Vianne Bulls, OTR/L [facility] [address] [place], [place]  [address] [phone] phone [phone] 10/23/20 12:14 PM

## 2020-10-23 NOTE — Patient Instructions (Signed)
   Performing Daily Activities with Big Movements  Pick at least 2 activities a day and perform with BIG, DELIBERATE movements/effort.  Try different activities each day. This can make the activity easier and turn daily activities into exercise to prevent problems in the future!  If you are standing during the activity, make sure to keep feet apart and stand with good/big/PWR! UP posture.  Examples: Dressing  Push arms in sleeves, twist when putting on jacket, good posture and bring shirt to head (don't bring head down) push foot into pants deliberately open hands to pull down shirt/put on socks/pull up pants Buttoning - Open hands big (PWR! Hands) before fastening each button, deliberate movement (angry buttons) Bathing - Wash/dry with long strokes Brushing your teeth - Big, slow movements Cutting food - Long deliberate cuts, put tip of knife down in front of food Eating - Hold utensil in the middle, not the end, hold fork straight up and down to stab food Picking up a cup/bottle - Open hand up big and get object all the way in palm Opening jar/bottle - Move as much as you can with each turn, twist wrist Putting on seatbelt - Twist when reaching, look at where you are reaching Hanging up clothes/getting clothes down from closet - Reach with big effort, open hand, straighten elbow Putting away groceries/dishes - Reach with big effort Wiping counter/table - Move in big, long strokes, open hand Stirring while cooking - Exaggerate movement Cleaning windows - Move in big, long strokes Sweeping/Raking- Move arms in big, long strokes, feet apart and one in front of the other Vacuuming - Push with big movement, feet apart and one in front of the other Folding clothes - Exaggerate arm movements Washing car - Move in big, long strokes, feet apart Changing light bulb or Using a screwdriver - Move as much as you can with each turn, twist wrist Walking into a store/restaurant - Walk with big steps,  swing arms if able Standing up from a chair/recliner/sofa - Scoot forward, lean forward, and stand with big effort Picking up something from floor/reaching in low cabinet - Get close to object, Position feet apart and one in front of the other.

## 2020-10-25 ENCOUNTER — Encounter: Payer: Self-pay | Admitting: Occupational Therapy

## 2020-10-25 ENCOUNTER — Ambulatory Visit: Payer: Medicare Other | Admitting: Occupational Therapy

## 2020-10-25 ENCOUNTER — Other Ambulatory Visit: Payer: Self-pay

## 2020-10-25 DIAGNOSIS — R29898 Other symptoms and signs involving the musculoskeletal system: Secondary | ICD-10-CM

## 2020-10-25 DIAGNOSIS — R278 Other lack of coordination: Secondary | ICD-10-CM

## 2020-10-25 DIAGNOSIS — R29818 Other symptoms and signs involving the nervous system: Secondary | ICD-10-CM

## 2020-10-25 DIAGNOSIS — R2681 Unsteadiness on feet: Secondary | ICD-10-CM | POA: Diagnosis not present

## 2020-10-25 DIAGNOSIS — R293 Abnormal posture: Secondary | ICD-10-CM | POA: Diagnosis not present

## 2020-10-25 DIAGNOSIS — R2689 Other abnormalities of gait and mobility: Secondary | ICD-10-CM | POA: Diagnosis not present

## 2020-10-25 DIAGNOSIS — R41840 Attention and concentration deficit: Secondary | ICD-10-CM

## 2020-10-25 DIAGNOSIS — R251 Tremor, unspecified: Secondary | ICD-10-CM

## 2020-10-25 NOTE — Therapy (Signed)
[facility] [address] [place], [place], [address] Phone: [phone]   Fax:  [phone]  Occupational Therapy Treatment  Patient Details  Name: Cody Dickerson MRN: [id number] Date of Birth: Jan 29, 1951 Referring Provider (OT): Dr. Wells Guiles Tat   Encounter Date: 10/25/2020   OT End of Session - 10/25/20 1108     Visit Number 7    Number of Visits 17    Date for OT Re-Evaluation 11/30/20    Authorization Type Medicare & [employer], covered at 100%    Authorization - Visit Number 7    Authorization - Number of Visits 10    Progress Note Due on Visit 10    OT Start Time 1106    OT Stop Time 1145    OT Time Calculation (min) 39 min    Activity Tolerance Patient tolerated treatment well    Behavior During Therapy [facility] for tasks assessed/performed             Past Medical History:  Diagnosis Date   Balance problem    Gout    Hypercholesterolemia    Hypertension    Small bowel obstruction [facility])     Past Surgical History:  Procedure Laterality Date   BOWEL BLOCKAGE  05/2019    There were no vitals filed for this visit.   Subjective Assessment - 10/25/20 1108     Subjective  Pt reports attempting to move with larger movements with putting on seatbelt and bathing    Pertinent History Parkinson's Disease.  PMH:  Hypercholesterolemia, HTN, hx of gout, hx small bowel blockage.    Patient Stated Goals improve writing, improve ADLs    Currently in Pain? No/denies               Sitting, functional reaching with trunk rotation (forward and in ER) with flipping cards and focus on posture, supination, trunk rotation with large amplitude movements with min cueing.  Sitting, closed-chain movements with ball:  floor>overhead and diagonals to each side with focus on trunk rotation/associated head and trunk movements with min cueing.        OT Education - 10/25/20 1157     Education Details PWR! moves (basic  4) in modified quadruped (at counter)    Person(s) Educated Patient    Methods Explanation;Demonstration;Verbal cues;Handout;Tactile cues    Comprehension Verbalized understanding;Returned demonstration;Verbal cues required              OT Short Term Goals - 10/16/20 1427       OT SHORT TERM GOAL #1   Title Pt will be independent with PD-specific HEP--check STGs 10/30/20    Time 4    Period Weeks    Status On-going      OT SHORT TERM GOAL #2   Title Pt will verbalize understanding of ways to decr risk of future complications related to PD.    Time 4    Period Weeks    Status On-going      OT SHORT TERM GOAL #3   Title Pt will report incr ease in using LUE to assist in washing hair.    Time 4    Period Weeks    Status On-going      OT SHORT TERM GOAL #4   Title Pt will don/doff jacket using large amplitude movement strategies (feet apart, associated trunk movements) for incr ease and safety.    Time 4    Period Weeks    Status New  OT Long Term Goals - 09/18/20 1451       OT LONG TERM GOAL #1   Title Pt will verbalize understanding of adaptive strategies for ADLs/IADLs to incr ease/safety, and decr risk of future complications.--check LTGs 11/30/20    Time 8    Period Weeks    Status New      OT LONG TERM GOAL #2   Title Pt will verbalize understanding of memory compensation strategies and ways to keep thinking skills sharp.    Time 8    Period Weeks    Status New      OT LONG TERM GOAL #3   Title Pt will improve bilateral hand coordination and ability to dress as shown by fastening/unfastening 3 buttons in less than or equal to 40sec.    Baseline 57.12sec    Time 8    Period Weeks    Status New      OT LONG TERM GOAL #4   Title Pt improve functional reach and coordination as shown by improving box and block score by at least 3 blocks bilateral.    Baseline R-44 blocks, L-47 blocks    Time 8    Period Weeks    Status New      OT LONG  TERM GOAL #5   Title Pt will verbalize understanding of PD related community resources.    Time 8    Period Weeks    Status New                   Plan - 10/25/20 1135     Clinical Impression Statement Pt is progressing towards goals with improving awareness of movement patterns; however, pt continues to need mod cueing for posture and wider base of support.    OT Occupational Profile and History Detailed Assessment- Review of Records and additional review of physical, cognitive, psychosocial history related to current functional performance    Occupational performance deficits (Please refer to evaluation for details): ADL's;IADL's;Work;Leisure    Body Structure / Function / Physical Skills ADL;Balance;Decreased knowledge of use of DME;Tone;GMC;Dexterity;UE functional use;IADL;FMC;Coordination;Mobility;Improper spinal/pelvic alignment;Body mechanics;Proprioception    Cognitive Skills Attention;Memory    Rehab Potential Good    Clinical Decision Making Several treatment options, min-mod task modification necessary    Comorbidities Affecting Occupational Performance: May have comorbidities impacting occupational performance    Modification or Assistance to Complete Evaluation  Min-Moderate modification of tasks or assist with assess necessary to complete eval    OT Frequency 2x / week    OT Duration 8 weeks   +eval (delayed start in scheduling), may only need 6 weeks depending on progress   OT Treatment/Interventions Self-care/ADL training;Moist Heat;DME and/or AE instruction;Balance training;Therapeutic activities;Aquatic Therapy;Cognitive remediation/compensation;Therapeutic exercise;Cryotherapy;Energy conservation;Manual Therapy;Patient/family education;Passive range of motion;Functional Mobility Training;Neuromuscular education    Plan review PWR! moves in quadruped/modified quadruped, continue with large amplitude movements for functional tasks, begin checking STGs    OT Home  Exercise Plan educated provided:  PWR! seated; PWR! hands; coordination HEP, handwriting strategies    Consulted and Agree with Plan of Care Patient             Patient will benefit from skilled therapeutic intervention in order to improve the following deficits and impairments:   Body Structure / Function / Physical Skills: ADL, Balance, Decreased knowledge of use of DME, Tone, GMC, Dexterity, UE functional use, IADL, FMC, Coordination, Mobility, Improper spinal/pelvic alignment, Body mechanics, Proprioception Cognitive Skills: Attention, Memory     Visit Diagnosis: Other  lack of coordination  Other symptoms and signs involving the musculoskeletal system  Other symptoms and signs involving the nervous system  Abnormal posture  Tremor  Unsteadiness on feet  Attention and concentration deficit  Other abnormalities of gait and mobility    Problem List Patient Active Problem List   Diagnosis Date Noted   SBO (small bowel obstruction) ([place]) 07/06/2019    [facility] 10/25/2020, 11:58 AM  [facility] [address] [place], [place], [address] Phone: [phone]   Fax:  [phone]  Name: Cody Dickerson MRN: [id number] Date of Birth: January 14, 1951  Vianne Bulls, OTR/L [facility] [address] [place], [place]  [address] [phone] phone [phone] 10/25/20 11:58 AM

## 2020-10-26 NOTE — Telephone Encounter (Signed)
From patient.

## 2020-10-29 ENCOUNTER — Other Ambulatory Visit: Payer: Self-pay

## 2020-10-29 MED ORDER — AMLODIPINE BESYLATE 2.5 MG PO TABS: 10.0000 mg | ORAL_TABLET | Freq: Every day | ORAL | 1 refills | Status: DC

## 2020-10-30 ENCOUNTER — Ambulatory Visit: Payer: Medicare Other | Admitting: Occupational Therapy

## 2020-10-30 ENCOUNTER — Encounter: Payer: Self-pay | Admitting: Occupational Therapy

## 2020-10-30 ENCOUNTER — Other Ambulatory Visit: Payer: Self-pay

## 2020-10-30 DIAGNOSIS — R29898 Other symptoms and signs involving the musculoskeletal system: Secondary | ICD-10-CM | POA: Diagnosis not present

## 2020-10-30 DIAGNOSIS — R251 Tremor, unspecified: Secondary | ICD-10-CM

## 2020-10-30 DIAGNOSIS — R29818 Other symptoms and signs involving the nervous system: Secondary | ICD-10-CM

## 2020-10-30 DIAGNOSIS — R278 Other lack of coordination: Secondary | ICD-10-CM | POA: Diagnosis not present

## 2020-10-30 DIAGNOSIS — R2681 Unsteadiness on feet: Secondary | ICD-10-CM

## 2020-10-30 DIAGNOSIS — R293 Abnormal posture: Secondary | ICD-10-CM

## 2020-10-30 DIAGNOSIS — R2689 Other abnormalities of gait and mobility: Secondary | ICD-10-CM | POA: Diagnosis not present

## 2020-10-30 DIAGNOSIS — R41840 Attention and concentration deficit: Secondary | ICD-10-CM

## 2020-10-30 NOTE — Patient Instructions (Addendum)
   Ways to prevent future Parkinson's related complications:  1.   Exercise regularly.  Perform your therapy exercises and incorporate safe aerobic exercise when possible (swimming, stationary bike, arm bike, seated stepper)  2.   Keep you feet apart when you are standing to allow you to have better balance and reach further (which can help with shoulder rigidity).  Also make sure your feet are apart when you are sitting before you stand up.  3.   Focus on BIGGER movements during daily activities- really reach overhead, straighten elbows and extend fingers  4.   Anytime you reach or move shoulder, make sure you have good upright posture.  5.   When dressing or reaching for your seatbelt make sure to use your body to assist by twisting and looking at where you are reaching while you reach--this can help to minimize stress on the shoulder and reduce the risk of a rotator cuff tear  6.  When you reach for something overhead, make sure your thumb is facing up.  This is a better position for your shoulder.  7.  Swing your arms when you walk!  People with PD are at increased risk for frozen shoulder and swinging your arms can reduce this risk.

## 2020-10-30 NOTE — Therapy (Signed)
[place] [address] [place], [place], [address] Phone: [phone]   Fax:  [phone]  Occupational Therapy Treatment  Patient Details  Name: Cody Dickerson MRN: [id number] Date of Birth: 03/25/1950 Referring Provider (OT): Dr. Wells Guiles Tat   Encounter Date: 10/30/2020   OT End of Session - 10/30/20 1234     Visit Number 8    Number of Visits 17    Date for OT Re-Evaluation 11/30/20    Authorization Type Medicare & AARP, covered at 100%    Authorization - Visit Number 8    Authorization - Number of Visits 10    Progress Note Due on Visit 10    OT Start Time 1233    OT Stop Time 1315    OT Time Calculation (min) 42 min    Activity Tolerance Patient tolerated treatment well    Behavior During Therapy [facility] for tasks assessed/performed             Past Medical History:  Diagnosis Date   Balance problem    Gout    Hypercholesterolemia    Hypertension    Small bowel obstruction [facility])     Past Surgical History:  Procedure Laterality Date   BOWEL BLOCKAGE  05/2019    There were no vitals filed for this visit.   Subjective Assessment - 10/30/20 1233     Subjective  have been trying to keep feet apart more    Pertinent History Parkinson's Disease.  PMH:  Hypercholesterolemia, HTN, hx of gout, hx small bowel blockage.    Patient Stated Goals improve writing, improve ADLs    Currently in Pain? No/denies                Reviewed coordination HEP and pt returned demo each with min v.c. for incr movement amplitude.  Reviewed PWR! Moves in modified quadruped.  Pt returned demo each with min v.c., particularly for posture and for feet apart.            OT Education - 10/30/20 1301     Education Details Ways to decr risk for future complications related to PD    Person(s) Educated Patient    Methods Explanation;Handout    Comprehension Verbalized understanding              OT Short  Term Goals - 10/30/20 1248       OT SHORT TERM GOAL #1   Title Pt will be independent with PD-specific HEP--check STGs 10/30/20    Time 4    Period Weeks    Status On-going      OT SHORT TERM GOAL #2   Title Pt will verbalize understanding of ways to decr risk of future complications related to PD.    Time 4    Period Weeks    Status Achieved      OT SHORT TERM GOAL #3   Title Pt will report incr ease in using LUE to assist in washing hair.    Time 4    Period Weeks    Status On-going      OT SHORT TERM GOAL #4   Title Pt will don/doff jacket using large amplitude movement strategies (feet apart, associated trunk movements) for incr ease and safety.    Time 4    Period Weeks    Status On-going               OT Long Term Goals - 09/18/20 1451  OT LONG TERM GOAL #1   Title Pt will verbalize understanding of adaptive strategies for ADLs/IADLs to incr ease/safety, and decr risk of future complications.--check LTGs 11/30/20    Time 8    Period Weeks    Status New      OT LONG TERM GOAL #2   Title Pt will verbalize understanding of memory compensation strategies and ways to keep thinking skills sharp.    Time 8    Period Weeks    Status New      OT LONG TERM GOAL #3   Title Pt will improve bilateral hand coordination and ability to dress as shown by fastening/unfastening 3 buttons in less than or equal to 40sec.    Baseline 57.12sec    Time 8    Period Weeks    Status New      OT LONG TERM GOAL #4   Title Pt improve functional reach and coordination as shown by improving box and block score by at least 3 blocks bilateral.    Baseline R-44 blocks, L-47 blocks    Time 8    Period Weeks    Status New      OT LONG TERM GOAL #5   Title Pt will verbalize understanding of PD related community resources.    Time 8    Period Weeks    Status New                   Plan - 10/30/20 1234     Clinical Impression Statement Pt is progressing towards goals  with improving awareness of movement patterns with less cueing needed.    OT Occupational Profile and History Detailed Assessment- Review of Records and additional review of physical, cognitive, psychosocial history related to current functional performance    Occupational performance deficits (Please refer to evaluation for details): ADL's;IADL's;Work;Leisure    Body Structure / Function / Physical Skills ADL;Balance;Decreased knowledge of use of DME;Tone;GMC;Dexterity;UE functional use;IADL;FMC;Coordination;Mobility;Improper spinal/pelvic alignment;Body mechanics;Proprioception    Cognitive Skills Attention;Memory    Rehab Potential Good    Clinical Decision Making Several treatment options, min-mod task modification necessary    Comorbidities Affecting Occupational Performance: May have comorbidities impacting occupational performance    Modification or Assistance to Complete Evaluation  Min-Moderate modification of tasks or assist with assess necessary to complete eval    OT Frequency 2x / week    OT Duration 8 weeks   +eval (delayed start in scheduling), may only need 6 weeks depending on progress   OT Treatment/Interventions Self-care/ADL training;Moist Heat;DME and/or AE instruction;Balance training;Therapeutic activities;Aquatic Therapy;Cognitive remediation/compensation;Therapeutic exercise;Cryotherapy;Energy conservation;Manual Therapy;Patient/family education;Passive range of motion;Functional Mobility Training;Neuromuscular education    Plan continue with large amplitude movements for functional tasks,continue checking STGs    OT Home Exercise Plan educated provided:  PWR! seated; PWR! hands; coordination HEP, handwriting strategies    Consulted and Agree with Plan of Care Patient             Patient will benefit from skilled therapeutic intervention in order to improve the following deficits and impairments:   Body Structure / Function / Physical Skills: ADL, Balance, Decreased  knowledge of use of DME, Tone, GMC, Dexterity, UE functional use, IADL, FMC, Coordination, Mobility, Improper spinal/pelvic alignment, Body mechanics, Proprioception Cognitive Skills: Attention, Memory     Visit Diagnosis: Other lack of coordination  Other symptoms and signs involving the musculoskeletal system  Other symptoms and signs involving the nervous system  Tremor  Abnormal posture  Unsteadiness on feet  Attention and concentration deficit  Other abnormalities of gait and mobility    Problem List Patient Active Problem List   Diagnosis Date Noted   SBO (small bowel obstruction) ([place]) 07/06/2019    [facility] 10/30/2020, 2:43 PM  [place] [address] [place], [place], [address] Phone: [phone]   Fax:  [phone]  Name: Cody Dickerson MRN: [id number] Date of Birth: 04/04/1950  Vianne Bulls, OTR/L [facility] [address] [place], [place]  [address] [phone] phone [phone] 10/30/20 2:43 PM

## 2020-11-01 ENCOUNTER — Other Ambulatory Visit: Payer: Self-pay

## 2020-11-01 ENCOUNTER — Ambulatory Visit: Payer: Medicare Other | Attending: Neurology | Admitting: Occupational Therapy

## 2020-11-01 ENCOUNTER — Encounter: Payer: Self-pay | Admitting: Occupational Therapy

## 2020-11-01 DIAGNOSIS — R2681 Unsteadiness on feet: Secondary | ICD-10-CM | POA: Diagnosis not present

## 2020-11-01 DIAGNOSIS — R251 Tremor, unspecified: Secondary | ICD-10-CM | POA: Insufficient documentation

## 2020-11-01 DIAGNOSIS — R41840 Attention and concentration deficit: Secondary | ICD-10-CM | POA: Insufficient documentation

## 2020-11-01 DIAGNOSIS — R29818 Other symptoms and signs involving the nervous system: Secondary | ICD-10-CM | POA: Insufficient documentation

## 2020-11-01 DIAGNOSIS — R293 Abnormal posture: Secondary | ICD-10-CM | POA: Diagnosis not present

## 2020-11-01 DIAGNOSIS — R278 Other lack of coordination: Secondary | ICD-10-CM | POA: Diagnosis not present

## 2020-11-01 DIAGNOSIS — R2689 Other abnormalities of gait and mobility: Secondary | ICD-10-CM | POA: Diagnosis not present

## 2020-11-01 DIAGNOSIS — R29898 Other symptoms and signs involving the musculoskeletal system: Secondary | ICD-10-CM | POA: Insufficient documentation

## 2020-11-01 NOTE — Therapy (Signed)
[place] [address] [place], [place], [address] Phone: [phone]   Fax:  [phone]  Occupational Therapy Treatment  Patient Details  Name: Cody Dickerson MRN: [id number] Date of Birth: 21-Feb-1951 Referring Provider (OT): Dr. Wells Guiles Tat   Encounter Date: 11/01/2020   OT End of Session - 11/01/20 1026     Visit Number 9    Number of Visits 17    Date for OT Re-Evaluation 11/30/20    Authorization Type Medicare & AARP, covered at 100%    Authorization - Visit Number 9    Authorization - Number of Visits 10    Progress Note Due on Visit 10    OT Start Time 1025    OT Stop Time 1103    OT Time Calculation (min) 38 min    Activity Tolerance Patient tolerated treatment well    Behavior During Therapy [facility] for tasks assessed/performed             Past Medical History:  Diagnosis Date   Balance problem    Gout    Hypercholesterolemia    Hypertension    Small bowel obstruction [facility])     Past Surgical History:  Procedure Laterality Date   BOWEL BLOCKAGE  05/2019    There were no vitals filed for this visit.   Subjective Assessment - 11/01/20 1025     Subjective  Been keeping my feet apart in the shower and trying to work on posture    Pertinent History Parkinson's Disease.  PMH:  Hypercholesterolemia, HTN, hx of gout, hx small bowel blockage.    Patient Stated Goals improve writing, improve ADLs    Currently in Pain? No/denies             Sliding cards across table using PWR! Hands with min v.c. initially only.  Straightening cards with focus on wrist movement to manipulate in both UEs for bilateral hand coordination.  Flipping cards and dealing cards with BUEs simultaneously for bilateral hand coordination and timing with min difficulty with R hand dealing and min v.c. for incr movement amplitude.  Pt instructed to perform coordination HEP with BUEs simultaneously to incr difficulty  prn.  Practiced buttoning/unbuttoning shirt on table top with min cues for use of PWR! Hands prior to buttoning and use of deliberate/large amplitude movements for fastening and push-pull technique for unfastening after instruction.  Pt demo improvement with repetition and min-mod cues for use of large amplitude movements.  Standing to pick up object from the floor with big movement strategy for incr safety and ease.  Emphasized feet apart, with 1 foot in front with min cueing/repetition.    Practiced donning/doffing jacket using large amplitude movement strategies after review with min cues.  Simulated ADLs with bag with focus/min cues for large amplitude movements:  Donning/doffing pull-over shirt, donning/doffing pants, pulling bag into hand for clothing adjustment/donning socks, pulling shirt down in back,  drying back.       OT Short Term Goals - 11/01/20 1027       OT SHORT TERM GOAL #1   Title Pt will be independent with PD-specific HEP--check STGs 10/30/20    Time 4    Period Weeks    Status Achieved      OT SHORT TERM GOAL #2   Title Pt will verbalize understanding of ways to decr risk of future complications related to PD.    Time 4    Period Weeks    Status Achieved  OT SHORT TERM GOAL #3   Title Pt will report incr ease in using LUE to assist in washing hair.    Time 4    Period Weeks    Status On-going   11/01/20:  not fully met     OT SHORT TERM GOAL #4   Title Pt will don/doff jacket using large amplitude movement strategies (feet apart, associated trunk movements) for incr ease and safety.    Time 4    Period Weeks    Status On-going   11/01/20:  improved, but needs min cueing.              OT Long Term Goals - 09/18/20 1451       OT LONG TERM GOAL #1   Title Pt will verbalize understanding of adaptive strategies for ADLs/IADLs to incr ease/safety, and decr risk of future complications.--check LTGs 11/30/20    Time 8    Period Weeks    Status New       OT LONG TERM GOAL #2   Title Pt will verbalize understanding of memory compensation strategies and ways to keep thinking skills sharp.    Time 8    Period Weeks    Status New      OT LONG TERM GOAL #3   Title Pt will improve bilateral hand coordination and ability to dress as shown by fastening/unfastening 3 buttons in less than or equal to 40sec.    Baseline 57.12sec    Time 8    Period Weeks    Status New      OT LONG TERM GOAL #4   Title Pt improve functional reach and coordination as shown by improving box and block score by at least 3 blocks bilateral.    Baseline R-44 blocks, L-47 blocks    Time 8    Period Weeks    Status New      OT LONG TERM GOAL #5   Title Pt will verbalize understanding of PD related community resources.    Time 8    Period Weeks    Status New                   Plan - 11/01/20 1026     Clinical Impression Statement Pt is progressing towards goals with improved movement amplitude, but continues to need cueing.  Pt would benefit from futher repetition for calibration/carryover.    OT Occupational Profile and History Detailed Assessment- Review of Records and additional review of physical, cognitive, psychosocial history related to current functional performance    Occupational performance deficits (Please refer to evaluation for details): ADL's;IADL's;Work;Leisure    Body Structure / Function / Physical Skills ADL;Balance;Decreased knowledge of use of DME;Tone;GMC;Dexterity;UE functional use;IADL;FMC;Coordination;Mobility;Improper spinal/pelvic alignment;Body mechanics;Proprioception    Cognitive Skills Attention;Memory    Rehab Potential Good    Clinical Decision Making Several treatment options, min-mod task modification necessary    Comorbidities Affecting Occupational Performance: May have comorbidities impacting occupational performance    Modification or Assistance to Complete Evaluation  Min-Moderate modification of tasks or assist  with assess necessary to complete eval    OT Frequency 2x / week    OT Duration 8 weeks   +eval (delayed start in scheduling), may only need 6 weeks depending on progress   OT Treatment/Interventions Self-care/ADL training;Moist Heat;DME and/or AE instruction;Balance training;Therapeutic activities;Aquatic Therapy;Cognitive remediation/compensation;Therapeutic exercise;Cryotherapy;Energy conservation;Manual Therapy;Patient/family education;Passive range of motion;Functional Mobility Training;Neuromuscular education    Plan **Progress note next visit;  education re:  memory  strategies/Keeping thinking skills sharp    OT Home Exercise Plan educated provided:  PWR! seated; PWR! hands; coordination HEP, handwriting strategies    Consulted and Agree with Plan of Care Patient             Patient will benefit from skilled therapeutic intervention in order to improve the following deficits and impairments:   Body Structure / Function / Physical Skills: ADL, Balance, Decreased knowledge of use of DME, Tone, GMC, Dexterity, UE functional use, IADL, FMC, Coordination, Mobility, Improper spinal/pelvic alignment, Body mechanics, Proprioception Cognitive Skills: Attention, Memory     Visit Diagnosis: Other lack of coordination  Other symptoms and signs involving the musculoskeletal system  Other symptoms and signs involving the nervous system  Tremor  Abnormal posture  Unsteadiness on feet  Attention and concentration deficit  Other abnormalities of gait and mobility    Problem List Patient Active Problem List   Diagnosis Date Noted   SBO (small bowel obstruction) ([place]) 07/06/2019    [facility] 11/01/2020, 11:35 AM  [place] [address] [place], [place], [address] Phone: [phone]   Fax:  [phone]  Name: Cody Dickerson MRN: [id number] Date of Birth: 1950-10-01  Vianne Bulls, OTR/L [facility] [address] [place], [place]  [address] [phone] phone [phone] 11/01/20 11:35 AM

## 2020-11-06 ENCOUNTER — Other Ambulatory Visit: Payer: Self-pay

## 2020-11-06 ENCOUNTER — Ambulatory Visit: Payer: Medicare Other | Admitting: Occupational Therapy

## 2020-11-06 DIAGNOSIS — R293 Abnormal posture: Secondary | ICD-10-CM | POA: Diagnosis not present

## 2020-11-06 DIAGNOSIS — R251 Tremor, unspecified: Secondary | ICD-10-CM | POA: Diagnosis not present

## 2020-11-06 DIAGNOSIS — R278 Other lack of coordination: Secondary | ICD-10-CM | POA: Diagnosis not present

## 2020-11-06 DIAGNOSIS — R29898 Other symptoms and signs involving the musculoskeletal system: Secondary | ICD-10-CM

## 2020-11-06 DIAGNOSIS — R2681 Unsteadiness on feet: Secondary | ICD-10-CM | POA: Diagnosis not present

## 2020-11-06 DIAGNOSIS — R29818 Other symptoms and signs involving the nervous system: Secondary | ICD-10-CM | POA: Diagnosis not present

## 2020-11-06 NOTE — Patient Instructions (Addendum)
Memory Compensation Strategies  Use "WARM" strategy.  W= write it down  A= associate it  R= repeat it  M= make a mental note  2.   You can keep a Social worker.  Use a 3-ring notebook with sections for the following: calendar, important names and phone numbers,  medications, doctors' names/phone numbers, lists/reminders, and a section to journal what you did  each day.   3.    Use a calendar to write appointments down.  4.    Write yourself a schedule for the day.  This can be placed on the calendar or in a separate section of the Memory Notebook.  Keeping a regular schedule can help memory.  5.    Use medication organizer with sections for each day or morning/evening pills.  You may need help loading it  6.    Keep a basket, or pegboard by the door.  Place items that you need to take out with you in the basket or on the pegboard.  You may also want to  include a message board for reminders.  7.    Use sticky notes.  Place sticky notes with reminders in a place where the task is performed.  For example: " turn off the  stove" placed by the stove, "lock the door" placed on the door at eye level, " take your medications" on  the bathroom mirror or by the place where you normally take your medications.  8.    Use alarms/timers.  Use while cooking to remind yourself to check on food or as a reminder to take your medicine, or as a  reminder to make a call, or as a reminder to perform another task, etc.     Keeping Thinking Skills Sharp: 1. Jigsaw puzzles 2. Card/board games 3. Talking on the phone/social events 4. Lumosity.com 5. Online games 6. Word serches/crossword puzzles 7.  Logic puzzles 8. Aerobic exercise (stationary bike) 9. Eating balanced diet (fruits & veggies) 10. Drink water 11. Try something new--new recipe, hobby 12. Crafts 13. Do a variety of activities that are challenging

## 2020-11-06 NOTE — Therapy (Signed)
[place] [address] [place], [place], [address] Phone: [phone]   Fax:  [phone]  Occupational Therapy Treatment  Patient Details  Name: Cody Dickerson MRN: [id number] Date of Birth: 09-10-1950 Referring Provider (OT): Dr. Wells Guiles Tat   Encounter Date: 11/06/2020   OT End of Session - 11/06/20 1253     Visit Number 10    Number of Visits 17    Date for OT Re-Evaluation 11/30/20    Authorization Type Medicare & [place], covered at 100%    Authorization - Visit Number 10    Authorization - Number of Visits 10    Progress Note Due on Visit 10    OT Start Time 1018    OT Stop Time 1100    OT Time Calculation (min) 42 min    Activity Tolerance Patient tolerated treatment well    Behavior During Therapy [facility] for tasks assessed/performed             Past Medical History:  Diagnosis Date   Balance problem    Gout    Hypercholesterolemia    Hypertension    Small bowel obstruction [facility])     Past Surgical History:  Procedure Laterality Date   BOWEL BLOCKAGE  05/2019    There were no vitals filed for this visit.   Pain: Pt denies pain today       Treatment: Activities for dual tasking and dynamic step and reach to toss scarves towards target, and to flipp large playing cards, min difficulty, min-mod v.c for amplitude.               OT Education - 11/06/20 1246     Education Details Education regarding memory compensations and keeping thinking skills sharp, reviewed strategy for donning doffing jacket with big movments and trunk rotation, min v.c then pt returned demonstration.    Person(s) Educated Patient    Methods Explanation;Handout    Comprehension Verbalized understanding;Returned demonstration              OT Short Term Goals - 11/06/20 1031       OT SHORT TERM GOAL #1   Title Pt will be independent with PD-specific HEP--check STGs 10/30/20    Time 4    Period Weeks     Status Achieved      OT SHORT TERM GOAL #2   Title Pt will verbalize understanding of ways to decr risk of future complications related to PD.    Time 4    Period Weeks    Status Achieved      OT SHORT TERM GOAL #3   Title Pt will report incr ease in using LUE to assist in washing hair.    Time 4    Period Weeks    Status On-going   11/01/20:  not fully met     OT SHORT TERM GOAL #4   Title Pt will don/doff jacket using large amplitude movement strategies (feet apart, associated trunk movements) for incr ease and safety.    Time 4    Period Weeks    Status Achieved   11/06/20-met, pt returned demonstration              OT Long Term Goals - 11/06/20 1032       OT LONG TERM GOAL #1   Title Pt will verbalize understanding of adaptive strategies for ADLs/IADLs to incr ease/safety, and decr risk of future complications.--check LTGs 11/30/20    Time 8  Period Weeks    Status On-going      OT LONG TERM GOAL #2   Title Pt will verbalize understanding of memory compensation strategies and ways to keep thinking skills sharp.    Time 8    Period Weeks    Status Achieved   11/06/20-met     OT LONG TERM GOAL #3   Title Pt will improve bilateral hand coordination and ability to dress as shown by fastening/unfastening 3 buttons in less than or equal to 40sec.    Baseline 57.12sec    Time 8    Period Weeks    Status Achieved      OT LONG TERM GOAL #4   Title Pt improve functional reach and coordination as shown by improving box and block score by at least 3 blocks bilateral.    Baseline R-44 blocks, L-47 blocks    Time 8    Period Weeks    Status On-going      OT LONG TERM GOAL #5   Title Pt will verbalize understanding of PD related community resources.    Time 8    Period Weeks    Status On-going                   Plan - 11/06/20 1248     Clinical Impression Statement For the reporting period of 09/18/20- 11/06/20, pt demonstrates good overall progress he has  achieved 4/5 short term goals. He is progressing towards all remaining  goals  with improved movement amplitude, but continues to need cueing particularly with multi tasking. Pt can benefit from continued skilled occupational therapy to address the following deficits: bradykinesia, decreased coordination, decreased balance, decreased functional mobility in order to maximize pt's safety and I with ADLS/ IADLS.    OT Occupational Profile and History Detailed Assessment- Review of Records and additional review of physical, cognitive, psychosocial history related to current functional performance    Occupational performance deficits (Please refer to evaluation for details): ADL's;IADL's;Work;Leisure    Body Structure / Function / Physical Skills ADL;Balance;Decreased knowledge of use of DME;Tone;GMC;Dexterity;UE functional use;IADL;FMC;Coordination;Mobility;Improper spinal/pelvic alignment;Body mechanics;Proprioception    Cognitive Skills Attention;Memory    Rehab Potential Good    Clinical Decision Making Several treatment options, min-mod task modification necessary    Comorbidities Affecting Occupational Performance: May have comorbidities impacting occupational performance    Modification or Assistance to Complete Evaluation  Min-Moderate modification of tasks or assist with assess necessary to complete eval    OT Frequency 2x / week    OT Duration 8 weeks   +eval (delayed start in scheduling), may only need 6 weeks depending on progress   OT Treatment/Interventions Self-care/ADL training;Moist Heat;DME and/or AE instruction;Balance training;Therapeutic activities;Aquatic Therapy;Cognitive remediation/compensation;Therapeutic exercise;Cryotherapy;Energy conservation;Manual Therapy;Patient/family education;Passive range of motion;Functional Mobility Training;Neuromuscular education    Plan continue to work towards unmet goals, dynamic step and reach    [place] educated provided:  PWR!  seated; PWR! hands; coordination HEP, handwriting strategies    Consulted and Agree with Plan of Care Patient             Patient will benefit from skilled therapeutic intervention in order to improve the following deficits and impairments:   Body Structure / Function / Physical Skills: ADL, Balance, Decreased knowledge of use of DME, Tone, GMC, Dexterity, UE functional use, IADL, FMC, Coordination, Mobility, Improper spinal/pelvic alignment, Body mechanics, Proprioception Cognitive Skills: Attention, Memory     Visit Diagnosis: Other lack of coordination  Other symptoms  and signs involving the musculoskeletal system  Other symptoms and signs involving the nervous system  Tremor  Abnormal posture    Problem List Patient Active Problem List   Diagnosis Date Noted   SBO (small bowel obstruction) ([place]) 07/06/2019    Cody Dickerson 11/06/2020, 12:55 PM  [place] [address] [place], [place], [address] Phone: [phone]   Fax:  [phone]  Name: Cody Dickerson MRN: [id number] Date of Birth: June 11, 1950

## 2020-11-07 ENCOUNTER — Encounter: Payer: Self-pay | Admitting: Counselor

## 2020-11-07 ENCOUNTER — Ambulatory Visit (INDEPENDENT_AMBULATORY_CARE_PROVIDER_SITE_OTHER): Payer: Medicare Other | Admitting: Counselor

## 2020-11-07 ENCOUNTER — Ambulatory Visit: Payer: Medicare Other | Admitting: Psychology

## 2020-11-07 DIAGNOSIS — G3184 Mild cognitive impairment, so stated: Secondary | ICD-10-CM

## 2020-11-07 DIAGNOSIS — G20 Parkinson's disease: Secondary | ICD-10-CM

## 2020-11-07 DIAGNOSIS — F09 Unspecified mental disorder due to known physiological condition: Secondary | ICD-10-CM

## 2020-11-07 NOTE — Progress Notes (Signed)
[facility]  Patient Name: BART BARMES MRN: [id number] Date of Birth: November 22, 1950 Age: 70 y.o. Education: 16 years  Referral Circumstances and Background Information  Mr. Rogers Regimbal is a 70 y.o., right-hand dominant, married man with a history of HTN, hypercholesterolemia, and movement changes that started with tremor approximately three years ago and more in the way of bradykinesia and possible Parkinsonistic features recently. He consulted with Dr. Carles Collet in 2019 for his tremor, underwent DaT scan (which was essentially normal) and then did not return for some time. Recently, he re-presented and seemed to have more clinical features of Parkinsonism and also complained of memory problems, prompting a referral to neuropsychology.    On interview, the patient reported that he first noticed problems with memory and thinking "probably several years ago." The problems started with difficulties multitasking the way that he did in the past. He thinks these difficulties have been worsening over time. In terms of spontaneous complaints, he is mainly having problems with attention and concentration, such as getting side tracked when he is doing things. It takes him longer than it should to finish things. He will go to look up things and will then forget what he intended to look up. He will also forget things/doesn't encode well, he will take his blood pressure and then he will forget what it was quickly. He has had some difficulties paying attention to things for some time. His wife generally corroborated the history he provided. She added that he has had difficulties with word finding for almost 10 years. His wife also notices that he will have phonologic paraphasia at times. She also notices bradyphrenia and that he "doesn't seem quite all the way there" in the mornings. 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 He denied that he is having problems at work as a result of his memory and thinking difficulties, his wife will overhear him on the phone in the afternoon and "he sounds like he knows what he is talking about." They are denying much in the way of problems with memory, repeating himself or the like. He feels like he is not as good at planning as he used to be, his wife doesn't notice that and he had a hard time defining what he meant. His wife said that he does have a schedule and is highly routine oriented. It sounds like he is doing fine with decision making and problem solving.   With respect to mood, the patient is reporting that he feels good now and his wife agreed. I reminded him that he endorsed depression previously and started on Lexapro, and it sounds like he was getting down about his health issues at that time. He only took the medication for several days and then stopped. He reported that his energy was "terrible" when he took the lexapro and is better now, although he does get drops in his blood pressure (he wonders if it's the sinemet). He reported that he doesn't notice much change on the sinemet, he can't tell if it's helping. His appetite is normal for him and his weight is relatively stable. He is sleeping about 7 hours he thinks. They do not notice any dream enactment.   With respect to driving, he feels as though he has to focus more when he used to. His wife has noticed some  minor changes, as though he is a bit distracted. Neither of them are concerned, however, and they denied any getting lost, accidents, or other concerning signs. He was playing golf, but he stopped during [place], he felt like his hand eye coordination was so bad that he needed to stop. 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 He does feel like the spin class is helpful, he is doing that on  Fridays. He does get OT through the neuro rehab center, twice a week. He isn't sure of the PT and OT helped. Apart from the above, there are no changes in his functioning. They largely denied any issues at home or with hobbies, with his orientation, or functioning in the community. He attends to his hygiene as usual and does not need any help with basic ADLs.   Past Medical History and Review of Relevant Studies   Patient Active Problem List   Diagnosis Date Noted   SBO (small bowel obstruction) ([place]) 07/06/2019   Review of Neuroimaging and Relevant Medical History: The patient had a small bowel obstruction that was operated on last year. Apparently, he presented as a bit altered and "everything was slower" according to his wife but it does not sound like he was frankly delirious. He has no history of strokes or seizures.   Patient being followed by Dr. Carles Collet for Parkinsonism, he is being treated with sinemet. He has decrementation with RAMs, decreased stride length and arm swing bilaterally on exam.   Reviewed CT head images from CT Angiogram, which showed mild cerebral and cerebellar volume loss.   Current Outpatient Medications  Medication Sig Dispense Refill   amLODipine (NORVASC) 2.5 MG tablet Take 4 tablets (10 mg total) by mouth daily. 112 tablet 1   carbidopa-levodopa (SINEMET IR) 25-100 MG tablet TAKE 1 TABLET BY MOUTH THREE TIMES DAILY (7AM, 11AM,4PM) 270 tablet 1   indomethacin (INDOCIN) 50 MG capsule 3 (three) times daily as needed.      Multiple Vitamin (MULTIVITAMIN) tablet Take 1 tablet by mouth daily.     rosuvastatin (CRESTOR) 20 MG tablet Take 20 mg by mouth daily.   4   valsartan (DIOVAN) 320 MG tablet 1 tablet     vitamin B-12 (CYANOCOBALAMIN) 500 MCG tablet Take 500 mcg by mouth daily.     No current facility-administered medications for this visit.   Family History  Problem Relation Age of Onset   Hypertension Mother    Arthritis Father    Prostate cancer Brother     There is a family history of dementia. The patient had a maternal grandmother and a maternal aunt who had Alzheimer's. He reported they were in their 49s when they became symptomatic. There is no  family history of psychiatric illness.  Psychosocial History  Developmental, Educational and Employment History: The patient is a native of [place] and denied any abuse or neglect during childhood. The patient reported that he was never held back, was a good student who graduated with honors, and had no learning problems. He went to [employer] at the time) and earned a Dietitian in Investment banker, corporate. 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 He worked at [employer] for many years. 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 He partially retired around the time of the pandemic and  quit traveling then.   Psychiatric History: None  Substance Use History: The patient used to enjoy drinking, he would have a beer or two most days and up to 6 on a Friday or Saturday. He hasn't drank more than occasionally over the past 10 years or so.   Relationship History and Living Cimcumstances: The patient and his wife have been married for 33 years. They have two children, one in [place] and one in [place].   Mental Status and Behavioral Observations  Sensorium/Arousal: The patient's level of arousal was awake and alert. Hearing and vision were adequate for testing purposes. Orientation: The patient was oriented to person, place, time, situation, and was aware of the current and past [place] presidents.  Appearance: Dressed neatly in appropriate, casual clothing Behavior: Pleasant, appropriate Speech/language: Normal in rate, rhythm, volume and prosody Gait/Posture: Not formally examined, had reduced arm swing and stride length for Dr. Carles Collet Movement: Perhaps some bradykinesia/hypokinesia.  Also had a bit of a stooped posture at times. No rest tremor noted.  Social Comportment: Pleasant and appropriate Mood: "Good" Affect: Mainly euthymic Thought process/content: Thought process was logical and goal oriented, he had no difficulties providing a fairly coherent personal history and timeline. Thought content was appropriate to the topics discussed.  Safety: No safety concerns identified in this euthymic individual Insight: [place] Cognitive Assessment  11/07/2020  Visuospatial/ Executive (0/5) 2  Naming (0/3) 3  Attention: Read list of digits (0/2) 2  Attention: Read list of letters (0/1) 1  Attention: Serial 7 subtraction starting at 100 (0/3) 3  Language: Repeat phrase (0/2) 2  Language : Fluency (0/1) 0  Abstraction (0/2) 2  Delayed Recall (0/5) 3  Orientation (0/6) 6  Total 24  Adjusted Score (based on education) 24   Test Procedures  Wide Range Achievement Test - 4             Word Reading Neuropsychological Assessment Battery  List Learning  Story Learning  Daily Living Memory  Naming  Digit Span Repeatable Battery for the Assessment of Neuropsychological Status (Form A)  Figure Copy  Judgment of Line Orientation  Coding  Figure Recall The Dot Counting Test A Random Letter Test Controlled Oral Word Association (F-A-S) Semantic Fluency (Animals) Trail Making Test A & B Complex Ideational Material Modified [place] Card Sorting Test Geriatric Depression Scale - Short Form Quick Dementia Rating System (completed by wife, Morada)  Plan  CAILAN STUDENT was seen for a psychiatric diagnostic evaluation and neuropsychological testing. He is a pleasant, 70 year old, right-hand dominant married man with a history of problems with memory and thinking that started several years ago as per him. He is noticing mainly difficulties with inattentiveness but is still functioning well. His wife notices some of the same although in general presented his cognitive  problems as less concerning than the patient did. His performance on the MoCA is consistent with an MCI level problem at most, detailed testing will be helpful to better ferret out the nature of his issues. Full and complete note with impressions, recommendations, and interpretation of test data to follow.   Viviano Simas Nicole Kindred, PsyD, [place] Clinical Neuropsychologist  Informed Consent  Risks and benefits of the evaluation were discussed with the patient prior to all testing procedures. I conducted a clinical interview and neuropsychological testing (at least two tests) with Juan Quam and Milana Kidney, B.S. (Technician) administered additional test procedures. The patient was able to tolerate the testing procedures and the patient (and/or family  if applicable) is likely to benefit from further follow up to receive the diagnosis and treatment recommendations, which will be rendered at the next encounter.

## 2020-11-07 NOTE — Progress Notes (Signed)
   Psychometrist Note   Cognitive testing was administered to Cody Dickerson by Milana Kidney, B.S. (Technician) under the supervision of Alphonzo Severance, Psy.D., ABN. [name] was able to tolerate all test procedures. Dr. Nicole Kindred met with the patient as needed to manage any emotional reactions to the testing procedures. Rest breaks were offered.    The battery of tests administered was selected by Dr. Nicole Kindred with consideration to the patient's current level of functioning, the nature of his symptoms, emotional and behavioral responses during the interview, level of literacy, observed level of motivation/effort, and the nature of the referral question. This battery was communicated to the psychometrist. Communication between Dr. Nicole Kindred and the psychometrist was ongoing throughout the evaluation and Dr. Nicole Kindred was immediately accessible at all times. Dr. Nicole Kindred provided supervision to the technician on the date of this service, to the extent necessary to assure the quality of all services provided.    Mr. Coppola will return in approximately one week for an interactive feedback session with Dr. Nicole Kindred, at which time test performance, clinical impressions, and treatment recommendations will be reviewed in detail. The patient understands he can contact our office should he require our assistance before this time.   A total of 95 minutes of billable time were spent with Cody Dickerson by the technician, including test administration and scoring time. Billing for these services is reflected in Dr. Les Pou note.   This note reflects time spent with the psychometrician and does not include test scores, clinical history, or any interpretations made by Dr. Nicole Kindred. The full report will follow in a separate note.

## 2020-11-08 ENCOUNTER — Encounter: Payer: Self-pay | Admitting: Occupational Therapy

## 2020-11-08 ENCOUNTER — Ambulatory Visit: Payer: Medicare Other | Admitting: Occupational Therapy

## 2020-11-08 ENCOUNTER — Other Ambulatory Visit: Payer: Self-pay

## 2020-11-08 DIAGNOSIS — R2681 Unsteadiness on feet: Secondary | ICD-10-CM

## 2020-11-08 DIAGNOSIS — R29818 Other symptoms and signs involving the nervous system: Secondary | ICD-10-CM

## 2020-11-08 DIAGNOSIS — R293 Abnormal posture: Secondary | ICD-10-CM

## 2020-11-08 DIAGNOSIS — R251 Tremor, unspecified: Secondary | ICD-10-CM

## 2020-11-08 DIAGNOSIS — R278 Other lack of coordination: Secondary | ICD-10-CM

## 2020-11-08 DIAGNOSIS — R29898 Other symptoms and signs involving the musculoskeletal system: Secondary | ICD-10-CM

## 2020-11-08 NOTE — Progress Notes (Signed)
[facility]  Patient Name: Cody Dickerson MRN: [id number] Date of Birth: 05/31/1950 Age: 70 y.o. Education: 16 years  Measurement properties of test scores: IQ, Index, and Standard Scores (SS): Mean = 100; Standard Deviation = 15 Scaled Scores (Ss): Mean = 10; Standard Deviation = 3 Z scores (Z): Mean = 0; Standard Deviation = 1 T scores (T); Mean = 50; Standard Deviation = 10  TEST SCORES:    Note: This summary of test scores accompanies the interpretive report and should not be interpreted by unqualified individuals or in isolation without reference to the report. Test scores are relative to age, gender, and educational history as available and appropriate.   Performance Validity        "A" Random Letter Test Raw  Descriptor      Errors 0 Within Expectation  The Dot Counting Test: 15 Within Expectation  NAB Effort Index 0 Within Expectation      Mental Status Screening     Total Score Descriptor  MoCA 24 Normal      Expected Functioning        Wide Range Achievement Test: Standard/Scaled Score Percentile      Word Reading 104 61      Reynolds Intellectual Screening Test Standard/T-score Percentile      Guess What 57 75      Odd Item Out 33 5  RIST Index 93 32      Attention/Processing Speed        Neuropsychological Assessment Battery (Attention Module, Form 1): T-score Percentile      Digits Forward 44 27      Digits Backwards 32 4      Repeatable Battery for the Assessment of Neuropsychological Status (Form A): Scaled Score Percentile      Coding 3 1      Language        Neuropsychological Assessment Battery (Language Module, Form 1): T-score Percentile      Naming   (31) 55 69      Verbal Fluency: T-score Percentile      Controlled Oral Word Association (F-A-S) 37 9      Semantic Fluency (Animals) 41 18      Memory:        Neuropsychological Assessment Battery (Memory Module, Form 1): T-score Percentile       List Learning           List A Immediate Recall   (3, 6, 8) 37 9         List B Immediate Recall   (1) 25 1         List A Short Delayed Recall   (7) 48 42         List A Long Delayed Recall   (7) 49 46         List A Percent Retention   (100 %) --- 54         List A Long Delayed Yes/No Recognition Hits   (8) --- 3         List A Long Delayed Yes/No Recognition False Alarms   (2) --- 66         List A Recognition Discriminability Index --- 34      Story Learning           Immediate Recall   (16, 27) 33 5         Delayed Recall   (30) 46 34  Percent Retention   (111 %) --- 86      Daily Living Memory            Immediate Recall   (18, 14) 33 5          Delayed Recall   (3, 3) 19 <1          Percent Retention (46 %) --- <1          Recognition Hits    (6) --- 2      Repeatable Battery for the Assessment of Neuropsychological Status (Form A): Scaled Score Percentile         Figure Recall   (12) 9 37      Visuospatial/Constructional Functioning        Repeatable Battery for the Assessment of Neuropsychological Status (Form A): Standard/Scaled Score Percentile     Visuospatial/Constructional Index 78 7         Figure Copy   (16) 6 9         Judgment of Line Orientation   (14) --- 17-25      Executive Functioning        Modified [employer] Test (MWCST): Standard/T-Score Percentile      Number of Categories Correct 56 73      Number of Perseverative Errors 49 46      Number of Total Errors 48 42      Percent Perseverative Errors 49 46  Executive Function Composite 104 61      Trail Making Test: T-Score Percentile      Part A 37 9      Part B 44 27      Boston Diagnostic Aphasia Exam: Raw Score Scaled Score      Complex Ideational Material 11 9      Clock Drawing Raw Score Descriptor      Command 8 Borderline Impairment      Rating Scales        Clinical Dementia Rating Raw Score Descriptor      Sum of Boxes 1.0 MCI      Global Score 0.0 Normal       Quick Dementia Rating System Raw Score Descriptor      Sum of Boxes 2.5 Very Mild Dementia      Total Score 3 MCI  Geriatric Depression Scale - Short Form 3 Negative    Navayah Sok V. Nicole Kindred PsyD, [place] Clinical Neuropsychologist

## 2020-11-08 NOTE — Therapy (Signed)
[facility] [address] [place], [place], [address] Phone: [phone]   Fax:  [phone]  Occupational Therapy Treatment  Patient Details  Name: Cody Dickerson MRN: [id number] Date of Birth: 04-21-1950 Referring Provider (OT): Dr. Wells Guiles Tat   Encounter Date: 11/08/2020   OT End of Session - 11/08/20 1005     Visit Number 11    Number of Visits 17    Date for OT Re-Evaluation 11/30/20    Authorization Type Medicare & AARP, covered at 100%    Authorization - Visit Number 11    Authorization - Number of Visits 10    Progress Note Due on Visit 10    OT Start Time [phone]    OT Stop Time 1015    OT Time Calculation (min) 38 min    Activity Tolerance Patient tolerated treatment well    Behavior During Therapy [facility] for tasks assessed/performed             Past Medical History:  Diagnosis Date   Balance problem    Gout    Hypercholesterolemia    Hypertension    Small bowel obstruction [facility])     Past Surgical History:  Procedure Laterality Date   BOWEL BLOCKAGE  05/2019    There were no vitals filed for this visit.   Subjective Assessment - 11/08/20 0937     Subjective  Denies pain    Pertinent History Parkinson's Disease.  PMH:  Hypercholesterolemia, HTN, hx of gout, hx small bowel blockage.    Currently in Pain? No/denies                           Treatment: Dynamic step and reach, to copy peg design with right and left UE's alternately to address cognition, functional reach, and fine motor coordination, min v.c for amplitude. Increased time required.       OT Treatment/Education - 11/08/20 1001     Education Details PWR! moves basic 4 in modified quadraped 10 reps each, min v.c initally for amplitude and positioning, then pt returned demonstraion. Informaion provided regarding community resources for PD- hanout issued.    Person(s) Educated Patient    Methods  Explanation;Handout;Demonstration;Verbal cues    Comprehension Verbalized understanding;Returned demonstration              OT Short Term Goals - 11/06/20 1031       OT SHORT TERM GOAL #1   Title Pt will be independent with PD-specific HEP--check STGs 10/30/20    Time 4    Period Weeks    Status Achieved      OT SHORT TERM GOAL #2   Title Pt will verbalize understanding of ways to decr risk of future complications related to PD.    Time 4    Period Weeks    Status Achieved      OT SHORT TERM GOAL #3   Title Pt will report incr ease in using LUE to assist in washing hair.    Time 4    Period Weeks    Status On-going   11/01/20:  not fully met     OT SHORT TERM GOAL #4   Title Pt will don/doff jacket using large amplitude movement strategies (feet apart, associated trunk movements) for incr ease and safety.    Time 4    Period Weeks    Status Achieved   11/06/20-met, pt returned demonstration  OT Long Term Goals - 11/08/20 1004       OT LONG TERM GOAL #1   Title Pt will verbalize understanding of adaptive strategies for ADLs/IADLs to incr ease/safety, and decr risk of future complications.--check LTGs 11/30/20    Time 8    Period Weeks    Status On-going      OT LONG TERM GOAL #2   Title Pt will verbalize understanding of memory compensation strategies and ways to keep thinking skills sharp.    Time 8    Period Weeks    Status Achieved   11/06/20-met     OT LONG TERM GOAL #3   Title Pt will improve bilateral hand coordination and ability to dress as shown by fastening/unfastening 3 buttons in less than or equal to 40sec.    Baseline 57.12sec    Time 8    Period Weeks    Status Achieved      OT LONG TERM GOAL #4   Title Pt improve functional reach and coordination as shown by improving box and block score by at least 3 blocks bilateral.    Baseline R-44 blocks, L-47 blocks    Time 8    Period Weeks    Status On-going      OT LONG TERM GOAL #5    Title Pt will verbalize understanding of PD related community resources.    Time 8    Period Weeks    Status Achieved   hanout issued 11/08/20                  Plan - 11/08/20 1006     Clinical Impression Statement Pt demonstrates good overall progress. He has met most of his short term goals and is progressing towards remaining goals.    OT Occupational Profile and History Detailed Assessment- Review of Records and additional review of physical, cognitive, psychosocial history related to current functional performance    Occupational performance deficits (Please refer to evaluation for details): ADL's;IADL's;Work;Leisure    Body Structure / Function / Physical Skills ADL;Balance;Decreased knowledge of use of DME;Tone;GMC;Dexterity;UE functional use;IADL;FMC;Coordination;Mobility;Improper spinal/pelvic alignment;Body mechanics;Proprioception    Cognitive Skills Attention;Memory    Rehab Potential Good    Clinical Decision Making Several treatment options, min-mod task modification necessary    Comorbidities Affecting Occupational Performance: May have comorbidities impacting occupational performance    Modification or Assistance to Complete Evaluation  Min-Moderate modification of tasks or assist with assess necessary to complete eval    OT Frequency 2x / week    OT Duration 8 weeks   +eval (delayed start in scheduling), may only need 6 weeks depending on progress   OT Treatment/Interventions Self-care/ADL training;Moist Heat;DME and/or AE instruction;Balance training;Therapeutic activities;Aquatic Therapy;Cognitive remediation/compensation;Therapeutic exercise;Cryotherapy;Energy conservation;Manual Therapy;Patient/family education;Passive range of motion;Functional Mobility Training;Neuromuscular education    Plan anticipate d/c next weekk, reinforce ADL strategeis, check goals, dynamic step and reach    OT Home Exercise Plan educated provided:  PWR! seated; PWR! hands; coordination  HEP, handwriting strategies    Consulted and Agree with Plan of Care Patient             Patient will benefit from skilled therapeutic intervention in order to improve the following deficits and impairments:   Body Structure / Function / Physical Skills: ADL, Balance, Decreased knowledge of use of DME, Tone, GMC, Dexterity, UE functional use, IADL, FMC, Coordination, Mobility, Improper spinal/pelvic alignment, Body mechanics, Proprioception Cognitive Skills: Attention, Memory     Visit Diagnosis: Other lack of coordination  Other symptoms and signs involving the musculoskeletal system  Other symptoms and signs involving the nervous system  Tremor  Abnormal posture  Unsteadiness on feet    Problem List Patient Active Problem List   Diagnosis Date Noted   SBO (small bowel obstruction) ([place]) 07/06/2019    Jadien Lehigh, OT/L 11/08/2020, 10:08 AM  [facility] [address] [place], [place], [address] Phone: [phone]   Fax:  [phone]  Name: STOKELY JEANCHARLES MRN: [id number] Date of Birth: 13-Sep-1950

## 2020-11-12 NOTE — Progress Notes (Signed)
[facility]  Patient Name: LAETON REI MRN: [id number] Date of Birth: [birth year] Age: 70 y.o. Education: 34 years  Clinical Impressions  DYLON JERMAN is a 70 y.o., right-hand dominant, married man with a history of Parkinsonism (and possibly PD) currently following with Dr. Carles Collet. My thanks to Dr. Carles Collet for her thorough yet succinct documentation. He is complaining of memory problems that have affected him over the past several years, presenting mainly with attention/concentration/executive control difficulties. He specifically notices problems with multitasking, taking longer than in the past to recall things and poor memory encoding. He is still working and has no real functional impairment in any area, though he may be less efficient and there are slight changes.   On neuropsychological assessment, Mr. Tiffany demonstrated less than expected performance in several areas supporting an impression of frontal-subcortical system dysfunction. Specifically, he demonstrated impairment memory encoding and variable delayed free recall in the absence of storage difficulties, extremely low processing speed, and difficulties on measures of selective attention and working memory. He did perform at an unusually low level overall on visuospatial/constructional indicators and also had difficulty with a matrix reasoning type task that is designed to assess prior ability but can also be affected by cognitive decline. He screened negative for the presence of depression and is at an MCI level of function as per his CDR and his wife's rating of his function.   Mr. Rogan performance is thus consistent with an MCI level cognitive problem characterized mainly by frontal-subcortical difficulties with consideration of possible emerging visuospatial/constructional dysfunction. These findings would go well with MCI in Parkinson's disease and they are also highly concordant with his  self-report of problems. Vascular disease could also be a factor although there did not appear to be any areas of significant vascular change on his head CT and that wouldn't explain the visuospatial changes as well. I do not think that there are any findings to implicate AD as a likely cause of his problem and he will be reassured. Concur with the lifestyle changes he has already implemented at the recommendation of Dr. Carles Collet. Will counsel him about dietary strategies for maintaining cognitive longevity, compensation, and literature that may be helpful. He can follow up for repeat evaluation in 18 to 24 months or sooner as clinically indicated/desired.  Diagnostic Impressions: Mild cognitive impairment Parkinsonism  Recommendations to be discussed with patient  Your performance and presentation on neuropsychological assessment were consistent with less than expected performance in several different areas. The main difficulty you had was on measures of so-called "frontal-subcortical abilities," including working memory, processing speed, and attention. These sorts of problems can often affect memory functioning and result in her perception of memory problems, by reducing encoding of information (i.e., ability to get information in). He also had a marginal finding on measures of visuospatial/constructional abilities, this could represent a decline although it may also represent testing artifact because it is not very abnormal. The best diagnosis at this point in time is mild cognitive impairment.    The major difference between mild cognitive impairment (MCI) and dementia is in severity and potential prognosis. Once someone reaches a level of severity adequate to be diagnosed with a dementia, there is usually progression over time, though this may be years. On the other hand, mild cognitive impairment, while a significant risk for dementia in future, does not always progress to dementia, and in some instances  stays the same or can even revert to normal. It is important  to realize that if MCI is due to underlying Alzheimer's disease, it will most likely progress to dementia eventually. The rate of conversion to Alzheimer's dementia from amnestic MCI is about 15% per year versus the general population risk of conversion of 2% per year.   Case, your mild cognitive impairment does not look like it is due to Alzheimer's disease. This is good! In fact, you had good retention of information across time in most cases on memory measures, which tends to be the earliest and most sensitive indicator of the type of changes we expect to see in Alzheimer's. Parkinson's disease involves frontal-subcortical system dysfunction, and seems to be your working diagnosis as per my interpretation of Dr. Arturo Morton notes. Your test data go well with Parkinson's disease as a possible cause.   In terms of treatment, I think it is extremely important to continue with the lifestyle changes that you are already doing under Dr. Doristine Devoid direction (e.g., spinning, exercise, PT/OT). I also suggest that you consider dietary changes and compensatory strategies.  There is now good quality evidence from at least one large scale study that a modified mediterranean diet may help slow cognitive decline. This is known as the "MIND" diet. The Mind diet is not so much a specific diet as it is a set of recommendations for things that you should and should not eat.   Foods that are ENCOURAGED on the MIND Diet:  Green, leafy vegetables: Aim for six or more servings per week. This includes kale, spinach, cooked greens and salads.  All other vegetables: Try to eat another vegetable in addition to the green leafy vegetables at least once a day. It is best to choose non-starchy vegetables because they have a lot of nutrients with a low number of calories.  Berries: Eat berries at least twice a week. There is a plethora of research on strawberries, and other berries  such as blueberries, raspberries and blackberries have also been found to have antioxidant and brain health benefits.  Nuts: Try to get five servings of nuts or more each week. The creators of the [place] don't specify what kind of nuts to consume, but it is probably best to vary the type of nuts you eat to obtain a variety of nutrients. Peanuts are a legume and do not fall into this category.  Olive oil: Use olive oil as your main cooking oil. There may be other heart-healthy alternatives such as algae oil, though there is not yet sufficient research upon which to base a formal recommendation.  Whole grains: Aim for at least three servings daily. Choose minimally processed grains like oatmeal, quinoa, brown rice, whole-wheat pasta and 100% whole-wheat bread.  Fish: Eat fish at least once a week. It is best to choose fatty fish like salmon, sardines, trout, tuna and mackerel for their high amounts of omega-3 fatty acids.  Beans: Include beans in at least four meals every week. This includes all beans, lentils and soybeans.  Poultry: Try to eat chicken or [place] at least twice a week. Note that fried chicken is not encouraged on the MIND diet.  Wine: Aim for no more than one glass of alcohol daily. Both red and white wine may benefit the brain. However, much research has focused on the red wine compound resveratrol, which may help protect against Alzheimer's disease.  Foods that are DISCOURAGED on the MIND Diet: Butter and margarine: Try to eat less than 1 tablespoon (about 14 grams) daily. Instead, try using olive oil  as your primary cooking fat, and dipping your bread in olive oil with herbs.  Cheese: The MIND diet recommends limiting your cheese consumption to less than once per week.  Red meat: Aim for no more than three servings each week. This includes all beef, pork, lamb and products made from these meats.  [place] food: The MIND diet highly discourages fried food, especially the kind from  fast-food restaurants. Limit your consumption to less than once per week.  Pastries and sweets: This includes most of the processed junk food and desserts you can think of. Ice cream, cookies, brownies, snack cakes, donuts, candy and more. Try to limit these to no more than four times a week.  The following compensatory strategies may be helpful for managing day-to-day memory symptoms: Minimize distractions and interruptions to the extent possible. Be an active observer, present-minded, and focus attention. Focus on only one task for a period of time.  Get organized. Establish routines and stick to them. Make and use checklists.  Use external memory aids as needed, such as a planner and notebook. Repetition, written reminders, and keeping a calendar of appointments may be helpful. Designate a place to keep your keys, wallet, cell phone, and other personal belongings.  Break down tasks into smaller steps to help get started and to keep from feeling overwhelmed.  Increase your success learning information by breaking it into manageable chunks, connecting it to previously learned information, or forming associations with what you are trying to remember.   You might consider picking up a copy of Improving Your Memory: How to Remember What You're Starting to Forget, by Hal Morales and Meda Klinefelter, [facility], 2005; effectiveness of using this book may be enhanced with individual counseling reviewing the related behavioral and cognitive strategies.   You can return to clinic in 18-24 months for reevaluation. Of course you could return sooner if clinically indicated/desired.  Test Findings  Test scores are summarized in additional documentation associated with this encounter. Test scores are relative to age, gender, and educational history as available and appropriate. There were no concerns about performance validity as all findings fell within normal expectations.   General  Intellectual Functioning/Achievement:  Performance on single word reading was average. Performance on the RIST index was at the low end of the average range, although there was some clinically relevant variability amongst compound subtests. Performance on the more verbally mediated subtest was high average whereas performance on the more visually oriented subtest was unusually low. While this measure is designed to provide an assessment of premorbid status, it can be affected by acquired decline, which I wonder about in the case of Mr. Haskill visually mediated subtest score.  Attention and Processing Efficiency: Performance on indicators of attention and processing efficiency was weak, below expectations due to several low scores. Digit repetition forward was average and digit repetition backward was unusually low.  is performance on timed number-symbol coding was extremely low.  Performance on the Trail Making A test was low average, although this is a very easy measure, so that may still denote a weak performance.  Language: Performance on visual object confrontation naming was errorless. Generation of words in response to the letters F-A-S and the category prompt "animals" was low average.  Visuospatial Function: Performance on visuospatial/constructional measures was perhaps marginally below expectations for Mr. Boothe, with weak unusually low figure copy. Judgment of angular line orientations was a bit better an in the low average to average range.  When  coupled with his low score on the visually mediated subtest of the RIST, I do wonder if this is not picking up on some incipient visual perceptual abnormalities, although it is difficult to tell and this could also relate to premorbid weaknesses or testing artifact.  Learning and Memory: Performance on measures of learning and memory was marginally below expectations for an individual of Mr. Criste ability. Rather than a storage problem, the  profile shows difficulties with encoding of information and inconsistent delayed free recall. He was able to acquire and retain information over time and I have no concerns about a frank memory storage problem.  In the verbal realm, Mr. Yaffe learned 3, 6, and 8 words of a 12 item word list across 3 learning trials, which was low average. He recalled 7 words at short delayed recall, which is average and then retained all 7 of those on long delayed recall, which is also average. Recognition discriminability for the words from the list versus false choices was average. Short story learning was similar, with unusually low immediate recall and then good average range delayed recall. Memory for brief daily living information was unusually low on immediate recall and extremely low on long delayed recall with poor recognition discriminability, although this is an isolated low delayed recall performance amidst otherwise good retention of information across time.  In the visual realm, delayed recall of a modestly complex figure was average.  Executive Functions: Performance on executive measures was generally good, although as above, his overall pattern of findings suggests frontal-subcortical difficulties. In this patient's case, these seem to affect processing speed, working memory, and attention more than scores on dedicated executive measures.  Performance on the executive function composition score of the Modified [employer] was average. Alternating sequencing of numbers and letters of the alphabet was average. Generation of words in response to the letters F-A-S was weak (low average, almost unusually low). Clock drawing was "borderline", with stimulus bound placement of the hands.  Rating Scale(s): Mr. Topel screened negative for the presence of depression. His wife characterized him as functioning at more of an MCI than a dementia level, which is consistent with my rating of his  functioning on the CDR.  Viviano Simas Nicole Kindred, PsyD, ABN Clinical Neuropsychologist  Coding and Compliance  Billing below reflects technician time, my direct face-to-face time with the patient, time spent in test administration, and time spent in professional activities including but not limited to: neuropsychological test interpretation, integration of neuropsychological test data with clinical history, report preparation, treatment planning, care coordination, and review of diagnostically pertinent medical history or studies.   Services associated with this encounter: Clinical Interview [phone]) plus 160 minutes (96132/96133; Neuropsychological Evaluation by Professional)  25 minutes (96136/96137; Test Administration by Professional) 95 minutes (96138/96139; Neuropsychological Testing by Technician)

## 2020-11-13 ENCOUNTER — Other Ambulatory Visit: Payer: Self-pay

## 2020-11-13 ENCOUNTER — Ambulatory Visit (INDEPENDENT_AMBULATORY_CARE_PROVIDER_SITE_OTHER): Payer: Medicare Other | Admitting: Counselor

## 2020-11-13 ENCOUNTER — Encounter: Payer: Self-pay | Admitting: Occupational Therapy

## 2020-11-13 ENCOUNTER — Ambulatory Visit: Payer: Medicare Other | Admitting: Occupational Therapy

## 2020-11-13 DIAGNOSIS — R251 Tremor, unspecified: Secondary | ICD-10-CM

## 2020-11-13 DIAGNOSIS — G20 Parkinson's disease: Secondary | ICD-10-CM | POA: Diagnosis not present

## 2020-11-13 DIAGNOSIS — G20C Parkinsonism, unspecified: Secondary | ICD-10-CM

## 2020-11-13 DIAGNOSIS — R2681 Unsteadiness on feet: Secondary | ICD-10-CM | POA: Diagnosis not present

## 2020-11-13 DIAGNOSIS — R278 Other lack of coordination: Secondary | ICD-10-CM | POA: Diagnosis not present

## 2020-11-13 DIAGNOSIS — R293 Abnormal posture: Secondary | ICD-10-CM

## 2020-11-13 DIAGNOSIS — R29898 Other symptoms and signs involving the musculoskeletal system: Secondary | ICD-10-CM | POA: Diagnosis not present

## 2020-11-13 DIAGNOSIS — G3184 Mild cognitive impairment, so stated: Secondary | ICD-10-CM | POA: Diagnosis not present

## 2020-11-13 DIAGNOSIS — R41840 Attention and concentration deficit: Secondary | ICD-10-CM

## 2020-11-13 DIAGNOSIS — R29818 Other symptoms and signs involving the nervous system: Secondary | ICD-10-CM

## 2020-11-13 DIAGNOSIS — R2689 Other abnormalities of gait and mobility: Secondary | ICD-10-CM

## 2020-11-13 NOTE — Progress Notes (Signed)
   NEUROPSYCHOLOGY FEEDBACK NOTE [facility]  Feedback Note: I met with Cody Dickerson to review the findings resulting from his neuropsychological evaluation. Since the last appointment, he has been about the same. Time was spent reviewing the impressions and recommendations that are detailed in the evaluation report. We discussed impression of MCI with a pattern that is highly consistent with expectations in the setting of Parkinson's disease. We spent particular time discussing lifestyle changes that may be helpful for ameliorating cognitive decline. While validated and researched primarily in an Alzheimer's context, there is some reason to think that these may have benefit for other causes of MCI as well. I took time to explain the findings and answer all the patient's questions. I encouraged Mr. Duval to contact me should he have any further questions or if further follow up is desired.   Current Medications and Medical History   Current Outpatient Medications  Medication Sig Dispense Refill   amLODipine (NORVASC) 2.5 MG tablet Take 4 tablets (10 mg total) by mouth daily. 112 tablet 1   carbidopa-levodopa (SINEMET IR) 25-100 MG tablet TAKE 1 TABLET BY MOUTH THREE TIMES DAILY (7AM, 11AM,4PM) 270 tablet 1   indomethacin (INDOCIN) 50 MG capsule 3 (three) times daily as needed.      Multiple Vitamin (MULTIVITAMIN) tablet Take 1 tablet by mouth daily.     rosuvastatin (CRESTOR) 20 MG tablet Take 20 mg by mouth daily.   4   valsartan (DIOVAN) 320 MG tablet 1 tablet     vitamin B-12 (CYANOCOBALAMIN) 500 MCG tablet Take 500 mcg by mouth daily.     No current facility-administered medications for this visit.    Patient Active Problem List   Diagnosis Date Noted   SBO (small bowel obstruction) ([place]) 07/06/2019    Mental Status and Behavioral Observations  Cody Dickerson presented on time to the present encounter and was alert and generally oriented. Speech was normal in rate, rhythm,  volume, and prosody. Self-reported mood was "good" and affect was euthymic. Thought process was logical and goal oriented and thought content was appropriate to the topics discussed. There were no safety concerns identified at today's encounter, such as thoughts of harming self or others.   Plan  Feedback provided regarding the patient's neuropsychological evaluation. He has MCI due to possible PD, with primarily frontal-subcortical type difficulties. He presented as motivated to engage in health strategies and is already doing various forms of exercise recommended by Dr. Carles Collet (e.g., Spinning).  Cody Dickerson was encouraged to contact me if any questions arise or if further follow up is desired.   Viviano Simas Nicole Kindred, PsyD, ABN Clinical Neuropsychologist  Service(s) Provided at This Encounter: 36 minutes [phone]; Conjoint therapy with patient present)

## 2020-11-13 NOTE — Therapy (Addendum)
[facility] [address] [place], [place], [address] Phone: [phone]   Fax:  [phone]  Occupational Therapy Treatment  Patient Details  Name: Cody Dickerson MRN: [id number] Date of Birth: Jul 26, 1950 Referring Provider (OT): Dr. Wells Guiles Tat   Encounter Date: 11/13/2020   OT End of Session - 11/13/20 1237     Visit Number 12    Number of Visits 17    Date for OT Re-Evaluation 11/30/20    Authorization Type Medicare & AARP, covered at 100%    Authorization - Visit Number 12    Authorization - Number of Visits 10    Progress Note Due on Visit 10    OT Start Time 1235    OT Stop Time 1320    OT Time Calculation (min) 45 min    Activity Tolerance Patient tolerated treatment well    Behavior During Therapy [facility] for tasks assessed/performed             Past Medical History:  Diagnosis Date   Balance problem    Gout    Hypercholesterolemia    Hypertension    Small bowel obstruction [facility] [place])     Past Surgical History:  Procedure Laterality Date   BOWEL BLOCKAGE  05/2019    There were no vitals filed for this visit.   Subjective Assessment - 11/13/20 1237     Subjective  Denies pain    Pertinent History Parkinson's Disease.  PMH:  Hypercholesterolemia, HTN, hx of gout, hx small bowel blockage.    Currently in Pain? No/denies              Standing, tossing scarves with each UE incorporating wt. Shift and trunk rotation with min cueing for large amplitude reach/hand.  (Whole hand and then with PWR! Hand step for pinch).  Standing, functional step and reach forward/back in diagonal pattern with min cueing for large amplitude.  Practiced turning in small space to carry tray with min cueing for technique of diagonal step out and pt returned demo.  Then practiced turning with functional ambulation with min cueing for adaptive technique.  Sitting, practiced getting wallet in/out of pocket with emphasis  on lateral wt. Shift with improvement noted with repetition.  Wrote 3 sentences with good legibility and size.  Briefly discussed recent diagnosis of MCI (mild cognitive impairment).  Reviewed common cognitive changes with PD and briefly reviewed recommendations (keeping thinking skills sharp handout--previously issued)  Began checking goals in prep for d/c next session.  Verbally reviewed large amplitude movement strategies for ADLs/initially reported difficulties and discussed progress.  Pt educated in recommendations for follow up (eval vs. Screen).  Pt prefers multi-d screening--recommended in 6-9 months.  Discussed indications for return to OT after d/c.  Pt verbalized understanding/agreement.       OT Short Term Goals - 11/13/20 1313       OT SHORT TERM GOAL #1   Title Pt will be independent with PD-specific HEP--check STGs 10/30/20    Time 4    Period Weeks    Status Achieved      OT SHORT TERM GOAL #2   Title Pt will verbalize understanding of ways to decr risk of future complications related to PD.    Time 4    Period Weeks    Status Achieved      OT SHORT TERM GOAL #3   Title Pt will report incr ease in using LUE to assist in washing hair.    Time  4    Period Weeks    Status Partially Met   11/01/20:  not fully met.  11/13/20:  min improvement when initiating with LUE     OT SHORT TERM GOAL #4   Title Pt will don/doff jacket using large amplitude movement strategies (feet apart, associated trunk movements) for incr ease and safety.    Time 4    Period Weeks    Status Achieved   11/06/20-met, pt returned demonstration              OT Long Term Goals - 11/13/20 1241       OT LONG TERM GOAL #1   Title Pt will verbalize understanding of adaptive strategies for ADLs/IADLs to incr ease/safety, and decr risk of future complications.--check LTGs 11/30/20    Time 8    Period Weeks    Status On-going      OT LONG TERM GOAL #2   Title Pt will verbalize understanding of  memory compensation strategies and ways to keep thinking skills sharp.    Time 8    Period Weeks    Status Achieved   11/06/20-met     OT LONG TERM GOAL #3   Title Pt will improve bilateral hand coordination and ability to dress as shown by fastening/unfastening 3 buttons in less than or equal to 40sec.    Baseline 57.12sec    Time 8    Period Weeks    Status Achieved      OT LONG TERM GOAL #4   Title Pt improve functional reach and coordination as shown by improving box and block score by at least 3 blocks bilateral.    Baseline R-44 blocks, L-47 blocks    Time 8    Period Weeks    Status Partially Met   11/13/20:  R-47blocks, L-45blocks (met with RUE, not met with LUE)     OT LONG TERM GOAL #5   Title Pt will verbalize understanding of PD related community resources.    Time 8    Period Weeks    Status Achieved   hanout issued 11/08/20                  Plan - 11/13/20 1237     Clinical Impression Statement Pt has made good overall progress and reports that he is incorporating strategies into ADLs and is able to return demo use of strategies after review.    OT Occupational Profile and History Detailed Assessment- Review of Records and additional review of physical, cognitive, psychosocial history related to current functional performance    Occupational performance deficits (Please refer to evaluation for details): ADL's;IADL's;Work;Leisure    Body Structure / Function / Physical Skills ADL;Balance;Decreased knowledge of use of DME;Tone;GMC;Dexterity;UE functional use;IADL;FMC;Coordination;Mobility;Improper spinal/pelvic alignment;Body mechanics;Proprioception    Cognitive Skills Attention;Memory    Rehab Potential Good    Clinical Decision Making Several treatment options, min-mod task modification necessary    Comorbidities Affecting Occupational Performance: May have comorbidities impacting occupational performance    Modification or Assistance to Complete Evaluation   Min-Moderate modification of tasks or assist with assess necessary to complete eval    OT Frequency 2x / week    OT Duration 8 weeks   +eval (delayed start in scheduling), may only need 6 weeks depending on progress   OT Treatment/Interventions Self-care/ADL training;Moist Heat;DME and/or AE instruction;Balance training;Therapeutic activities;Aquatic Therapy;Cognitive remediation/compensation;Therapeutic exercise;Cryotherapy;Energy conservation;Manual Therapy;Patient/family education;Passive range of motion;Functional Mobility Training;Neuromuscular education    Plan d/c next session; schedule follow-up  PD screens in approx 6-86month    OT Home Exercise Plan educated provided:  PWR! seated; PWR! hands; coordination HEP, handwriting strategies    Consulted and Agree with Plan of Care Patient             Patient will benefit from skilled therapeutic intervention in order to improve the following deficits and impairments:   Body Structure / Function / Physical Skills: ADL, Balance, Decreased knowledge of use of DME, Tone, GMC, Dexterity, UE functional use, IADL, FMC, Coordination, Mobility, Improper spinal/pelvic alignment, Body mechanics, Proprioception Cognitive Skills: Attention, Memory     Visit Diagnosis: Other lack of coordination  Other symptoms and signs involving the musculoskeletal system  Other symptoms and signs involving the nervous system  Tremor  Abnormal posture  Unsteadiness on feet  Attention and concentration deficit  Other abnormalities of gait and mobility    Problem List Patient Active Problem List   Diagnosis Date Noted   SBO (small bowel obstruction) ([facility] 07/06/2019    Mariyah Upshaw, OT/L 11/13/2020, 2:39 PM  [place] [address]Phone: [phone]  Fax:  [phone] Name: JKAMONI GENTLESMRN: [id number] of Birth: 1March 17, 1952 AVianne Bulls  OTR/L [facility] [place]  [phone] [phone]/13/22 2:39 PM

## 2020-11-13 NOTE — Patient Instructions (Signed)
Your performance and presentation on neuropsychological assessment were consistent with less than expected performance in several different areas. The main difficulty you had was on measures of so-called "frontal-subcortical abilities," including working memory, processing speed, and attention. These sorts of problems can often affect memory functioning and result in her perception of memory problems, by reducing encoding of information (i.e., ability to get information in). You also had a marginal finding on measures of visuospatial/constructional abilities, this could represent a decline although it may also represent testing artifact because it is not very abnormal. The best diagnosis at this point in time is mild cognitive impairment.     The major difference between mild cognitive impairment (MCI) and dementia is in severity and potential prognosis. Once someone reaches a level of severity adequate to be diagnosed with a dementia, there is usually progression over time, though this may be years. On the other hand, mild cognitive impairment, while a significant risk for dementia in future, does not always progress to dementia, and in some instances stays the same or can even revert to normal. It is important to realize that if MCI is due to underlying Alzheimer's disease, it will most likely progress to dementia eventually. The rate of conversion to Alzheimer's dementia from amnestic MCI is about 15% per year versus the general population risk of conversion of 2% per year.    In your case, your mild cognitive impairment does not look like it is due to Alzheimer's disease. This is good! In fact, you had good retention of information across time in most cases on memory measures, which tends to be the earliest and most sensitive indicator of the type of changes we expect to see in Alzheimer's. Parkinson's disease involves frontal-subcortical system dysfunction, and seems to be your working diagnosis as per my  interpretation of Dr. Doristine Devoid notes. Your test data go well with Parkinson's disease as a possible cause.   In terms of treatment, I think it is extremely important to continue with the lifestyle changes that you are already doing under Dr. Doristine Devoid direction (e.g., spinning, exercise, PT/OT). I also suggest that you consider dietary changes and compensatory strategies.  There is now good quality evidence from at least one large scale study that a modified mediterranean diet may help slow cognitive decline. This is known as the "MIND" diet. The Mind diet is not so much a specific diet as it is a set of recommendations for things that you should and should not eat.   Foods that are ENCOURAGED on the MIND Diet:  Green, leafy vegetables: Aim for six or more servings per week. This includes kale, spinach, cooked greens and salads.  All other vegetables: Try to eat another vegetable in addition to the green leafy vegetables at least once a day. It is best to choose non-starchy vegetables because they have a lot of nutrients with a low number of calories.  Berries: Eat berries at least twice a week. There is a plethora of research on strawberries, and other berries such as blueberries, raspberries and blackberries have also been found to have antioxidant and brain health benefits.  Nuts: Try to get five servings of nuts or more each week. The creators of the [place] don't specify what kind of nuts to consume, but it is probably best to vary the type of nuts you eat to obtain a variety of nutrients. Peanuts are a legume and do not fall into this category.  Olive oil: Use olive oil as your main  cooking oil. There may be other heart-healthy alternatives such as algae oil, though there is not yet sufficient research upon which to base a formal recommendation.  Whole grains: Aim for at least three servings daily. Choose minimally processed grains like oatmeal, quinoa, brown rice, whole-wheat pasta and 100% whole-wheat  bread.  Fish: Eat fish at least once a week. It is best to choose fatty fish like salmon, sardines, trout, tuna and mackerel for their high amounts of omega-3 fatty acids.  Beans: Include beans in at least four meals every week. This includes all beans, lentils and soybeans.  Poultry: Try to eat chicken or [place] at least twice a week. Note that fried chicken is not encouraged on the MIND diet.  Wine: Aim for no more than one glass of alcohol daily. Both red and white wine may benefit the brain. However, much research has focused on the red wine compound resveratrol, which may help protect against Alzheimer's disease.  Foods that are DISCOURAGED on the MIND Diet: Butter and margarine: Try to eat less than 1 tablespoon (about 14 grams) daily. Instead, try using olive oil as your primary cooking fat, and dipping your bread in olive oil with herbs.  Cheese: The MIND diet recommends limiting your cheese consumption to less than once per week.  Red meat: Aim for no more than three servings each week. This includes all beef, pork, lamb and products made from these meats.  [place] food: The MIND diet highly discourages fried food, especially the kind from fast-food restaurants. Limit your consumption to less than once per week.  Pastries and sweets: This includes most of the processed junk food and desserts you can think of. Ice cream, cookies, brownies, snack cakes, donuts, candy and more. Try to limit these to no more than four times a week.   The following compensatory strategies may be helpful for managing day-to-day memory symptoms: Minimize distractions and interruptions to the extent possible. Be an active observer, present-minded, and focus attention. Focus on only one task for a period of time.  Get organized. Establish routines and stick to them. Make and use checklists.  Use external memory aids as needed, such as a planner and notebook. Repetition, written reminders, and keeping a calendar of  appointments may be helpful. Designate a place to keep your keys, wallet, cell phone, and other personal belongings.  Break down tasks into smaller steps to help get started and to keep from feeling overwhelmed.  Increase your success learning information by breaking it into manageable chunks, connecting it to previously learned information, or forming associations with what you are trying to remember.    You might consider picking up a copy of Improving Your Memory: How to Remember What You're Starting to Forget, by Hal Morales and Meda Klinefelter, [facility], 2005; effectiveness of using this book may be enhanced with individual counseling reviewing the related behavioral and cognitive strategies.    You can return to clinic in 18-24 months for reevaluation. Of course you could return sooner if clinically indicated/desired.

## 2020-11-13 NOTE — Progress Notes (Signed)
Primary Physician/Referring:  Lawerance Cruel, MD  Patient ID: Cody Dickerson, male    DOB: 1950-08-12, 70 y.o.   MRN: [id number]  Chief Complaint  Patient presents with   Atrial Fibrillation   diastolic Heart Failure    HPI:    Cody Dickerson  is a 70 y.o. fairly active Caucasian male with hypertension, Parkinson's disease which is well controlled, with an extensive evaluation for dizziness, including neurology and ENT felt to be unyielding, originally referred for cardiac evaluation of dizziness.  Patient presents for 48-monthfollow-up.  Since last office visit patient has been taking amlodipine 10 mg daily and has discontinued valsartan, also last office visit advised him to use support stockings due to orthostatic hypotension, however he has not done so.  Also obtained carotid artery duplex which revealed moderate bilateral disease.  Treadmill stress test and echocardiogram were also ordered.  Stress test was inconclusive as patient only reached 68% age predicted heart rate, and echocardiogram was normal.  Patient continues to have episodes of dizziness upon standing.   He has not undergone sleep study yet, as he reports he has not yet heard back from the referral.   Denies chest pain, palpitations, syncope, near syncope.  Past Medical History:  Diagnosis Date   Balance problem    Gout    Hypercholesterolemia    Hypertension    Small bowel obstruction ([facility]    Past Surgical History:  Procedure Laterality Date   BOWEL BLOCKAGE  05/2019   Family History  Problem Relation Age of Onset   Hypertension Mother    Arthritis Father    Prostate cancer Brother     Social History   Tobacco Use   Smoking status: Former    Packs/day: 0.50    Years: 5.00    Pack years: 2.50    Types: Cigarettes    Quit date: 1980    Years since quitting: 42.7   Smokeless tobacco: Never  Substance Use Topics   Alcohol use: Yes    Comment: 2 glasses wine/day prior to bowel surgery; since  bowel surger 05/2019 - only 2 glasses since   Marital Status: Married  ROS  Review of Systems  Constitutional: Positive for malaise/fatigue (daytime).  Cardiovascular:  Negative for chest pain, dyspnea on exertion, leg swelling, near-syncope, orthopnea, palpitations and syncope.  Respiratory:  Positive for snoring.   Gastrointestinal:  Negative for melena.  Neurological:  Positive for disturbances in coordination, dizziness and tremors.   Objective  Blood pressure 138/77, pulse 63, temperature 97.8 F (36.6 C), height _0  (1.753 m), weight 163 lb 12.8 oz (74.3 kg), SpO2 98 %. Body mass index is 24.19 kg/m.  Vitals with BMI 11/14/2020 08/16/2020 08/06/2020  Height _1  _2  _3   Weight 163 lbs 13 oz 166 lbs 13 oz 166 lbs  BMI 24.18 221.19241.7 Systolic [id number] Diastolic 77 79 82  Pulse 63 66 66    Orthostatic VS for the past 72 hrs (Last 3 readings):  Patient Position BP Location Cuff Size  11/14/20 1024 Sitting Left Arm Normal      Physical Exam Vitals reviewed.  HENT:     Head: Normocephalic and atraumatic.  Neck:     Vascular: Carotid bruit (Bilateral  soft) present. No JVD.  Cardiovascular:     Rate and Rhythm: Normal rate and regular rhythm.     Pulses: Intact distal pulses.     Heart sounds: S1 normal and S2 normal.  Murmur heard.  Blowing holosystolic murmur is present with a grade of 2/6 at the apex.  Crescendo midsystolic murmur of grade 2/6 is also present.    No gallop.  Pulmonary:     Effort: Pulmonary effort is normal. No respiratory distress.     Breath sounds: Normal breath sounds. No wheezing, rhonchi or rales.  Musculoskeletal:     Right lower leg: No edema.     Left lower leg: No edema.  Neurological:     Mental Status: He is alert.     Laboratory examination:  No flowsheet data found. No flowsheet data found. Lipid Panel  No results found for: CHOL, TRIG, HDL, CHOLHDL, VLDL, LDLCALC, LDLDIRECT HEMOGLOBIN A1C No results found for:  HGBA1C, MPG TSH No results for input(s): TSH in the last 8760 hours.  External labs:  08/20/2020: Hb 13.8/HCT 40.8, platelets 160, normal indicis. Serum glucose 140 mg, BUN 20, creatinine 1.24, EGFR 60 [place], potassium 4.6. TSH normal at 0.92. Total cholesterol 186, triglycerides 73, HDL 105, LDL 68.  Medications and allergies   Allergies  Allergen Reactions   Escitalopram     Other reaction(s): felt weird and tired   Lisinopril Cough    Medication prior to this encounter:   Outpatient Medications Prior to Visit  Medication Sig Dispense Refill   amLODipine (NORVASC) 2.5 MG tablet Take 4 tablets (10 mg total) by mouth daily. 112 tablet 1   carbidopa-levodopa (SINEMET IR) 25-100 MG tablet TAKE 1 TABLET BY MOUTH THREE TIMES DAILY (7AM, 11AM,4PM) 270 tablet 1   indomethacin (INDOCIN) 50 MG capsule 3 (three) times daily as needed.      Multiple Vitamin (MULTIVITAMIN) tablet Take 1 tablet by mouth daily.     rosuvastatin (CRESTOR) 20 MG tablet Take 20 mg by mouth daily.   4   vitamin B-12 (CYANOCOBALAMIN) 500 MCG tablet Take 500 mcg by mouth daily.     valsartan (DIOVAN) 320 MG tablet 1 tablet (Patient not taking: Reported on 11/14/2020)     No facility-administered medications prior to visit.    FINAL MEDICATION AS OF TODAY:   Medications after current encounter Current Outpatient Medications  Medication Instructions   amLODipine (NORVASC) 10 mg, Oral, Daily   carbidopa-levodopa (SINEMET IR) 25-100 MG tablet TAKE 1 TABLET BY MOUTH THREE TIMES DAILY (7AM, 11AM,4PM)   indomethacin (INDOCIN) 50 MG capsule 3 times daily PRN   Multiple Vitamin (MULTIVITAMIN) tablet 1 tablet, Oral, Daily   rosuvastatin (CRESTOR) 20 mg, Oral, Daily   valsartan (DIOVAN) 320 MG tablet 1 tablet   vitamin B-12 (CYANOCOBALAMIN) 500 mcg, Oral, Daily   Radiology:   No results found.  Cardiac Studies:  Carotid artery duplex 09/21/2020:  Duplex suggests stenosis in the right internal carotid artery  (50-69%), upper end of spectrum.  Duplex suggests stenosis in the left internal carotid artery (50-69%).  Duplex suggests stenosis in the left external carotid artery (<50%).  Antegrade right vertebral artery flow. Antegrade left vertebral artery flow.  Follow up in six months is appropriate if clinically indicated.  Echocardiogram 09/21/2020:  Normal LV systolic function with visual EF 60-65%. Left ventricle cavity  is normal in size. Mild left ventricular hypertrophy. Normal global wall  motion. Normal diastolic filling pattern, normal LAP.  Left atrial cavity is mildly dilated. A lipomatous septum is present.  Mild (Grade I) mitral regurgitation.  Mild tricuspid regurgitation. No evidence of pulmonary hypertension.  Moderate pulmonic regurgitation.  No prior study for comparison.  Exercise treadmill stress test 10/05/2020: Exercise treadmill  stress test performed using Bruce protocol.  Patient reached 7.5 METS, and 68% of age predicted maximum heart rate.  Exercise capacity was fair.  No chest pain reported.  Submaximal heart rate and normal blood pressure response. Stress EKG revealed no ischemic changes. Inconclusive study due to submaximal heart rate response.   EKG:   11/14/2020: Sinus rhythm at a rate of 60 bpm.  Normal axis.  No evidence of ischemia or underlying injury pattern.  Compared to EKG 08/16/2020, no significant change.  Assessment     ICD-10-CM   1. Orthostatic hypotension  I95.1     2. Primary hypertension  I10     3. Hypercholesteremia  E78.00     4. Bilateral carotid artery stenosis  I65.23     5. Heart failure, unspecified HF chronicity, unspecified heart failure type ([place])  I50.9 EKG 12-Lead       There are no discontinued medications.   No orders of the defined types were placed in this encounter.   Orders Placed This Encounter  Procedures   EKG 12-Lead   Recommendations:   BUDDIE MARSTON is a 70 y.o.  fairly active Caucasian male with  hypertension, Parkinson's disease which is well controlled, with an extensive evaluation for dizziness, including neurology and ENT felt to be unyielding, originally referred for cardiac evaluation of dizziness.  Patient presents for 28-monthfollow-up.  Since last office visit patient has been taking amlodipine 10 mg daily and has discontinued valsartan, also last office visit advised him to use support stockings due to orthostatic hypotension, however he has not done so.  Also obtained carotid artery duplex which revealed moderate bilateral disease.  Treadmill stress test and echocardiogram were also ordered.  Stress test was inconclusive as patient only reached 68% age predicted heart rate, and echocardiogram was normal.  Reviewed and discussed results of cardiac testing with patient, details above.  In regard to stress test which was inconclusive, discussed with patient repeating stress test versus proceeding with conservative measures to improve dizziness and reevaluating need for repeat stress test.  Patient prefers to hold off on repeating stress test at this time.  We will first start wearing compression stockings and focus on increasing water intake.  Our office staff will follow-up on sleep study referral.  In regard to carotid artery stenosis, will repeat surveillance duplex in 6 months.   Follow-up in 3 months, sooner if needed, for dizziness and mild orthostasis.   This was a 35-minute encounter with face-to-face counseling, medical records review, coordination of care, explanation of complex medical issues, complex medical decision making.    CAlethia Berthold PA-C 11/14/2020, 3:19 PM Office: [phone]

## 2020-11-14 ENCOUNTER — Ambulatory Visit: Payer: Medicare Other | Admitting: Cardiology

## 2020-11-14 ENCOUNTER — Encounter: Payer: Self-pay | Admitting: Student

## 2020-11-14 ENCOUNTER — Ambulatory Visit: Payer: Medicare Other | Admitting: Student

## 2020-11-14 VITALS — BP 138/77 | HR 63 | Temp 97.8°F | Ht 69.0 in | Wt 163.8 lb

## 2020-11-14 DIAGNOSIS — I10 Essential (primary) hypertension: Secondary | ICD-10-CM | POA: Diagnosis not present

## 2020-11-14 DIAGNOSIS — I509 Heart failure, unspecified: Secondary | ICD-10-CM | POA: Diagnosis not present

## 2020-11-14 DIAGNOSIS — I951 Orthostatic hypotension: Secondary | ICD-10-CM | POA: Diagnosis not present

## 2020-11-14 DIAGNOSIS — E7800 Pure hypercholesterolemia, unspecified: Secondary | ICD-10-CM

## 2020-11-14 DIAGNOSIS — I6523 Occlusion and stenosis of bilateral carotid arteries: Secondary | ICD-10-CM | POA: Diagnosis not present

## 2020-11-15 ENCOUNTER — Ambulatory Visit: Payer: Medicare Other | Admitting: Occupational Therapy

## 2020-11-15 ENCOUNTER — Encounter: Payer: Self-pay | Admitting: Occupational Therapy

## 2020-11-15 ENCOUNTER — Other Ambulatory Visit: Payer: Self-pay

## 2020-11-15 DIAGNOSIS — R2681 Unsteadiness on feet: Secondary | ICD-10-CM

## 2020-11-15 DIAGNOSIS — R29818 Other symptoms and signs involving the nervous system: Secondary | ICD-10-CM

## 2020-11-15 DIAGNOSIS — R293 Abnormal posture: Secondary | ICD-10-CM | POA: Diagnosis not present

## 2020-11-15 DIAGNOSIS — R29898 Other symptoms and signs involving the musculoskeletal system: Secondary | ICD-10-CM | POA: Diagnosis not present

## 2020-11-15 DIAGNOSIS — R251 Tremor, unspecified: Secondary | ICD-10-CM | POA: Diagnosis not present

## 2020-11-15 DIAGNOSIS — R41840 Attention and concentration deficit: Secondary | ICD-10-CM

## 2020-11-15 DIAGNOSIS — R278 Other lack of coordination: Secondary | ICD-10-CM

## 2020-11-15 DIAGNOSIS — R2689 Other abnormalities of gait and mobility: Secondary | ICD-10-CM

## 2020-11-15 NOTE — Therapy (Signed)
[place] [address] [place], [place], [address] Phone: [phone]   Fax:  [phone]  Occupational Therapy Treatment  Patient Details  Name: Cody Dickerson MRN: [id number] Date of Birth: [birth year] Referring Provider (OT): Dr. Wells Guiles Tat   Encounter Date: 11/15/2020   OT End of Session - 11/15/20 1108     Visit Number 13    Number of Visits 17    Date for OT Re-Evaluation 11/30/20    Authorization Type Medicare & AARP, covered at 100%    Authorization - Visit Number 13    Authorization - Number of Visits 10    Progress Note Due on Visit 20    OT Start Time 1107    OT Stop Time 1145    OT Time Calculation (min) 38 min    Activity Tolerance Patient tolerated treatment well    Behavior During Therapy [facility] for tasks assessed/performed             Past Medical History:  Diagnosis Date   Balance problem    Gout    Hypercholesterolemia    Hypertension    Small bowel obstruction [facility])     Past Surgical History:  Procedure Laterality Date   BOWEL BLOCKAGE  05/2019    There were no vitals filed for this visit.   Subjective Assessment - 11/15/20 1108     Subjective  Pt feels like he is doing well, donned button-down shirt without difficulty.    Pertinent History Parkinson's Disease.  PMH:  Hypercholesterolemia, HTN, hx of gout, hx small bowel blockage.    Patient Stated Goals improve writing, improve ADLs    Currently in Pain? No/denies             Standing, rock and reach forward/backward with each side (using counter for balance) with min verbal cueing for UE stretch/large amplitude movements.  (For wt. Shift, incr arm swing, and functional reach with associated trunk movements).   Standing, rock and reach with twist for trunk rotation, wt. Shift, and large amplitude reach with min verbal cueing.    Standing, closed-chain shoulder flex, floor>ceiling with min verbal cueing for mid-line  alignment/posture and close supervision for balance.  Standing, PWR! Rock to targets on cabinets with min cueing and set-up for large amplitude.  Standing, tossing beanbags to target with each UE with focus on wt. Shift forward/back and large amplitude arm movements with toss with min cueing.    Completing [place] Pegboard with both hands simultaneously for bilateral hand coordination and timing with use of PWR! Hands and min difficulty.  Placing small pegs in pegboard with each finger/thumb and each hand for functional PWR! Step with hands with min difficulty.  Reviewed d/c plan and progress, and continued recommendations, will plan to have PD screen in approx 6-9mos.       OT Short Term Goals - 11/13/20 1313       OT SHORT TERM GOAL #1   Title Pt will be independent with PD-specific HEP--check STGs 10/30/20    Time 4    Period Weeks    Status Achieved      OT SHORT TERM GOAL #2   Title Pt will verbalize understanding of ways to decr risk of future complications related to PD.    Time 4    Period Weeks    Status Achieved      OT SHORT TERM GOAL #3   Title Pt will report incr ease in using LUE to assist  in washing hair.    Time 4    Period Weeks    Status Partially Met   11/01/20:  not fully met.  11/13/20:  min improvement when initiating with LUE     OT SHORT TERM GOAL #4   Title Pt will don/doff jacket using large amplitude movement strategies (feet apart, associated trunk movements) for incr ease and safety.    Time 4    Period Weeks    Status Achieved   11/06/20-met, pt returned demonstration              OT Long Term Goals - 11/15/20 1109       OT LONG TERM GOAL #1   Title Pt will verbalize understanding of adaptive strategies for ADLs/IADLs to incr ease/safety, and decr risk of future complications.--check LTGs 11/30/20    Time 8    Period Weeks    Status Achieved      OT LONG TERM GOAL #2   Title Pt will verbalize understanding of memory compensation  strategies and ways to keep thinking skills sharp.    Time 8    Period Weeks    Status Achieved   11/06/20-met     OT LONG TERM GOAL #3   Title Pt will improve bilateral hand coordination and ability to dress as shown by fastening/unfastening 3 buttons in less than or equal to 40sec.    Baseline 57.12sec    Time 8    Period Weeks    Status Achieved      OT LONG TERM GOAL #4   Title Pt improve functional reach and coordination as shown by improving box and block score by at least 3 blocks bilateral.    Baseline R-44 blocks, L-47 blocks    Time 8    Period Weeks    Status Partially Met   11/13/20:  R-47blocks, L-45blocks (met with RUE, not met with LUE)     OT LONG TERM GOAL #5   Title Pt will verbalize understanding of PD related community resources.    Time 8    Period Weeks    Status Achieved   hanout issued 11/08/20                  Plan - 11/15/20 1109     Clinical Impression Statement Pt has made good overall progress and reports that he is incorporating strategies into ADLs.    OT Occupational Profile and History Detailed Assessment- Review of Records and additional review of physical, cognitive, psychosocial history related to current functional performance    Occupational performance deficits (Please refer to evaluation for details): ADL's;IADL's;Work;Leisure    Body Structure / Function / Physical Skills ADL;Balance;Decreased knowledge of use of DME;Tone;GMC;Dexterity;UE functional use;IADL;FMC;Coordination;Mobility;Improper spinal/pelvic alignment;Body mechanics;Proprioception    Cognitive Skills Attention;Memory    Rehab Potential Good    Clinical Decision Making Several treatment options, min-mod task modification necessary    Comorbidities Affecting Occupational Performance: May have comorbidities impacting occupational performance    Modification or Assistance to Complete Evaluation  Min-Moderate modification of tasks or assist with assess necessary to complete  eval    OT Frequency 2x / week    OT Duration 8 weeks   +eval (delayed start in scheduling), may only need 6 weeks depending on progress   OT Treatment/Interventions Self-care/ADL training;Moist Heat;DME and/or AE instruction;Balance training;Therapeutic activities;Aquatic Therapy;Cognitive remediation/compensation;Therapeutic exercise;Cryotherapy;Energy conservation;Manual Therapy;Patient/family education;Passive range of motion;Functional Mobility Training;Neuromuscular education    Plan d/c OT; schedule follow-up PD screens in approx 6-64month  OT Home Exercise Plan educated provided:  PWR! seated; PWR! hands; coordination HEP, handwriting strategies    Consulted and Agree with Plan of Care Patient             Patient will benefit from skilled therapeutic intervention in order to improve the following deficits and impairments:   Body Structure / Function / Physical Skills: ADL, Balance, Decreased knowledge of use of DME, Tone, GMC, Dexterity, UE functional use, IADL, FMC, Coordination, Mobility, Improper spinal/pelvic alignment, Body mechanics, Proprioception Cognitive Skills: Attention, Memory     Visit Diagnosis: Other lack of coordination  Other symptoms and signs involving the musculoskeletal system  Other symptoms and signs involving the nervous system  Tremor  Abnormal posture  Unsteadiness on feet  Attention and concentration deficit  Other abnormalities of gait and mobility    Problem List Patient Active Problem List   Diagnosis Date Noted   SBO (small bowel obstruction) ([place]) 07/06/2019    OCCUPATIONAL THERAPY DISCHARGE SUMMARY  Visits from Start of Care: 13  Current functional level related to goals / functional outcomes: See above   Remaining deficits: Bradykinesia, rigidity, decr balance, and decr coordination   Education / Equipment: Pt was instructed in the following:  PD-specific HEP, adaptive strategies for ADLs/IADLs, ways to prevent  future complications, appropriate community resources.  Pt verbalized understanding of all education provided.     Patient agrees to discharge. Patient goals were partially met. Patient is being discharged due to being pleased with the current functional level.  Pt would benefit from occupational therapy screen in approx 6-9 months to assess for need for further therapy/functional changes due to progressive nature of diagnosis.     [place], OT/L 11/15/2020, 11:32 AM  [place] [address] [place], [place], [address] Phone: [phone]   Fax:  [phone]  Name: Cody Dickerson MRN: [id number] Date of Birth: December 29, 1950  Vianne Bulls, OTR/L [facility] [address] [place], [place]  [address] [phone] phone [phone] 11/15/20 11:32 AM

## 2020-11-29 DIAGNOSIS — G4719 Other hypersomnia: Secondary | ICD-10-CM | POA: Diagnosis not present

## 2020-12-19 ENCOUNTER — Other Ambulatory Visit: Payer: Self-pay | Admitting: Cardiology

## 2020-12-19 DIAGNOSIS — Z23 Encounter for immunization: Secondary | ICD-10-CM | POA: Diagnosis not present

## 2021-01-01 ENCOUNTER — Ambulatory Visit: Payer: Medicare Other | Attending: Internal Medicine

## 2021-01-01 ENCOUNTER — Other Ambulatory Visit (HOSPITAL_BASED_OUTPATIENT_CLINIC_OR_DEPARTMENT_OTHER): Payer: Self-pay

## 2021-01-01 DIAGNOSIS — Z23 Encounter for immunization: Secondary | ICD-10-CM

## 2021-01-01 MED ORDER — PFIZER COVID-19 VAC BIVALENT 30 MCG/0.3ML IM SUSP
INTRAMUSCULAR | 0 refills | Status: DC
  Filled 2021-01-01: qty 0.3, 1d supply, fill #0

## 2021-01-01 NOTE — Progress Notes (Signed)
   Covid-19 Vaccination Clinic  Name:  Cody Dickerson    MRN: [id number] DOB: 1950-04-09  01/01/2021  [name] was observed post Covid-19 immunization for 15 minutes without incident. He was provided with Vaccine Information Sheet and instruction to access the V-Safe system.   [name] was instructed to call 911 with any severe reactions post vaccine: Difficulty breathing  Swelling of face and throat  A fast heartbeat  A bad rash all over body  Dizziness and weakness   Immunizations Administered     Name Date Dose VIS Date Route   Pfizer Covid-19 Vaccine Bivalent Booster 01/01/2021 10:12 AM 0.3 mL 10/31/2020 Intramuscular   Manufacturer: [place]   Lot: [id number]   [place]: [phone]

## 2021-01-10 DIAGNOSIS — G4733 Obstructive sleep apnea (adult) (pediatric): Secondary | ICD-10-CM | POA: Diagnosis not present

## 2021-01-16 DIAGNOSIS — E7800 Pure hypercholesterolemia, unspecified: Secondary | ICD-10-CM | POA: Diagnosis not present

## 2021-01-16 DIAGNOSIS — I10 Essential (primary) hypertension: Secondary | ICD-10-CM | POA: Diagnosis not present

## 2021-01-16 DIAGNOSIS — M1A9XX1 Chronic gout, unspecified, with tophus (tophi): Secondary | ICD-10-CM | POA: Diagnosis not present

## 2021-01-16 DIAGNOSIS — D649 Anemia, unspecified: Secondary | ICD-10-CM | POA: Diagnosis not present

## 2021-01-16 DIAGNOSIS — Z125 Encounter for screening for malignant neoplasm of prostate: Secondary | ICD-10-CM | POA: Diagnosis not present

## 2021-01-17 ENCOUNTER — Other Ambulatory Visit: Payer: Self-pay | Admitting: Cardiology

## 2021-01-22 DIAGNOSIS — Z125 Encounter for screening for malignant neoplasm of prostate: Secondary | ICD-10-CM | POA: Diagnosis not present

## 2021-01-22 DIAGNOSIS — R194 Change in bowel habit: Secondary | ICD-10-CM | POA: Diagnosis not present

## 2021-01-22 DIAGNOSIS — L570 Actinic keratosis: Secondary | ICD-10-CM | POA: Diagnosis not present

## 2021-01-22 DIAGNOSIS — R2689 Other abnormalities of gait and mobility: Secondary | ICD-10-CM | POA: Diagnosis not present

## 2021-01-22 DIAGNOSIS — E7800 Pure hypercholesterolemia, unspecified: Secondary | ICD-10-CM | POA: Diagnosis not present

## 2021-01-22 DIAGNOSIS — M1A9XX1 Chronic gout, unspecified, with tophus (tophi): Secondary | ICD-10-CM | POA: Diagnosis not present

## 2021-01-22 DIAGNOSIS — I10 Essential (primary) hypertension: Secondary | ICD-10-CM | POA: Diagnosis not present

## 2021-01-22 DIAGNOSIS — Z0000 Encounter for general adult medical examination without abnormal findings: Secondary | ICD-10-CM | POA: Diagnosis not present

## 2021-01-29 DIAGNOSIS — L570 Actinic keratosis: Secondary | ICD-10-CM | POA: Diagnosis not present

## 2021-01-29 DIAGNOSIS — L718 Other rosacea: Secondary | ICD-10-CM | POA: Diagnosis not present

## 2021-02-04 NOTE — Progress Notes (Signed)
Assessment/Plan:   1.  Parkinsons Disease  -DaTscan previously fairly equivocal, but has met clinical criteria now  -slightly increase carbidopa/levodopa 25/100, 2/1/1 (he feels morning is difficult - shaving, brushing teeth)  -discussed increasing exercise  -We discussed that it used to be thought that levodopa would increase risk of melanoma but now it is believed that Parkinsons itself likely increases risk of melanoma. he is to get regular skin checks.  He sees Dr. Ronnald Ramp and has a f/u in Jan  2.  Left distal supraclinoid ICA aneurysm  -Small, incidental, 2 mm.  Patient had wanted referral to Dr. Estanislado Pandy, which we did back in June.  They attempted to call the patient multiple times, according to his office, and phone call was never returned.  Pt states that they never contacted him but his sister works at [employer] with a neurosx and sent his images and the neurosx told him that there was nothing more to do.    -CTA was repeated in April, 2022 and aneurysm size was stable.  3.  MCI  -Neurocognitive testing done with Dr. Nicole Kindred in September, 2022 demonstrated MCI.  -pt frustrated that testing didn't address focus.  Discussed with him that neurocog testing didn't note anything neurodegen/issues within neuro realm.  Will need to f/u PCP re: focus.  4.  Dizziness  -don't think that this is related to Parkinsons Disease or meds, as I d/c the carbidopa/levodopa and it didn't change sx's  -asked him to f/u with PCP  5.  Depression  -denies  -tried to give lexapro but felt it caused no energy and pt d/c (he took it for a few days).     Subjective:   Cody Dickerson was seen today in follow up for Parkinsons disease.  My previous records were reviewed prior to todays visit as well as outside records available to me.  Patient currently on levodopa and tolerating it well.  We referred him to therapy his last visit.  His next therapy screen is in March.  We did start him on Lexapro last  visit and he reports that he is not taking that.  States that he was tired and had poor energy with it.  He only took it for few days.  He did have MCI on neurocognitive testing with Dr. Nicole Kindred in September, 2022.  "It didn't address my focus issues and doesn't solve my problem."  Had one fall - fell into laundry basket (was on floor in the bathroom) - he was trying to put on pants when standing up so doesn't do that any longer.   Current prescribed movement disorder medications: carbidopa/levodopa 25/100 tid Lexapro, 10 mg daily.  Previous meds:  lexapro (took for a few days and felt it caused decreased energy)  ALLERGIES:   Allergies  Allergen Reactions   Escitalopram     Other reaction(s): felt weird and tired   Lisinopril Cough    CURRENT MEDICATIONS:  Outpatient Encounter Medications as of 02/06/2021  Medication Sig   amLODipine (NORVASC) 2.5 MG tablet TAKE 4 TABLETS BY MOUTH ONCE DAILY   carbidopa-levodopa (SINEMET IR) 25-100 MG tablet TAKE 1 TABLET BY MOUTH THREE TIMES DAILY (7AM, 11AM,4PM)   COVID-19 mRNA bivalent vaccine, Pfizer, (PFIZER COVID-19 VAC BIVALENT) injection Inject into the muscle.   doxycycline (VIBRAMYCIN) 100 MG capsule Take 100 mg by mouth 2 (two) times daily.   indomethacin (INDOCIN) 50 MG capsule 3 (three) times daily as needed.    rosuvastatin (CRESTOR) 20 MG  tablet Take 20 mg by mouth daily.    vitamin B-12 (CYANOCOBALAMIN) 500 MCG tablet Take 500 mcg by mouth daily.   [DISCONTINUED] Multiple Vitamin (MULTIVITAMIN) tablet Take 1 tablet by mouth daily. (Patient not taking: Reported on 02/06/2021)   [DISCONTINUED] valsartan (DIOVAN) 320 MG tablet 1 tablet (Patient not taking: Reported on 11/14/2020)   No facility-administered encounter medications on file as of 02/06/2021.    Objective:   PHYSICAL EXAMINATION:    VITALS:   Vitals:   02/06/21 0814  BP: 126/76  Pulse: 62  SpO2: 97%  Weight: 164 lb 9.6 oz (74.7 kg)  Height: 5' 7"  (1.702 m)       GEN:  The patient appears stated age and is in NAD. HEENT:  Normocephalic, atraumatic.  The mucous membranes are moist. The superficial temporal arteries are without ropiness or tenderness. CV:  RRR Lungs:  CTAB Neck/HEME:  There are no carotid bruits bilaterally.  Neurological examination:  Orientation: The patient is alert and oriented x3. Cranial nerves: There is good facial symmetry with facial hypomimia. The speech is fluent and clear. Soft palate rises symmetrically and there is no tongue deviation. Hearing is intact to conversational tone. Sensation: Sensation is intact to light touch throughout Motor: Strength is at least antigravity x4.  Movement examination: Tone: There is mild increased tone in the RUE Abnormal movements: none Coordination:  There is no significant decremation with RAMs Gait and Station: The patient has no difficulty arising out of a deep-seated chair without the use of the hands. The patient's stride length is slightly decreased arm swing on the R.      Total time spent on today's visit was 30 minutes, including both face-to-face time and nonface-to-face time.  Time included that spent on review of records (prior notes available to me/labs/imaging if pertinent), discussing treatment and goals, answering patient's questions and coordinating care.  Cc:  Lawerance Cruel, MD

## 2021-02-06 ENCOUNTER — Ambulatory Visit (INDEPENDENT_AMBULATORY_CARE_PROVIDER_SITE_OTHER): Payer: Medicare Other | Admitting: Neurology

## 2021-02-06 ENCOUNTER — Encounter: Payer: Self-pay | Admitting: Neurology

## 2021-02-06 ENCOUNTER — Other Ambulatory Visit: Payer: Self-pay

## 2021-02-06 VITALS — BP 126/76 | HR 62 | Ht 67.0 in | Wt 164.6 lb

## 2021-02-06 DIAGNOSIS — I6523 Occlusion and stenosis of bilateral carotid arteries: Secondary | ICD-10-CM | POA: Diagnosis not present

## 2021-02-06 DIAGNOSIS — G20 Parkinson's disease: Secondary | ICD-10-CM

## 2021-02-06 MED ORDER — CARBIDOPA-LEVODOPA 25-100 MG PO TABS: ORAL_TABLET | ORAL | 1 refills | Status: DC

## 2021-02-06 NOTE — Patient Instructions (Signed)
Increase carbidopa/levodopa 25/100, 2 at 7am, 1 at 11am, 1 at [employer] for Power over Parkinson's Group November 2022  [place] over [employer] Group :   Power Over Parkinson's Patient Education Group will be Wednesday, November 9th-*Hybrid meting*- in person at [place] location and via [facility] at 2:00 pm.   Upcoming [employer] over [employer] Meetings:  2nd Wednesdays of the month at 2 pm:  November 9th, December 14th [place] at [email or web address] if interested in participating in this online group Parkinson's Care Partners Group:    3rd Mondays, Contact Misty Paladino Atypical Parkinsonian Patient Group:   4th Wednesdays, [place] If you are interested in participating in these online groups with Misty, please contact her directly for how to join those meetings.  Her contact information is [email or web address].   [place]:  [email or web address] PD Health at Home continues:  Mindfulness Mondays, Expert Briefing Tuesdays, Wellness Wednesdays, Take Time Thursdays, Fitness Fridays -Listings for June 2022 are on the website Upcoming Webinar:  Expert Briefing:  Let's Talk about Dementia.  Wednesday, November 2nd  at 1 pm. Register for Armed forces operational officer) at [email or web address]  Please check out their website to sign up for emails and see their full online offerings  [place]:  [email or web address]  Upcoming Webinar:   2022 in Review:  Progress Toward Better Treatment and Prevention.  Thursday, November 17th at 12 noon Check out additional information on their website to see their full online offerings  [place]:  [email or web address] Upcoming Webinar:  NOH (Neurogenic Orthostatic Hypotension):  What it is, How to Know if you Have it, and How to Manage it.  Tuesday,  November 15th at 3 pm.  Webinar Series:  Living with Parkinson's Meetup.   Third Thursdays of each month at 3 pm; next one is November 17th Care Partner Monthly Meetup.  With Robin Searing Phinney.  First Tuesday of each month, 2 pm Joy Breaks:  First Wednesday of each month, 2-3 pm. There will be art, doodling, making, crafting, listening, laughing, stories, and everything in between. No art experience necessary. No supplies required. Just show up for joy!  Register on their website. Check out additional information to Live Well Today on their website  Parkinson and Movement Disorders (PMD) Alliance:  www.[email or web address] [employer]Life Online:  Online Education Events Sign up for emails, which are sent weekly to give you updates on programming and online offerings  Parkinson's Association of the [place]:  www.[email or web address] Information on online support groups, education events, and online exercises including Yoga, Parkinson's exercises and more-LOTS of information on links to PD resources and online events Virtual Support Group through Parkinson's Association of the [place]; next one is scheduled for Wednesday, November 2nd  at 2 pm.  (These are typically scheduled for the 1st Wednesday of the month at 2 pm).  Visit website for details.  Additional links for movement activities: Parkinson's DRUMMING Classes/Music Therapy with Doylene Canning:  This is a returning class and it's FREE!  2nd Mondays, continuing November 14th.  Contact *Misty Taylor-Paladino at [email or web address] or Doylene Canning at [phone] or [email or web address]  PWR! Moves Classes at [place].  Wednesdays 10 and 11 am.  Contact Amy Marriott, PT [email or web address] if interested. Here is a link to the PWR!Moves classes on Zoom from [place] - Daily Mon-Sat at 10:00. Via Zoom, FREE and open to all.  There is also a link below via Facebook if you  use  that platform. [email or web address] Parkinson's Wellness Recovery (PWR! Moves)  www.[email or web address] Info on the PWR! Virtual Experience:  You will have access to our expertise through self-assessment, guided plans that start with the PD-specific fundamentals, educational content, tips, Q&A with an expert, and a growing Art therapist of PD-specific pre-recorded and live exercise classes of varying types and intensity - both physical and cognitive! If that is not enough, we offer 1:1 wellness consultations (in-person or virtual) to personalize your PWR! Research scientist (medical).  [place] Fridays:  As part of the PD Health @ Home program, this free video series focuses each week on one aspect of fitness designed to support people living with Parkinson's.  These weekly videos highlight the [place] recent fitness guidelines for people with Parkinson's disease.  HollywoodSale.dk Dance for PD website is offering free, live-stream classes throughout the week, as well as links to [employer] of classes:  [email or web address] Dance for Parkinson's Class:  [place].  Free offering for people with Parkinson's and care partners; virtual class.  For more information, contact [phone] or email Ruffin Frederick at [email or web address] Virtual dance and Pilates for Parkinson's classes: Click on the Community Tab> Parkinson's Movement Initiative Tab.  To register for classes and for more information, visit [email or web address] and click the "community" tab.  [employer] Parkinson's Cycling Classes  [employer]: 1pm on Fridays-Live classes at [employer] ([employer] at  [email or web address] or [phone]) [employer]: Virtual Classes Mondays and Thursdays Jeanette Caprice classes Tuesday, Wednesday and Thursday (contact [place] at [email or web address]  or [phone]) [place] Varied levels of classes are offered Mondays, Tuesdays and Thursdays at [employer].  To observe a class or for more information, call [phone] or email [email or web address] Well[employer]: Online Caregiver Education Opportunities:  www.well-[email or web address]/caregiver-education/[email or web address]-support-group.  You may also contact Vickki Muff at [email or web address] or [phone].   *Multiple opportunities below, as November is Caregiver Month!* A Guide to Physical and Mental Fitness for Family Caregivers.  Wednesday, November 9th, 12:30-2 pm at [facility] of You!  Tuesday, November 15th, 11:30-12:45.  [facility], [place] 09-06-1993 Understanding Care Options and Advanced Care Planning.  Monday, November 21st, 4:30-5:45.  [place], [place] above to register. Well-Spring Navigator:  [address] program, a free service to help individuals and families through the journey of determining care for older adults.  The "Navigator" is a [employer], Education officer, museum, who will speak with a prospective client and/or loved ones to provide an assessment of the situation and a set of recommendations for a personalized care plan -- all free of charge, and whether [employer]Spring [employer] offers the needed service or not. If the need is not a service we provide, we are well-connected with reputable programs in town that we can refer you to.  www.well-[email or web address] or to speak with the Navigator, call [phone].

## 2021-02-12 NOTE — Progress Notes (Signed)
Primary Physician/Referring:  Lawerance Cruel, MD  Patient ID: Cody Dickerson, male    DOB: Aug 19, 1950, 70 y.o.   MRN: [id number]  Chief Complaint  Patient presents with   orthostatic hypotension   HPI:    Cody Dickerson  is a 70 y.o. fairly active Caucasian male with hypertension, Parkinson's disease which is well controlled, with an extensive evaluation for dizziness, including neurology and ENT felt to be unyielding, originally referred for cardiac evaluation of dizziness.  Patient presents for 25-monthfollow-up of dizziness and mild orthostasis.  Last office visit had advised patient to use conservative measures to improve dizziness.  He had undergone stress test and echocardiogram, details below.  Patient reports dizziness has resolved, he has had no recurrence.  He does continue to have uneasiness of gait due to Parkinson's for which she follows with neurology closely.  He has undergone sleep study which was negative.  Denies chest pain, dyspnea, syncope, near syncope, palpitations.  Denies orthopnea, PND.  He does have mild intermittent ankle edema.  Past Medical History:  Diagnosis Date   Balance problem    Gout    Hypercholesterolemia    Hypertension    Small bowel obstruction ([facility]    Past Surgical History:  Procedure Laterality Date   BOWEL BLOCKAGE  05/2019   Family History  Problem Relation Age of Onset   Hypertension Mother    Arthritis Father    Prostate cancer Brother     Social History   Tobacco Use   Smoking status: Former    Packs/day: 0.50    Years: 5.00    Pack years: 2.50    Types: Cigarettes    Quit date: 1980    Years since quitting: 42.9   Smokeless tobacco: Never  Substance Use Topics   Alcohol use: Yes    Comment: 2 glasses wine/day prior to bowel surgery; since bowel surger 05/2019 - only 2 glasses since   Marital Status: Married  ROS  Review of Systems  Constitutional: Negative for malaise/fatigue (improved).  Cardiovascular:   Negative for chest pain, dyspnea on exertion, leg swelling, near-syncope, orthopnea, palpitations and syncope.  Respiratory:  Positive for snoring.   Gastrointestinal:  Negative for melena.  Neurological:  Positive for disturbances in coordination and tremors. Negative for dizziness.   Objective  Blood pressure 128/70, pulse (!) 59, temperature 98 F (36.7 C), temperature source Temporal, height _0  (1.702 m), weight 163 lb 12.8 oz (74.3 kg), SpO2 100 %. Body mass index is 25.65 kg/m.  Vitals with BMI 02/13/2021 02/06/2021 11/14/2020  Height _1  _2  _3   Weight 163 lbs 13 oz 164 lbs 10 oz 163 lbs 13 oz  BMI 25.65 226.37285.88 Systolic [id number] Diastolic 70 76 77  Pulse 59 62 63   Orthostatic VS for the past 72 hrs (Last 3 readings):  Orthostatic BP Patient Position BP Location Cuff Size Orthostatic Pulse  02/13/21 1006 118/65 Standing Left Arm Normal 62  02/13/21 1005 132/74 Sitting Left Arm Normal 62  02/13/21 1004 137/76 Supine Left Arm Normal 60   Physical Exam Vitals reviewed.  Neck:     Vascular: Carotid bruit (Bilateral  soft) present. No JVD.  Cardiovascular:     Rate and Rhythm: Normal rate and regular rhythm.     Pulses: Intact distal pulses.     Heart sounds: S1 normal and S2 normal. Murmur heard.  Blowing holosystolic murmur is present with a grade of 2/6 at  the apex.  Crescendo midsystolic murmur of grade 2/6 is also present.    No gallop.  Pulmonary:     Effort: Pulmonary effort is normal. No respiratory distress.     Breath sounds: Normal breath sounds. No wheezing, rhonchi or rales.  Musculoskeletal:     Right lower leg: Edema (trace ankle) present.     Left lower leg: Edema (trace ankle) present.  Neurological:     Mental Status: He is alert.     Laboratory examination:  No flowsheet data found. No flowsheet data found. Lipid Panel  No results found for: CHOL, TRIG, HDL, CHOLHDL, VLDL, LDLCALC, LDLDIRECT HEMOGLOBIN A1C No results found for:  HGBA1C, MPG TSH No results for input(s): TSH in the last 8760 hours.  External labs:  08/20/2020: Hb 13.8/HCT 40.8, platelets 160, normal indicis. Serum glucose 140 mg, BUN 20, creatinine 1.24, EGFR 60 [place], potassium 4.6. TSH normal at 0.92. Total cholesterol 186, triglycerides 73, HDL 105, LDL 68.  Medications and allergies   Allergies  Allergen Reactions   Escitalopram     Other reaction(s): felt weird and tired   Lisinopril Cough    Medication prior to this encounter:   Outpatient Medications Prior to Visit  Medication Sig Dispense Refill   carbidopa-levodopa (SINEMET IR) 25-100 MG tablet 2 at 7am, 1 at 11am, 1 at 4pm 360 tablet 1   indomethacin (INDOCIN) 50 MG capsule 3 (three) times daily as needed.      rosuvastatin (CRESTOR) 20 MG tablet Take 20 mg by mouth daily.   4   vitamin B-12 (CYANOCOBALAMIN) 500 MCG tablet Take 500 mcg by mouth daily.     amLODipine (NORVASC) 2.5 MG tablet TAKE 4 TABLETS BY MOUTH ONCE DAILY 112 tablet 0   COVID-19 mRNA bivalent vaccine, Pfizer, (PFIZER COVID-19 VAC BIVALENT) injection Inject into the muscle. 0.3 mL 0   doxycycline (VIBRAMYCIN) 100 MG capsule Take 100 mg by mouth 2 (two) times daily.     No facility-administered medications prior to visit.    FINAL MEDICATION AS OF TODAY:   Medications after current encounter Current Outpatient Medications  Medication Instructions   amLODipine (NORVASC) 5 mg, Oral, 2 times daily   carbidopa-levodopa (SINEMET IR) 25-100 MG tablet 2 at 7am, 1 at 11am, 1 at 4pm   COVID-19 mRNA bivalent vaccine, Pfizer, (PFIZER COVID-19 VAC BIVALENT) injection Intramuscular   doxycycline (VIBRAMYCIN) 100 mg, Oral, 2 times daily   indomethacin (INDOCIN) 50 MG capsule 3 times daily PRN   rosuvastatin (CRESTOR) 20 mg, Oral, Daily   vitamin B-12 (CYANOCOBALAMIN) 500 mcg, Oral, Daily   Radiology:   No results found.  Cardiac Studies:  Carotid artery duplex 09/21/2020:  Duplex suggests stenosis in the right  internal carotid artery (50-69%), upper end of spectrum.  Duplex suggests stenosis in the left internal carotid artery (50-69%).  Duplex suggests stenosis in the left external carotid artery (<50%).  Antegrade right vertebral artery flow. Antegrade left vertebral artery flow.  Follow up in six months is appropriate if clinically indicated.  Echocardiogram 09/21/2020:  Normal LV systolic function with visual EF 60-65%. Left ventricle cavity  is normal in size. Mild left ventricular hypertrophy. Normal global wall  motion. Normal diastolic filling pattern, normal LAP.  Left atrial cavity is mildly dilated. A lipomatous septum is present.  Mild (Grade I) mitral regurgitation.  Mild tricuspid regurgitation. No evidence of pulmonary hypertension.  Moderate pulmonic regurgitation.  No prior study for comparison.  Exercise treadmill stress test 10/05/2020: Exercise treadmill stress test  performed using Bruce protocol.  Patient reached 7.5 METS, and 68% of age predicted maximum heart rate.  Exercise capacity was fair.  No chest pain reported.  Submaximal heart rate and normal blood pressure response. Stress EKG revealed no ischemic changes. Inconclusive study due to submaximal heart rate response.   EKG:   11/14/2020: Sinus rhythm at a rate of 60 bpm.  Normal axis.  No evidence of ischemia or underlying injury pattern.  Compared to EKG 08/16/2020, no significant change.  Assessment     ICD-10-CM   1. Orthostatic hypotension  I95.1     2. Bilateral carotid artery stenosis  I65.23        Medications Discontinued During This Encounter  Medication Reason   amLODipine (NORVASC) 2.5 MG tablet Change in therapy     Meds ordered this encounter  Medications   amLODipine (NORVASC) 5 MG tablet    Sig: Take 1 tablet (5 mg total) by mouth in the morning and at bedtime.    Dispense:  180 tablet    Refill:  3     No orders of the defined types were placed in this encounter.  Recommendations:    Cody Dickerson is a 70 y.o.  fairly active Caucasian male with hypertension, Parkinson's disease which is well controlled, with an extensive evaluation for dizziness, including neurology and ENT felt to be unyielding, originally referred for cardiac evaluation of dizziness.  Patient presents for 49-monthfollow-up of dizziness and mild orthostasis.  Last office visit had advised patient to use conservative measures to improve dizziness.  He had undergone stress test and echocardiogram, details below.  Patient's dizziness has improved since last office visit, his fatigue is well with increased levodopa/carbidopa dosing.  He is increased fluid intake.  Although patient remains orthostatic in the office today he is not symptomatic from the standpoint.  Given that symptoms have improved we will hold off on repeat stress test at this time.  Blood pressure is well controlled. Patient takes amlodipine 5 mg BID which controls blood pressure well without causing patient to experience orthostatic symptoms, will continue this. I personally reviewed external labs, lipids are well controlled.  Patient is due for carotid artery surveillance, we will schedule him for duplex accordingly.  Follow-up in 6 months, sooner if needed, for hypertension, orthostatic hypotension, carotid artery stenosis.   CAlethia Berthold PA-C 02/13/2021, 10:30 AM Office: [phone]

## 2021-02-13 ENCOUNTER — Ambulatory Visit: Payer: Medicare Other | Admitting: Student

## 2021-02-13 ENCOUNTER — Other Ambulatory Visit: Payer: Self-pay

## 2021-02-13 ENCOUNTER — Encounter: Payer: Self-pay | Admitting: Student

## 2021-02-13 VITALS — BP 128/70 | HR 59 | Temp 98.0°F | Ht 67.0 in | Wt 163.8 lb

## 2021-02-13 DIAGNOSIS — I6523 Occlusion and stenosis of bilateral carotid arteries: Secondary | ICD-10-CM | POA: Diagnosis not present

## 2021-02-13 DIAGNOSIS — I951 Orthostatic hypotension: Secondary | ICD-10-CM

## 2021-02-13 MED ORDER — AMLODIPINE BESYLATE 5 MG PO TABS: 5.0000 mg | ORAL_TABLET | Freq: Two times a day (BID) | ORAL | 3 refills | Status: DC

## 2021-03-05 ENCOUNTER — Ambulatory Visit: Payer: Medicare Other

## 2021-03-05 ENCOUNTER — Other Ambulatory Visit: Payer: Self-pay

## 2021-03-05 DIAGNOSIS — I6523 Occlusion and stenosis of bilateral carotid arteries: Secondary | ICD-10-CM

## 2021-03-07 DIAGNOSIS — D485 Neoplasm of uncertain behavior of skin: Secondary | ICD-10-CM | POA: Diagnosis not present

## 2021-03-07 DIAGNOSIS — C44329 Squamous cell carcinoma of skin of other parts of face: Secondary | ICD-10-CM | POA: Diagnosis not present

## 2021-03-11 NOTE — Progress Notes (Signed)
Carotid artery duplex 03/05/2020: Duplex suggests stenosis in the right internal carotid artery (50-69%), upper end of the spectrum.  Duplex suggests stenosis in the left internal carotid artery (1-15%). Antegrade right vertebral artery flow. Antegrade left vertebral artery flow. Compared to 09/21/2020, no significant change in the right ICA stenosis, left ICA stenosis of 16 to 49% has now regressed. Follow up in six months is appropriate if clinically indicated.

## 2021-03-12 ENCOUNTER — Other Ambulatory Visit: Payer: Self-pay | Admitting: Student

## 2021-03-12 DIAGNOSIS — I6523 Occlusion and stenosis of bilateral carotid arteries: Secondary | ICD-10-CM

## 2021-04-09 DIAGNOSIS — Z85828 Personal history of other malignant neoplasm of skin: Secondary | ICD-10-CM | POA: Diagnosis not present

## 2021-04-09 DIAGNOSIS — C44329 Squamous cell carcinoma of skin of other parts of face: Secondary | ICD-10-CM | POA: Diagnosis not present

## 2021-05-30 ENCOUNTER — Ambulatory Visit: Payer: Medicare Other | Attending: Neurology | Admitting: Physical Therapy

## 2021-05-30 ENCOUNTER — Ambulatory Visit: Payer: Medicare Other

## 2021-05-30 ENCOUNTER — Ambulatory Visit: Payer: Medicare Other | Admitting: Occupational Therapy

## 2021-05-30 ENCOUNTER — Telehealth: Payer: Self-pay | Admitting: Physical Therapy

## 2021-05-30 DIAGNOSIS — R29818 Other symptoms and signs involving the nervous system: Secondary | ICD-10-CM

## 2021-05-30 DIAGNOSIS — R471 Dysarthria and anarthria: Secondary | ICD-10-CM

## 2021-05-30 DIAGNOSIS — G20 Parkinson's disease: Secondary | ICD-10-CM

## 2021-05-30 DIAGNOSIS — R278 Other lack of coordination: Secondary | ICD-10-CM

## 2021-05-30 DIAGNOSIS — R2689 Other abnormalities of gait and mobility: Secondary | ICD-10-CM | POA: Insufficient documentation

## 2021-05-30 NOTE — Therapy (Signed)
[facility] ?[address] ?[place], [place], [address] ?Phone: [phone]   Fax:  [phone] ? ?Patient Details  ?Name: Cody Dickerson ?MRN: [id number] ?Date of Birth: [birth year] ?Referring Provider:  Dr. Wells Guiles Tat  ? ?Encounter Date: 05/30/2021 ? ?Speech Therapy Parkinson's Disease Screen  ? ?Decibel Level today: upper 60s dB  (WNL=70-72 dB) with sound level meter 30cm away from pt's masked mouth. Pt's conversational volume has mildly decreased. Pt stated "I'm not as loud as I used to be." ? ?Pt does not report difficulty with swallowing solids/liquids warranting further evaluation. Pt stated "It's a little more difficult swallowing saliva" as pt is experiencing dry mouth but now has medication that has been helpful.  ? ?Pt is experiencing some changes in focus/concentration (unable to multitask) and changes in memory over last several years. Diagnosed with MCI last September.  ? ?Pt would benefit from speech-language eval for dysarthria and cognition - please order via EPIC or call [phone] to schedule. ? ?Cody Dickerson, CCC-SLP ?05/30/2021, 10:36 AM ? ?[address] ?[place] ?[place], [place], [address] ?Phone: [phone]   Fax:  [phone] ?

## 2021-05-30 NOTE — Therapy (Signed)
[facility] ?[address] ?[place], [place], [address] ?Phone: [phone]   Fax:  [phone] ? ?Patient Details  ?Name: Cody Dickerson ?MRN: [id number] ?Date of Birth: 30-Jan-1951 ?Referring Provider:  Lawerance Cruel, MD ? ?Encounter Date: 05/30/2021 ? ?Occupational Therapy Parkinson's Disease Screen ? ?Hand dominance:  right  ? ?Physical Performance Test item #4 (donning/doffing jacket):  14.59 sec ? ?Fastening/unfastening 3 buttons in:  40.06 sec ? ?9-hole peg test:    RUE  38.63 sec        LUE  34.19 sec ? ?Change in ability to perform ADLs/IADLs:  pt reports that ADLs slower, decline in writing and buttons and shaving ? ?Pt would benefit from occupational therapy evaluation due to  incr difficulty for ADLs. ? ? ? ? Vianne Bulls, [place] ?05/30/2021, 10:24 AM ? ?[place] ?[address] ?[place], [place], [address] ?Phone: [phone]   Fax:  [phone] ? ?Vianne Bulls, OTR/L ?[address] ?[place], [place]  [address] ?[phone] phone ?[phone] ?05/30/21 10:24 AM ? ? ?

## 2021-05-30 NOTE — Therapy (Signed)
[place] ?[address] ?[place], [place], [address] ?Phone: [phone]   Fax:  [phone] ? ?Patient Details  ?Name: Cody Dickerson ?MRN: [id number] ?Date of Birth: 1950-12-22 ?Referring Provider:  Lawerance Cruel, MD ? ?Encounter Date: 05/30/2021 ? ?Physical Therapy Parkinson's Disease Screen ? ?Timed Up and Go test: 9.25 sec ? ?10 meter walk test:8.94 sec = 3.67 ft/sec (noted decr arm swing/trunk rotation) ? ?5 time sit to stand test: 11.84 sec no UE support (previously 13.35 seconds) ? ?Reports things have deteriorated since he was last here. Does not focus as well and does not get around as good (balance is not good). Had some close falls in the shower. Doing the PD cycle classes every week.  ? ?Patient would benefit from Physical Therapy evaluation due to more reports of imbalance and difficulties with walking.  ? ? ?Arliss Journey, PT, DPT  ?05/30/2021, 10:11 AM ? ?[place] ?[address] ?[place], [place], [address] ?Phone: [phone]   Fax:  [phone] ?

## 2021-05-30 NOTE — Telephone Encounter (Signed)
Dr. Carles Collet, ? ?Cody Dickerson was seen for PD screens on 05/30/21.   ? ?The patient would benefit from an OT evaluation for incr difficulties with ADLs, PT evaluation for imbalance, and speech therapy for dysarthria and cognition. ? ?If you agree, please place an order in [facility] workque in [facility] or fax the order to [phone]. ? ?Thank you, ?Janann August, PT, DPT ?05/30/21 10:48 AM  ? ? ?[place] [address] ?[place], [place]  [address] ?Phone:  [phone] ?Fax:  [phone] ? ?

## 2021-06-03 DIAGNOSIS — Z1211 Encounter for screening for malignant neoplasm of colon: Secondary | ICD-10-CM | POA: Diagnosis not present

## 2021-06-03 DIAGNOSIS — R2689 Other abnormalities of gait and mobility: Secondary | ICD-10-CM | POA: Diagnosis not present

## 2021-06-03 DIAGNOSIS — G3184 Mild cognitive impairment, so stated: Secondary | ICD-10-CM | POA: Diagnosis not present

## 2021-06-04 ENCOUNTER — Ambulatory Visit: Payer: Medicare Other | Attending: Family Medicine | Admitting: Speech Pathology

## 2021-06-04 DIAGNOSIS — R29898 Other symptoms and signs involving the musculoskeletal system: Secondary | ICD-10-CM | POA: Insufficient documentation

## 2021-06-04 DIAGNOSIS — R471 Dysarthria and anarthria: Secondary | ICD-10-CM

## 2021-06-04 DIAGNOSIS — R29818 Other symptoms and signs involving the nervous system: Secondary | ICD-10-CM | POA: Diagnosis not present

## 2021-06-04 DIAGNOSIS — R41841 Cognitive communication deficit: Secondary | ICD-10-CM | POA: Diagnosis not present

## 2021-06-04 DIAGNOSIS — R251 Tremor, unspecified: Secondary | ICD-10-CM | POA: Diagnosis not present

## 2021-06-04 DIAGNOSIS — M6281 Muscle weakness (generalized): Secondary | ICD-10-CM | POA: Diagnosis not present

## 2021-06-04 DIAGNOSIS — R2689 Other abnormalities of gait and mobility: Secondary | ICD-10-CM | POA: Insufficient documentation

## 2021-06-04 DIAGNOSIS — R2681 Unsteadiness on feet: Secondary | ICD-10-CM | POA: Diagnosis not present

## 2021-06-04 DIAGNOSIS — R41840 Attention and concentration deficit: Secondary | ICD-10-CM | POA: Diagnosis not present

## 2021-06-04 DIAGNOSIS — R278 Other lack of coordination: Secondary | ICD-10-CM | POA: Diagnosis not present

## 2021-06-04 DIAGNOSIS — R293 Abnormal posture: Secondary | ICD-10-CM | POA: Diagnosis not present

## 2021-06-04 NOTE — Therapy (Signed)
?OUTPATIENT SPEECH LANGUAGE PATHOLOGY PARKINSON'S EVALUATION ? ? ?Patient Name: Cody Dickerson ?MRN: [id number] ?DOB:1951/01/30, 71 y.o., male ?Today's Date: 06/04/2021 ? ?PCP: Lawerance Cruel, MD ?REFERRING PROVIDER: Carles Collet Eustace Quail, DO  ? ? End of Session - 06/04/21 1046   ? ? Visit Number 1   ? Number of Visits 13   ? Date for SLP Re-Evaluation 07/02/21   ? Authorization Type Medicare   ? Progress Note Due on Visit 10   ? SLP Start Time 1015   ? SLP Stop Time  1100   ? SLP Time Calculation (min) 45 min   ? Activity Tolerance Patient tolerated treatment well   ? ?  ?  ? ?  ? ? ?Past Medical History:  ?Diagnosis Date  ? Balance problem   ? Gout   ? Hypercholesterolemia   ? Hypertension   ? Small bowel obstruction ([place])   ? ?Past Surgical History:  ?Procedure Laterality Date  ? BOWEL BLOCKAGE  05/2019  ? ?Patient Active Problem List  ? Diagnosis Date Noted  ? Parkinson's disease ([place]) 02/06/2021  ? SBO (small bowel obstruction) ([place]) 07/06/2019  ? ? ?ONSET DATE: dx with Parkinson's December 2021; referral date 05/30/2021 ? ?REFERRING DIAG: G20 (ICD-10-CM) - Parkinson's disease ([place])  ? ?THERAPY DIAG:  ?Dysarthria and anarthria ? ?Cognitive communication deficit ? ?SUBJECTIVE:  ? ?SUBJECTIVE STATEMENT: ?"I have a really hard time focusing." ?Pt accompanied by: self ? ?PERTINENT HISTORY: Pt dx with PD in December 2021, no hx of ST services since diagnosis. Pt assessed by neuropsychology September 2022, dx with mild cognitive impairment.  ? ?PAIN:  ?Are you having pain? No ? ?FALLS: Has patient fallen in last 6 months?  Yes, Number of falls: 1, Comment: has PT eval for later this week ? ?LIVING ENVIRONMENT: ?Lives with: lives with their spouse Rise Paganini) ?Lives in: House/apartment ? ?PLOF:  ?Level of assistance: Independent with ADLs, Independent with IADLs ?Employment: Part-time employment ? ?PATIENT GOALS improve thinking ? ?OBJECTIVE:  ? ?DIAGNOSTIC FINDINGS: Neuropsycological testing with Dr. Nicole Kindred 11/07/2020 (testing  including RBANS, Neuropsychological Assessment Battery, MWCST and MoCA) ?Clinical Dementia Rating Raw Score 1.0; Descriptor MCI ?Quick Dementia Rating System Raw Score 2.5; Descriptor Very Mild Dementia  ? ?COGNITION: ?Overall cognitive status: Impaired ?Areas of impairment: Attention and Memory ?Comments: delayed processing, attention (selective) impacting memory (working, short term). Dx with mild cognitive impairment by neuropsych. Pt reports difficulties with multitasking, getting sidetracked during task completion, and word finding difficulties for past 10 years.  ? ?MOTOR SPEECH: ?Overall motor speech: Appears intact ?Level of impairment: N/A ?Respiration: thoracic breathing ?Phonation: normal ?Resonance: WFL ?Articulation: Appears intact ?Intelligibility: Intelligible ?Motor planning: Appears intact ?Motor speech errors: aware ?Interfering components: premorbid status ?Effective technique: increased vocal intensity ? ?ORAL MOTOR ASSESSMENT:   ?WFL ? ? ?OBJECTIVE ASSESSMENT: ? ?Measured when a sound level meter was placed 30 cm away from pt's mouth, 5 minutes of moderate to complex conversational speech was reduced today, at average low 60s dB (WNL= average 70-72dB) with range of 57-65 dB. Overall speech intelligibility for this listener in a quiet environment was not affected, at approximately 100%. Production of loud /a/ averaged 72 dB (range of 68 to 82) and usual mod cues needed for loudness.  ? ?Pt then rated effort level at 8/10 for production of loud /a/ (10=maximal effort). In reading task pt was asked to use the same amount of effort as with loud /a/. Loudness average with this increased effort was 68 dB (range of 59  to 72) with occasional mod cues for loudness. Pt would benefit from skilled ST in order to improve speech intelligibility and pt's QOL. ? ?Pt does not report difficulty with swallowing warranting further evaluation. ? ? ?PATIENT REPORTED OUTCOME MEASURES (PROM): ?Cognitive Function: 19  and Communication Effectiveness Survey: 31 ? ? ?TODAY'S TREATMENT:  ?Provide education on evaluation results, ST recommendations, voice changes with PD dx, addressing changes in cognitive functioning with ST. Pt provided with opportunity to ask any questions, all are answered at this time.  ? ? ?PATIENT EDUCATION: ?Education details: see above ?Person educated: Patient ?Education method: Explanation, Demonstration, and Handouts ?Education comprehension: verbalized understanding, returned demonstration, and needs further education ? ? ?HOME EXERCISE PROGRAM: ?Order Speak Out! workbook ? ? ?GOALS: ?Goals reviewed with patient? Yes ? ?SHORT TERM GOALS: Target date: 07/02/2021 ? ?Pt will verbalize and implement 2 attention and memory compensations to aid daily functioning given occasional min A over 2 sessions ?Baseline: n/a ?Goal status: INITIAL ? ?2.  Pt will maintain WNL conversational volume (70-72 dB) in 10+ minute simple conversation given occasional min A over 2 sessions ?Baseline: n/a ?Goal status: INITIAL ? ?3.  Pt will demonstrate ability to alternate attention successfully between conversation/structured tasks, using compensations, given occasional min A ?Baseline: n/a ?Goal status: INITIAL ? ? ?LONG TERM GOALS: Target date: 07/16/2021 ? ?Pt will report improved cognitive linguistic functioning via PROM by 5 points at last ST session ?Baseline: 96 ?Goal status: INITIAL ? ?2.  Pt will maintain WNL conversational volume (70-72 dB) in 20+ minute mod complex conversation given occasional min A over 2 sessions ?Baseline: n/a ?Goal status: INITIAL ? ?3.  Pt will report successful implementation, with benefit, of x4 attention and memory compensation/strategies to aid in daily functioning over 1 week period ?Baseline: n/a ?Goal status: INITIAL ? ? ?ASSESSMENT: ? ?CLINICAL IMPRESSION: ?Patient is a 71 y.o. 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 Pt reports significant difficulty with multitasking and tells ST if he does not "fully focus" on something then "poof, it's gone and I can't remember." These reports are supported by results from neuro-psych testing which ID'd attention, processing, and memory deficits. Pt also reports difficulties with mental flexibility or problem solving. Does well with routine but problems arise if unable to stick to routine (e.g. forgetting meds at normal time). Occasional difficulty with word finding, no overt word finding difficulties evidenced during today's evaluation. Pt demonstrates reduced vocal intensity throughout structured and unstructured speech tasks (avg 68 dB conversational speech). Would benefit from education on the mechanisms impacting speech production in Parkinson's pts so pt can avoid significant decline in communication abilities with progressive disease. ST will also target use of cognitive compensations/strategies to accommodate MCI and increase pt's overall functioning at work and home.  ? ?OBJECTIVE IMPAIRMENTS  ?Objective impairments include attention, memory, executive functioning, and dysarthria. These impairments are limiting patient from managing medications, managing appointments, managing finances, household responsibilities, and effectively communicating at home and in community.Factors affecting potential to achieve goals and functional outcome are medical prognosis.. Patient will benefit from skilled SLP services to address above impairments and improve overall function. ? ?REHAB POTENTIAL: Good ? ?PLAN: ?SLP FREQUENCY: 2x/week ? ?SLP DURATION: 6 weeks ? ?PLANNED INTERVENTIONS: Environmental controls, Cueing hierachy, Cognitive reorganization, Internal/external aids, Functional tasks, SLP instruction and feedback, and Compensatory strategies ? ? ? ?Su Monks, CF-SLP ?06/04/2021, 3:20 PM ? ? ? ?  ?

## 2021-06-04 NOTE — Patient Instructions (Signed)
SPEAK OUT! is a structured program targeting voice in patient's with Parkinson's. This program was developed by [employer]. It is evidence based and based on principles of motor learning. The nonprofit provides free materials to patients being treated by a Speak Out! certified SLP.   www.parkinsonvoiceproject.org for more information  Learn about Parkinson's webinar: https://parkinsonvoiceproject.org/education-training/monthly-webinar/ -- highly recommend watching this webinar!!  Order your stimulus booklet: [phone] Your provider: Shey Yott SLP, [facility]  This book is free!! They do offer a "pay if forward" option where you can donate to the non-profit organization so they can continue serving the Parkinson's population and providing these valuable resources to patients.    

## 2021-06-07 ENCOUNTER — Ambulatory Visit: Payer: Medicare Other | Admitting: Physical Therapy

## 2021-06-07 DIAGNOSIS — R29818 Other symptoms and signs involving the nervous system: Secondary | ICD-10-CM

## 2021-06-07 DIAGNOSIS — R293 Abnormal posture: Secondary | ICD-10-CM | POA: Diagnosis not present

## 2021-06-07 DIAGNOSIS — R278 Other lack of coordination: Secondary | ICD-10-CM

## 2021-06-07 DIAGNOSIS — R2689 Other abnormalities of gait and mobility: Secondary | ICD-10-CM

## 2021-06-07 DIAGNOSIS — R471 Dysarthria and anarthria: Secondary | ICD-10-CM | POA: Diagnosis not present

## 2021-06-07 DIAGNOSIS — R2681 Unsteadiness on feet: Secondary | ICD-10-CM | POA: Diagnosis not present

## 2021-06-07 DIAGNOSIS — R41841 Cognitive communication deficit: Secondary | ICD-10-CM | POA: Diagnosis not present

## 2021-06-07 NOTE — Therapy (Signed)
?OUTPATIENT PHYSICAL THERAPY NEURO EVALUATION ? ? ?Patient Name: Cody Dickerson ?MRN: [id number] ?DOB:[birth year], 71 y.o., male ?Today's Date: 06/07/2021 ? ?PCP: Lawerance Cruel, MD ?REFERRING PROVIDER: Ludwig Clarks, DO  ? ? PT End of Session - 06/07/21 1011   ? ? Visit Number 1   ? Number of Visits 9   ? Date for PT Re-Evaluation 08/06/21   due to potential delay in scheduling  ? Authorization Type Medicare   ? PT Start Time 0930   ? PT Stop Time 1009   ? PT Time Calculation (min) 39 min   ? Equipment Utilized During Treatment Gait belt   ? Activity Tolerance Patient tolerated treatment well   ? Behavior During Therapy [facility] for tasks assessed/performed   ? ?  ?  ? ?  ? ? ?Past Medical History:  ?Diagnosis Date  ? Balance problem   ? Gout   ? Hypercholesterolemia   ? Hypertension   ? Small bowel obstruction ([place])   ? ?Past Surgical History:  ?Procedure Laterality Date  ? BOWEL BLOCKAGE  05/2019  ? ?Patient Active Problem List  ? Diagnosis Date Noted  ? Parkinson's disease ([place]) 02/06/2021  ? SBO (small bowel obstruction) ([place]) 07/06/2019  ? ? ?ONSET DATE: 05/30/2021  ? ?REFERRING DIAG: G20 (ICD-10-CM) - Parkinson's disease ([place]) ? ?THERAPY DIAG:  ?Other lack of coordination ? ?Other symptoms and signs involving the nervous system ? ?Other abnormalities of gait and mobility ? ?Unsteadiness on feet ? ?Abnormal posture ? ?SUBJECTIVE:  ?                                                                                                                                                                                           ? ?SUBJECTIVE STATEMENT: ?Pt seen for PD eval due to more reports of feeling imbalanced. Feels like he is staggering a bit when he first gets up and walks. Pt has had a hx of dizziness, seen the cardiologist and they don't think its anything cardiac related. Going to the PD cycle classes 1x per week. Has not been doing his PT exercises from last bout of therapy.  ? ?Pt accompanied by: self ? ?PERTINENT  HISTORY: PMH: PD, dizziness ? ?PAIN:  ?Are you having pain? No ? ?PRECAUTIONS: Fall ? ?FALLS: Has patient fallen in last 6 months? Yes. Number of falls 2  (reports them as partial falls).  ? ?LIVING ENVIRONMENT: ?Lives with: lives with their spouse ?Lives in: House/apartment ?Stairs: Yes: External: 3 or 4 steps; can reach both ?Has following equipment at home: Single point cane and Grab bars in one shower  ? ?PLOF:  Independent ? ?PATIENT GOALS Wants to improve the balance.  ? ?OBJECTIVE:  ? ?COGNITION: ?Overall cognitive status: Within functional limits for tasks assessed ?  ?SENSATION: ?WFL ? ?COORDINATION: ?WFL for heel to shin  ? ? ?POSTURE: rounded shoulders and forward head ? ? ?MMT:   ? ?MMT Right ?06/07/2021 Left ?06/07/2021  ?Hip flexion 5/5 5/5  ?Hip extension    ?Hip abduction    ?Hip adduction    ?Hip internal rotation    ?Hip external rotation    ?Knee flexion 5/5 5/5  ?Knee extension 5/5 5/5  ?Ankle dorsiflexion 5/5 5/5  ?Ankle plantarflexion    ?Ankle inversion    ?Ankle eversion    ?(Blank rows = not tested) ? ?BED MOBILITY:  ?Pt reports no issues.  ? ?TRANSFERS: ?Assistive device utilized: None  ?Sit to stand: SBA ?Stand to sit: SBA ? ? ?GAIT: ?Gait pattern: step through pattern, decreased arm swing- Right, decreased arm swing- Left, decreased trunk rotation, and trunk flexed ?Distance walked: Clinic distances. ?Assistive device utilized: None ?Level of assistance: SBA ? ? ?FUNCTIONAL TESTs:  ?5 times sit to stand: 13.17 sec with no UE support, 2 episodes of BLE bracing against chair ? ?MCTSIB: Condition 1: 30 sec, condition 2: 30 sec, condition 3: 30 sec - mild postural sway, condition 4: 12 seconds  ? ?Gait speed: 8.25 sec no AD = 3.85 ft/sec ? ? [facility] PT Assessment - 06/07/21 0947   ? ?  ? Mini-BESTest  ? Sit To Stand Normal: Comes to stand without use of hands and stabilizes independently.   ? Rise to Toes Moderate: Heels up, but not full range (smaller than when holding hands), OR noticeable  instability for 3 s.   ? Stand on one leg (left) Moderate: < 20 s   2.28, 1.58  ? Stand on one leg (right) Moderate: < 20 s   1.78, 1.44  ? Stand on one leg - lowest score 1   ? Compensatory Stepping Correction - Forward Normal: Recovers independently with a single, large step (second realignement is allowed).   ? Compensatory Stepping Correction - Backward Moderate: More than one step is required to recover equilibrium   3 smaller steps  ? Compensatory Stepping Correction - Left Lateral Normal: Recovers independently with 1 step (crossover or lateral OK)   ? Compensatory Stepping Correction - Right Lateral Moderate: Several steps to recover equilibrium   2-3 steps  ? Stepping Corredtion Lateral - lowest score 1   ? Stance - Feet together, eyes open, firm surface  Normal: 30s   ? Stance - Feet together, eyes closed, foam surface  Moderate: < 30s   12 sec  ? Incline - Eyes Closed Normal: Stands independently 30s and aligns with gravity   ? Change in Gait Speed Normal: Significantly changes walkling speed without imbalance   ? Walk with head turns - Horizontal Moderate: performs head turns with reduction in gait speed.   ? Walk with pivot turns Normal: Turns with feet close FAST (< 3 steps) with good balance.   ? Step over obstacles Moderate: Steps over box but touches box OR displays cautious behavior by slowing gait.   ? Timed UP & GO with Dual Task Moderate: Dual Task affects either counting OR walking (>10%) when compared to the TUG without Dual Task.   ? Mini-BEST total score 20   ?  ? Timed Up and Go Test  ? Normal TUG (seconds) 9.53   ? Manual TUG (seconds) 10.15   ?  Cognitive TUG (seconds) 12.97   ? TUG Comments Scores >10% difference indicate increased fall risk/difficulty with dual tasking   ? ?  ?  ? ?  ? ? ? ?TODAY'S TREATMENT:  ?N/A at eval  ? ? ?PATIENT EDUCATION: ?Education details: Clinical findings, POC, areas to work on in therapy. Purpose of 3 balance systems  ?Person educated: Patient ?Education  method: Explanation ?Education comprehension: verbalized understanding ? ? ?HOME EXERCISE PROGRAM: ?Will review prior HEP from last bout of therapy and update as needed at next session.  ? ? ? ?GOALS: ?Goals reviewed with patient? Yes ? ?SHORT TERM GOALS: N/A  ? ?LONG TERM GOALS: Target date: 07/05/2021 ? ?Pt will be independent with PD/balance specific HEP in order to build upon functional gains made in therapy.  ?Baseline:  ?Goal status: INITIAL ? ?2.  Pt will recover posterior and R lateral balance in push and release test in 1 robust step independently, for improved balance recovery  ?Baseline: posterior = 3 steps, lateral to R = 2-3 steps ?Goal status: INITIAL ? ?3.  Pt will improve miniBEST score to at least a 23/28 in order to demo decr fall risk.  ?Baseline: 20/28 ?Goal status: INITIAL ? ?4.  Pt will improve TUG and TUG cog score to less than or equal to 10% difference for improved dual task/decreased fall risk.  ?Baseline: TUG 9.53, cog TUG: 12.97 ?Goal status: INITIAL ? ?5.  Pt will improve condition 4 of mCTSIB to at least 20 sec in order to demo improved vestibular input for balance.  ?Baseline: 12 sec ?Goal status: INITIAL ? ? ?ASSESSMENT: ? ?CLINICAL IMPRESSION: ?Patient is a 71 year old male referred to Neuro OPPT for PD.   Pt's PMH is significant for: PD, dizziness. The following deficits were present during the exam: impaired balance, decr timing/coordination of gait, decr functional strength, postural abnormalities. Based on miniBEST, pt is an incr risk for falls. Based on TUG/cog TUG pt demonstrates impairments with dual tasking and with pt holding for 12 sec of condition 4 of mCTSIB, this indicates decr vestibular input for balance. Pt would benefit from skilled PT to address these impairments and functional limitations to maximize functional mobility independence and decr fall risk.  ? ? ?OBJECTIVE IMPAIRMENTS Abnormal gait, decreased activity tolerance, decreased balance, decreased coordination,  decreased strength, dizziness, impaired flexibility, and postural dysfunction.  ? ?ACTIVITY LIMITATIONS community activity.  ? ?PERSONAL FACTORS Past/current experiences, Time since onset of injury/illness/exac

## 2021-06-11 ENCOUNTER — Encounter: Payer: Self-pay | Admitting: Occupational Therapy

## 2021-06-11 ENCOUNTER — Ambulatory Visit: Payer: Medicare Other | Admitting: Occupational Therapy

## 2021-06-11 DIAGNOSIS — R471 Dysarthria and anarthria: Secondary | ICD-10-CM | POA: Diagnosis not present

## 2021-06-11 DIAGNOSIS — R278 Other lack of coordination: Secondary | ICD-10-CM | POA: Diagnosis not present

## 2021-06-11 DIAGNOSIS — R2689 Other abnormalities of gait and mobility: Secondary | ICD-10-CM

## 2021-06-11 DIAGNOSIS — R29818 Other symptoms and signs involving the nervous system: Secondary | ICD-10-CM | POA: Diagnosis not present

## 2021-06-11 DIAGNOSIS — R2681 Unsteadiness on feet: Secondary | ICD-10-CM | POA: Diagnosis not present

## 2021-06-11 DIAGNOSIS — R293 Abnormal posture: Secondary | ICD-10-CM | POA: Diagnosis not present

## 2021-06-11 DIAGNOSIS — R41841 Cognitive communication deficit: Secondary | ICD-10-CM | POA: Diagnosis not present

## 2021-06-11 DIAGNOSIS — R29898 Other symptoms and signs involving the musculoskeletal system: Secondary | ICD-10-CM

## 2021-06-11 DIAGNOSIS — R41840 Attention and concentration deficit: Secondary | ICD-10-CM

## 2021-06-11 NOTE — Therapy (Signed)
?OUTPATIENT OCCUPATIONAL THERAPY PARKINSON'S EVALUATION ? ?Patient Name: Cody Dickerson ?MRN: [id number] ?DOB:1950-06-06, 71 y.o., male ?Today's Date: 06/11/2021 ? ?PCP: Lawerance Cruel, MD ?REFERRING PROVIDER: Lawerance Cruel, MD ? ? OT End of Session - 06/11/21 1455   ? ? Visit Number 1   ? Number of Visits 17   ? Date for OT Re-Evaluation 08/10/21   ? Authorization Type Medicare & AARP, covered at 100%   ? Authorization - Visit Number 1   ? Authorization - Number of Visits 10   ? Progress Note Due on Visit 10   ? OT Start Time 1323   ? OT Stop Time 1402   ? OT Time Calculation (min) 39 min   ? Activity Tolerance Patient tolerated treatment well   ? Behavior During Therapy [facility] for tasks assessed/performed   ? ?  ?  ? ?  ? ? ?Past Medical History:  ?Diagnosis Date  ? Balance problem   ? Gout   ? Hypercholesterolemia   ? Hypertension   ? Small bowel obstruction ([place])   ? ?Past Surgical History:  ?Procedure Laterality Date  ? BOWEL BLOCKAGE  05/2019  ? ?Patient Active Problem List  ? Diagnosis Date Noted  ? Parkinson's disease ([place]) 02/06/2021  ? SBO (small bowel obstruction) ([place]) 07/06/2019  ? ? ?ONSET DATE: 05/30/21 (screen date) ? ?REFERRING DIAG: Dr. Wells Guiles Tat ? ?THERAPY DIAG:  ?Other lack of coordination ? ?Other symptoms and signs involving the nervous system ? ?Other symptoms and signs involving the musculoskeletal system ? ?Abnormal posture ? ?Unsteadiness on feet ? ?Attention and concentration deficit ? ?Other abnormalities of gait and mobility ? ?SUBJECTIVE:  ? ?SUBJECTIVE STATEMENT: ?Pt reports that ADLs are slower, more difficulty with writing, buttoning, shaving. ?Pt accompanied by: self ? ?PERTINENT HISTORY:  Hypercholesterolemia, HTN, hx of gout, hx small bowel blockage, hx of falls, hx of dizziness ? ?PRECAUTIONS: Fall ? ?WEIGHT BEARING RESTRICTIONS No ? ?PAIN:  ?Are you having pain? No ? ?FALLS: Has patient fallen in last 6 months? Yes. Number of falls "a couple partial" able to grab hold of  something to regain balance ? ?LIVING ENVIRONMENT: ?Lives with: lives with their spouse ? 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 ?PATIENT GOALS  improve writing, buttoning, shaving ? ?OBJECTIVE:  ? ?HAND DOMINANCE: Right ? ?ADLs: ? ?Transfers/ambulation related to ADLs:  difficulty getting up from floor, difficulty getting in/out of car ?Eating: difficulty with peas  ?Grooming: difficulty shaving (has gotten electric razor, but hasn't used yet), difficulty brushing teeth ?UB Dressing: incr time, incr time with buttons (particularly with sleeves) ?LB Dressing: min difficulty with tying shoes (has to tie more frequently due to not staying tied) ?Toileting: mod I ?Bathing: LUE doesn't wash hair as well ?Tub Shower transfers: incr time, careful, 2 grab bars ?Equipment: Grab bars ? ? ?IADLs: ?Shopping: wife does most, pt is able to perform ?Light housekeeping: pt does yardwork (mows), difficulty picking items off the ground, vacuuming--little back pain ?Meal Prep: pours with 2 hands ?Community mobility: driving--has to focus ?Medication management: independent ?Financial management: independent--online and checks.  ?Handwriting: 100% legible and Mild micrographia ? ?MOBILITY STATUS: Independent and decr balance--reports difficulty/LOB with head movements, hx of falls/near falls ? ?POSTURE COMMENTS:  ?rounded shoulders, forward head,  and L shoulder elevated ? ?FUNCTIONAL OUTCOME MEASURES: ?Standing functional reach: Right: 10 inches; Left: 8.5 inches ?Fastening/unfastening 3 buttons: 37.00sec ?Physical performance test: PPT#2 (simulated eating) 12.13sec holds spoon at the end & PPT#4 (donning/doffing jacket): 15.18  sec, feet close  together ? ?COORDINATION: ?9 Hole Peg test: Right: 28.75 sec; Left: 29.09 sec ?Box and Blocks:  Right 38blocks, Left 41blocks ?Tremors: Right, Left, and pt reports minimal tremors, LUE>RUE ? ?UE ROM:   BUEs grossly WNL with R shoulder flex 130* with -30* elbow, L shoulder flex 130* with -30* elbow ext ? ? ?MUSCLE TONE: RUE: Mild and Rigidity and LUE: Mild and Rigidity  RUE rigidity>LUE ? ?COGNITION: ?Overall cognitive status: Impaired: Bradyphrenia: yes ?Pt reports decr focus which causes memory difficulties, pt reports decr problem solving.  Pt reports  that he has trouble with changes in routine.  Impulsive with movement at times. ? ? ?OBSERVATIONS: Bradykinesia ? ? ?TODAY'S TREATMENT:  ?N/a ? ? ?PATIENT EDUCATION: ?Education details: OT eval results and POC ?Person educated: Patient ?Education method: Explanation ?Education comprehension: verbalized understanding ? ? ?HOME EXERCISE PROGRAM: ?Not yet issued ? ?ASSESSMENT: ? ?CLINICAL IMPRESSION: ?Patient is a 71 y.o. male who was seen today for occupational therapy evaluation for Parkinson's Disease.  During multi-disciplinary PD screen on 05/30/21, occupational therapy was recommended due to incr difficulty with ADLs.   Pt with PMH that includes Hypercholesterolemia, HTN, hx of gout, hx small bowel blockage, hx of falls, hx of dizziness.  Pt presents with bradykinesia, rigidity, decr coordination, abnormal posture, decr balance for ADLs, and cognitive deficits.  Pt would benefit from occupational therapy to address these deficits in order to improve ADL/IADL performance, improve quality of life, update PD-specific HEP, and prevent future complications.   ? ?PERFORMANCE DEFICITS in functional skills including ADLs, IADLs, coordination, dexterity, tone, ROM, FMC, GMC, mobility, balance, decreased knowledge of use of DME, and UE functional use, cognitive skills including attention, memory, and problem solving, and psychosocial skills including  n/a .  ? ?IMPAIRMENTS  are limiting patient from ADLs, IADLs, and leisure.  ? ?COMORBIDITIES may have co-morbidities  that affects occupational performance. Patient will benefit from skilled OT to address above impairments and improve overall function. ? ?MODIFICATION OR ASSISTANCE TO COMPLETE EVALUATION: No modification of tasks or assist necessary to complete an evaluation. ? ?OT OCCUPATIONAL PROFILE AND HISTORY: Detailed assessment: Review of records and additional review of physical, cognitive, psychosocial history related to current functional performance. ? ?CLINICAL DECISION MAKING: Moderate - several treatment options, min-mod task modification necessary ? ?REHAB POTENTIAL: Good ? ?EVALUATION COMPLEXITY: Low ? ? ? ? ?GOALS: ?Potential Goals/POC reviewed with patient? Yes ? ?SHORT TERM GOALS: Target date: 07/09/2021 ? ?Pt will be independent with updated PD-specific HEP. ? ?Goal status: INITIAL ? ?2.  Pt will verbalize understanding of adaptive strategies to incr ease/safety with ADLs/IADLs (including writing, shaving, buttoning, tying shoes, washing hair, eating, putting on jacket) ? ?Goal status: INITIAL ? ? ?LONG TERM GOALS: Target date: 08/06/2021 ? ?Pt will improve LUE functional reach and balance for ADLs/IADLs as shown by improving standing functional reach to at least 10" with LUE. ?Baseline:  8.5" ?Goal status: INITIAL ? ?2.  Pt will improve functional reach/coordination for ADLs as shown by improving score on box and blocks test by at least 3 with RUE. ?Baseline:  38 blocks ?Goal status: INITIAL ? ?3.  Pt will improve functional reach/coordination for ADLs as shown by improving score on box and blocks test by at least 3 with LUE. ?  Baseline:  41 blocks ?Goal status: INITIAL ? ?4.  Pt will be able to reach overhead with at least -20* R elbow extension. ?Baseline: -30* ?Goal status: INITIAL ? ?5.  Pt will be able to reach overhead with at least -20* L elbow extension. ?Baseline:   -30 ?Goal status: INITIAL ? ? ? ?PLAN: ?OT  FREQUENCY: 2x/week  ? ?OT DURATION: 6 weeks  (or 13 visits over 8 weeks due to scheduling) ? ?PLANNED INTERVENTIONS: self care/ADL training, therapeutic exercise, therapeutic activity, neuromuscular re-education, manual therapy, pas

## 2021-06-12 ENCOUNTER — Ambulatory Visit: Payer: Medicare Other | Admitting: Speech Pathology

## 2021-06-12 DIAGNOSIS — R41841 Cognitive communication deficit: Secondary | ICD-10-CM | POA: Diagnosis not present

## 2021-06-12 DIAGNOSIS — R2681 Unsteadiness on feet: Secondary | ICD-10-CM | POA: Diagnosis not present

## 2021-06-12 DIAGNOSIS — R471 Dysarthria and anarthria: Secondary | ICD-10-CM | POA: Diagnosis not present

## 2021-06-12 DIAGNOSIS — R293 Abnormal posture: Secondary | ICD-10-CM | POA: Diagnosis not present

## 2021-06-12 DIAGNOSIS — R278 Other lack of coordination: Secondary | ICD-10-CM | POA: Diagnosis not present

## 2021-06-12 DIAGNOSIS — R29818 Other symptoms and signs involving the nervous system: Secondary | ICD-10-CM | POA: Diagnosis not present

## 2021-06-12 NOTE — Therapy (Signed)
?OUTPATIENT SPEECH LANGUAGE PATHOLOGY TREATMENT NOTE ? ? ?Patient Name: Cody Dickerson ?MRN: [id number] ?DOB:1950-11-20, 71 y.o., male ?Today's Date: 06/12/2021 ? ?PCP: Lawerance Cruel, MD ?REFERRING PROVIDER: Ludwig Clarks, DO  ? ?END OF SESSION:  ? End of Session - 06/12/21 1102   ? ? Visit Number 2   ? Number of Visits 13   ? Date for SLP Re-Evaluation 07/02/21   ? Authorization Type Medicare   ? Progress Note Due on Visit 10   ? SLP Start Time 1100   ? SLP Stop Time  1145   ? SLP Time Calculation (min) 45 min   ? Activity Tolerance Patient tolerated treatment well   ? ?  ?  ? ?  ? ? ?Past Medical History:  ?Diagnosis Date  ? Balance problem   ? Gout   ? Hypercholesterolemia   ? Hypertension   ? Small bowel obstruction ([place])   ? ?Past Surgical History:  ?Procedure Laterality Date  ? BOWEL BLOCKAGE  05/2019  ? ?Patient Active Problem List  ? Diagnosis Date Noted  ? Parkinson's disease ([employer]) 02/06/2021  ? SBO (small bowel obstruction) ([place]) 07/06/2019  ? ? ?ONSET DATE: dx with Parkinson's December 2021; referral date 05/30/2021 ? ?REFERRING DIAG: G20 (ICD-10-CM) - Parkinson's disease ([place])  ? ?THERAPY DIAG:  ?Dysarthria and anarthria ? ?Cognitive communication deficit ? ?SUBJECTIVE: "I got my book yesterday." ? ?PAIN:  ?Are you having pain? No ? ?OBJECTIVE:  ? ?TODAY'S TREATMENT: ?Skilled Treatment:  ?06/12/21: Introduced pt to the Speak Out! methodology. Reviewed all exercises and demonstrated for pt. Target improving vocal quality and increasing intensity through progressively difficult speech tasks using Speak Out! program, lesson 1. ST leads pt through exercises providing usual model prior to pt execution. Rare ? min-A required to achieve target dB this date. Averages this date: loud "ah" 91 dB; reading 77 dB; cognitive speech task 74 dB. Conversational sample of approx 5 minutes, pt averages 65 dB with usual min-A to increase intensity.  ?  ?  ?PATIENT EDUCATION: ?Education details: see above ?Person educated:  Patient ?Education method: Explanation, Demonstration, and Handouts ?Education comprehension: verbalized understanding, returned demonstration, and needs further education ?  ?  ?HOME EXERCISE PROGRAM: ?Order Speak Out! workbook ?  ?  ?GOALS: ?Goals reviewed with patient? Yes ?  ?SHORT TERM GOALS: Target date: 07/02/2021 ?  ?Pt will verbalize and implement 2 attention and memory compensations to aid daily functioning given occasional min A over 2 sessions ?Baseline: n/a ?Goal status: ONGOING ?  ?2.  Pt will maintain WNL conversational volume (70-72 dB) in 10+ minute simple conversation given occasional min A over 2 sessions ?Baseline: n/a ?Goal status: ONGOING ?  ?3.  Pt will demonstrate ability to alternate attention successfully between conversation/structured tasks, using compensations, given occasional min A ?Baseline: n/a ?Goal status: ONGOING ?  ?  ?LONG TERM GOALS: Target date: 07/16/2021 ?  ?Pt will report improved cognitive linguistic functioning via PROM by 5 points at last ST session ?Baseline: 96 ?Goal status: ONGOING ?  ?2.  Pt will maintain WNL conversational volume (70-72 dB) in 20+ minute mod complex conversation given occasional min A over 2 sessions ?Baseline: n/a ?Goal status: ONGOING ?  ?3.  Pt will report successful implementation, with benefit, of x4 attention and memory compensation/strategies to aid in daily functioning over 1 week period ?Baseline: n/a ?Goal status: ONGOING ?  ?  ?ASSESSMENT: ?  ?CLINICAL IMPRESSION: ?Patient is a 71 y.o. M who was seen today for  hypokinetic dysphonia and cognitive communication deficits in areas of attention, memory, and delayed processing. Pt reports significant difficulty with multitasking and tells ST if he does not "fully focus" on something then "poof, it's gone and I can't remember." These reports are supported by results from neuro-psych testing which ID'd attention, processing, and memory deficits. Pt also reports difficulties with mental flexibility  or problem solving. Does well with routine but problems arise if unable to stick to routine (e.g. forgetting meds at normal time). Occasional difficulty with word finding, no overt word finding difficulties evidenced during today's evaluation. Pt demonstrates reduced vocal intensity throughout structured and unstructured speech tasks (avg 68 dB conversational speech). Would benefit from education on the mechanisms impacting speech production in Parkinson's pts so pt can avoid significant decline in communication abilities with progressive disease. ST will also target use of cognitive compensations/strategies to accommodate MCI and increase pt's overall functioning at work and home.  ?  ?OBJECTIVE IMPAIRMENTS  ?Objective impairments include attention, memory, executive functioning, and dysarthria. These impairments are limiting patient from managing medications, managing appointments, managing finances, household responsibilities, and effectively communicating at home and in community.Factors affecting potential to achieve goals and functional outcome are medical prognosis.. Patient will benefit from skilled SLP services to address above impairments and improve overall function. ?  ?REHAB POTENTIAL: Good ?  ?PLAN: ?SLP FREQUENCY: 2x/week ?  ?SLP DURATION: 6 weeks ?  ?PLANNED INTERVENTIONS: Environmental controls, Cueing hierachy, Cognitive reorganization, Internal/external aids, Functional tasks, SLP instruction and feedback, and Compensatory strategies ?  ?  ?  ?Su Monks, CF-SLP ?06/12/2021, 11:03 AM ?  ?

## 2021-06-14 ENCOUNTER — Ambulatory Visit: Payer: Medicare Other | Admitting: Physical Therapy

## 2021-06-14 ENCOUNTER — Ambulatory Visit: Payer: Medicare Other | Admitting: Speech Pathology

## 2021-06-14 DIAGNOSIS — R278 Other lack of coordination: Secondary | ICD-10-CM | POA: Diagnosis not present

## 2021-06-14 DIAGNOSIS — R293 Abnormal posture: Secondary | ICD-10-CM

## 2021-06-14 DIAGNOSIS — R2681 Unsteadiness on feet: Secondary | ICD-10-CM

## 2021-06-14 DIAGNOSIS — R41841 Cognitive communication deficit: Secondary | ICD-10-CM

## 2021-06-14 DIAGNOSIS — R2689 Other abnormalities of gait and mobility: Secondary | ICD-10-CM

## 2021-06-14 DIAGNOSIS — R29818 Other symptoms and signs involving the nervous system: Secondary | ICD-10-CM | POA: Diagnosis not present

## 2021-06-14 DIAGNOSIS — R471 Dysarthria and anarthria: Secondary | ICD-10-CM | POA: Diagnosis not present

## 2021-06-14 NOTE — Therapy (Signed)
?OUTPATIENT SPEECH LANGUAGE PATHOLOGY TREATMENT NOTE ? ? ?Patient Name: Cody Dickerson ?MRN: [id number] ?DOB:November 21, 1950, 71 y.o., male ?Today's Date: 06/14/2021 ? ?PCP: Lawerance Cruel, MD ?REFERRING PROVIDER: Ludwig Clarks, DO  ? ?END OF SESSION:  ? End of Session - 06/14/21 1226   ? ? Visit Number 3   ? Number of Visits 13   ? Date for SLP Re-Evaluation 07/02/21   ? Authorization Type Medicare   ? Progress Note Due on Visit 10   ? SLP Start Time 1230   ? SLP Stop Time  1315   ? SLP Time Calculation (min) 45 min   ? Activity Tolerance Patient tolerated treatment well   ? ?  ?  ? ?  ? ? ? ?Past Medical History:  ?Diagnosis Date  ? Balance problem   ? Gout   ? Hypercholesterolemia   ? Hypertension   ? Small bowel obstruction ([place])   ? ?Past Surgical History:  ?Procedure Laterality Date  ? BOWEL BLOCKAGE  05/2019  ? ?Patient Active Problem List  ? Diagnosis Date Noted  ? Parkinson's disease ([place]) 02/06/2021  ? SBO (small bowel obstruction) ([place]) 07/06/2019  ? ? ?ONSET DATE: dx with Parkinson's December 2021; referral date 05/30/2021 ? ?REFERRING DIAG: G20 (ICD-10-CM) - Parkinson's disease ([place])  ? ?THERAPY DIAG:  ?Cognitive communication deficit ? ?Dysarthria and anarthria ? ?SUBJECTIVE: "Been working on things" ? ?PAIN:  ?Are you having pain? No ? ?OBJECTIVE:  ? ?TODAY'S TREATMENT: ?Skilled Treatment:  ?06/14/21: Pt reports doing lesson 2 of Speak Out previous day. Pt reports difficulty knowing if he's meeting sound levels. Briefly reviewed exercises with usual max-A required to meet target dB in effort to calibrate pt's perception of effort and loudness required during home practice. Target attention and memory compensations through education this date. Pt reports difficulty with attention, multitasking, and processing complex or novel information. ST provided education on compensations and strategies pt can implement based on pt's daily activities and challenges. Pt endorses understanding.  ? ?06/12/21: Introduced  pt to the Speak Out! methodology. Reviewed all exercises and demonstrated for pt. Target improving vocal quality and increasing intensity through progressively difficult speech tasks using Speak Out! program, lesson 1. ST leads pt through exercises providing usual model prior to pt execution. Rare ? min-A required to achieve target dB this date. Averages this date: loud "ah" 91 dB; reading 77 dB; cognitive speech task 74 dB. Conversational sample of approx 5 minutes, pt averages 65 dB with usual min-A to increase intensity.  ?  ?  ?PATIENT EDUCATION: ?Education details: see above ?Person educated: Patient ?Education method: Explanation, Demonstration, and Handouts ?Education comprehension: verbalized understanding, returned demonstration, and needs further education ?  ?  ?HOME EXERCISE PROGRAM: ?Order Speak Out! workbook ?  ?  ?GOALS: ?Goals reviewed with patient? Yes ?  ?SHORT TERM GOALS: Target date: 07/02/2021 ?  ?Pt will verbalize and implement 2 attention and memory compensations to aid daily functioning given occasional min A over 2 sessions ?Baseline: n/a ?Goal status: ONGOING ?  ?2.  Pt will maintain WNL conversational volume (70-72 dB) in 10+ minute simple conversation given occasional min A over 2 sessions ?Baseline: n/a ?Goal status: ONGOING ?  ?3.  Pt will demonstrate ability to alternate attention successfully between conversation/structured tasks, using compensations, given occasional min A ?Baseline: n/a ?Goal status: ONGOING ?  ?  ?LONG TERM GOALS: Target date: 07/16/2021 ?  ?Pt will report improved cognitive linguistic functioning via PROM by 5 points at last  ST session ?Baseline: 96 ?Goal status: ONGOING ?  ?2.  Pt will maintain WNL conversational volume (70-72 dB) in 20+ minute mod complex conversation given occasional min A over 2 sessions ?Baseline: n/a ?Goal status: ONGOING ?  ?3.  Pt will report successful implementation, with benefit, of x4 attention and memory compensation/strategies to aid in  daily functioning over 1 week period ?Baseline: n/a ?Goal status: ONGOING ?  ?  ?ASSESSMENT: ?  ?CLINICAL IMPRESSION: ?Patient is a 71 y.o. 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 Pt reports significant difficulty with multitasking and tells ST if he does not "fully focus" on something then "poof, it's gone and I can't remember." These reports are supported by results from neuro-psych testing which ID'd attention, processing, and memory deficits. Pt also reports difficulties with mental flexibility or problem solving. Does well with routine but problems arise if unable to stick to routine (e.g. forgetting meds at normal time). Occasional difficulty with word finding, no overt word finding difficulties evidenced during today's evaluation. Pt demonstrates reduced vocal intensity throughout structured and unstructured speech tasks (avg 68 dB conversational speech). Would benefit from education on the mechanisms impacting speech production in Parkinson's pts so pt can avoid significant decline in communication abilities with progressive disease. ST will also target use of cognitive compensations/strategies to accommodate MCI and increase pt's overall functioning at work and home.  ?  ?OBJECTIVE IMPAIRMENTS  ?Objective impairments include attention, memory, executive functioning, and dysarthria. These impairments are limiting patient from managing medications, managing appointments, managing finances, household responsibilities, and effectively communicating at home and in community.Factors affecting potential to achieve goals and functional outcome are medical prognosis.. Patient will benefit from skilled SLP services to address above impairments and improve overall function. ?  ?REHAB POTENTIAL: Good ?  ?PLAN: ?SLP FREQUENCY: 2x/week ?  ?SLP DURATION: 6 weeks ?  ?PLANNED INTERVENTIONS: Environmental controls, Cueing  hierachy, Cognitive reorganization, Internal/external aids, Functional tasks, SLP instruction and feedback, and Compensatory strategies ?  ?  ?  ?Su Monks, CCC-SLP ?06/14/2021, 12:28 PM ?  ?

## 2021-06-14 NOTE — Patient Instructions (Signed)
Attention: ?Limit multi-tasking or dividing attention, especially when something is important  ?Sometimes this can be planning: plan to do important or demanding activities when you can be uninterrupted ?Can also think about planning complex or demanding tasks for best time of day  ?If interrupted, ask for the person to wait until you have free time and can focus you attention   ? ?Break in Routines: ?Write yourself a note- use a notepad, sticky notes  ?Pencil it in to your calendar or planner  ? ? ?Planner: ?Use to schedule in to-do items or events which are not a part of your typically routine that you don't want to forget.  ? ? ? ?Let's brainstorm different ways to get you involved-- volunteering opportunities, making golf a safe + accessible option, learning new hobby.  ? ?

## 2021-06-14 NOTE — Therapy (Signed)
?OUTPATIENT PHYSICAL THERAPY TREATMENT NOTE ? ? ?Patient Name: Cody Dickerson ?MRN: [id number] ?DOB:22-Mar-1950, 71 y.o., male ?Today's Date: 06/14/2021 ? ?PCP: Lawerance Cruel, MD ?REFERRING PROVIDER: Carles Collet Eustace Quail, DO  ? ?END OF SESSION:  ? PT End of Session - 06/14/21 1402   ? ? Visit Number 2   ? Number of Visits 9   ? Date for PT Re-Evaluation 08/06/21   due to potential delay in scheduling  ? Authorization Type Medicare   ? PT Start Time 1400   ? PT Stop Time 2542   ? PT Time Calculation (min) 42 min   ? Equipment Utilized During Treatment --   ? Activity Tolerance Patient tolerated treatment well   ? Behavior During Therapy [facility] for tasks assessed/performed   ? ?  ?  ? ?  ? ? ?Past Medical History:  ?Diagnosis Date  ? Balance problem   ? Gout   ? Hypercholesterolemia   ? Hypertension   ? Small bowel obstruction ([place])   ? ?Past Surgical History:  ?Procedure Laterality Date  ? BOWEL BLOCKAGE  05/2019  ? ?Patient Active Problem List  ? Diagnosis Date Noted  ? Parkinson's disease ([place]) 02/06/2021  ? SBO (small bowel obstruction) ([place]) 07/06/2019  ? ? ?REFERRING DIAG: G20 (ICD-10-CM) - Parkinson's disease ([place])  ? ?THERAPY DIAG:  ?Unsteadiness on feet ? ?Abnormal posture ? ?Other abnormalities of gait and mobility ? ?PERTINENT HISTORY: Lt distal ICA aneurysm, HTN, chronic dizziness and imbalance  ? ?PRECAUTIONS: Fall ? ?SUBJECTIVE: No new changes to report  ? ?PAIN:  ?Are you having pain? No ? ?OBJECTIVE:  ?  ?TODAY'S TREATMENT:  ?Ther Ex  ?Reviewed standing PWR! Moves established by certified PWR! Therapist from previous PT POC.  ? ? Pt performs PWR! Moves in Standing position x 10 reps ?  ?PWR! Up for improved posture  ? ?PWR! Rock for improved weighshifting ? ?PWR! Twist for improved trunk rotation  ? ?PWR! Step for improved step initiation  ? ?Cues provided for upright posture, maintaining gaze on hands with twist and rock for improved rotation and high velocity stepping.  ? ?Established initial HEP (see  below) w/emphasis on vestibular component of balance, single leg stability and increased power with movement. Pt able to demonstrate each move safely w/min cues for proper hand placement and safe set up at home.  ? ?  ?PATIENT EDUCATION: ?Education details: Review of previous HEP, new additions to HEP, 3 balance systems review  ?Person educated: Patient ?Education method: Explanation and handout  ?Education comprehension: verbalized understanding and needs further education  ?  ?  ?HOME EXERCISE PROGRAM: ?Standing PWR! Moves  ? ?Access Code: [email or web address] ?URL: [email or web address] ?Date: 06/14/2021 ?Prepared by: Mickie Bail Rollande Thursby ? ?Exercises ?- Standing on pillows/dog bed with eyes closed in corner   - 1 x daily - 7 x weekly ?- Heel Raises with Counter Support  - 1 x daily - 7 x weekly - 3 sets - 10 reps ?- Modified Jumping Jacks  - 1 x daily - 7 x weekly - 3 sets - 10 reps ?- [place] at [place] 1 x daily - 7 x weekly - 3 sets - 10 reps ?  ?  ?  ?GOALS: ?Goals reviewed with patient? Yes ?  ?SHORT TERM GOALS: N/A  ?  ?LONG TERM GOALS: Target date: 07/05/2021 ?  ?Pt will be independent with PD/balance specific HEP in order to build upon functional gains made in therapy.  ?Baseline:  ?Goal  status: INITIAL ?  ?2.  Pt will recover posterior and R lateral balance in push and release test in 1 robust step independently, for improved balance recovery  ?Baseline: posterior = 3 steps, lateral to R = 2-3 steps ?Goal status: INITIAL ?  ?3.  Pt will improve miniBEST score to at least a 23/28 in order to demo decr fall risk.  ?Baseline: 20/28 ?Goal status: INITIAL ?  ?4.  Pt will improve TUG and TUG cog score to less than or equal to 10% difference for improved dual task/decreased fall risk.  ?Baseline: TUG 9.53, cog TUG: 12.97 ?Goal status: INITIAL ?  ?5.  Pt will improve condition 4 of mCTSIB to at least 20 sec in order to demo improved vestibular input for balance.  ?Baseline: 12 sec ?Goal status:  INITIAL ?  ?  ?ASSESSMENT: ?  ?CLINICAL IMPRESSION: ?Emphasis of skilled PT session on reviewing and establishing HEP for improved rotation, anterior weight shifting, vestibular component of balance and single leg stability. Pt able to demonstrate all exercises well with min cues for increased amplitude of movement of R side. Noted occasional posterior LOB throughout, provided cues for anterior weight shift which pt able to follow. Continue POC.  ?  ?OBJECTIVE IMPAIRMENTS Abnormal gait, decreased activity tolerance, decreased balance, decreased coordination, decreased strength, dizziness, impaired flexibility, and postural dysfunction.  ?  ?ACTIVITY LIMITATIONS community activity.  ?  ?PERSONAL FACTORS Past/current experiences, Time since onset of injury/illness/exacerbation, and 1-2 comorbidities:  PD, dizziness  are also affecting patient's functional outcome.  ?  ?  ?REHAB POTENTIAL: Good ?  ?CLINICAL DECISION MAKING: Stable/uncomplicated ?  ?EVALUATION COMPLEXITY: Low ?  ?PLAN: ?PT FREQUENCY: 2x/week ?  ?PT DURATION: 8 weeks ?  ?PLANNED INTERVENTIONS: Therapeutic exercises, Therapeutic activity, Neuromuscular re-education, Balance training, Gait training, Patient/Family education, Stair training, and Vestibular training ?  ?PLAN FOR NEXT SESSION: How is HEP? SLS, stepping strategies - posterior and lateral. High amplitude movements, single leg stance ? ? ? ?Cruzita Lederer Maleah Rabago, PT, DPT ?06/14/2021, 2:48 PM ? ?   ?

## 2021-06-18 ENCOUNTER — Ambulatory Visit: Payer: Medicare Other

## 2021-06-18 DIAGNOSIS — R471 Dysarthria and anarthria: Secondary | ICD-10-CM | POA: Diagnosis not present

## 2021-06-18 DIAGNOSIS — R29818 Other symptoms and signs involving the nervous system: Secondary | ICD-10-CM | POA: Diagnosis not present

## 2021-06-18 DIAGNOSIS — R293 Abnormal posture: Secondary | ICD-10-CM | POA: Diagnosis not present

## 2021-06-18 DIAGNOSIS — R2681 Unsteadiness on feet: Secondary | ICD-10-CM | POA: Diagnosis not present

## 2021-06-18 DIAGNOSIS — R278 Other lack of coordination: Secondary | ICD-10-CM | POA: Diagnosis not present

## 2021-06-18 DIAGNOSIS — R41841 Cognitive communication deficit: Secondary | ICD-10-CM | POA: Diagnosis not present

## 2021-06-18 NOTE — Therapy (Signed)
?OUTPATIENT SPEECH LANGUAGE PATHOLOGY TREATMENT NOTE ? ? ?Patient Name: Cody Dickerson ?MRN: [id number] ?DOB:February 16, 1951, 71 y.o., male ?Today's Date: 06/18/2021 ? ?PCP: Lawerance Cruel, MD ?REFERRING PROVIDER: Ludwig Clarks, DO  ? ?END OF SESSION:  ? End of Session - 06/18/21 1356   ? ? Visit Number 4   ? Number of Visits 13   ? Date for SLP Re-Evaluation 07/02/21   ? Authorization Type Medicare   ? Progress Note Due on Visit 10   ? SLP Start Time 1400   ? SLP Stop Time  1445   ? SLP Time Calculation (min) 45 min   ? Activity Tolerance Patient tolerated treatment well   ? ?  ?  ? ?  ? ? ? ?Past Medical History:  ?Diagnosis Date  ? Balance problem   ? Gout   ? Hypercholesterolemia   ? Hypertension   ? Small bowel obstruction ([place])   ? ?Past Surgical History:  ?Procedure Laterality Date  ? BOWEL BLOCKAGE  05/2019  ? ?Patient Active Problem List  ? Diagnosis Date Noted  ? Parkinson's disease ([place]) 02/06/2021  ? SBO (small bowel obstruction) ([place]) 07/06/2019  ? ? ?ONSET DATE: dx with Parkinson's December 2021; referral date 05/30/2021 ? ?REFERRING DIAG: G20 (ICD-10-CM) - Parkinson's disease ([place])  ? ?THERAPY DIAG:  ?Cognitive communication deficit ? ?Dysarthria and anarthria ? ?SUBJECTIVE: "Been going alright"  ? ?PAIN:  ?Are you having pain? No ? ?OBJECTIVE:  ? ?TODAY'S TREATMENT: ?Skilled Treatment: ?06/18/21: Pt entered with ~WNL volume (averaged low 70s dB with range of upper 60s to low 70s dB). Intermittent hoarseness exhibited today, which pt reports occurs occasionally if talking extended periods of time. Some difficulty projecting at home, in which SLP assisted with reframing targeted volume as "one notch up on the volume" or "8/10 effort level." Completed Speak Out! Lesson #4 today. Intermittent min A required to optimize breath support due to hoarseness. Subsequent conversation averaged ~69 dB. SLP briefly reviewed targeted cognitive strategies and PD support groups to optimize patient social communication and  engagement.  ? ?06/14/21: Pt reports doing lesson 2 of Speak Out previous day. Pt reports difficulty knowing if he's meeting sound levels. Briefly reviewed exercises with usual max-A required to meet target dB in effort to calibrate pt's perception of effort and loudness required during home practice. Target attention and memory compensations through education this date. Pt reports difficulty with attention, multitasking, and processing complex or novel information. ST provided education on compensations and strategies pt can implement based on pt's daily activities and challenges. Pt endorses understanding.  ? ?06/12/21: Introduced pt to the Speak Out! methodology. Reviewed all exercises and demonstrated for pt. Target improving vocal quality and increasing intensity through progressively difficult speech tasks using Speak Out! program, lesson 1. ST leads pt through exercises providing usual model prior to pt execution. Rare ? min-A required to achieve target dB this date. Averages this date: loud "ah" 91 dB; reading 77 dB; cognitive speech task 74 dB. Conversational sample of approx 5 minutes, pt averages 65 dB with usual min-A to increase intensity.  ?  ?  ?PATIENT EDUCATION: ?Education details: see above ?Person educated: Patient ?Education method: Explanation, Demonstration, and Handouts ?Education comprehension: verbalized understanding, returned demonstration, and needs further education ?  ?  ?HOME EXERCISE PROGRAM: ?Order Speak Out! workbook ?  ?  ?GOALS: ?Goals reviewed with patient? Yes ?  ?SHORT TERM GOALS: Target date: 07/02/2021 ?  ?Pt will verbalize and implement 2 attention and memory  compensations to aid daily functioning given occasional min A over 2 sessions ?Baseline: n/a ?Goal status: ONGOING ?  ?2.  Pt will maintain WNL conversational volume (70-72 dB) in 10+ minute simple conversation given occasional min A over 2 sessions ?Baseline: 06-18-21 ?Goal status: ONGOING ?  ?3.  Pt will demonstrate  ability to alternate attention successfully between conversation/structured tasks, using compensations, given occasional min A ?Baseline: n/a ?Goal status: ONGOING ?  ?  ?LONG TERM GOALS: Target date: 07/16/2021 ?  ?Pt will report improved cognitive linguistic functioning via PROM by 5 points at last ST session ?Baseline: 96 ?Goal status: ONGOING ?  ?2.  Pt will maintain WNL conversational volume (70-72 dB) in 20+ minute mod complex conversation given occasional min A over 2 sessions ?Baseline: n/a ?Goal status: ONGOING ?  ?3.  Pt will report successful implementation, with benefit, of x4 attention and memory compensation/strategies to aid in daily functioning over 1 week period ?Baseline: n/a ?Goal status: ONGOING ?  ?  ?ASSESSMENT: ?  ?CLINICAL IMPRESSION: ?Patient is a 71 y.o. 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 Pt reports significant difficulty with multitasking and tells ST if he does not "fully focus" on something then "poof, it's gone and I can't remember." These reports are supported by results from neuro-psych testing which ID'd attention, processing, and memory deficits. Pt also reports difficulties with mental flexibility or problem solving. Does well with routine but problems arise if unable to stick to routine (e.g. forgetting meds at normal time). Occasional difficulty with word finding, no overt word finding difficulties evidenced during today's evaluation. Pt demonstrates reduced vocal intensity throughout structured and unstructured speech tasks (avg 68 dB conversational speech). Would benefit from education on the mechanisms impacting speech production in Parkinson's pts so pt can avoid significant decline in communication abilities with progressive disease. ST will also target use of cognitive compensations/strategies to accommodate MCI and increase pt's overall functioning at work and home.  ?   ?OBJECTIVE IMPAIRMENTS  ?Objective impairments include attention, memory, executive functioning, and dysarthria. These impairments are limiting patient from managing medications, managing appointments, managing finances, household responsibilities, and effectively communicating at home and in community.Factors affecting potential to achieve goals and functional outcome are medical prognosis.. Patient will benefit from skilled SLP services to address above impairments and improve overall function. ?  ?REHAB POTENTIAL: Good ?  ?PLAN: ?SLP FREQUENCY: 2x/week ?  ?SLP DURATION: 6 weeks ?  ?PLANNED INTERVENTIONS: Environmental controls, Cueing hierachy, Cognitive reorganization, Internal/external aids, Functional tasks, SLP instruction and feedback, and Compensatory strategies ?  ?  ?ISON WICHMANN, CCC-SLP ?06/18/2021, 3:31 PM ?  ?

## 2021-06-21 ENCOUNTER — Ambulatory Visit: Payer: Medicare Other | Admitting: Speech Pathology

## 2021-06-21 DIAGNOSIS — R41841 Cognitive communication deficit: Secondary | ICD-10-CM

## 2021-06-21 DIAGNOSIS — R471 Dysarthria and anarthria: Secondary | ICD-10-CM | POA: Diagnosis not present

## 2021-06-21 DIAGNOSIS — R2681 Unsteadiness on feet: Secondary | ICD-10-CM | POA: Diagnosis not present

## 2021-06-21 DIAGNOSIS — R278 Other lack of coordination: Secondary | ICD-10-CM | POA: Diagnosis not present

## 2021-06-21 DIAGNOSIS — R29818 Other symptoms and signs involving the nervous system: Secondary | ICD-10-CM | POA: Diagnosis not present

## 2021-06-21 DIAGNOSIS — R293 Abnormal posture: Secondary | ICD-10-CM | POA: Diagnosis not present

## 2021-06-21 NOTE — Therapy (Signed)
?OUTPATIENT SPEECH LANGUAGE PATHOLOGY TREATMENT NOTE ? ? ?Patient Name: Cody Dickerson ?MRN: [id number] ?DOB:1950/10/16, 71 y.o., male ?Today's Date: 06/21/2021 ? ?PCP: Lawerance Cruel, MD ?REFERRING PROVIDER: Ludwig Clarks, DO  ? ?END OF SESSION:  ? End of Session - 06/21/21 1403   ? ? Visit Number 5   ? Number of Visits 13   ? Date for SLP Re-Evaluation 07/02/21   ? Authorization Type Medicare   ? Progress Note Due on Visit 10   ? SLP Start Time 1403   ? SLP Stop Time  1445   ? SLP Time Calculation (min) 42 min   ? Activity Tolerance Patient tolerated treatment well   ? ?  ?  ? ?  ? ? ? ?Past Medical History:  ?Diagnosis Date  ? Balance problem   ? Gout   ? Hypercholesterolemia   ? Hypertension   ? Small bowel obstruction ([place])   ? ?Past Surgical History:  ?Procedure Laterality Date  ? BOWEL BLOCKAGE  05/2019  ? ?Patient Active Problem List  ? Diagnosis Date Noted  ? Parkinson's disease ([place]) 02/06/2021  ? SBO (small bowel obstruction) ([place]) 07/06/2019  ? ? ?ONSET DATE: dx with Parkinson's December 2021; referral date 05/30/2021 ? ?REFERRING DIAG: G20 (ICD-10-CM) - Parkinson's disease ([place])  ? ?THERAPY DIAG:  ?Cognitive communication deficit ? ?Dysarthria and anarthria ? ?SUBJECTIVE: "I went into the office for a bit this morning"  ? ?PAIN:  ?Are you having pain? No ? ?OBJECTIVE:  ? ?TODAY'S TREATMENT: ?Skilled Treatment: ?06/21/21: Pt reports to doing Speak Out! Lessons 5 and 6. Pt with usual success in meeting dB targets for counting, reading components (80+ dB) with rare min-A. Averages 73 dB during cognitive task IND. Notable difference when using intentional speech in structured tasks vs. Spontaneous speech. In spontaneous speech, volume is overall is adequate, nearing WNL, not affecting intelligibility (68-72 dB). Asked pt to teach back previously addressed attention/memory compensations. Rare min-A for recall.  ? ?06/18/21: Pt entered with ~WNL volume (averaged low 70s dB with range of upper 60s to low 70s  dB). Intermittent hoarseness exhibited today, which pt reports occurs occasionally if talking extended periods of time. Some difficulty projecting at home, in which SLP assisted with reframing targeted volume as "one notch up on the volume" or "8/10 effort level." Completed Speak Out! Lesson #4 today. Intermittent min A required to optimize breath support due to hoarseness. Subsequent conversation averaged ~69 dB. SLP briefly reviewed targeted cognitive strategies and PD support groups to optimize patient social communication and engagement.  ? ?06/14/21: Pt reports doing lesson 2 of Speak Out previous day. Pt reports difficulty knowing if he's meeting sound levels. Briefly reviewed exercises with usual max-A required to meet target dB in effort to calibrate pt's perception of effort and loudness required during home practice. Target attention and memory compensations through education this date. Pt reports difficulty with attention, multitasking, and processing complex or novel information. ST provided education on compensations and strategies pt can implement based on pt's daily activities and challenges. Pt endorses understanding.  ? ?06/12/21: Introduced pt to the Speak Out! methodology. Reviewed all exercises and demonstrated for pt. Target improving vocal quality and increasing intensity through progressively difficult speech tasks using Speak Out! program, lesson 1. ST leads pt through exercises providing usual model prior to pt execution. Rare ? min-A required to achieve target dB this date. Averages this date: loud "ah" 91 dB; reading 77 dB; cognitive speech task 74 dB. Conversational sample  of approx 5 minutes, pt averages 65 dB with usual min-A to increase intensity.  ?  ?  ?PATIENT EDUCATION: ?Education details: see above ?Person educated: Patient ?Education method: Explanation, Demonstration, and Handouts ?Education comprehension: verbalized understanding, returned demonstration, and needs further  education ?  ?  ?HOME EXERCISE PROGRAM: ?Order Speak Out! workbook ?  ?  ?GOALS: ?Goals reviewed with patient? Yes ?  ?SHORT TERM GOALS: Target date: 07/02/2021 ?  ?Pt will verbalize and implement 2 attention and memory compensations to aid daily functioning given occasional min A over 2 sessions ?Baseline: n/a ?Goal status: ONGOING ?  ?2.  Pt will maintain WNL conversational volume (70-72 dB) in 10+ minute simple conversation given occasional min A over 2 sessions ?Baseline: 06-18-21 ?Goal status: ONGOING ?  ?3.  Pt will demonstrate ability to alternate attention successfully between conversation/structured tasks, using compensations, given occasional min A ?Baseline: n/a ?Goal status: ONGOING ?  ?  ?LONG TERM GOALS: Target date: 07/16/2021 ?  ?Pt will report improved cognitive linguistic functioning via PROM by 5 points at last ST session ?Baseline: 96 ?Goal status: ONGOING ?  ?2.  Pt will maintain WNL conversational volume (70-72 dB) in 20+ minute mod complex conversation given occasional min A over 2 sessions ?Baseline: n/a ?Goal status: ONGOING ?  ?3.  Pt will report successful implementation, with benefit, of x4 attention and memory compensation/strategies to aid in daily functioning over 1 week period ?Baseline: n/a ?Goal status: ONGOING ?  ?  ?ASSESSMENT: ?  ?CLINICAL IMPRESSION: ?Patient is a 71 y.o. 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 Pt reports significant difficulty with multitasking and tells ST if he does not "fully focus" on something then "poof, it's gone and I can't remember." These reports are supported by results from neuro-psych testing which ID'd attention, processing, and memory deficits. Pt also reports difficulties with mental flexibility or problem solving. Does well with routine but problems arise if unable to stick to routine (e.g. forgetting meds at normal time). Occasional difficulty with  word finding, no overt word finding difficulties evidenced during today's evaluation. Pt demonstrates reduced vocal intensity throughout structured and unstructured speech tasks (avg 68 dB conversational speech). Would benefit from education on the mechanisms impacting speech production in Parkinson's pts so pt can avoid significant decline in communication abilities with progressive disease. ST will also target use of cognitive compensations/strategies to accommodate MCI and increase pt's overall functioning at work and home.  ?  ?OBJECTIVE IMPAIRMENTS  ?Objective impairments include attention, memory, executive functioning, and dysarthria. These impairments are limiting patient from managing medications, managing appointments, managing finances, household responsibilities, and effectively communicating at home and in community.Factors affecting potential to achieve goals and functional outcome are medical prognosis.. Patient will benefit from skilled SLP services to address above impairments and improve overall function. ?  ?REHAB POTENTIAL: Good ?  ?PLAN: ?SLP FREQUENCY: 2x/week ?  ?SLP DURATION: 6 weeks ?  ?PLANNED INTERVENTIONS: Environmental controls, Cueing hierachy, Cognitive reorganization, Internal/external aids, Functional tasks, SLP instruction and feedback, and Compensatory strategies ?  ?  ?Kiyonna Tortorelli L. Marcello Moores, CCC-SLP ?06/21/2021, 2:04 PM ?  ?

## 2021-06-25 ENCOUNTER — Encounter: Payer: Self-pay | Admitting: Occupational Therapy

## 2021-06-25 ENCOUNTER — Ambulatory Visit: Payer: Medicare Other | Admitting: Occupational Therapy

## 2021-06-25 ENCOUNTER — Ambulatory Visit: Payer: Medicare Other

## 2021-06-25 ENCOUNTER — Ambulatory Visit: Payer: Medicare Other | Admitting: Physical Therapy

## 2021-06-25 DIAGNOSIS — R251 Tremor, unspecified: Secondary | ICD-10-CM

## 2021-06-25 DIAGNOSIS — R2681 Unsteadiness on feet: Secondary | ICD-10-CM

## 2021-06-25 DIAGNOSIS — R41841 Cognitive communication deficit: Secondary | ICD-10-CM | POA: Diagnosis not present

## 2021-06-25 DIAGNOSIS — R471 Dysarthria and anarthria: Secondary | ICD-10-CM | POA: Diagnosis not present

## 2021-06-25 DIAGNOSIS — R29818 Other symptoms and signs involving the nervous system: Secondary | ICD-10-CM | POA: Diagnosis not present

## 2021-06-25 DIAGNOSIS — R41840 Attention and concentration deficit: Secondary | ICD-10-CM

## 2021-06-25 DIAGNOSIS — R293 Abnormal posture: Secondary | ICD-10-CM | POA: Diagnosis not present

## 2021-06-25 DIAGNOSIS — R2689 Other abnormalities of gait and mobility: Secondary | ICD-10-CM

## 2021-06-25 DIAGNOSIS — M6281 Muscle weakness (generalized): Secondary | ICD-10-CM

## 2021-06-25 DIAGNOSIS — R29898 Other symptoms and signs involving the musculoskeletal system: Secondary | ICD-10-CM

## 2021-06-25 DIAGNOSIS — R278 Other lack of coordination: Secondary | ICD-10-CM | POA: Diagnosis not present

## 2021-06-25 NOTE — Therapy (Signed)
?OUTPATIENT SPEECH LANGUAGE PATHOLOGY TREATMENT NOTE ? ? ?Patient Name: Cody Dickerson ?MRN: [id number] ?DOB:Apr 17, 1950, 71 y.o., male ?Today's Date: 06/25/2021 ? ?PCP: Lawerance Cruel, MD ?REFERRING PROVIDER: Ludwig Clarks, DO  ? ?END OF SESSION:  ? End of Session - 06/25/21 1308   ? ? Visit Number 6   ? Number of Visits 13   ? Date for SLP Re-Evaluation 07/02/21   ? Authorization Type Medicare   ? Progress Note Due on Visit 10   ? SLP Start Time 1405   ? SLP Stop Time  1445   ? SLP Time Calculation (min) 40 min   ? Activity Tolerance Patient tolerated treatment well   ? ?  ?  ? ?  ? ? ? ?Past Medical History:  ?Diagnosis Date  ? Balance problem   ? Gout   ? Hypercholesterolemia   ? Hypertension   ? Small bowel obstruction ([place])   ? ?Past Surgical History:  ?Procedure Laterality Date  ? BOWEL BLOCKAGE  05/2019  ? ?Patient Active Problem List  ? Diagnosis Date Noted  ? Parkinson's disease ([place]) 02/06/2021  ? SBO (small bowel obstruction) ([place]) 07/06/2019  ? ? ?ONSET DATE: dx with Parkinson's December 2021; referral date 05/30/2021 ? ?REFERRING DIAG: G20 (ICD-10-CM) - Parkinson's disease ([place])  ? ?THERAPY DIAG:  ?Dysarthria and anarthria ? ?Cognitive communication deficit ? ?SUBJECTIVE: "I just practiced once over the weekend"  ? ?PAIN:  ?Are you having pain? No ? ?OBJECTIVE:  ? ?TODAY'S TREATMENT: ?Skilled Treatment: ?06/25/21: Speak Out! completed x1 over weekend. Completed Lesson #9 with intermittent mod A to optimize effort level for initial warm up exercises. Averages 74 and 72 dB during reading and cognitive tasks respectively given rare min A.  Conversational volume over 10 minutes averaged 70 dB with rare min A. SLP encouraged completion of HEP 1x/day for optimal carryover.  ? ?06/21/21: Pt reports to doing Speak Out! Lessons 5 and 6. Pt with usual success in meeting dB targets for counting, reading components (80+ dB) with rare min-A. Averages 73 dB during cognitive task IND. Notable difference when using  intentional speech in structured tasks vs. Spontaneous speech. In spontaneous speech, volume is overall is adequate, nearing WNL, not affecting intelligibility (68-72 dB). Asked pt to teach back previously addressed attention/memory compensations. Rare min-A for recall.  ? ?06/18/21: Pt entered with ~WNL volume (averaged low 70s dB with range of upper 60s to low 70s dB). Intermittent hoarseness exhibited today, which pt reports occurs occasionally if talking extended periods of time. Some difficulty projecting at home, in which SLP assisted with reframing targeted volume as "one notch up on the volume" or "8/10 effort level." Completed Speak Out! Lesson #4 today. Intermittent min A required to optimize breath support due to hoarseness. Subsequent conversation averaged ~69 dB. SLP briefly reviewed targeted cognitive strategies and PD support groups to optimize patient social communication and engagement.  ? ?06/14/21: Pt reports doing lesson 2 of Speak Out previous day. Pt reports difficulty knowing if he's meeting sound levels. Briefly reviewed exercises with usual max-A required to meet target dB in effort to calibrate pt's perception of effort and loudness required during home practice. Target attention and memory compensations through education this date. Pt reports difficulty with attention, multitasking, and processing complex or novel information. ST provided education on compensations and strategies pt can implement based on pt's daily activities and challenges. Pt endorses understanding.  ? ?06/12/21: Introduced pt to the Speak Out! methodology. Reviewed all exercises and demonstrated  for pt. Target improving vocal quality and increasing intensity through progressively difficult speech tasks using Speak Out! program, lesson 1. ST leads pt through exercises providing usual model prior to pt execution. Rare ? min-A required to achieve target dB this date. Averages this date: loud "ah" 91 dB; reading 77 dB;  cognitive speech task 74 dB. Conversational sample of approx 5 minutes, pt averages 65 dB with usual min-A to increase intensity.  ?  ?  ?PATIENT EDUCATION: ?Education details: see above ?Person educated: Patient ?Education method: Explanation, Demonstration, and Handouts ?Education comprehension: verbalized understanding, returned demonstration, and needs further education ?  ?  ?HOME EXERCISE PROGRAM: ?Order Speak Out! workbook ?  ?  ?GOALS: ?Goals reviewed with patient? Yes ?  ?SHORT TERM GOALS: Target date: 07/02/2021 ?  ?Pt will verbalize and implement 2 attention and memory compensations to aid daily functioning given occasional min A over 2 sessions ?Baseline: n/a ?Goal status: ONGOING ?  ?2.  Pt will maintain WNL conversational volume (70-72 dB) in 10+ minute simple conversation given occasional min A over 2 sessions ?Baseline: 06-18-21, 06-25-21 ?Goal status: MET ?  ?3.  Pt will demonstrate ability to alternate attention successfully between conversation/structured tasks, using compensations, given occasional min A ?Baseline: n/a ?Goal status: ONGOING ?  ?  ?LONG TERM GOALS: Target date: 07/16/2021 ?  ?Pt will report improved cognitive linguistic functioning via PROM by 5 points at last ST session ?Baseline: 96 ?Goal status: ONGOING ?  ?2.  Pt will maintain WNL conversational volume (70-72 dB) in 20+ minute mod complex conversation given occasional min A over 2 sessions ?Baseline: n/a ?Goal status: ONGOING ?  ?3.  Pt will report successful implementation, with benefit, of x4 attention and memory compensation/strategies to aid in daily functioning over 1 week period ?Baseline: n/a ?Goal status: ONGOING ?  ?  ?ASSESSMENT: ?  ?CLINICAL IMPRESSION: ?Patient is a 71 y.o. 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 Pt reports significant difficulty with multitasking and tells ST if he does not "fully focus" on something then  "poof, it's gone and I can't remember." These reports are supported by results from neuro-psych testing which ID'd attention, processing, and memory deficits. Pt also reports difficulties with mental flexibility or problem solving. Does well with routine but problems arise if unable to stick to routine (e.g. forgetting meds at normal time). Occasional difficulty with word finding, no overt word finding difficulties evidenced during today's evaluation. Pt demonstrates improving vocal intensity throughout structured and unstructured speech tasks (avg 68 dB conversational speech). ST will also target use of cognitive compensations/strategies to accommodate MCI and increase pt's overall functioning at work and home.  ?  ?OBJECTIVE IMPAIRMENTS  ?Objective impairments include attention, memory, executive functioning, and dysarthria. These impairments are limiting patient from managing medications, managing appointments, managing finances, household responsibilities, and effectively communicating at home and in community.Factors affecting potential to achieve goals and functional outcome are medical prognosis.. Patient will benefit from skilled SLP services to address above impairments and improve overall function. ?  ?REHAB POTENTIAL: Good ?  ?PLAN: ?SLP FREQUENCY: 2x/week ?  ?SLP DURATION: 6 weeks ?  ?PLANNED INTERVENTIONS: Environmental controls, Cueing hierachy, Cognitive reorganization, Internal/external aids, Functional tasks, SLP instruction and feedback, and Compensatory strategies ?  ?  ?DEVONTAY CELAYA, CCC-SLP ?06/25/2021, 2:08 PM ?  ?

## 2021-06-25 NOTE — Therapy (Signed)
? ? ?OUTPATIENT OCCUPATIONAL THERAPY TREATMENT NOTE ? ? ?Patient Name: Cody Dickerson ?MRN: [id number] ?DOB:[birth year], 71 y.o., male ?Today's Date: 06/25/2021 ? ?PCP: Lawerance Cruel, MD ?REFERRING PROVIDER: Dr. Wells Guiles Tat  ? ?END OF SESSION:  ? OT End of Session - 06/25/21 1235   ? ? Visit Number 2   ? Number of Visits 17   ? Date for OT Re-Evaluation 08/10/21   ? Authorization Type Medicare & [employer], covered at 100%   ? Authorization - Visit Number 2   ? Authorization - Number of Visits 10   ? Progress Note Due on Visit 10   ? OT Start Time 1235   ? OT Stop Time 1315   ? OT Time Calculation (min) 40 min   ? Activity Tolerance Patient tolerated treatment well   ? Behavior During Therapy [facility] for tasks assessed/performed   ? ?  ?  ? ?  ? ? ?Past Medical History:  ?Diagnosis Date  ? Balance problem   ? Gout   ? Hypercholesterolemia   ? Hypertension   ? Small bowel obstruction ([place])   ? ?Past Surgical History:  ?Procedure Laterality Date  ? BOWEL BLOCKAGE  05/2019  ? ?Patient Active Problem List  ? Diagnosis Date Noted  ? Parkinson's disease ([place]) 02/06/2021  ? SBO (small bowel obstruction) ([place]) 07/06/2019  ? ? ?ONSET DATE: 05/30/21 (screen date)  ? ? ? ? ?THERAPY DIAG:  ?Other lack of coordination ? ?Other symptoms and signs involving the nervous system ? ?Abnormal posture ? ?Unsteadiness on feet ? ?Tremor ? ?Attention and concentration deficit ? ?Other symptoms and signs involving the musculoskeletal system ? ? ?PERTINENT HISTORY: Parkinson's Disease; Hypercholesterolemia, HTN, hx of gout, hx small bowel blockage, hx of falls, hx of dizziness ? ?PRECAUTIONS: fall ? ?SUBJECTIVE: "I picked up something wrong" (RUE) ? ?PAIN:  ?Are you having pain? Yes: NPRS scale: 2/10 ?Pain location: R shoulder ?Pain description: mild soreness ?Aggravating factors: certain movements  ?Relieving factors: none during session ? ? ? ? ?OBJECTIVE:  ? ?TODAY'S TREATMENT: ? ?PWR! Moves (basic 4) in supine x 10-20 each with min cues For incr  movement amplitude (step modified:  stepping x10 then bridges x10, no scoot) ? ?PWR! Moves (basic 4) in sitting x 10-20 each with min cues For incr movement amplitude. ? ?PWR! Hands (basic 4) x 10 each with min cues For incr movement amplitude. ? ?Began reviewing coordination HEP:  flipping cards, dealing cards with thumb, picking up and stacking coins--all with BUEs and with min cueing for large amplitude movements. ? ? ?PATIENT EDUCATION: ?Education details: review PWR! hands ?Person educated: Patient ?Education method: Explanation, Demo ?Education comprehension: verbalized understanding, return demo ? ? ?HOME EXERCISE PROGRAM: ?Not yet issued updated, reviewed PWR! Hands 06/25/21--pt to bring handouts to next session for updates prn ? ?ASSESSMENT: ? ?CLINICAL IMPRESSION: ?Pt needed min cueing for PWR! Moves in sitting, supine, and PWR! Hands today.  Pt needs min cueing for incr movement amplitude techniques.  ? ?PERFORMANCE DEFICITS in functional skills including ADLs, IADLs, coordination, dexterity, tone, ROM, FMC, GMC, mobility, balance, decreased knowledge of use of DME, and UE functional use, cognitive skills including attention, memory, and problem solving, and psychosocial skills including n/a.  ? ?IMPAIRMENTS are limiting patient from ADLs, IADLs, and leisure.  ? ?COMORBIDITIES may have co-morbidities  that affects occupational performance. Patient will benefit from skilled OT to address above impairments and improve overall function. ? ?MODIFICATION OR ASSISTANCE TO COMPLETE EVALUATION: No modification of  tasks or assist necessary to complete an evaluation. ? ?OT OCCUPATIONAL PROFILE AND HISTORY: Detailed assessment: Review of records and additional review of physical, cognitive, psychosocial history related to current functional performance. ? ?CLINICAL DECISION MAKING: Moderate - several treatment options, min-mod task modification necessary ? ?REHAB POTENTIAL: Good ? ?EVALUATION COMPLEXITY:  Low ? ? ? ? ?GOALS: ?Potential Goals/POC reviewed with patient? Yes ? ?SHORT TERM GOALS: Target date: 07/09/2021 ? ?Pt will be independent with updated PD-specific HEP. ? ?Goal status: INITIAL ? ?2.  Pt will verbalize understanding of adaptive strategies to incr ease/safety with ADLs/IADLs (including writing, shaving, buttoning, tying shoes, washing hair, eating, putting on jacket) ? ?Goal status: INITIAL ? ? ?LONG TERM GOALS: Target date: 08/06/2021 ? ?Pt will improve LUE functional reach and balance for ADLs/IADLs as shown by improving standing functional reach to at least 10" with LUE. ?Baseline:  8.5" ?Goal status: INITIAL ? ?2.  Pt will improve functional reach/coordination for ADLs as shown by improving score on box and blocks test by at least 3 with RUE. ?Baseline:  38 blocks ?Goal status: INITIAL ? ?3.  Pt will improve functional reach/coordination for ADLs as shown by improving score on box and blocks test by at least 3 with LUE. ?Baseline:  41 blocks ?Goal status: INITIAL ? ?4.  Pt will be able to reach overhead with at least -20* R elbow extension. ?Baseline: -30* ?Goal status: INITIAL ? ?5.  Pt will be able to reach overhead with at least -20* L elbow extension. ?Baseline:   -30 ?Goal status: INITIAL ? ? ? ?PLAN: ?OT FREQUENCY: 2x/week  ? ?OT DURATION: 6 weeks  (or 13 visits over 8 weeks due to scheduling) ? ?PLANNED INTERVENTIONS: self care/ADL training, therapeutic exercise, therapeutic activity, neuromuscular re-education, manual therapy, passive range of motion, balance training, functional mobility training, splinting, moist heat, cryotherapy, patient/family education, cognitive remediation/compensation, energy conservation, and DME and/or AE instructions ? ?RECOMMENDED OTHER SERVICES: current with PT, ST ? ?CONSULTED AND AGREED WITH PLAN OF CARE: Patient ? ?PLAN FOR NEXT SESSION:  update HEP (pt to bring in handouts--?issue seated/supine PWR! Moves, may benefit from PD exercise  flowsheet) ? ? ? ? Vianne Bulls, [place] ?06/25/2021, 12:37 PM ? ? Vianne Bulls, OTR/L ?[address] ?[place], [place]  [address] ?[phone] phone ?[phone] ?06/25/21 1:14 PM ? ? ? ? ? ?

## 2021-06-25 NOTE — Therapy (Signed)
?OUTPATIENT PHYSICAL THERAPY TREATMENT NOTE ? ? ?Patient Name: Cody Dickerson ?MRN: [id number] ?DOB:1950/12/31, 71 y.o., male ?Today's Date: 06/25/2021 ? ?PCP: Lawerance Cruel, MD ?REFERRING PROVIDER: Carles Collet Eustace Quail, DO  ? ?END OF SESSION:  ? PT End of Session - 06/25/21 1447   ? ? Visit Number 3   ? Number of Visits 9   ? Date for PT Re-Evaluation 08/06/21   due to potential delay in scheduling  ? Authorization Type Medicare   ? PT Start Time 6629   ? PT Stop Time 1534   ? PT Time Calculation (min) 49 min   ? Activity Tolerance Patient tolerated treatment well   ? Behavior During Therapy [facility] for tasks assessed/performed   ? ?  ?  ? ?  ? ? ? ?Past Medical History:  ?Diagnosis Date  ? Balance problem   ? Gout   ? Hypercholesterolemia   ? Hypertension   ? Small bowel obstruction ([place])   ? ?Past Surgical History:  ?Procedure Laterality Date  ? BOWEL BLOCKAGE  05/2019  ? ?Patient Active Problem List  ? Diagnosis Date Noted  ? Parkinson's disease ([place]) 02/06/2021  ? SBO (small bowel obstruction) ([place]) 07/06/2019  ? ? ?REFERRING DIAG: G20 (ICD-10-CM) - Parkinson's disease ([place])  ? ?THERAPY DIAG:  ?Other abnormalities of gait and mobility ? ?Unsteadiness on feet ? ?Muscle weakness (generalized) ? ?PERTINENT HISTORY: Lt distal ICA aneurysm, HTN, chronic dizziness and imbalance  ? ?PRECAUTIONS: Fall ? ?SUBJECTIVE: No new changes other than "my balance is lousy" ? ?PAIN:  ?Are you having pain? Yes: NPRS scale: 2/10 ?Pain location: R shoulder ? ?OBJECTIVE:  ?  ?TODAY'S TREATMENT:  ?There Ex ?SciFit sprint hills level 3 for 7 minutes using BLE/BUEs for neural priming for reciprocal movement, dynamic cardiovascular warmup and BLE strength.  ? ?NMR ?In // bars for improved single leg stability, ankle strategy and vestibular function: ?-Standing on rockerboard x8 minutes w/intermittent UE support and EO, stood in front of mirror for visual biofeedback on body position. Noted significant posterior bias, as pt had to stand on  anterior edge of board in order to stabilize and was still leaning posteriorly. Min A for tactile cues to shift pt forward and provided mod cues to push weight through forefoot using hips rather than bending forward. Pt unable to maintain weight anteriorly without physical assistance.  ?-Progressed to standing on board w/EC and unilateral UE support, pt required min-mod A to prevent LOB in posterior direction, which pt seemingly unaware of. Pt unable to shift weight anteriorly w/EC despite tactile and verbal cues.  ? ?Sit <>stand w/unilateral 5# OH KB hold for posterior bias to facilitate anterior weight shifting and core stabilization. Pt performed 6 reps per side, noted posterior lean against mat and toes coming off of floor throughout movement, but no posterior LOB. Pt unaware that he is bringing his toes off floor during transfers and static standing. Lengthy discussion regarding shifting weight anteriorly over forefoot w/weightbearing to reduce retropulsion and facilitate midline orientation for balance. Pt to practice at home during sit <>stands.  ? ? ?  ?PATIENT EDUCATION: ?Education details: Continuing HEP, becoming aware of posterior lean in stance and working on shifting weight to forefeet rather than leaning on heels ?Person educated: Patient ?Education method: Explanation  ?Education comprehension: verbalized understanding and needs further education  ?  ?  ?HOME EXERCISE PROGRAM: ?Standing PWR! Moves  ? ?Access Code: [email or web address] ?URL: [email or web address] ?Date: 06/14/2021 ?Prepared by: Mickie Bail Nora Sabey ? ?Exercises ?- Standing  on pillows/dog bed with eyes closed in corner   - 1 x daily - 7 x weekly ?- Heel Raises with Counter Support  - 1 x daily - 7 x weekly - 3 sets - 10 reps ?- Modified Jumping Jacks  - 1 x daily - 7 x weekly - 3 sets - 10 reps ?- [place] 1 x daily - 7 x weekly - 3 sets - 10 reps ?  ?  ?  ?GOALS: ?Goals reviewed with patient? Yes ?  ?SHORT  TERM GOALS: N/A  ?  ?LONG TERM GOALS: Target date: 07/05/2021 ?  ?Pt will be independent with PD/balance specific HEP in order to build upon functional gains made in therapy.  ?Baseline:  ?Goal status: INITIAL ?  ?2.  Pt will recover posterior and R lateral balance in push and release test in 1 robust step independently, for improved balance recovery  ?Baseline: posterior = 3 steps, lateral to R = 2-3 steps ?Goal status: INITIAL ?  ?3.  Pt will improve miniBEST score to at least a 23/28 in order to demo decr fall risk.  ?Baseline: 20/28 ?Goal status: INITIAL ?  ?4.  Pt will improve TUG and TUG cog score to less than or equal to 10% difference for improved dual task/decreased fall risk.  ?Baseline: TUG 9.53, cog TUG: 12.97 ?Goal status: INITIAL ?  ?5.  Pt will improve condition 4 of mCTSIB to at least 20 sec in order to demo improved vestibular input for balance.  ?Baseline: 12 sec ?Goal status: INITIAL ?  ?  ?ASSESSMENT: ?  ?CLINICAL IMPRESSION: ?Emphasis of skilled PT session on dynamic standing balance, ankle strategy and anterior weight shifting. Noted that pt stands w/weight through heels only, facilitating posterior lean. Pt seemingly unaware of his toes coming off ground during static standing. Educated pt on shifting his weight through his forefoot and practicing at home for improved anterior shifting. Continue POC.  ?  ?OBJECTIVE IMPAIRMENTS Abnormal gait, decreased activity tolerance, decreased balance, decreased coordination, decreased strength, dizziness, impaired flexibility, and postural dysfunction.  ?  ?ACTIVITY LIMITATIONS community activity.  ?  ?PERSONAL FACTORS Past/current experiences, Time since onset of injury/illness/exacerbation, and 1-2 comorbidities:  PD, dizziness  are also affecting patient's functional outcome.  ?  ?  ?REHAB POTENTIAL: Good ?  ?CLINICAL DECISION MAKING: Stable/uncomplicated ?  ?EVALUATION COMPLEXITY: Low ?  ?PLAN: ?PT FREQUENCY: 2x/week ?  ?PT DURATION: 8 weeks ?  ?PLANNED  INTERVENTIONS: Therapeutic exercises, Therapeutic activity, Neuromuscular re-education, Balance training, Gait training, Patient/Family education, Stair training, and Vestibular training ?  ?PLAN FOR NEXT SESSION: How is HEP? SLS, stepping strategies - posterior and lateral. High amplitude movements, single leg stance, ANTERIOR WEIGHT SHIFTING ? ? ? ?Cruzita Lederer Prezley Qadir, PT, DPT ?06/25/2021, 4:42 PM ? ?   ?

## 2021-06-28 ENCOUNTER — Ambulatory Visit: Payer: Medicare Other | Admitting: Speech Pathology

## 2021-06-28 DIAGNOSIS — R278 Other lack of coordination: Secondary | ICD-10-CM | POA: Diagnosis not present

## 2021-06-28 DIAGNOSIS — R471 Dysarthria and anarthria: Secondary | ICD-10-CM

## 2021-06-28 DIAGNOSIS — R29818 Other symptoms and signs involving the nervous system: Secondary | ICD-10-CM | POA: Diagnosis not present

## 2021-06-28 DIAGNOSIS — R41841 Cognitive communication deficit: Secondary | ICD-10-CM | POA: Diagnosis not present

## 2021-06-28 DIAGNOSIS — R293 Abnormal posture: Secondary | ICD-10-CM | POA: Diagnosis not present

## 2021-06-28 DIAGNOSIS — R2681 Unsteadiness on feet: Secondary | ICD-10-CM | POA: Diagnosis not present

## 2021-06-28 NOTE — Therapy (Signed)
?OUTPATIENT SPEECH LANGUAGE PATHOLOGY TREATMENT NOTE ? ? ?Patient Name: Cody Dickerson ?MRN: [id number] ?DOB:08-31-1950, 71 y.o., male ?Today's Date: 06/28/2021 ? ?PCP: Lawerance Cruel, MD ?REFERRING PROVIDER: Ludwig Clarks, DO  ? ?END OF SESSION:  ? End of Session - 06/28/21 1242   ? ? Visit Number 7   ? Number of Visits 13   ? Date for SLP Re-Evaluation 07/02/21   ? Authorization Type Medicare   ? Progress Note Due on Visit 10   ? SLP Start Time 9449   ? SLP Stop Time  1315   ? SLP Time Calculation (min) 33 min   ? Activity Tolerance Patient tolerated treatment well   ? ?  ?  ? ?  ? ? ? ? ?Past Medical History:  ?Diagnosis Date  ? Balance problem   ? Gout   ? Hypercholesterolemia   ? Hypertension   ? Small bowel obstruction ([place])   ? ?Past Surgical History:  ?Procedure Laterality Date  ? BOWEL BLOCKAGE  05/2019  ? ?Patient Active Problem List  ? Diagnosis Date Noted  ? Parkinson's disease ([place]) 02/06/2021  ? SBO (small bowel obstruction) ([place]) 07/06/2019  ? ? ?ONSET DATE: dx with Parkinson's December 2021; referral date 05/30/2021 ? ?REFERRING DIAG: G20 (ICD-10-CM) - Parkinson's disease ([place])  ? ?THERAPY DIAG:  ?Dysarthria and anarthria ? ?Cognitive communication deficit ? ?SUBJECTIVE: "It felt pretty good" re: Speak Out practice  ? ?PAIN:  ?Are you having pain? No ? ?OBJECTIVE:  ? ?TODAY'S TREATMENT: ?Skilled Treatment: ?06/28/21: Reports to using planner for managing schedule for himself and his wife. Led pt through a task in which he problem solved for changes to schedule. Discussion on not delaying, asking for call back, crossing out changes. Unable to teach back attention compensations this date. Pt entered with WNL conversational level during 10 minutes of conversational speech. Pt reports to working to speak slower and louder. Discussion on how ST can best support pt's memory changes. Will plan to teach use of cognitive support using cell phone as pt endorses this would be helpful because he does not know  how to use the tools available (calendar, reminders, etc) ? ?06/25/21: Speak Out! completed x1 over weekend. Completed Lesson #9 with intermittent mod A to optimize effort level for initial warm up exercises. Averages 74 and 72 dB during reading and cognitive tasks respectively given rare min A.  Conversational volume over 10 minutes averaged 70 dB with rare min A. SLP encouraged completion of HEP 1x/day for optimal carryover.  ? ?06/21/21: Pt reports to doing Speak Out! Lessons 5 and 6. Pt with usual success in meeting dB targets for counting, reading components (80+ dB) with rare min-A. Averages 73 dB during cognitive task IND. Notable difference when using intentional speech in structured tasks vs. Spontaneous speech. In spontaneous speech, volume is overall is adequate, nearing WNL, not affecting intelligibility (68-72 dB). Asked pt to teach back previously addressed attention/memory compensations. Rare min-A for recall.  ? ?06/18/21: Pt entered with ~WNL volume (averaged low 70s dB with range of upper 60s to low 70s dB). Intermittent hoarseness exhibited today, which pt reports occurs occasionally if talking extended periods of time. Some difficulty projecting at home, in which SLP assisted with reframing targeted volume as "one notch up on the volume" or "8/10 effort level." Completed Speak Out! Lesson #4 today. Intermittent min A required to optimize breath support due to hoarseness. Subsequent conversation averaged ~69 dB. SLP briefly reviewed targeted cognitive strategies and PD  support groups to optimize patient social communication and engagement.  ? ?PATIENT EDUCATION: ?Education details: see above ?Person educated: Patient ?Education method: Explanation, Demonstration, and Handouts ?Education comprehension: verbalized understanding, returned demonstration, and needs further education ?  ?  ?HOME EXERCISE PROGRAM: ?Speak Out lessons 13, 14, 15 ?  ?  ?GOALS: ?Goals reviewed with patient? Yes ?  ?SHORT TERM  GOALS: Target date: 07/02/2021 ?  ?Pt will verbalize and implement 2 attention and memory compensations to aid daily functioning given occasional min A over 2 sessions ?Baseline: 06-21-21 ?Goal status: ONGOING ?  ?2.  Pt will maintain WNL conversational volume (70-72 dB) in 10+ minute simple conversation given occasional min A over 2 sessions ?Baseline: 06-18-21, 06-25-21 ?Goal status: MET ?  ?3.  Pt will demonstrate ability to alternate attention successfully between conversation/structured tasks, using compensations, given occasional min A ?Baseline: n/a ?Goal status: ONGOING ?  ?  ?LONG TERM GOALS: Target date: 07/16/2021 ?  ?Pt will report improved cognitive linguistic functioning via PROM by 5 points at last ST session ?Baseline: 96 ?Goal status: ONGOING ?  ?2.  Pt will maintain WNL conversational volume (70-72 dB) in 20+ minute mod complex conversation given occasional min A over 2 sessions ?Baseline: n/a ?Goal status: ONGOING ?  ?3.  Pt will report successful implementation, with benefit, of x4 attention and memory compensation/strategies to aid in daily functioning over 1 week period ?Baseline: n/a ?Goal status: ONGOING ?  ?  ?ASSESSMENT: ?  ?CLINICAL IMPRESSION: ?Patient is a 71 y.o. 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 Pt reports significant difficulty with multitasking and tells ST if he does not "fully focus" on something then "poof, it's gone and I can't remember." These reports are supported by results from neuro-psych testing which ID'd attention, processing, and memory deficits. Pt also reports difficulties with mental flexibility or problem solving. Does well with routine but problems arise if unable to stick to routine (e.g. forgetting meds at normal time). Occasional difficulty with word finding, no overt word finding difficulties evidenced during today's evaluation. Pt demonstrates improving vocal  intensity throughout structured and unstructured speech tasks (avg 68 dB conversational speech). ST will also target use of cognitive compensations/strategies to accommodate MCI and increase pt's overall functioning at work and home.  ?  ?OBJECTIVE IMPAIRMENTS  ?Objective impairments include attention, memory, executive functioning, and dysarthria. These impairments are limiting patient from managing medications, managing appointments, managing finances, household responsibilities, and effectively communicating at home and in community.Factors affecting potential to achieve goals and functional outcome are medical prognosis.. Patient will benefit from skilled SLP services to address above impairments and improve overall function. ?  ?REHAB POTENTIAL: Good ?  ?PLAN: ?SLP FREQUENCY: 2x/week ?  ?SLP DURATION: 6 weeks ?  ?PLANNED INTERVENTIONS: Environmental controls, Cueing hierachy, Cognitive reorganization, Internal/external aids, Functional tasks, SLP instruction and feedback, and Compensatory strategies ?  ?  ?Su Monks, CCC-SLP ?06/28/2021, 12:44 PM  ? ?  ?

## 2021-07-01 NOTE — Therapy (Signed)
? ? ?OUTPATIENT OCCUPATIONAL THERAPY TREATMENT NOTE ? ? ?Patient Name: Cody Dickerson ?MRN: [id number] ?DOB:09/01/1950, 71 y.o., male ?Today's Date: 07/02/2021 ? ?PCP: Lawerance Cruel, MD ?REFERRING PROVIDER: Dr. Wells Guiles Tat  ? ?END OF SESSION:  ? OT End of Session - 07/02/21 3818   ? ? Visit Number 3   ? Number of Visits 17   ? Date for OT Re-Evaluation 08/10/21   ? Authorization Type Medicare & [employer], covered at 100%   ? Authorization - Visit Number 3   ? Authorization - Number of Visits 10   ? Progress Note Due on Visit 10   ? OT Start Time [phone]   ? OT Stop Time 1015   ? OT Time Calculation (min) 42 min   ? Activity Tolerance Patient tolerated treatment well   ? Behavior During Therapy [facility] for tasks assessed/performed   ? ?  ?  ? ?  ? ? ? ?Past Medical History:  ?Diagnosis Date  ? Balance problem   ? Gout   ? Hypercholesterolemia   ? Hypertension   ? Small bowel obstruction ([place])   ? ?Past Surgical History:  ?Procedure Laterality Date  ? BOWEL BLOCKAGE  05/2019  ? ?Patient Active Problem List  ? Diagnosis Date Noted  ? Parkinson's disease ([place]) 02/06/2021  ? SBO (small bowel obstruction) ([employer]) 07/06/2019  ? ? ?ONSET DATE: 05/30/21 (screen date)  ? ? ? ? ?THERAPY DIAG:  ?Other symptoms and signs involving the nervous system ? ?Other lack of coordination ? ?Abnormal posture ? ?Tremor ? ?Unsteadiness on feet ? ? ?PERTINENT HISTORY: Parkinson's Disease; Hypercholesterolemia, HTN, hx of gout, hx small bowel blockage, hx of falls, hx of dizziness ? ?PRECAUTIONS: fall ? ?SUBJECTIVE: pt denies pain  ? ?PAIN:  ?Are you having pain? no ? ? ? ? ?OBJECTIVE:  ? ?TODAY'S TREATMENT: ? ?  ?PWR! Moves (basic 4) in supine x 10-20 each with min cues For incr movement amplitude (step modified:  stepping x10 then bridges x10, no scoot) ? ?PWR! Moves (basic 4) in sitting x 10-20 each with min cues For incr movement amplitude. ? ?PWR! Hands (basic 4) x 10 each with min cues For incr movement amplitude. ? ?Began reviewing coordination  HEP:  flipping cards, dealing cards with thumb--all with BUEs and with min cueing for large amplitude movements. ? ? ?PATIENT EDUCATION: ?Education details: review PWR! Hands; PWR! Supine (issued); PWR! Sitting (review); Issued PD exercise chart ?Person educated: Patient ?Education method: Explanation, Demo ?Education comprehension: verbalized understanding, return demo ? ? ?HOME EXERCISE PROGRAM: ?06/25/21: reviewed PWR! Hands; 07/02/21:  issued PWR! Supine, reviewed PWR! Sitting, issued PD exercise chart ?ASSESSMENT: ? ?CLINICAL IMPRESSION: ?Pt is progressing towards goals and responds to cueing for incr movement amplitude.    ? ?PERFORMANCE DEFICITS in functional skills including ADLs, IADLs, coordination, dexterity, tone, ROM, FMC, GMC, mobility, balance, decreased knowledge of use of DME, and UE functional use, cognitive skills including attention, memory, and problem solving, and psychosocial skills including n/a.  ? ?IMPAIRMENTS are limiting patient from ADLs, IADLs, and leisure.  ? ?COMORBIDITIES may have co-morbidities  that affects occupational performance. Patient will benefit from skilled OT to address above impairments and improve overall function. ? ?MODIFICATION OR ASSISTANCE TO COMPLETE EVALUATION: No modification of tasks or assist necessary to complete an evaluation. ? ?OT OCCUPATIONAL PROFILE AND HISTORY: Detailed assessment: Review of records and additional review of physical, cognitive, psychosocial history related to current functional performance. ? ?CLINICAL DECISION MAKING: Moderate - several treatment  options, min-mod task modification necessary ? ?REHAB POTENTIAL: Good ? ?EVALUATION COMPLEXITY: Low ? ? ? ? ?GOALS: ?Potential Goals/POC reviewed with patient? Yes ? ?SHORT TERM GOALS: Target date: 07/09/2021 ? ?Pt will be independent with updated PD-specific HEP. ? ?Goal status: INITIAL ? ?2.  Pt will verbalize understanding of adaptive strategies to incr ease/safety with ADLs/IADLs (including  writing, shaving, buttoning, tying shoes, washing hair, eating, putting on jacket) ? ?Goal status: INITIAL ? ? ?LONG TERM GOALS: Target date: 08/06/2021 ? ?Pt will improve LUE functional reach and balance for ADLs/IADLs as shown by improving standing functional reach to at least 10" with LUE. ?Baseline:  8.5" ?Goal status: INITIAL ? ?2.  Pt will improve functional reach/coordination for ADLs as shown by improving score on box and blocks test by at least 3 with RUE. ?Baseline:  38 blocks ?Goal status: INITIAL ? ?3.  Pt will improve functional reach/coordination for ADLs as shown by improving score on box and blocks test by at least 3 with LUE. ?Baseline:  41 blocks ?Goal status: INITIAL ? ?4.  Pt will be able to reach overhead with at least -20* R elbow extension. ?Baseline: -30* ?Goal status: INITIAL ? ?5.  Pt will be able to reach overhead with at least -20* L elbow extension. ?Baseline:   -30 ?Goal status: INITIAL ? ? ? ?PLAN: ?OT FREQUENCY: 2x/week  ? ?OT DURATION: 6 weeks  (or 13 visits over 8 weeks due to scheduling) ? ?PLANNED INTERVENTIONS: self care/ADL training, therapeutic exercise, therapeutic activity, neuromuscular re-education, manual therapy, passive range of motion, balance training, functional mobility training, splinting, moist heat, cryotherapy, patient/family education, cognitive remediation/compensation, energy conservation, and DME and/or AE instructions ? ?RECOMMENDED OTHER SERVICES: current with PT, ST ? ?CONSULTED AND AGREED WITH PLAN OF CARE: Patient ? ?PLAN FOR NEXT SESSION:  Review supine PWR! Moves, continue reviewing coordination HEP ? ? ? ? Vianne Bulls, [place] ?07/02/2021, 10:12 AM ? ?Vianne Bulls, OTR/L ?[place] ?[place], [place]  [address] ?[phone] phone ?[phone] ?07/02/21 10:12 AM ? ? ? ? ? ? ? ?

## 2021-07-02 ENCOUNTER — Encounter: Payer: Self-pay | Admitting: Occupational Therapy

## 2021-07-02 ENCOUNTER — Encounter: Payer: Self-pay | Admitting: Physical Therapy

## 2021-07-02 ENCOUNTER — Ambulatory Visit: Payer: Medicare Other | Attending: Family Medicine | Admitting: Speech Pathology

## 2021-07-02 ENCOUNTER — Ambulatory Visit: Payer: Medicare Other | Admitting: Occupational Therapy

## 2021-07-02 ENCOUNTER — Ambulatory Visit: Payer: Medicare Other | Admitting: Physical Therapy

## 2021-07-02 DIAGNOSIS — R471 Dysarthria and anarthria: Secondary | ICD-10-CM | POA: Diagnosis not present

## 2021-07-02 DIAGNOSIS — R251 Tremor, unspecified: Secondary | ICD-10-CM

## 2021-07-02 DIAGNOSIS — R29818 Other symptoms and signs involving the nervous system: Secondary | ICD-10-CM | POA: Insufficient documentation

## 2021-07-02 DIAGNOSIS — R293 Abnormal posture: Secondary | ICD-10-CM | POA: Diagnosis not present

## 2021-07-02 DIAGNOSIS — R2681 Unsteadiness on feet: Secondary | ICD-10-CM | POA: Insufficient documentation

## 2021-07-02 DIAGNOSIS — R41841 Cognitive communication deficit: Secondary | ICD-10-CM | POA: Insufficient documentation

## 2021-07-02 DIAGNOSIS — R278 Other lack of coordination: Secondary | ICD-10-CM

## 2021-07-02 DIAGNOSIS — R2689 Other abnormalities of gait and mobility: Secondary | ICD-10-CM | POA: Insufficient documentation

## 2021-07-02 DIAGNOSIS — M6281 Muscle weakness (generalized): Secondary | ICD-10-CM | POA: Diagnosis not present

## 2021-07-02 DIAGNOSIS — R29898 Other symptoms and signs involving the musculoskeletal system: Secondary | ICD-10-CM | POA: Insufficient documentation

## 2021-07-02 DIAGNOSIS — R41840 Attention and concentration deficit: Secondary | ICD-10-CM | POA: Insufficient documentation

## 2021-07-02 NOTE — Therapy (Signed)
?OUTPATIENT SPEECH LANGUAGE PATHOLOGY TREATMENT NOTE ? ? ?Patient Name: Cody Dickerson ?MRN: [id number] ?DOB:[birth year], 71 y.o., male ?Today's Date: 07/02/2021 ? ?PCP: Lawerance Cruel, MD ?REFERRING PROVIDER: Ludwig Clarks, DO  ? ?END OF SESSION:  ? End of Session - 07/02/21 1103   ? ? Visit Number 8   ? Number of Visits 13   ? Date for SLP Re-Evaluation 07/02/21   ? Authorization Type Medicare   ? Progress Note Due on Visit 10   ? SLP Start Time 1104   ? SLP Stop Time  1145   ? SLP Time Calculation (min) 41 min   ? Activity Tolerance Patient tolerated treatment well   ? ?  ?  ? ?  ? ? ? ? ?Past Medical History:  ?Diagnosis Date  ? Balance problem   ? Gout   ? Hypercholesterolemia   ? Hypertension   ? Small bowel obstruction ([place])   ? ?Past Surgical History:  ?Procedure Laterality Date  ? BOWEL BLOCKAGE  05/2019  ? ?Patient Active Problem List  ? Diagnosis Date Noted  ? Parkinson's disease ([place]) 02/06/2021  ? SBO (small bowel obstruction) ([place]) 07/06/2019  ? ? ?ONSET DATE: dx with Parkinson's December 2021; referral date 05/30/2021 ? ?REFERRING DIAG: G20 (ICD-10-CM) - Parkinson's disease ([place])  ? ?THERAPY DIAG:  ?Dysarthria and anarthria ? ?Cognitive communication deficit ? ?SUBJECTIVE: "I did one practice this weekend" re: HEP ? ?PAIN:  ?Are you having pain? No ? ?OBJECTIVE:  ? ?TODAY'S TREATMENT: ?Skilled Treatment: ?07/02/21: Led pt through lesson 11 of Speak Out. Had pt demonstrates his exercises without prior SLP model to assess pt's ability to complete exercises during home practice. Usual min to mod A for meeting dB targets. Averages as follows: ?Loud "ah": 83 db ?Reading short story: 74 dB ?Cognitive task: 72 dB ?Target external memory compensation training. ST provides education on uses for reminders app on pt's iphone. Model how to implement a new reminder, to include location and date/time notifications. Pt able to demonstrate x2 with usual mod-A. Able to generate x2 situations in which use of this tool  would be useful. Will plan to provide re-education on this tool as indicated.  ? ? ?06/28/21: Reports to using planner for managing schedule for himself and his wife. Led pt through a task in which he problem solved for changes to schedule. Discussion on not delaying, asking for call back, crossing out changes. Unable to teach back attention compensations this date. Pt entered with WNL conversational level during 10 minutes of conversational speech. Pt reports to working to speak slower and louder. Discussion on how ST can best support pt's memory changes. Will plan to teach use of cognitive support using cell phone as pt endorses this would be helpful because he does not know how to use the tools available (calendar, reminders, etc) ? ?06/25/21: Speak Out! completed x1 over weekend. Completed Lesson #9 with intermittent mod A to optimize effort level for initial warm up exercises. Averages 74 and 72 dB during reading and cognitive tasks respectively given rare min A.  Conversational volume over 10 minutes averaged 70 dB with rare min A. SLP encouraged completion of HEP 1x/day for optimal carryover.  ? ?06/21/21: Pt reports to doing Speak Out! Lessons 5 and 6. Pt with usual success in meeting dB targets for counting, reading components (80+ dB) with rare min-A. Averages 73 dB during cognitive task IND. Notable difference when using intentional speech in structured tasks vs. Spontaneous speech. In spontaneous  speech, volume is overall is adequate, nearing WNL, not affecting intelligibility (68-72 dB). Asked pt to teach back previously addressed attention/memory compensations. Rare min-A for recall.  ? ?06/18/21: Pt entered with ~WNL volume (averaged low 70s dB with range of upper 60s to low 70s dB). Intermittent hoarseness exhibited today, which pt reports occurs occasionally if talking extended periods of time. Some difficulty projecting at home, in which SLP assisted with reframing targeted volume as "one notch up on  the volume" or "8/10 effort level." Completed Speak Out! Lesson #4 today. Intermittent min A required to optimize breath support due to hoarseness. Subsequent conversation averaged ~69 dB. SLP briefly reviewed targeted cognitive strategies and PD support groups to optimize patient social communication and engagement.  ? ?PATIENT EDUCATION: ?Education details: see above ?Person educated: Patient ?Education method: Explanation, Demonstration, and Handouts ?Education comprehension: verbalized understanding, returned demonstration, and needs further education ?  ?  ?HOME EXERCISE PROGRAM: ?Speak Out lessons 14, 15 ?  ?  ?GOALS: ?Goals reviewed with patient? Yes ?  ?SHORT TERM GOALS: Target date: 07/02/2021 ?  ?Pt will verbalize and implement 2 attention and memory compensations to aid daily functioning given occasional min A over 2 sessions ?Baseline: 06-21-21, 07-02-21 ?Goal status: PARTIALLY MET ?  ?2.  Pt will maintain WNL conversational volume (70-72 dB) in 10+ minute simple conversation given occasional min A over 2 sessions ?Baseline: 06-18-21, 06-25-21 ?Goal status: MET ?  ?3.  Pt will demonstrate ability to alternate attention successfully between conversation/structured tasks, using compensations, given occasional min A ?Baseline: 07/02/21 ?Goal status: MET ?  ?  ?LONG TERM GOALS: Target date: 07/16/2021 ?  ?Pt will report improved cognitive linguistic functioning via PROM by 5 points at last ST session ?Baseline: 96 ?Goal status: ONGOING ?  ?2.  Pt will maintain WNL conversational volume (70-72 dB) in 20+ minute mod complex conversation given occasional min A over 2 sessions ?Baseline: n/a ?Goal status: ONGOING ?  ?3.  Pt will report successful implementation, with benefit, of x4 attention and memory compensation/strategies to aid in daily functioning over 1 week period ?Baseline: n/a ?Goal status: ONGOING ?  ?  ?ASSESSMENT: ?  ?CLINICAL IMPRESSION: ?Patient is a 71 y.o. 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 Presents with conversational intensity grossly WNL, requires mod A to meet dB targets in structured tasks. Initiated training in tools available to pt on his cell phone to aid in memory deficits, to include prospective memory.  ?  ?OBJECTIVE IMPAIRMENTS  ?Objective impairments include attention, memory, executive functioning, and dysarthria. These impairments are limiting patient from managing medications, managing appointments, managing finances, household responsibilities, and effectively communicating at home and in community.Factors affecting potential to achieve goals and functional outcome are medical prognosis.. Patient will benefit from skilled SLP services to address above impairments and improve overall function. ?  ?REHAB POTENTIAL: Good ?  ?PLAN: ?SLP FREQUENCY: 2x/week ?  ?SLP DURATION: 6 weeks ?  ?PLANNED INTERVENTIONS: Environmental controls, Cueing hierachy, Cognitive reorganization, Internal/external aids, Functional tasks, SLP instruction and feedback, and Compensatory strategies ?  ?  ?Su Monks, CCC-SLP ?07/02/2021, 11:04 AM  ? ?  ?

## 2021-07-02 NOTE — Patient Instructions (Addendum)
?(  Exercise) Monday Tuesday Wednesday Thursday Friday Saturday Sunday  ? ?PWR! standing ?         ? ?PWR! sitting ?         ? ?PWR! Lying down ?         ? ?PWR! hands ?         ? ?Coordination ?         ? ? ?         ? ? ?         ? ? ?         ? ?Cycling Class ?              X     ? ? ? ? ? ? ? ?

## 2021-07-02 NOTE — Therapy (Signed)
?OUTPATIENT PHYSICAL THERAPY TREATMENT NOTE ? ? ?Patient Name: Cody Dickerson ?MRN: [id number] ?DOB:1950/09/16, 71 y.o., male ?Today's Date: 07/02/2021 ? ?PCP: Lawerance Cruel, MD ?REFERRING PROVIDER: Carles Collet Eustace Quail, DO  ? ?END OF SESSION:  ? PT End of Session - 07/02/21 1018   ? ? Visit Number 4   ? Number of Visits 9   ? Date for PT Re-Evaluation 08/06/21   due to potential delay in scheduling  ? Authorization Type Medicare   ? PT Start Time 1018   ? PT Stop Time 1058   ? PT Time Calculation (min) 40 min   ? Activity Tolerance Patient tolerated treatment well   ? Behavior During Therapy [facility] for tasks assessed/performed   ? ?  ?  ? ?  ? ? ? ?Past Medical History:  ?Diagnosis Date  ? Balance problem   ? Gout   ? Hypercholesterolemia   ? Hypertension   ? Small bowel obstruction ([place])   ? ?Past Surgical History:  ?Procedure Laterality Date  ? BOWEL BLOCKAGE  05/2019  ? ?Patient Active Problem List  ? Diagnosis Date Noted  ? Parkinson's disease ([place]) 02/06/2021  ? SBO (small bowel obstruction) ([place]) 07/06/2019  ? ? ?REFERRING DIAG: G20 (ICD-10-CM) - Parkinson's disease ([place])  ? ?THERAPY DIAG:  ?Other abnormalities of gait and mobility ? ?Unsteadiness on feet ? ?Muscle weakness (generalized) ? ?PERTINENT HISTORY: Lt distal ICA aneurysm, HTN, chronic dizziness and imbalance  ? ?PRECAUTIONS: Fall ? ?SUBJECTIVE: Asking about his exercises, feels like he has too many.  ? ?PAIN:  ?Are you having pain? Yes: NPRS scale: 2/10 ?Pain location: R shoulder ? ?OBJECTIVE:  ?  ?TODAY'S TREATMENT:  ? ?Reviewed entirety of pt's HEP - pt brought in his folder with all of his PT exercises from past 2 POCs (one when he was being seen for dizziness before he was diagnosed with PD) and condensed to help incr compliance at home. Threw away remainder of other exercise sheets so pt would not be confused.  ? ?NMR ?-sit <> stands with blue wedge incline under pt's toes for incr anterior weight shifting with sit <> stands x12 reps - cues to scoot  out towards edge first and perform with incr forward weight shift/nose over toes - pt with a few episodes of incr BLE bracing against mat table, then performed x5 reps without blue incline with pt with much better technique and weight shift with no episodes of retropulsion/bracing against mat.  ?-on rockerboard in corner with wall behind pt - had pt perform hip hinge and wall bump and then come off wall using hips first to shift weight anteriorly and then hold board in middle/anterior with tall posture for 5 seconds, performed 15 reps, initially pt demonstrating decr control when shifting weight anteriorly and would be unable to hold it and then would lose balance posteriorly against wall, did improve with incr reps.  ? ? ?Access Code: [email or web address] ?URL: [email or web address] ?Date: 07/02/2021 ?Prepared by: Janann August ? ?Finalized HEP for balance; plus pt doing standing PWR moves. See [place] for more information  ? ?Exercises ?- Heel Raises with Counter Support  - 1 x daily - 7 x weekly - 3 sets - 10 reps ?- Jumping Jacks  - 1 x daily - 7 x weekly - 3 sets - 10 reps ?- Colony at Progress 1 x daily - 7 x weekly - 3 sets - 10 reps - reviewed technique for this one as pt had  not been doing them at home.  ?- Standing Balance with Eyes Closed on Foam  - 1 x daily - 5 x weekly - 3 sets - 30 hold - with feet slightly apart on 2 pillows, cues for posture and trying to shift weight more anteriorly in middle of foot/toes.  ?- Romberg Stance with Head Nods on Foam Pad  - 1 x daily - 5 x weekly - 2 sets - 10 reps - with together on 2 pillows, cues for posture and trying to shift weight more anteriorly in middle of foot/toes.  ? ? ?  ?PATIENT EDUCATION: ?Education details: Updated/condensed pt's balance HEP as pt had too many exercises and updated pt's exercise chart (OT made this for him in previous session) to help with compliance with exercises at home.  ?Person educated:  Patient ?Education method: Explanation and Handout  ?Education comprehension: verbalized understanding  ?  ?  ?HOME EXERCISE PROGRAM: ?Standing PWR! Moves  ? ?Access Code: [email or web address] ? ?  ?  ?  ?GOALS: ?Goals reviewed with patient? Yes ?  ?SHORT TERM GOALS: N/A  ?  ?LONG TERM GOALS: Target date: 07/05/2021 ?  ?Pt will be independent with PD/balance specific HEP in order to build upon functional gains made in therapy.  ?Baseline:  ?Goal status: INITIAL ?  ?2.  Pt will recover posterior and R lateral balance in push and release test in 1 robust step independently, for improved balance recovery  ?Baseline: posterior = 3 steps, lateral to R = 2-3 steps ?Goal status: INITIAL ?  ?3.  Pt will improve miniBEST score to at least a 23/28 in order to demo decr fall risk.  ?Baseline: 20/28 ?Goal status: INITIAL ?  ?4.  Pt will improve TUG and TUG cog score to less than or equal to 10% difference for improved dual task/decreased fall risk.  ?Baseline: TUG 9.53, cog TUG: 12.97 ?Goal status: INITIAL ?  ?5.  Pt will improve condition 4 of mCTSIB to at least 20 sec in order to demo improved vestibular input for balance.  ?Baseline: 12 sec ?Goal status: INITIAL ?  ?  ?ASSESSMENT: ?  ?CLINICAL IMPRESSION: ?Worked on condensing pt's HEP for balance as pt has too many exercises from previous POCs and is unsure of which ones to do, esp corner balance exercises for incr vestibular input. Remainder of session continued to focus on anterior weight shifting with sit <> stands and use of rockerboard as pt has difficulty maintaining anterior weight shift and tends to lose balance posteriorly on heels. Pt did improve with incr reps.  Continue per POC.  ?  ?OBJECTIVE IMPAIRMENTS Abnormal gait, decreased activity tolerance, decreased balance, decreased coordination, decreased strength, dizziness, impaired flexibility, and postural dysfunction.  ?  ?ACTIVITY LIMITATIONS community activity.  ?  ?PERSONAL FACTORS Past/current experiences, Time since  onset of injury/illness/exacerbation, and 1-2 comorbidities:  PD, dizziness  are also affecting patient's functional outcome.  ?  ?  ?REHAB POTENTIAL: Good ?  ?CLINICAL DECISION MAKING: Stable/uncomplicated ?  ?EVALUATION COMPLEXITY: Low ?  ?PLAN: ?PT FREQUENCY: 2x/week ?  ?PT DURATION: 8 weeks ?  ?PLANNED INTERVENTIONS: Therapeutic exercises, Therapeutic activity, Neuromuscular re-education, Balance training, Gait training, Patient/Family education, Stair training, and Vestibular training ?  ?PLAN FOR NEXT SESSION: Check some of pt's LTGs and will need to update date to reflect 8 week POC.another one who had a delay in scheduling. ? SLS, stepping strategies - posterior and lateral. High amplitude movements, single leg stance, ANTERIOR WEIGHT SHIFTING ? ? ? ?Pooja Camuso  Cherlynn Kaiser, PT, DPT ?07/02/2021, 1:34 PM ? ?   ?

## 2021-07-05 ENCOUNTER — Ambulatory Visit: Payer: Medicare Other

## 2021-07-05 ENCOUNTER — Ambulatory Visit: Payer: Medicare Other | Admitting: Occupational Therapy

## 2021-07-05 ENCOUNTER — Ambulatory Visit: Payer: Medicare Other | Admitting: Physical Therapy

## 2021-07-05 DIAGNOSIS — R293 Abnormal posture: Secondary | ICD-10-CM

## 2021-07-05 DIAGNOSIS — R278 Other lack of coordination: Secondary | ICD-10-CM

## 2021-07-05 DIAGNOSIS — R2681 Unsteadiness on feet: Secondary | ICD-10-CM

## 2021-07-05 DIAGNOSIS — R29898 Other symptoms and signs involving the musculoskeletal system: Secondary | ICD-10-CM

## 2021-07-05 DIAGNOSIS — R471 Dysarthria and anarthria: Secondary | ICD-10-CM | POA: Diagnosis not present

## 2021-07-05 DIAGNOSIS — R29818 Other symptoms and signs involving the nervous system: Secondary | ICD-10-CM

## 2021-07-05 DIAGNOSIS — M6281 Muscle weakness (generalized): Secondary | ICD-10-CM

## 2021-07-05 DIAGNOSIS — R2689 Other abnormalities of gait and mobility: Secondary | ICD-10-CM

## 2021-07-05 DIAGNOSIS — R41841 Cognitive communication deficit: Secondary | ICD-10-CM

## 2021-07-05 DIAGNOSIS — R41840 Attention and concentration deficit: Secondary | ICD-10-CM

## 2021-07-05 DIAGNOSIS — R251 Tremor, unspecified: Secondary | ICD-10-CM

## 2021-07-05 NOTE — Therapy (Signed)
? ? ?OUTPATIENT OCCUPATIONAL THERAPY TREATMENT NOTE ? ? ?Patient Name: Cody Dickerson ?MRN: [id number] ?DOB:06/18/1950, 71 y.o., male ?Today's Date: 07/05/2021 ? ?PCP: Lawerance Cruel, MD ?REFERRING PROVIDER: Dr. Wells Guiles Tat  ? ?END OF SESSION:  ? OT End of Session - 07/05/21 1112   ? ? Visit Number 4   ? Number of Visits 17   ? Date for OT Re-Evaluation 08/10/21   ? Authorization Type Medicare & [employer], covered at 100%   ? Authorization - Visit Number 4   ? Authorization - Number of Visits 10   ? Progress Note Due on Visit 10   ? OT Start Time 1100   ? OT Stop Time 1143   ? OT Time Calculation (min) 43 min   ? Activity Tolerance Patient tolerated treatment well   ? Behavior During Therapy [facility] for tasks assessed/performed   ? ?  ?  ? ?  ? ? ? ? ?Past Medical History:  ?Diagnosis Date  ? Balance problem   ? Gout   ? Hypercholesterolemia   ? Hypertension   ? Small bowel obstruction ([place])   ? ?Past Surgical History:  ?Procedure Laterality Date  ? BOWEL BLOCKAGE  05/2019  ? ?Patient Active Problem List  ? Diagnosis Date Noted  ? Parkinson's disease ([place]) 02/06/2021  ? SBO (small bowel obstruction) ([place]) 07/06/2019  ? ? ?ONSET DATE: 05/30/21 (screen date)  ? ? ? ? ?THERAPY DIAG:  ?Other symptoms and signs involving the nervous system ? ?Other lack of coordination ? ?Abnormal posture ? ?Tremor ? ?Other abnormalities of gait and mobility ? ?Muscle weakness (generalized) ? ?Attention and concentration deficit ? ?Other symptoms and signs involving the musculoskeletal system ? ? ?PERTINENT HISTORY: Parkinson's Disease; Hypercholesterolemia, HTN, hx of gout, hx small bowel blockage, hx of falls, hx of dizziness ? ?PRECAUTIONS: fall ? ?SUBJECTIVE: pt denies pain  ? ?PAIN:  ?Are you having pain? no ? ? ? ? ?OBJECTIVE:  ? ?TODAY'S TREATMENT: ? ?  ?Reviewed PWR! Moves (basic 4) in supine x 10-20 each with min cues For incr movement amplitude (step modified:  stepping x10 then bridges x10, no scoot) ? ?PWR! Hands (basic 4) x 10 each  with min cues For incr movement amplitude. ? ?reviewed coordination HEP:  flipping cards, dealing cards with thumb- rotating 2 golf balls, tossing ball all with min cueing for large amplitude movements and with BUEs, ? ?Handwriting strategies, pt practiced with coban wrap on pen with improved legibility and letter size. Therapist wrapped pt's pen. ? ?PATIENT EDUCATION: ?07/05/21 Education details: review PWR! Hands; PWR! Moves in supine basic 4 reviewed ?Person educated: Patient ?Education method: Explanation, Demo ?Education comprehension: verbalized understanding, return demo ? ? ?HOME EXERCISE PROGRAM: ?06/25/21: reviewed PWR! Hands; 07/02/21:  issued PWR! Supine, reviewed PWR! Sitting, issued PD exercise chart ? ?07/05/21-coordination HEP, re-issued ? ?ASSESSMENT: ? ?CLINICAL IMPRESSION: ?Pt is progressing towards goals. Pt demonstrates understanding of coordination HEP following review. ? ?PERFORMANCE DEFICITS in functional skills including ADLs, IADLs, coordination, dexterity, tone, ROM, FMC, GMC, mobility, balance, decreased knowledge of use of DME, and UE functional use, cognitive skills including attention, memory, and problem solving, and psychosocial skills including n/a.  ? ?IMPAIRMENTS are limiting patient from ADLs, IADLs, and leisure.  ? ?COMORBIDITIES may have co-morbidities  that affects occupational performance. Patient will benefit from skilled OT to address above impairments and improve overall function. ? ?MODIFICATION OR ASSISTANCE TO COMPLETE EVALUATION: No modification of tasks or assist necessary to complete an evaluation. ? ?  OT OCCUPATIONAL PROFILE AND HISTORY: Detailed assessment: Review of records and additional review of physical, cognitive, psychosocial history related to current functional performance. ? ?CLINICAL DECISION MAKING: Moderate - several treatment options, min-mod task modification necessary ? ?REHAB POTENTIAL: Good ? ?EVALUATION COMPLEXITY: Low ? ? ? ? ?GOALS: ?Potential  Goals/POC reviewed with patient? Yes ? ?SHORT TERM GOALS: Target date: 07/09/2021 ? ?Pt will be independent with updated PD-specific HEP. ? ?Goal status: INITIAL ? ?2.  Pt will verbalize understanding of adaptive strategies to incr ease/safety with ADLs/IADLs (including writing, shaving, buttoning, tying shoes, washing hair, eating, putting on jacket) ? ?Goal status: INITIAL ? ? ?LONG TERM GOALS: Target date: 08/06/2021 ? ?Pt will improve LUE functional reach and balance for ADLs/IADLs as shown by improving standing functional reach to at least 10" with LUE. ?Baseline:  8.5" ?Goal status: INITIAL ? ?2.  Pt will improve functional reach/coordination for ADLs as shown by improving score on box and blocks test by at least 3 with RUE. ?Baseline:  38 blocks ?Goal status: INITIAL ? ?3.  Pt will improve functional reach/coordination for ADLs as shown by improving score on box and blocks test by at least 3 with LUE. ?Baseline:  41 blocks ?Goal status: INITIAL ? ?4.  Pt will be able to reach overhead with at least -20* R elbow extension. ?Baseline: -30* ?Goal status: INITIAL ? ?5.  Pt will be able to reach overhead with at least -20* L elbow extension. ?Baseline:   -30 ?Goal status: INITIAL ? ? ? ?PLAN: ?OT FREQUENCY: 2x/week  ? ?OT DURATION: 6 weeks  (or 13 visits over 8 weeks due to scheduling) ? ?PLANNED INTERVENTIONS: self care/ADL training, therapeutic exercise, therapeutic activity, neuromuscular re-education, manual therapy, passive range of motion, balance training, functional mobility training, splinting, moist heat, cryotherapy, patient/family education, cognitive remediation/compensation, energy conservation, and DME and/or AE instructions ? ?RECOMMENDED OTHER SERVICES: current with PT, ST ? ?CONSULTED AND AGREED WITH PLAN OF CARE: Patient ? ?PLAN FOR NEXT SESSION:  ADL strategies ? ? ?Lajarvis Italiano, OT ?07/05/2021, 11:20 AM ? ?Curt Bears Cherril Hett, OTR/L ?[phone] ?Phone: [phone] ?12:11 PM 07/05/21   ? ? ? ? ? ? ? ?

## 2021-07-05 NOTE — Therapy (Signed)
?OUTPATIENT SPEECH LANGUAGE PATHOLOGY TREATMENT NOTE (RECERT) ? ? ?Patient Name: Cody Dickerson ?MRN: [id number] ?DOB:[birth year], 71 y.o., male ?Today's Date: 07/05/2021 ? ?PCP: Lawerance Cruel, MD ?REFERRING PROVIDER: Ludwig Clarks, DO  ? ?END OF SESSION:  ? End of Session - 07/05/21 1059   ? ? Visit Number 9   ? Number of Visits 13   ? Date for SLP Re-Evaluation 09/32/35   recert  ? Authorization Type Medicare   ? SLP Start Time 1147   ? SLP Stop Time  1227   ? SLP Time Calculation (min) 40 min   ? Activity Tolerance Patient tolerated treatment well   ? ?  ?  ? ?  ? ? ? ? ?Past Medical History:  ?Diagnosis Date  ? Balance problem   ? Gout   ? Hypercholesterolemia   ? Hypertension   ? Small bowel obstruction ([place])   ? ?Past Surgical History:  ?Procedure Laterality Date  ? BOWEL BLOCKAGE  05/2019  ? ?Patient Active Problem List  ? Diagnosis Date Noted  ? Parkinson's disease ([place]) 02/06/2021  ? SBO (small bowel obstruction) ([place]) 07/06/2019  ? ? ?ONSET DATE: dx with Parkinson's December 2021; referral date 05/30/2021 ? ?REFERRING DIAG: G20 (ICD-10-CM) - Parkinson's disease ([place])  ? ?THERAPY DIAG:  ?Cognitive communication deficit ? ?Dysarthria and anarthria ? ?SUBJECTIVE: "I worked on my handwriting in [place]" ? ?PAIN:  ?Are you having pain? No ? ?OBJECTIVE:  ? ?TODAY'S TREATMENT: ?Skilled Treatment: ?07-05-21: Pt maintained grossly WNL conversational volume throughout entirety of ST session with mod I. Pt reported increased concentration on speaking with intent with good success. SLP continues to recommend completing Speak Out! Program at least one time per day to maximize outcomes and consistency. Seemingly good recall of tasks from earlier OT session and previous ST session this week. Targeted using phone as external memory aid. Assessed recall for how to set reminder in phone per training last ST session with intermittent mod A required. Education and demonstration provided to clear notification once task completed. Pt  endorsed benefit of reminder notification for afternoon medication. SLP assisted with additional phone modification to ease usage. Written handout provided to aid carryover of setting/clearing reminders, in which pt able to demonstrate with good accuracy following education.  ? ?07/02/21: Led pt through lesson 11 of Speak Out. Had pt demonstrates his exercises without prior SLP model to assess pt's ability to complete exercises during home practice. Usual min to mod A for meeting dB targets. Averages as follows: ?Loud "ah": 83 db ?Reading short story: 74 dB ?Cognitive task: 72 dB ?Target external memory compensation training. ST provides education on uses for reminders app on pt's iphone. Model how to implement a new reminder, to include location and date/time notifications. Pt able to demonstrate x2 with usual mod-A. Able to generate x2 situations in which use of this tool would be useful. Will plan to provide re-education on this tool as indicated.  ? ?06/28/21: Reports to using planner for managing schedule for himself and his wife. Led pt through a task in which he problem solved for changes to schedule. Discussion on not delaying, asking for call back, crossing out changes. Unable to teach back attention compensations this date. Pt entered with WNL conversational level during 10 minutes of conversational speech. Pt reports to working to speak slower and louder. Discussion on how ST can best support pt's memory changes. Will plan to teach use of cognitive support using cell phone as pt endorses this would  be helpful because he does not know how to use the tools available (calendar, reminders, etc) ? ?06/25/21: Speak Out! completed x1 over weekend. Completed Lesson #9 with intermittent mod A to optimize effort level for initial warm up exercises. Averages 74 and 72 dB during reading and cognitive tasks respectively given rare min A.  Conversational volume over 10 minutes averaged 70 dB with rare min A. SLP encouraged  completion of HEP 1x/day for optimal carryover.  ? ?06/21/21: Pt reports to doing Speak Out! Lessons 5 and 6. Pt with usual success in meeting dB targets for counting, reading components (80+ dB) with rare min-A. Averages 73 dB during cognitive task IND. Notable difference when using intentional speech in structured tasks vs. Spontaneous speech. In spontaneous speech, volume is overall is adequate, nearing WNL, not affecting intelligibility (68-72 dB). Asked pt to teach back previously addressed attention/memory compensations. Rare min-A for recall.  ? ? ?PATIENT EDUCATION: ?Education details: see above ?Person educated: Patient ?Education method: Explanation, Demonstration, and Handouts ?Education comprehension: verbalized understanding, returned demonstration, and needs further education ?  ?  ?HOME EXERCISE PROGRAM: ?Speak Out lessons  ?  ?  ?GOALS: ?Goals reviewed with patient? Yes ?  ?SHORT TERM GOALS: Target date: 07/02/2021 ?  ?Pt will verbalize and implement 2 attention and memory compensations to aid daily functioning given occasional min A over 2 sessions ?Baseline: 06-21-21, 07-02-21 ?Goal status: PARTIALLY MET ?  ?2.  Pt will maintain WNL conversational volume (70-72 dB) in 10+ minute simple conversation given occasional min A over 2 sessions ?Baseline: 06-18-21, 06-25-21 ?Goal status: MET ?  ?3.  Pt will demonstrate ability to alternate attention successfully between conversation/structured tasks, using compensations, given occasional min A ?Baseline: 07/02/21 ?Goal status: MET ?  ?  ?LONG TERM GOALS: Target date: 08/02/21 (ongoing for recert) ?  ?Pt will report improved cognitive linguistic functioning via PROM by 5 points at last ST session ?Baseline: 96 ?Goal status: ONGOING ?  ?2.  Pt will maintain WNL conversational volume (70-72 dB) in 20+ minute mod complex conversation given occasional min A over 2 sessions ?Baseline: 07-05-21 ?Goal status: ONGOING ?  ?3.  Pt will report successful implementation, with  benefit, of x4 attention and memory compensation/strategies to aid in daily functioning over 1 week period ?Baseline:  ?Goal status: ONGOING ?  ?  ?ASSESSMENT: ?  ?CLINICAL IMPRESSION: ?Patient is a 71 y.o. 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 Presents with conversational intensity grossly WNL, requires min to mod A to meet dB targets in structured tasks. Conducted ongoing training in tools available to pt on his cell phone to aid in memory deficits, to include prospective memory. Recert completed to extend for remaining 4 visits as pt is progressing towards ST goals.  ?  ?OBJECTIVE IMPAIRMENTS  ?Objective impairments include attention, memory, executive functioning, and dysarthria. These impairments are limiting patient from managing medications, managing appointments, managing finances, household responsibilities, and effectively communicating at home and in community.Factors affecting potential to achieve goals and functional outcome are medical prognosis.. Patient will benefit from skilled SLP services to address above impairments and improve overall function. ?  ?REHAB POTENTIAL: Good ?  ?PLAN: ?SLP FREQUENCY: 2x/week ?  ?SLP DURATION: 6 weeks ?  ?PLANNED INTERVENTIONS: Environmental controls, Cueing hierachy, Cognitive reorganization, Internal/external aids, Functional tasks, SLP instruction and feedback, and Compensatory strategies ?  ?  ?FAYSAL FENOGLIO, CCC-SLP ?07/05/2021, 12:45 PM  ? ?  ?

## 2021-07-05 NOTE — Therapy (Signed)
?OUTPATIENT PHYSICAL THERAPY TREATMENT NOTE - Goal assessment  ? ? ?Patient Name: Cody Dickerson ?MRN: [id number] ?DOB:09-17-1950, 71 y.o., male ?Today's Date: 07/05/2021 ? ?PCP: Lawerance Cruel, MD ?REFERRING PROVIDER: Carles Collet Eustace Quail, DO  ? ?END OF SESSION:  ? PT End of Session - 07/05/21 1323   ? ? Visit Number 5   ? Number of Visits 9   ? Date for PT Re-Evaluation 08/06/21   due to potential delay in scheduling  ? Authorization Type Medicare   ? PT Start Time 7829   ? PT Stop Time 1401   ? PT Time Calculation (min) 39 min   ? Activity Tolerance Patient tolerated treatment well   ? Behavior During Therapy [facility] for tasks assessed/performed   ? ?  ?  ? ?  ? ? ? ? ?Past Medical History:  ?Diagnosis Date  ? Balance problem   ? Gout   ? Hypercholesterolemia   ? Hypertension   ? Small bowel obstruction ([place])   ? ?Past Surgical History:  ?Procedure Laterality Date  ? BOWEL BLOCKAGE  05/2019  ? ?Patient Active Problem List  ? Diagnosis Date Noted  ? Parkinson's disease ([place]) 02/06/2021  ? SBO (small bowel obstruction) ([place]) 07/06/2019  ? ? ?REFERRING DIAG: G20 (ICD-10-CM) - Parkinson's disease ([place])  ? ?THERAPY DIAG:  ?Other abnormalities of gait and mobility ? ?Muscle weakness (generalized) ? ?Unsteadiness on feet ? ?PERTINENT HISTORY: Lt distal ICA aneurysm, HTN, chronic dizziness and imbalance  ? ?PRECAUTIONS: Fall ? ?SUBJECTIVE: No new changes to report   ? ?PAIN:  ?Are you having pain? No ? ?OBJECTIVE:  ?  ?TODAY'S TREATMENT:  ?Ther Act ?LTG Assessment  ?  ? [facility] PT Assessment - 07/05/21 1405   ? ?  ? Timed Up and Go Test  ? Normal TUG (seconds) 10   ? Cognitive TUG (seconds) 10.66   ? TUG Comments <10% difference   ? ?  ?  ? ?  ?  mCTSIB condition 4: 20.58s  ? ?NMR ?-Sit <> stands holding 5# KB in front rack position w/single arm for improved anterior weight shifting and immediate standing balance, x10 per side. Pt demonstrated 1-2 retropulsion episodes per side but was self-aware and attempted to correct.   ?-Progressed to adding blue wedge under bilat feet to bias posterior lean, 2x10 w/KB in front rack position in RUE. Pt w/significant difficulty performing task without losing balance posteriorly. Provided cues for "nose over toes" and noted pt quickly extending knees prior to extending hips, causing him to push himself posteriorly. Made pt aware of this and he was able to perform 5 reps in a row without posterior LOB.  ?-Alt. Fwd lunge w/slam ball using 2kg ball for improved stepping strategy, anterior weight shift and high amplitude movement. Pt performed x12 per side and frequently would not attempt to catch ball due to instability w/leaning forward. Min guard throughout for steadying assist. Noted pt would not step RLE as far forward as LLE and frequently lost balance when RLE forward.  ? ? ?  ?PATIENT EDUCATION: ?Education details: Encouraged to practice proper sit <>stand technique throughout day to facilitate breaking old habit and reducing frequency and reliance on posterior lean  ?Person educated: Patient ?Education method: Explanation and Handout  ?Education comprehension: verbalized understanding  ?  ?  ?HOME EXERCISE PROGRAM: ?Standing PWR! Moves  ? ?Access Code: [email or web address] ?URL: [email or web address] ?Date: 07/02/2021 ?Prepared by: Janann August ? ?Finalized HEP for balance; plus pt doing standing PWR moves. See  [place] for more information  ? ?Exercises ?- Heel Raises with Counter Support  - 1 x daily - 7 x weekly - 3 sets - 10 reps ?- Jumping Jacks  - 1 x daily - 7 x weekly - 3 sets - 10 reps ?- Gobles at [place] 1 x daily - 7 x weekly - 3 sets - 10 reps - reviewed technique for this one as pt had not been doing them at home.  ?- Standing Balance with Eyes Closed on Foam  - 1 x daily - 5 x weekly - 3 sets - 30 hold - with feet slightly apart on 2 pillows, cues for posture and trying to shift weight more anteriorly in middle of foot/toes.  ?- Romberg Stance with Head  Nods on Foam Pad  - 1 x daily - 5 x weekly - 2 sets - 10 reps - with together on 2 pillows, cues for posture and trying to shift weight more anteriorly in middle of foot/toes.  ? ?  ?  ?  ?GOALS: ?Goals reviewed with patient? Yes ?  ?SHORT TERM GOALS: N/A  ?  ?LONG TERM GOALS: Updated Target date (due to delay in scheduling: 08/02/2021 (8 weeks)  ?  ?Pt will be independent with PD/balance specific HEP in order to build upon functional gains made in therapy.  ?Baseline:  ?Goal status: PROGRESSING ?  ?2.  Pt will recover posterior and R lateral balance in push and release test in 1 robust step independently, for improved balance recovery  ?Baseline: posterior = 3 steps, lateral to R = 2-3 steps ?Goal status: INITIAL ?  ?3.  Pt will improve miniBEST score to at least a 23/28 in order to demo decr fall risk.  ?Baseline: 20/28 ?Goal status: INITIAL ?  ?4.  Pt will improve TUG and TUG cog score to less than or equal to 10% difference for improved dual task/decreased fall risk.  ?Baseline: TUG 9.53, cog TUG: 12.97; TUG 10s, cog TUG 10.66s on 07/05/21  ?Goal status: MET  ?  ?5.  Pt will improve condition 4 of mCTSIB to at least 20 sec in order to demo improved vestibular input for balance.  ?Baseline: 12 sec; 20.58 sec on 07/05/21  ?Goal status: MET  ?  ?  ?ASSESSMENT: ?  ?CLINICAL IMPRESSION: ?Emphasis of skilled session on starting LTG assessment, anterior weight shifting and high amplitude movement. Pt has met 2 of 5 LTGs so far and is progressing the remaining 3. Extended goal date to 8 weeks from eval due to delay in scheduling.  Pt continues to demonstrate retropulsion w/sit <>stands due to extending bilat knees too early and thrusting himself posteriorly. Continued to work on anterior weight shift and proper sit <>stand biomechanics w/added posterior lean bias to facilitate proper technique and immediate standing balance. Continue POC.  ?  ?OBJECTIVE IMPAIRMENTS Abnormal gait, decreased activity tolerance, decreased  balance, decreased coordination, decreased strength, dizziness, impaired flexibility, and postural dysfunction.  ?  ?ACTIVITY LIMITATIONS community activity.  ?  ?PERSONAL FACTORS Past/current experiences, Time since onset of injury/illness/exacerbation, and 1-2 comorbidities:  PD, dizziness  are also affecting patient's functional outcome.  ?  ?  ?REHAB POTENTIAL: Good ?  ?CLINICAL DECISION MAKING: Stable/uncomplicated ?  ?EVALUATION COMPLEXITY: Low ?  ?PLAN: ?PT FREQUENCY: 2x/week ?  ?PT DURATION: 8 weeks ?  ?PLANNED INTERVENTIONS: Therapeutic exercises, Therapeutic activity, Neuromuscular re-education, Balance training, Gait training, Patient/Family education, Stair training, and Vestibular training ?  ?PLAN FOR NEXT SESSION: How  is HEP? SLS, stepping strategies - posterior and lateral. High amplitude movements, single leg stance, ANTERIOR WEIGHT SHIFTING, sit <>stands ? ? ?Cruzita Lederer Jabreel Chimento, PT, DPT ?07/05/2021, 2:05 PM ? ?   ?

## 2021-07-05 NOTE — Patient Instructions (Signed)
If you want to set a reminder,  ? Select "Reminders" app ? Select "New Reminder" in blue writing at bottom ? Add title to write what you need to remember  ? Set the date and time  ? Select "repeat" if you want it to go off again another time ? ?To remove the reminder notification,  ? Go into the Reminders app and hit the white circle to mark as completed OR ? Press and hold on the reminder notification on your lock screen (picture of Roxy) then select "mark as completed" ?You can always select "reminder me in an hour" if you need more time to complete the task  ?

## 2021-07-05 NOTE — Patient Instructions (Signed)
Coordination Exercises ? ?Perform the following exercises for 20 minutes 1 times per day. Perform with both hand(s). Perform using big movements. ? ?Flipping Cards: Place deck of cards on the table. Flip cards over by opening your hand big to grasp and then turn your palm up big. ?Deal cards: Hold 1/2 or whole deck in your hand. Use thumb to push card off top of deck with one big push. ?Rotate ball with fingertips: Pick up with fingers/thumb and move as much as you can with each turn/movement (clockwise and counter-clockwise). ?Toss ball from one hand to the other: Toss big/high. ?Toss ball in the air and catch with the same hand: Toss big/high. ?Rotate 2 golf balls in your hand: Both directions. ?Pick up coins and stack one at a time: Pick up with big, intentional movements. Do not drag coin to the edge. (5-10 in a stack) ?Pick up 5-10 coins one at a time and hold in palm. Then, move coins from palm to fingertips one at time and place in coin bank/container. ?Practice writing: Slow down, write big, and focus on forming each letter. ?Perform "Flicks"/hand stretches (PWR! Hands): Close hands then flick out your fingers with focus on opening hands, pulling wrists back, and extending elbows like you are pushing. ?

## 2021-07-09 ENCOUNTER — Ambulatory Visit: Payer: Medicare Other | Admitting: Occupational Therapy

## 2021-07-09 DIAGNOSIS — R293 Abnormal posture: Secondary | ICD-10-CM

## 2021-07-09 DIAGNOSIS — R41841 Cognitive communication deficit: Secondary | ICD-10-CM | POA: Diagnosis not present

## 2021-07-09 DIAGNOSIS — R278 Other lack of coordination: Secondary | ICD-10-CM | POA: Diagnosis not present

## 2021-07-09 DIAGNOSIS — R29898 Other symptoms and signs involving the musculoskeletal system: Secondary | ICD-10-CM

## 2021-07-09 DIAGNOSIS — R41840 Attention and concentration deficit: Secondary | ICD-10-CM

## 2021-07-09 DIAGNOSIS — R2681 Unsteadiness on feet: Secondary | ICD-10-CM | POA: Diagnosis not present

## 2021-07-09 DIAGNOSIS — R251 Tremor, unspecified: Secondary | ICD-10-CM

## 2021-07-09 DIAGNOSIS — R29818 Other symptoms and signs involving the nervous system: Secondary | ICD-10-CM

## 2021-07-09 DIAGNOSIS — M6281 Muscle weakness (generalized): Secondary | ICD-10-CM

## 2021-07-09 DIAGNOSIS — R471 Dysarthria and anarthria: Secondary | ICD-10-CM | POA: Diagnosis not present

## 2021-07-09 NOTE — Therapy (Signed)
? ? ?OUTPATIENT OCCUPATIONAL THERAPY TREATMENT NOTE ? ? ?Patient Name: Cody Dickerson ?MRN: [id number] ?DOB:10-04-1950, 71 y.o., male ?Today's Date: 07/09/2021 ? ?PCP: Lawerance Cruel, MD ?REFERRING PROVIDER: Dr. Wells Guiles Tat  ? ?END OF SESSION:  ? OT End of Session - 07/09/21 1405   ? ? Visit Number 5   ? Number of Visits 17   ? Date for OT Re-Evaluation 08/10/21   ? Authorization Type Medicare & [employer], covered at 100%   ? Authorization - Visit Number 5   ? Authorization - Number of Visits 10   ? OT Start Time 1405   ? OT Stop Time 6761   ? OT Time Calculation (min) 40 min   ? Activity Tolerance Patient tolerated treatment well   ? Behavior During Therapy [facility] for tasks assessed/performed   ? ?  ?  ? ?  ? ? ? ? ?Past Medical History:  ?Diagnosis Date  ? Balance problem   ? Gout   ? Hypercholesterolemia   ? Hypertension   ? Small bowel obstruction ([place])   ? ?Past Surgical History:  ?Procedure Laterality Date  ? BOWEL BLOCKAGE  05/2019  ? ?Patient Active Problem List  ? Diagnosis Date Noted  ? Parkinson's disease ([place]) 02/06/2021  ? SBO (small bowel obstruction) ([place]) 07/06/2019  ? ? ?ONSET DATE: 05/30/21 (screen date)  ? ? ? ? ?THERAPY DIAG:  ?Muscle weakness (generalized) ? ?Unsteadiness on feet ? ?Other symptoms and signs involving the nervous system ? ?Other lack of coordination ? ?Abnormal posture ? ?Attention and concentration deficit ? ?Tremor ? ?Other symptoms and signs involving the musculoskeletal system ? ? ?PERTINENT HISTORY: Parkinson's Disease; Hypercholesterolemia, HTN, hx of gout, hx small bowel blockage, hx of falls, hx of dizziness ? ?PRECAUTIONS: fall ? ?SUBJECTIVE: pt denies pain  ? ?PAIN:  ?Are you having pain? no ? ? ? ? ?OBJECTIVE:  ? ?TODAY'S TREATMENT: ? ?  ?Educated pt regarding PWR! Moves (basic 4) in modified quadraped x 10 each with min cues For I amplitude and foot placement- did not issue yet, needs reinforcement  ?Arm bike x 6 mins level 1 for reciprocal movement and conditioning, pt  maintained .40 rpm ?PATIENT EDUCATION: ? Education details: ADL strategies for shaving, buttoning, donning jacket,cutting and scooping food  ?Person educated: Patient ?Education method: Explanation, Demo, min v.c ?Education comprehension: verbalized understanding, return demo, min v.c ? ? ?HOME EXERCISE PROGRAM: ?06/25/21: reviewed PWR! Hands;  ?07/02/21:  issued PWR! Supine, reviewed PWR! Sitting, issued PD exercise chart ? ?07/05/21-coordination HEP, re-issued ? ?ASSESSMENT: ? ?CLINICAL IMPRESSION: ?Pt is progressing towards goals. Pt verbalized understanding of ADL strategies reviewed today. ? ?PERFORMANCE DEFICITS in functional skills including ADLs, IADLs, coordination, dexterity, tone, ROM, FMC, GMC, mobility, balance, decreased knowledge of use of DME, and UE functional use, cognitive skills including attention, memory, and problem solving, and psychosocial skills including n/a.  ? ?IMPAIRMENTS are limiting patient from ADLs, IADLs, and leisure.  ? ?COMORBIDITIES may have co-morbidities  that affects occupational performance. Patient will benefit from skilled OT to address above impairments and improve overall function. ? ?MODIFICATION OR ASSISTANCE TO COMPLETE EVALUATION: No modification of tasks or assist necessary to complete an evaluation. ? ?OT OCCUPATIONAL PROFILE AND HISTORY: Detailed assessment: Review of records and additional review of physical, cognitive, psychosocial history related to current functional performance. ? ?CLINICAL DECISION MAKING: Moderate - several treatment options, min-mod task modification necessary ? ?REHAB POTENTIAL: Good ? ?EVALUATION COMPLEXITY: Low ? ?+ ? ? ?GOALS: ?Potential Goals/POC reviewed  with patient? Yes ? ?SHORT TERM GOALS: Target date: 07/09/2021 ? ?Pt will be independent with updated PD-specific HEP. ? ?Goal status: INITIAL ? ?2.  Pt will verbalize understanding of adaptive strategies to incr ease/safety with ADLs/IADLs (including writing, shaving, buttoning, tying  shoes, washing hair, eating, putting on jacket) ? ?Goal status: all reviewed with pt, he may benefit from reinforcement ? ? ?LONG TERM GOALS: Target date: 08/06/2021 ? ?Pt will improve LUE functional reach and balance for ADLs/IADLs as shown by improving standing functional reach to at least 10" with LUE. ?Baseline:  8.5" ?Goal status: INITIAL ? ?2.  Pt will improve functional reach/coordination for ADLs as shown by improving score on box and blocks test by at least 3 with RUE. ?Baseline:  38 blocks ?Goal status: INITIAL ? ?3.  Pt will improve functional reach/coordination for ADLs as shown by improving score on box and blocks test by at least 3 with LUE. ?Baseline:  41 blocks ?Goal status: INITIAL ? ?4.  Pt will be able to reach overhead with at least -20* R elbow extension. ?Baseline: -30* ?Goal status: INITIAL ? ?5.  Pt will be able to reach overhead with at least -20* L elbow extension. ?Baseline:   -30 ?Goal status: INITIAL ? ? ? ?PLAN: ?OT FREQUENCY: 2x/week  ? ?OT DURATION: 6 weeks  (or 13 visits over 8 weeks due to scheduling) ? ?PLANNED INTERVENTIONS: self care/ADL training, therapeutic exercise, therapeutic activity, neuromuscular re-education, manual therapy, passive range of motion, balance training, functional mobility training, splinting, moist heat, cryotherapy, patient/family education, cognitive remediation/compensation, energy conservation, and DME and/or AE instructions ? ?RECOMMENDED OTHER SERVICES: current with PT, ST ? ?CONSULTED AND AGREED WITH PLAN OF CARE: Patient ? ?PLAN FOR NEXT SESSION:  Review PWR! Modified quadraped, issue if appropriate ? ? ?Panagiota Perfetti, OT ?07/09/2021, 2:45 PM ? ?Curt Bears Brees [place], OTR/L ?[phone] ?Phone: [phone] ?2:45 PM 07/09/21  ? ? ? ? ? ? ? ?

## 2021-07-10 ENCOUNTER — Ambulatory Visit: Payer: Medicare Other | Admitting: Physical Therapy

## 2021-07-10 DIAGNOSIS — R2681 Unsteadiness on feet: Secondary | ICD-10-CM

## 2021-07-10 DIAGNOSIS — R29818 Other symptoms and signs involving the nervous system: Secondary | ICD-10-CM | POA: Diagnosis not present

## 2021-07-10 DIAGNOSIS — R41841 Cognitive communication deficit: Secondary | ICD-10-CM | POA: Diagnosis not present

## 2021-07-10 DIAGNOSIS — R293 Abnormal posture: Secondary | ICD-10-CM | POA: Diagnosis not present

## 2021-07-10 DIAGNOSIS — R2689 Other abnormalities of gait and mobility: Secondary | ICD-10-CM

## 2021-07-10 DIAGNOSIS — R278 Other lack of coordination: Secondary | ICD-10-CM | POA: Diagnosis not present

## 2021-07-10 DIAGNOSIS — M6281 Muscle weakness (generalized): Secondary | ICD-10-CM

## 2021-07-10 DIAGNOSIS — R471 Dysarthria and anarthria: Secondary | ICD-10-CM | POA: Diagnosis not present

## 2021-07-10 NOTE — Therapy (Addendum)
?OUTPATIENT PHYSICAL THERAPY TREATMENT NOTE  ? ?Patient Name: Cody Dickerson ?MRN: [id number] ?DOB:03/12/1950, 71 y.o., male ?Today's Date: 07/10/2021 ? ?PCP: Lawerance Cruel, MD ?REFERRING PROVIDER: Carles Collet Eustace Quail, DO  ? ?END OF SESSION:  ? PT End of Session - 07/10/21 1534   ? ? Visit Number 6   ? Number of Visits 9   ? Date for PT Re-Evaluation 08/06/21   ? Authorization Type Medicare   ? PT Start Time 1533   ? PT Stop Time 8250   ? PT Time Calculation (min) 38 min   ? Activity Tolerance Patient tolerated treatment well   ? Behavior During Therapy [facility] for tasks assessed/performed   ? ?  ?  ? ?  ? ? ? ? ? ?Past Medical History:  ?Diagnosis Date  ? Balance problem   ? Gout   ? Hypercholesterolemia   ? Hypertension   ? Small bowel obstruction ([place])   ? ?Past Surgical History:  ?Procedure Laterality Date  ? BOWEL BLOCKAGE  05/2019  ? ?Patient Active Problem List  ? Diagnosis Date Noted  ? Parkinson's disease ([place]) 02/06/2021  ? SBO (small bowel obstruction) ([place]) 07/06/2019  ? ? ?REFERRING DIAG: G20 (ICD-10-CM) - Parkinson's disease ([place])  ? ?THERAPY DIAG:  ?Other abnormalities of gait and mobility ? ?Unsteadiness on feet ? ?Muscle weakness (generalized) ? ?PERTINENT HISTORY: Lt distal ICA aneurysm, HTN, chronic dizziness and imbalance  ? ?PRECAUTIONS: Fall ? ?SUBJECTIVE: No new changes to report   ? ?PAIN:  ?Are you having pain? No ? ?OBJECTIVE:  ?  ?TODAY'S TREATMENT:  ?Ther Ex  ?SciFit level 4 on mutli peak setting for 9 minutes using BUE/BLEs for neural priming for reciprocal movement and dynamic cardiovascular warmup.  ? ?NMR ?Using tech standing in [place], sled pushes [phone] for improved anterior weight shifting and PF push-off strength bilaterally. Added posterior resistance (bungee belt) for increased anterior weight shift x460' w/two therapists and one tech (in [place]). Progressed to adding posterior perturbations x230' for reactive stepping strategy. Noted improved push-off w/toes bilaterally and turn  management. CGA throughout ? ?Wall balls using yellow 2kg ball to two post-its posted 10' superolaterally on wall for improved anterior weight shifting, set-switching and high amplitude movement. Performed x10 per side and progressed to adding cog dual-task (naming veggies when throwing to L and fruits to R). Noted increased difficulty when throwing to R > L and frequent loss of squat depth throughout compared to task without cog component. CGA throughout.  ? ?  ?PATIENT EDUCATION: ?Education details: Continue to practice proper sit <>stand technique throughout day to facilitate breaking old habit and reducing frequency and reliance on posterior lean  ?Person educated: Patient ?Education method: Explanation  ?Education comprehension: verbalized understanding  ?  ?  ?HOME EXERCISE PROGRAM: ?Standing PWR! Moves  ? ?Access Code: [email or web address] ?URL: [email or web address] ?Date: 07/02/2021 ?Prepared by: Janann August ? ?Finalized HEP for balance; plus pt doing standing PWR moves. See [place] for more information  ? ?Exercises ?- Heel Raises with Counter Support  - 1 x daily - 7 x weekly - 3 sets - 10 reps ?- Jumping Jacks  - 1 x daily - 7 x weekly - 3 sets - 10 reps ?- [place] at [place] 1 x daily - 7 x weekly - 3 sets - 10 reps - reviewed technique for this one as pt had not been doing them at home.  ?- Standing Balance with Eyes Closed on Foam  - 1 x  daily - 5 x weekly - 3 sets - 30 hold - with feet slightly apart on 2 pillows, cues for posture and trying to shift weight more anteriorly in middle of foot/toes.  ?- Romberg Stance with Head Nods on Foam Pad  - 1 x daily - 5 x weekly - 2 sets - 10 reps - with together on 2 pillows, cues for posture and trying to shift weight more anteriorly in middle of foot/toes.  ? ?  ?  ?  ?GOALS: ?Goals reviewed with patient? Yes ?  ?SHORT TERM GOALS: N/A  ?  ?LONG TERM GOALS: Updated Target date (due to delay in scheduling: 08/02/2021 (8 weeks)  ?  ?Pt  will be independent with PD/balance specific HEP in order to build upon functional gains made in therapy.  ?Baseline:  ?Goal status: PROGRESSING ?  ?2.  Pt will recover posterior and R lateral balance in push and release test in 1 robust step independently, for improved balance recovery  ?Baseline: posterior = 3 steps, lateral to R = 2-3 steps ?Goal status: INITIAL ?  ?3.  Pt will improve miniBEST score to at least a 23/28 in order to demo decr fall risk.  ?Baseline: 20/28 ?Goal status: INITIAL ?  ?4.  Pt will improve TUG and TUG cog score to less than or equal to 10% difference for improved dual task/decreased fall risk.  ?Baseline: TUG 9.53, cog TUG: 12.97; TUG 10s, cog TUG 10.66s on 07/05/21  ?Goal status: MET  ?  ?5.  Pt will improve condition 4 of mCTSIB to at least 20 sec in order to demo improved vestibular input for balance.  ?Baseline: 12 sec; 20.58 sec on 07/05/21  ?Goal status: MET  ?  ?  ?ASSESSMENT: ?  ?CLINICAL IMPRESSION: ?Emphasis of skilled PT session on anterior weight shifting and dual-tasking. Pt able to push off toes bilaterally w/sled pushes once posterior resistance added and fatigued quickly. Pt continues to demonstrate difficulty w/dual tasks (especially cog-motor) but was able to maintain motor component of task today more so than in previous sessions. Continue POC.  ? ?  ?OBJECTIVE IMPAIRMENTS Abnormal gait, decreased activity tolerance, decreased balance, decreased coordination, decreased strength, dizziness, impaired flexibility, and postural dysfunction.  ?  ?ACTIVITY LIMITATIONS community activity.  ?  ?PERSONAL FACTORS Past/current experiences, Time since onset of injury/illness/exacerbation, and 1-2 comorbidities:  PD, dizziness  are also affecting patient's functional outcome.  ?  ?  ?REHAB POTENTIAL: Good ?  ?CLINICAL DECISION MAKING: Stable/uncomplicated ?  ?EVALUATION COMPLEXITY: Low ?  ?PLAN: ?PT FREQUENCY: 2x/week ?  ?PT DURATION: 8 weeks ?  ?PLANNED INTERVENTIONS: Therapeutic  exercises, Therapeutic activity, Neuromuscular re-education, Balance training, Gait training, Patient/Family education, Stair training, and Vestibular training ?  ?PLAN FOR NEXT SESSION: How is HEP? SLS, stepping strategies - posterior and lateral. High amplitude movements, single leg stance, ANTERIOR WEIGHT SHIFTING, sit <>stands ? ? ?Cruzita Lederer Himani Corona, PT, DPT ?07/10/2021, 4:12 PM ? ?   ?

## 2021-07-12 ENCOUNTER — Ambulatory Visit: Payer: Medicare Other | Admitting: Physical Therapy

## 2021-07-12 DIAGNOSIS — R2689 Other abnormalities of gait and mobility: Secondary | ICD-10-CM

## 2021-07-12 DIAGNOSIS — R293 Abnormal posture: Secondary | ICD-10-CM | POA: Diagnosis not present

## 2021-07-12 DIAGNOSIS — R29818 Other symptoms and signs involving the nervous system: Secondary | ICD-10-CM | POA: Diagnosis not present

## 2021-07-12 DIAGNOSIS — R2681 Unsteadiness on feet: Secondary | ICD-10-CM

## 2021-07-12 DIAGNOSIS — R278 Other lack of coordination: Secondary | ICD-10-CM | POA: Diagnosis not present

## 2021-07-12 DIAGNOSIS — M6281 Muscle weakness (generalized): Secondary | ICD-10-CM

## 2021-07-12 DIAGNOSIS — R41841 Cognitive communication deficit: Secondary | ICD-10-CM | POA: Diagnosis not present

## 2021-07-12 DIAGNOSIS — R471 Dysarthria and anarthria: Secondary | ICD-10-CM | POA: Diagnosis not present

## 2021-07-12 NOTE — Therapy (Signed)
?OUTPATIENT PHYSICAL THERAPY TREATMENT NOTE  ? ?Patient Name: Cody Dickerson ?MRN: [id number] ?DOB:19-Feb-1951, 71 y.o., male ?Today's Date: 07/12/2021 ? ?PCP: Lawerance Cruel, MD ?REFERRING PROVIDER: Carles Collet Eustace Quail, DO  ? ?END OF SESSION:  ? PT End of Session - 07/12/21 1538   ? ? Visit Number 7   ? Number of Visits 9   ? Date for PT Re-Evaluation 08/06/21   ? Authorization Type Medicare   ? PT Start Time 1536   ? PT Stop Time 1615   ? PT Time Calculation (min) 39 min   ? Activity Tolerance Patient tolerated treatment well   ? Behavior During Therapy [facility] for tasks assessed/performed   ? ?  ?  ? ?  ? ? ? ? ? ?Past Medical History:  ?Diagnosis Date  ? Balance problem   ? Gout   ? Hypercholesterolemia   ? Hypertension   ? Small bowel obstruction ([place])   ? ?Past Surgical History:  ?Procedure Laterality Date  ? BOWEL BLOCKAGE  05/2019  ? ?Patient Active Problem List  ? Diagnosis Date Noted  ? Parkinson's disease ([place]) 02/06/2021  ? SBO (small bowel obstruction) ([place]) 07/06/2019  ? ? ?REFERRING DIAG: G20 (ICD-10-CM) - Parkinson's disease ([place])  ? ?THERAPY DIAG:  ?Unsteadiness on feet ? ?Other abnormalities of gait and mobility ? ?Muscle weakness (generalized) ? ?PERTINENT HISTORY: Lt distal ICA aneurysm, HTN, chronic dizziness and imbalance  ? ?PRECAUTIONS: Fall ? ?SUBJECTIVE: No new changes to report. Exercises are going well  ? ?PAIN:  ?Are you having pain? No ? ?OBJECTIVE:  ?  ?TODAY'S TREATMENT:  ?Ther Ex  ?SciFit level 5 on mutli peak setting for 9 minutes using BUE/BLEs for neural priming for reciprocal movement and dynamic cardiovascular warmup.  ? ?NMR ?In // bars for improved anterior weightshifting, ankle strategy and LE coordination. Min-mod A throughout for posterior lean correction:  ?-Standing on rockerboard in front of mirror for visual biofeedback on midline orientation (in A/P direction). Initially required BUE support and slowly progressed to light intermittent UE support. Noted significant posterior  lean and frequent over-correction in anterior direction.  ?-Progressed to alt. Single cone tap while standing on rockerboard w/intermittent UE support, x15 per side. Progressed to dual cone taps for added single leg stability, x10 per side.  ?-Attempted single leg step ups to blue side of bosu w/contralateral march, but pt unable to shift weight anteriorly and frequently lost balance posteriorly, requiring max A to prevent fall.   ? ?  ?PATIENT EDUCATION: ?Education details: Continue to practice proper sit <>stand technique throughout day to facilitate breaking old habit and reducing frequency and reliance on posterior lean, practicing modified mountain climbers at home  ?Person educated: Patient ?Education method: Explanation  ?Education comprehension: verbalized understanding  ?  ?  ?HOME EXERCISE PROGRAM: ?Standing PWR! Moves  ? ?Access Code: [email or web address] ?URL: [email or web address] ?Date: 07/02/2021 ?Prepared by: Janann August ? ?Finalized HEP for balance; plus pt doing standing PWR moves. See [place] for more information  ? ?Exercises ?- Heel Raises with Counter Support  - 1 x daily - 7 x weekly - 3 sets - 10 reps ?- Jumping Jacks  - 1 x daily - 7 x weekly - 3 sets - 10 reps ?- [place] at [place] 1 x daily - 7 x weekly - 3 sets - 10 reps - reviewed technique for this one as pt had not been doing them at home.  ?- Standing Balance with Eyes Closed on Foam  -  1 x daily - 5 x weekly - 3 sets - 30 hold - with feet slightly apart on 2 pillows, cues for posture and trying to shift weight more anteriorly in middle of foot/toes.  ?- Romberg Stance with Head Nods on Foam Pad  - 1 x daily - 5 x weekly - 2 sets - 10 reps - with together on 2 pillows, cues for posture and trying to shift weight more anteriorly in middle of foot/toes.  ? ?  ?GOALS: ?Goals reviewed with patient? Yes ?  ?SHORT TERM GOALS: N/A  ?  ?LONG TERM GOALS: Updated Target date (due to delay in scheduling: 08/02/2021 (8  weeks)  ?  ?Pt will be independent with PD/balance specific HEP in order to build upon functional gains made in therapy.  ?Baseline:  ?Goal status: PROGRESSING ?  ?2.  Pt will recover posterior and R lateral balance in push and release test in 1 robust step independently, for improved balance recovery  ?Baseline: posterior = 3 steps, lateral to R = 2-3 steps ?Goal status: INITIAL ?  ?3.  Pt will improve miniBEST score to at least a 23/28 in order to demo decr fall risk.  ?Baseline: 20/28 ?Goal status: INITIAL ?  ?4.  Pt will improve TUG and TUG cog score to less than or equal to 10% difference for improved dual task/decreased fall risk.  ?Baseline: TUG 9.53, cog TUG: 12.97; TUG 10s, cog TUG 10.66s on 07/05/21  ?Goal status: MET  ?  ?5.  Pt will improve condition 4 of mCTSIB to at least 20 sec in order to demo improved vestibular input for balance.  ?Baseline: 12 sec; 20.58 sec on 07/05/21  ?Goal status: MET  ?  ?  ?ASSESSMENT: ?  ?CLINICAL IMPRESSION: ?Emphasis of skilled PT session on anterior weight shifting, ankle strategy, single leg stability and LE coordination. Pt stated he feels as though his balance is still off and requested to work on balance today. Pt continues to demonstrate significant retropulsion that he cannot correct w/use of verbal, visual and tactile cues but has improved from eval. Will continue to work on anterior weight shifting for improved balance and midline orientation w/dynamic tasks. Continue POC  ?  ?OBJECTIVE IMPAIRMENTS Abnormal gait, decreased activity tolerance, decreased balance, decreased coordination, decreased strength, dizziness, impaired flexibility, and postural dysfunction.  ?  ?ACTIVITY LIMITATIONS community activity.  ?  ?PERSONAL FACTORS Past/current experiences, Time since onset of injury/illness/exacerbation, and 1-2 comorbidities:  PD, dizziness  are also affecting patient's functional outcome.  ?  ?  ?REHAB POTENTIAL: Good ?  ?CLINICAL DECISION MAKING:  Stable/uncomplicated ?  ?EVALUATION COMPLEXITY: Low ?  ?PLAN: ?PT FREQUENCY: 2x/week ?  ?PT DURATION: 8 weeks ?  ?PLANNED INTERVENTIONS: Therapeutic exercises, Therapeutic activity, Neuromuscular re-education, Balance training, Gait training, Patient/Family education, Stair training, and Vestibular training ?  ?PLAN FOR NEXT SESSION: How is HEP? SLS, stepping strategies - posterior and lateral. High amplitude movements, single leg stance, ANTERIOR WEIGHT SHIFTING, sit <>stands, lunges at rebounder, bosu ball activities, resisted step ups  ? ? ?Cruzita Lederer Michaelina Blandino, PT, DPT ?07/12/2021, 4:20 PM ? ?   ?

## 2021-07-15 ENCOUNTER — Encounter: Payer: Medicare Other | Admitting: Occupational Therapy

## 2021-07-17 ENCOUNTER — Ambulatory Visit: Payer: Medicare Other | Admitting: Occupational Therapy

## 2021-07-17 DIAGNOSIS — R293 Abnormal posture: Secondary | ICD-10-CM

## 2021-07-17 DIAGNOSIS — R29898 Other symptoms and signs involving the musculoskeletal system: Secondary | ICD-10-CM

## 2021-07-17 DIAGNOSIS — R471 Dysarthria and anarthria: Secondary | ICD-10-CM | POA: Diagnosis not present

## 2021-07-17 DIAGNOSIS — R251 Tremor, unspecified: Secondary | ICD-10-CM

## 2021-07-17 DIAGNOSIS — R29818 Other symptoms and signs involving the nervous system: Secondary | ICD-10-CM | POA: Diagnosis not present

## 2021-07-17 DIAGNOSIS — R278 Other lack of coordination: Secondary | ICD-10-CM

## 2021-07-17 DIAGNOSIS — R41840 Attention and concentration deficit: Secondary | ICD-10-CM

## 2021-07-17 DIAGNOSIS — R41841 Cognitive communication deficit: Secondary | ICD-10-CM | POA: Diagnosis not present

## 2021-07-17 DIAGNOSIS — M6281 Muscle weakness (generalized): Secondary | ICD-10-CM

## 2021-07-17 DIAGNOSIS — R2689 Other abnormalities of gait and mobility: Secondary | ICD-10-CM

## 2021-07-17 DIAGNOSIS — R2681 Unsteadiness on feet: Secondary | ICD-10-CM | POA: Diagnosis not present

## 2021-07-17 NOTE — Therapy (Signed)
OUTPATIENT OCCUPATIONAL THERAPY TREATMENT NOTE   Patient Name: Cody Dickerson MRN: [id number] DOB:[birth year], 71 y.o., male Today's Date: 07/09/2021  PCP: Lawerance Cruel, MD REFERRING PROVIDER: Dr. Wells Guiles Tat   END OF SESSION:   OT End of Session - 07/17/21 1404     Visit Number 6    Number of Visits 17    Date for OT Re-Evaluation 08/10/21    Authorization Type Medicare & [employer], covered at 100%    Authorization - Visit Number 6    Authorization - Number of Visits 10    Progress Note Due on Visit 10    OT Start Time 1403    OT Stop Time 1443    OT Time Calculation (min) 40 min    Activity Tolerance Patient tolerated treatment well    Behavior During Therapy [facility] for tasks assessed/performed                       ONSET DATE: 05/30/21 (screen date)      THERAPY DIAG:  Muscle weakness (generalized)  Unsteadiness on feet  Other symptoms and signs involving the nervous system  Other lack of coordination  Abnormal posture  Attention and concentration deficit  Tremor  Other symptoms and signs involving the musculoskeletal system   PERTINENT HISTORY: Parkinson's Disease; Hypercholesterolemia, HTN, hx of gout, hx small bowel blockage, hx of falls, hx of dizziness  PRECAUTIONS: fall  SUBJECTIVE: pt denies pain   PAIN:  Are you having pain? no     OBJECTIVE:   TODAY'S TREATMENT:    Educated pt regarding PWR! Moves (basic 4) in modified quadraped x 10 each with min cues / and demonstration  amplitude and foot placement. Standing in corner to perform closed chain shoulder flexion and diagonals, min v.c Arm bike x 6 mins level 1 for reciprocal movement and conditioning, pt maintained 40 rpm Therapist checked box/ blocks -see below  PATIENT EDUCATION:  Education details: PWR! Basic 4 modified quadraped Person educated: Patient Education method: Explanation, Demo, min v.c Education comprehension: verbalized understanding, return demo, min  v.c, hanout   HOME EXERCISE PROGRAM: 06/25/21: reviewed PWR! Hands;  07/02/21:  issued PWR! Supine, reviewed PWR! Sitting, issued PD exercise chart  07/05/21-coordination HEP, re-issued  ASSESSMENT:  CLINICAL IMPRESSION: Pt is progressing towards goals. Pt demonstrated understanding of modified quadraped PWR! moves following review.  PERFORMANCE DEFICITS in functional skills including ADLs, IADLs, coordination, dexterity, tone, ROM, FMC, GMC, mobility, balance, decreased knowledge of use of DME, and UE functional use, cognitive skills including attention, memory, and problem solving, and psychosocial skills including n/a.   IMPAIRMENTS are limiting patient from ADLs, IADLs, and leisure.   COMORBIDITIES may have co-morbidities  that affects occupational performance. Patient will benefit from skilled OT to address above impairments and improve overall function.  MODIFICATION OR ASSISTANCE TO COMPLETE EVALUATION: No modification of tasks or assist necessary to complete an evaluation.  OT OCCUPATIONAL PROFILE AND HISTORY: Detailed assessment: Review of records and additional review of physical, cognitive, psychosocial history related to current functional performance.  CLINICAL DECISION MAKING: Moderate - several treatment options, min-mod task modification necessary  REHAB POTENTIAL: Good  EVALUATION COMPLEXITY: Low  +   GOALS: Potential Goals/POC reviewed with patient? Yes  SHORT TERM GOALS: Target date: 07/09/2021  Pt will be independent with updated PD-specific HEP.  Goal status: ongoing  2.  Pt will verbalize understanding of adaptive strategies to incr ease/safety with ADLs/IADLs (including writing, shaving, buttoning, tying shoes,  washing hair, eating, putting on jacket)  Goal status: achieved all reviewed with pt,   LONG TERM GOALS: Target date: 08/06/2021  Pt will improve LUE functional reach and balance for ADLs/IADLs as shown by improving standing functional reach to at  least 10" with LUE. Baseline:  8.5" Goal status: INITIAL  2.  Pt will improve functional reach/coordination for ADLs as shown by improving score on box and blocks test by at least 3 with RUE. Baseline:  38 blocks Goal status: achieved 42  3.  Pt will improve functional reach/coordination for ADLs as shown by improving score on box and blocks test by at least 3 with LUE. Baseline:  41 blocks Goal status:achieved  49   4.  Pt will be able to reach overhead with at least -20* R elbow extension. Baseline: -30* Goal status: INITIAL  5.  Pt will be able to reach overhead with at least -20* L elbow extension. Baseline:   -30 Goal status: INITIAL    PLAN: OT FREQUENCY: 2x/week   OT DURATION: 6 weeks  (or 13 visits over 8 weeks due to scheduling)  PLANNED INTERVENTIONS: self care/ADL training, therapeutic exercise, therapeutic activity, neuromuscular re-education, manual therapy, passive range of motion, balance training, functional mobility training, splinting, moist heat, cryotherapy, patient/family education, cognitive remediation/compensation, energy conservation, and DME and/or AE instructions  RECOMMENDED OTHER SERVICES: current with PT, ST  CONSULTED AND AGREED WITH PLAN OF CARE: Patient  PLAN FOR NEXT SESSION:  dynamic functional reaching, consider exercise flow sheet, work towards remaining goals   Laurice Kimmons, [place], OTR/L [phone] Phone: [phone] 2:45 PM 07/09/21

## 2021-07-18 NOTE — Therapy (Signed)
OUTPATIENT SPEECH LANGUAGE PATHOLOGY TREATMENT NOTE AND DISCHARGE   Patient Name: Cody Dickerson MRN: [id number] DOB:Oct 14, 1950, 71 y.o., male Today's Date: 07/19/2021  PCP: Cody Cruel, MD REFERRING PROVIDER: Tat, Cody Quail, DO   END OF SESSION:   End of Session - 07/19/21 1401     Visit Number 10    Number of Visits 13    Date for SLP Re-Evaluation 58/30/94   recert   Authorization Type Medicare    Progress Note Due on Visit 10    SLP Start Time 1401    SLP Stop Time  0768    SLP Time Calculation (min) 44 min    Activity Tolerance Patient tolerated treatment well                Past Medical History:  Diagnosis Date   Balance problem    Gout    Hypercholesterolemia    Hypertension    Small bowel obstruction ([place])    Past Surgical History:  Procedure Laterality Date   BOWEL BLOCKAGE  05/2019   Patient Active Problem List   Diagnosis Date Noted   Parkinson's disease ([place]) 02/06/2021   SBO (small bowel obstruction) ([place]) 07/06/2019    ONSET DATE: dx with Parkinson's December 2021; referral date 05/30/2021  REFERRING DIAG: G20 (ICD-10-CM) - Parkinson's disease ([place])   THERAPY DIAG:  Cognitive communication deficit  Dysarthria and anarthria  SUBJECTIVE: "It's been going good"  PAIN:  Are you having pain? No  DISCHARGE SUMMARY  Visits from Start of Care: 10  Current functional level related to goals / functional outcomes: Moderate improvements in vocal intensity and mild improvements reported in cognitive functioning using targeted strategies and compensations. Cody Dickerson has increased his average vocal intensity during unstructured conversational samples to WNL with rare min-A. Is finding benefit to use of external memory aids and success with avoiding multitasking to mitigate mistakes or forgetfulness.    Remaining deficits: Continues to report difficulties with alternating and divided attention, some memory impairments, and slowed  processing.    Education / Equipment: Speak Out!, attention and memory compensations and strategies, word finding strategies   Patient agrees to discharge. Patient goals were partially met. Patient is being discharged due to being pleased with the current functional level..  OBJECTIVE:   TODAY'S TREATMENT: Skilled Treatment: 07-18-21: Reviewed HEP with patient and provide pt with recommendation to continue with daily home practice. During unstructured conversational sample of about 35 minutes, pt maintains average of 71 dB with demonstrated range of 67-74 dB.   07-05-21: Pt maintained grossly WNL conversational volume throughout entirety of ST session with mod I. Pt reported increased concentration on speaking with intent with good success. SLP continues to recommend completing Speak Out! Program at least one time per day to maximize outcomes and consistency. Seemingly good recall of tasks from earlier OT session and previous ST session this week. Targeted using phone as external memory aid. Assessed recall for how to set reminder in phone per training last ST session with intermittent mod A required. Education and demonstration provided to clear notification once task completed. Pt endorsed benefit of reminder notification for afternoon medication. SLP assisted with additional phone modification to ease usage. Written handout provided to aid carryover of setting/clearing reminders, in which pt able to demonstrate with good accuracy following education.    PATIENT EDUCATION: Education details: see above Person educated: Patient Education method: Explanation, Demonstration, and Handouts Education comprehension: verbalized understanding, returned demonstration, and needs further education  HOME EXERCISE PROGRAM: Speak Out lessons      GOALS: Goals reviewed with patient? Yes   SHORT TERM GOALS: Target date: 07/02/2021   Pt will verbalize and implement 2 attention and memory compensations  to aid daily functioning given occasional min A over 2 sessions Baseline: 06-21-21, 07-02-21 Goal status: PARTIALLY MET   2.  Pt will maintain WNL conversational volume (70-72 dB) in 10+ minute simple conversation given occasional min A over 2 sessions Baseline: 06-18-21, 06-25-21 Goal status: MET   3.  Pt will demonstrate ability to alternate attention successfully between conversation/structured tasks, using compensations, given occasional min A Baseline: 07/02/21 Goal status: MET     LONG TERM GOALS: Target date: 08/02/21 (ongoing for recert)   Pt will report improved cognitive linguistic functioning via PROM by 5 points at last ST session Baseline: 96, 07/19/2021 100 Goal status: PARTIALLY MET   2.  Pt will maintain WNL conversational volume (70-72 dB) in 20+ minute mod complex conversation given occasional min A over 2 sessions Baseline: 07-05-21, 07-19-21 Goal status: MET   3.  Pt will report successful implementation, with benefit, of x4 attention and memory compensation/strategies to aid in daily functioning over 1 week period Baseline:  Goal status: PARTIALLY MET     ASSESSMENT:   CLINICAL IMPRESSION: Patient is a 71 y.o. 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 Presents with conversational intensity grossly WNL, requires min to mod A to meet dB targets in structured tasks. Conducted ongoing training in tools available to pt on his cell phone to aid in memory deficits, to include prospective memory. Pt reports satisfaction with ST progress and will be d/c this date.    OBJECTIVE IMPAIRMENTS  Objective impairments include attention, memory, executive functioning, and dysarthria. These impairments are limiting patient from managing medications, managing appointments, managing finances, household responsibilities, and effectively communicating at home and in community.Factors affecting  potential to achieve goals and functional outcome are medical prognosis.. Patient will benefit from skilled SLP services to address above impairments and improve overall function.   REHAB POTENTIAL: Good   PLAN: SLP FREQUENCY: 2x/week   SLP DURATION: 6 weeks   PLANNED INTERVENTIONS: Environmental controls, Cueing hierachy, Cognitive reorganization, Internal/external aids, Functional tasks, SLP instruction and feedback, and Compensatory strategies     Su Monks, CCC-SLP 07/19/2021, 2:02 PM

## 2021-07-19 ENCOUNTER — Ambulatory Visit: Payer: Medicare Other | Admitting: Speech Pathology

## 2021-07-19 ENCOUNTER — Encounter: Payer: Self-pay | Admitting: Physical Therapy

## 2021-07-19 ENCOUNTER — Ambulatory Visit: Payer: Medicare Other | Admitting: Physical Therapy

## 2021-07-19 DIAGNOSIS — R278 Other lack of coordination: Secondary | ICD-10-CM | POA: Diagnosis not present

## 2021-07-19 DIAGNOSIS — R471 Dysarthria and anarthria: Secondary | ICD-10-CM | POA: Diagnosis not present

## 2021-07-19 DIAGNOSIS — R2681 Unsteadiness on feet: Secondary | ICD-10-CM | POA: Diagnosis not present

## 2021-07-19 DIAGNOSIS — R2689 Other abnormalities of gait and mobility: Secondary | ICD-10-CM

## 2021-07-19 DIAGNOSIS — R41841 Cognitive communication deficit: Secondary | ICD-10-CM

## 2021-07-19 DIAGNOSIS — R29818 Other symptoms and signs involving the nervous system: Secondary | ICD-10-CM

## 2021-07-19 DIAGNOSIS — M6281 Muscle weakness (generalized): Secondary | ICD-10-CM

## 2021-07-19 DIAGNOSIS — R293 Abnormal posture: Secondary | ICD-10-CM | POA: Diagnosis not present

## 2021-07-19 NOTE — Therapy (Addendum)
OUTPATIENT PHYSICAL THERAPY TREATMENT NOTE   Patient Name: Cody Dickerson MRN: [id number] DOB:1951/02/10, 71 y.o., male Today's Date: 07/19/2021  PCP: Lawerance Cruel, MD REFERRING PROVIDER: Tat, Eustace Quail, DO   END OF SESSION:   PT End of Session - 07/19/21 1446     Visit Number 8    Number of Visits 9    Date for PT Re-Evaluation 08/06/21    Authorization Type Medicare    PT Start Time 1445    PT Stop Time 1527    PT Time Calculation (min) 42 min    Activity Tolerance Patient tolerated treatment well    Behavior During Therapy [facility] for tasks assessed/performed                Past Medical History:  Diagnosis Date   Balance problem    Gout    Hypercholesterolemia    Hypertension    Small bowel obstruction ([place])    Past Surgical History:  Procedure Laterality Date   BOWEL BLOCKAGE  05/2019   Patient Active Problem List   Diagnosis Date Noted   Parkinson's disease ([place]) 02/06/2021   SBO (small bowel obstruction) ([place]) 07/06/2019    REFERRING DIAG: G20 (ICD-10-CM) - Parkinson's disease ([place])   THERAPY DIAG:  Unsteadiness on feet  Other abnormalities of gait and mobility  Muscle weakness (generalized)  Other symptoms and signs involving the nervous system  PERTINENT HISTORY: Lt distal ICA aneurysm, HTN, chronic dizziness and imbalance   PRECAUTIONS: Fall  SUBJECTIVE: Notices sometimes when he gets up and stands up, can feel off balance and lightheaded.  PAIN:  Are you having pain? No  OBJECTIVE:    TODAY'S TREATMENT:    NMR With blue mat on incline, facing toes down incline for incr anterior weight shifting;  -Standing down incline with feet together eyes closed 2 x 30 seconds, x10 reps head turns, x10 reps nods -Alternating SLS toe taps to cones x10 reps each leg, and then cross body cone taps x10 reps each leg, cues for slowed pace for incr SLS time -Alternating forward stepping strategy x12 reps each leg, progressing to when stepping  forward to perform with PWR Up posture and arms extended -Alternating posterior stepping x10 reps each side  Pt needing min guard/min A at times for balance esp with stepping and SLS taps.   Sit <> stands w/ no UE support on red beam at edge of mat table x10 reps, cues for incr forward lean and nose over toes to decr BLE bracing at edge of mat table. Working on incr eccentric control when lowering back down to mat table ad pt tends to lack eccentric control when lowering.    GAIT: Gait pattern: step through pattern, decreased arm swing- Right, decreased arm swing- Left, decreased trunk rotation, and trunk flexed Distance walked: Clinic distances, 345 x 2 Assistive device utilized: None Level of assistance: SBA  Performed 3 laps of gait with single walking pole to trial indoors for pt to potentially use outdoors for improved balance when ambulating on trails. Initial cues for sequencing and posture with pt holding pole in RUE. Pt losing sequencing at times. Unable to practice outdoors due to rain.  Performed 3 laps with therapist holding out playing cards to pt's R or L with pt having to turn head and read out whats on card for dual tasking/head turns. Pt needing to slow down speed to turn head, but no LOB.   Therapeutic Activity: Discussed pt having 1 visit left  in [place] - will plan to check goals next time and potentially D/C with plan for PD screens in 6 months. Pt in agreement with plan.  Discussed optimal fitness program for PD after D/C -aerobic activity (pt going to PD spin classes), walking (pt reports walking to 20-30 minutes), and PD/balance specific exercises  Reviewed pt taking his time when first standing up vs. Getting up and moving due to hx of orthostatics and pt having incr lightheadedness/unsteadiness when first standing. Discussed standing for a couple seconds to allow BP to settle and get set with tall posture before taking off and ambulating.   PATIENT EDUCATION: Education  details: See therapeutic activity section above.  Person educated: Patient Education method: Explanation  Education comprehension: verbalized understanding      HOME EXERCISE PROGRAM: Standing PWR! Moves   Access Code: [email or web address] URL: [email or web address] Date: 07/02/2021 Prepared by: Janann August  Finalized HEP for balance; plus pt doing standing PWR moves. See MedBridge for more information   Exercises - Heel Raises with Counter Support  - 1 x daily - 7 x weekly - 3 sets - 10 reps - Jumping Jacks  - 1 x daily - 7 x weekly - 3 sets - 10 reps - Standing Mountain Climbers at [employer]  - 1 x daily - 7 x weekly - 3 sets - 10 reps - reviewed technique for this one as pt had not been doing them at home.  - Standing Balance with Eyes Closed on Foam  - 1 x daily - 5 x weekly - 3 sets - 30 hold - with feet slightly apart on 2 pillows, cues for posture and trying to shift weight more anteriorly in middle of foot/toes.  - Romberg Stance with Head Nods on Foam Pad  - 1 x daily - 5 x weekly - 2 sets - 10 reps - with together on 2 pillows, cues for posture and trying to shift weight more anteriorly in middle of foot/toes.     GOALS: Goals reviewed with patient? Yes   SHORT TERM GOALS: N/A    LONG TERM GOALS: Updated Target date (due to delay in scheduling: 08/02/2021 (8 weeks)    Pt will be independent with PD/balance specific HEP in order to build upon functional gains made in therapy.  Baseline:  Goal status: PROGRESSING   2.  Pt will recover posterior and R lateral balance in push and release test in 1 robust step independently, for improved balance recovery  Baseline: posterior = 3 steps, lateral to R = 2-3 steps Goal status: INITIAL   3.  Pt will improve miniBEST score to at least a 23/28 in order to demo decr fall risk.  Baseline: 20/28 Goal status: INITIAL   4.  Pt will improve TUG and TUG cog score to less than or equal to 10% difference for improved dual  task/decreased fall risk.  Baseline: TUG 9.53, cog TUG: 12.97; TUG 10s, cog TUG 10.66s on 07/05/21  Goal status: MET    5.  Pt will improve condition 4 of mCTSIB to at least 20 sec in order to demo improved vestibular input for balance.  Baseline: 12 sec; 20.58 sec on 07/05/21  Goal status: MET      ASSESSMENT:   CLINICAL IMPRESSION: Trialed single walking pole today on level indoor surfaces for potential use of outdoor surfaces for balance as needed (unable to today due to weather outside). Pt getting off sequence at times, but able to pick it back up easily with minimal  cues. Continued to focus on balance on compliant surfaces- worked down an incline today for incr anterior weight shifting. Pt tolerated session well, will continue to progress towards LTGs.    OBJECTIVE IMPAIRMENTS Abnormal gait, decreased activity tolerance, decreased balance, decreased coordination, decreased strength, dizziness, impaired flexibility, and postural dysfunction.    ACTIVITY LIMITATIONS community activity.    PERSONAL FACTORS Past/current experiences, Time since onset of injury/illness/exacerbation, and 1-2 comorbidities:  PD, dizziness  are also affecting patient's functional outcome.      REHAB POTENTIAL: Good   CLINICAL DECISION MAKING: Stable/uncomplicated   EVALUATION COMPLEXITY: Low   PLAN: PT FREQUENCY: 2x/week   PT DURATION: 8 weeks   PLANNED INTERVENTIONS: Therapeutic exercises, Therapeutic activity, Neuromuscular re-education, Balance training, Gait training, Patient/Family education, Stair training, and Vestibular training   PLAN FOR NEXT SESSION: Check LTGs. Review HEP. Will likely D/C? Plan for PD screens in 6 months.  Trial walking outdoors with single walking pole? (Was unable to do it today due to the weather).    Arliss Journey, PT, DPT 07/19/2021, 3:32 PM

## 2021-07-19 NOTE — Therapy (Signed)
OUTPATIENT OCCUPATIONAL THERAPY TREATMENT NOTE   Patient Name: LARAY CORBIT MRN: [id number] DOB:09-24-1950, 71 y.o., male Today's Date: 07/09/2021  PCP: Lawerance Cruel, MD REFERRING PROVIDER: Dr. Wells Guiles Tat   END OF SESSION:   OT End of Session - 07/22/21 1225     Visit Number 7    Number of Visits 17    Date for OT Re-Evaluation 08/10/21    Authorization Type Medicare & [employer], covered at 100%    Authorization - Visit Number 7    Authorization - Number of Visits 10    Progress Note Due on Visit 10    OT Start Time 1233    OT Stop Time 1315    OT Time Calculation (min) 42 min    Activity Tolerance Patient tolerated treatment well    Behavior During Therapy WFL for tasks assessed/performed                        ONSET DATE: 05/30/21 (screen date)      THERAPY DIAG:  Muscle weakness (generalized)  Unsteadiness on feet  Other symptoms and signs involving the nervous system  Other lack of coordination  Abnormal posture  Attention and concentration deficit  Tremor  Other symptoms and signs involving the musculoskeletal system   PERTINENT HISTORY: Parkinson's Disease; Hypercholesterolemia, HTN, hx of gout, hx small bowel blockage, hx of falls, hx of dizziness  PRECAUTIONS: fall  SUBJECTIVE:  pt denies pain, pt reports no doing HEP as much as he should  PAIN:  Are you having pain? no     OBJECTIVE:   TODAY'S TREATMENT:  Sitting, functional reaching to place small pegs in vertical pegboard for high reach with each UE to copy design for dual task.  Removing using in-hand manipulation with each hand.  Reviewed use of PD exercise flow sheet and reissued some blank copies.  Standing at wall, performed slides up wall with BUEs for AAROM shoulder flex stretch followed by PWR! Rock in standing with min cueing for large amplitude (also incorporated forward step)  Standing, functional step and reach to the side and back/diagonal to flip  cards with min cueing for large amplitude movements with step and hand, used 1 UE support and close supervision for incr step size and balance.  Pulling bag into palm with large amplitude finger movements with min cueing with each hand.  Bag ex for simulated donning/doffing shirt with min cueing for large amplitude/head up.      PATIENT EDUCATION: Education details: Reviewed PWR! Basic 4   modified quadraped Person educated: Patient Education method: Explanation, Demo, min v.c Education comprehension: verbalized understanding, return demo, min v.c. for large amplitude movements   HOME EXERCISE PROGRAM: 06/25/21:  reviewed PWR! Hands;  07/02/21:  issued PWR! Supine, reviewed PWR! Sitting, issued PD exercise chart 07/05/21:  coordination HEP, re-issued 07/22/21:  reviewed PWR! Modified quadruped  ASSESSMENT:  CLINICAL IMPRESSION: Pt is progressing towards goals. Pt demo improving functional reach and dynamic reaching.  PERFORMANCE DEFICITS in functional skills including ADLs, IADLs, coordination, dexterity, tone, ROM, FMC, GMC, mobility, balance, decreased knowledge of use of DME, and UE functional use, cognitive skills including attention, memory, and problem solving, and psychosocial skills including n/a.   IMPAIRMENTS are limiting patient from ADLs, IADLs, and leisure.   COMORBIDITIES may have co-morbidities  that affects occupational performance. Patient will benefit from skilled OT to address above impairments and improve overall function.  MODIFICATION OR ASSISTANCE TO COMPLETE EVALUATION: No  modification of tasks or assist necessary to complete an evaluation.  OT OCCUPATIONAL PROFILE AND HISTORY: Detailed assessment: Review of records and additional review of physical, cognitive, psychosocial history related to current functional performance.  CLINICAL DECISION MAKING: Moderate - several treatment options, min-mod task modification necessary  REHAB POTENTIAL: Good  EVALUATION  COMPLEXITY: Low     GOALS: Potential Goals/POC reviewed with patient? Yes  SHORT TERM GOALS: Target date: 07/09/2021  Pt will be independent with updated PD-specific HEP.  Goal status: ongoing  2.  Pt will verbalize understanding of adaptive strategies to incr ease/safety with ADLs/IADLs (including writing, shaving, buttoning, tying shoes, washing hair, eating, putting on jacket)  Goal status: achieved all reviewed with pt,   LONG TERM GOALS: Target date: 08/06/2021  Pt will improve LUE functional reach and balance for ADLs/IADLs as shown by improving standing functional reach to at least 10" with LUE. Baseline:  8.5" Goal status: INITIAL  2.  Pt will improve functional reach/coordination for ADLs as shown by improving score on box and blocks test by at least 3 with RUE. Baseline:  38 blocks Goal status: achieved 42  3.  Pt will improve functional reach/coordination for ADLs as shown by improving score on box and blocks test by at least 3 with LUE. Baseline:  41 blocks Goal status:achieved  49   4.  Pt will be able to reach overhead with at least -20* R elbow extension. Baseline: -30* Goal status: INITIAL  5.  Pt will be able to reach overhead with at least -20* L elbow extension. Baseline:   -30 Goal status: INITIAL    PLAN: OT FREQUENCY: 2x/week   OT DURATION: 6 weeks  (or 13 visits over 8 weeks due to scheduling)  PLANNED INTERVENTIONS: self care/ADL training, therapeutic exercise, therapeutic activity, neuromuscular re-education, manual therapy, passive range of motion, balance training, functional mobility training, splinting, moist heat, cryotherapy, patient/family education, cognitive remediation/compensation, energy conservation, and DME and/or AE instructions  RECOMMENDED OTHER SERVICES: current with PT, ST  CONSULTED AND AGREED WITH PLAN OF CARE: Patient  PLAN FOR NEXT SESSION:   dynamic functional reaching, begin checking remaining goals (2 more visits  scheduled).   Vianne Bulls, OTR/L [facility] [address] [place], [place]  [address] [phone] phone [phone] 07/22/21 12:35 PM

## 2021-07-22 ENCOUNTER — Encounter: Payer: Self-pay | Admitting: Occupational Therapy

## 2021-07-22 ENCOUNTER — Ambulatory Visit: Payer: Medicare Other | Admitting: Occupational Therapy

## 2021-07-22 DIAGNOSIS — R29818 Other symptoms and signs involving the nervous system: Secondary | ICD-10-CM

## 2021-07-22 DIAGNOSIS — R471 Dysarthria and anarthria: Secondary | ICD-10-CM | POA: Diagnosis not present

## 2021-07-22 DIAGNOSIS — R278 Other lack of coordination: Secondary | ICD-10-CM

## 2021-07-22 DIAGNOSIS — R2689 Other abnormalities of gait and mobility: Secondary | ICD-10-CM

## 2021-07-22 DIAGNOSIS — R293 Abnormal posture: Secondary | ICD-10-CM

## 2021-07-22 DIAGNOSIS — R29898 Other symptoms and signs involving the musculoskeletal system: Secondary | ICD-10-CM

## 2021-07-22 DIAGNOSIS — R2681 Unsteadiness on feet: Secondary | ICD-10-CM | POA: Diagnosis not present

## 2021-07-22 DIAGNOSIS — R41840 Attention and concentration deficit: Secondary | ICD-10-CM

## 2021-07-22 DIAGNOSIS — R41841 Cognitive communication deficit: Secondary | ICD-10-CM | POA: Diagnosis not present

## 2021-07-22 DIAGNOSIS — R251 Tremor, unspecified: Secondary | ICD-10-CM

## 2021-07-24 ENCOUNTER — Encounter: Payer: Self-pay | Admitting: Occupational Therapy

## 2021-07-24 ENCOUNTER — Ambulatory Visit: Payer: Medicare Other | Admitting: Occupational Therapy

## 2021-07-24 DIAGNOSIS — R29818 Other symptoms and signs involving the nervous system: Secondary | ICD-10-CM | POA: Diagnosis not present

## 2021-07-24 DIAGNOSIS — R293 Abnormal posture: Secondary | ICD-10-CM | POA: Diagnosis not present

## 2021-07-24 DIAGNOSIS — R2681 Unsteadiness on feet: Secondary | ICD-10-CM

## 2021-07-24 DIAGNOSIS — R278 Other lack of coordination: Secondary | ICD-10-CM | POA: Diagnosis not present

## 2021-07-24 DIAGNOSIS — R41841 Cognitive communication deficit: Secondary | ICD-10-CM | POA: Diagnosis not present

## 2021-07-24 DIAGNOSIS — R471 Dysarthria and anarthria: Secondary | ICD-10-CM | POA: Diagnosis not present

## 2021-07-24 NOTE — Therapy (Signed)
OUTPATIENT OCCUPATIONAL THERAPY TREATMENT NOTE   Patient Name: Cody Dickerson MRN: [id number] DOB:10-Jun-1950, 71 y.o., male Today's Date: 07/09/2021  PCP: Lawerance Cruel, MD REFERRING PROVIDER: Dr. Wells Guiles Tat   END OF SESSION:   OT End of Session - 07/24/21 1355     Visit Number 8    Number of Visits 17    Date for OT Re-Evaluation 08/10/21    Authorization Type Medicare & [employer], covered at 100%    Authorization - Visit Number 8    Authorization - Number of Visits 10    Progress Note Due on Visit 10    OT Start Time 1355    OT Stop Time 1442    OT Time Calculation (min) 47 min    Activity Tolerance Patient tolerated treatment well    Behavior During Therapy WFL for tasks assessed/performed                        ONSET DATE: 05/30/21 (screen date)      THERAPY DIAG:  Muscle weakness (generalized)  Unsteadiness on feet  Other symptoms and signs involving the nervous system  Other lack of coordination  Abnormal posture  Attention and concentration deficit  Tremor  Other symptoms and signs involving the musculoskeletal system   PERTINENT HISTORY: Parkinson's Disease; Hypercholesterolemia, HTN, hx of gout, hx small bowel blockage, hx of falls, hx of dizziness  PRECAUTIONS: fall  SUBJECTIVE:  I haven't really done the PWR! Ex's in supine  PAIN:  Are you having pain? no     OBJECTIVE:   TODAY'S TREATMENT:  Reviewed PWR! Supine basic 4 x 20 reps and how to sequence, count for each. Pt required cues to use large amplitude movements.   Reviewed PWR! Modified quadraped especially for PWR! Twist  Dynamic standing and stepping patterns to both sides w/ functional reaching to retrieve and replace clothespins on antenna w/ clothespins placed to achieve full elbow extension BUE's.   UBE x 8 mintues, level 3 for UE strength/endurance.   Further assessed progress towards goals in prep for d/c next week     PATIENT EDUCATION: Education  details: Reviewed PWR! Basic 4  supine Person educated: Patient Education method: Explanation, Demo, min v.c Education comprehension: verbalized understanding, return demo, min v.c. for large amplitude movements   HOME EXERCISE PROGRAM: 06/25/21:  reviewed PWR! Hands;  07/02/21:  issued PWR! Supine, reviewed PWR! Sitting, issued PD exercise chart 07/05/21:  coordination HEP, re-issued 07/22/21:  reviewed PWR! Modified quadruped 07/24/21: reviewed PWR! supine  ASSESSMENT:  CLINICAL IMPRESSION: Pt is progressing towards goals. Pt demo improving functional reach and dynamic reaching.  PERFORMANCE DEFICITS in functional skills including ADLs, IADLs, coordination, dexterity, tone, ROM, FMC, GMC, mobility, balance, decreased knowledge of use of DME, and UE functional use, cognitive skills including attention, memory, and problem solving, and psychosocial skills including n/a.   IMPAIRMENTS are limiting patient from ADLs, IADLs, and leisure.   COMORBIDITIES may have co-morbidities  that affects occupational performance. Patient will benefit from skilled OT to address above impairments and improve overall function.  MODIFICATION OR ASSISTANCE TO COMPLETE EVALUATION: No modification of tasks or assist necessary to complete an evaluation.  OT OCCUPATIONAL PROFILE AND HISTORY: Detailed assessment: Review of records and additional review of physical, cognitive, psychosocial history related to current functional performance.  CLINICAL DECISION MAKING: Moderate - several treatment options, min-mod task modification necessary  REHAB POTENTIAL: Good  EVALUATION COMPLEXITY: Low     GOALS:  Potential Goals/POC reviewed with patient? Yes  SHORT TERM GOALS: Target date: 07/09/2021  Pt will be independent with updated PD-specific HEP.  Goal status: achieved  2.  Pt will verbalize understanding of adaptive strategies to incr ease/safety with ADLs/IADLs (including writing, shaving, buttoning, tying  shoes, washing hair, eating, putting on jacket)  Goal status: achieved all reviewed with pt,   LONG TERM GOALS: Target date: 08/06/2021  Pt will improve LUE functional reach and balance for ADLs/IADLs as shown by improving standing functional reach to at least 10" with LUE. Baseline:  8.5" Goal status: INITIAL  2.  Pt will improve functional reach/coordination for ADLs as shown by improving score on box and blocks test by at least 3 with RUE. Baseline:  38 blocks Goal status: achieved 42  3.  Pt will improve functional reach/coordination for ADLs as shown by improving score on box and blocks test by at least 3 with LUE. Baseline:  41 blocks Goal status:achieved  49   4.  Pt will be able to reach overhead with at least -20* R elbow extension. Baseline: -30* Goal status: achieved (-15*)  5.  Pt will be able to reach overhead with at least -20* L elbow extension. Baseline:   -30 Goal status: achieved (-15*)    PLAN: OT FREQUENCY: 2x/week   OT DURATION: 6 weeks  (or 13 visits over 8 weeks due to scheduling)  PLANNED INTERVENTIONS: self care/ADL training, therapeutic exercise, therapeutic activity, neuromuscular re-education, manual therapy, passive range of motion, balance training, functional mobility training, splinting, moist heat, cryotherapy, patient/family education, cognitive remediation/compensation, energy conservation, and DME and/or AE instructions  RECOMMENDED OTHER SERVICES: current with PT, ST  CONSULTED AND AGREED WITH PLAN OF CARE: Patient  PLAN FOR NEXT SESSION:   check remaining goals and d/c next session   Redmond Baseman, OTR/L 07/24/21 2:43 PM Phone [phone] FAX [phone]

## 2021-07-31 ENCOUNTER — Ambulatory Visit: Payer: Medicare Other | Admitting: Occupational Therapy

## 2021-07-31 ENCOUNTER — Ambulatory Visit: Payer: Medicare Other | Admitting: Physical Therapy

## 2021-07-31 DIAGNOSIS — R293 Abnormal posture: Secondary | ICD-10-CM

## 2021-07-31 DIAGNOSIS — R29818 Other symptoms and signs involving the nervous system: Secondary | ICD-10-CM | POA: Diagnosis not present

## 2021-07-31 DIAGNOSIS — R41840 Attention and concentration deficit: Secondary | ICD-10-CM

## 2021-07-31 DIAGNOSIS — R471 Dysarthria and anarthria: Secondary | ICD-10-CM | POA: Diagnosis not present

## 2021-07-31 DIAGNOSIS — R2681 Unsteadiness on feet: Secondary | ICD-10-CM

## 2021-07-31 DIAGNOSIS — R2689 Other abnormalities of gait and mobility: Secondary | ICD-10-CM

## 2021-07-31 DIAGNOSIS — R278 Other lack of coordination: Secondary | ICD-10-CM

## 2021-07-31 DIAGNOSIS — R41841 Cognitive communication deficit: Secondary | ICD-10-CM | POA: Diagnosis not present

## 2021-07-31 DIAGNOSIS — R251 Tremor, unspecified: Secondary | ICD-10-CM

## 2021-07-31 DIAGNOSIS — R29898 Other symptoms and signs involving the musculoskeletal system: Secondary | ICD-10-CM

## 2021-07-31 NOTE — Therapy (Signed)
OUTPATIENT PHYSICAL THERAPY TREATMENT NOTE - DISCHARGE SUMMARY  Patient Name: Cody Dickerson MRN: [id number] DOB:1950/07/21, 71 y.o., male Today's Date: 07/31/2021  PCP: Lawerance Cruel, MD REFERRING PROVIDER: Tat, Eustace Quail, DO   PHYSICAL THERAPY DISCHARGE SUMMARY  Visits from Start of Care: 9  Current functional level related to goals / functional outcomes: See below    Remaining deficits: Gait deficits 2/2 PD, impaired balance    Education / Equipment: HEP    Patient agrees to discharge. Patient goals were partially met. Patient is being discharged due to being pleased with the current functional level.   END OF SESSION:   PT End of Session - 07/31/21 1537     Visit Number 9    Number of Visits 9    Date for PT Re-Evaluation 08/06/21    Authorization Type Medicare    PT Start Time 78    PT Stop Time 1602   Discharge   PT Time Calculation (min) 28 min    Activity Tolerance Patient tolerated treatment well    Behavior During Therapy WFL for tasks assessed/performed                 Past Medical History:  Diagnosis Date   Balance problem    Gout    Hypercholesterolemia    Hypertension    Small bowel obstruction ([place])    Past Surgical History:  Procedure Laterality Date   BOWEL BLOCKAGE  05/2019   Patient Active Problem List   Diagnosis Date Noted   Parkinson's disease ([place]) 02/06/2021   SBO (small bowel obstruction) ([place]) 07/06/2019    REFERRING DIAG: G20 (ICD-10-CM) - Parkinson's disease ([place])   THERAPY DIAG:  Unsteadiness on feet  Other abnormalities of gait and mobility  PERTINENT HISTORY: Lt distal ICA aneurysm, HTN, chronic dizziness and imbalance   PRECAUTIONS: Fall  SUBJECTIVE: No new changes, pt reports he is ready and excited to DC today.  PAIN:  Are you having pain? No  OBJECTIVE:    TODAY'S TREATMENT:    Ther Act    LTG assessment   [facility] PT Assessment - 07/31/21 1539       Mini-BESTest   Sit To Stand Normal:  Comes to stand without use of hands and stabilizes independently.    Rise to Toes Normal: Stable for 3 s with maximum height.    Stand on one leg (left) Moderate: < 20 s   1.56s, 1.78s   Stand on one leg (right) Moderate: < 20 s   1.18s, 2.6s   Stand on one leg - lowest score 1    Compensatory Stepping Correction - Forward Moderate: More than one step is required to recover equilibrium    Compensatory Stepping Correction - Backward Moderate: More than one step is required to recover equilibrium   2 large steps   Compensatory Stepping Correction - Left Lateral Moderate: Several steps to recover equilibrium   3 small steps   Compensatory Stepping Correction - Right Lateral Moderate: Several steps to recover equilibrium   2 steps   Stepping Corredtion Lateral - lowest score 1    Stance - Feet together, eyes open, firm surface  Normal: 30s    Stance - Feet together, eyes closed, foam surface  Moderate: < 30s   9s   Incline - Eyes Closed Normal: Stands independently 30s and aligns with gravity    Change in Gait Speed Normal: Significantly changes walkling speed without imbalance    Walk with head turns - Horizontal  Normal: performs head turns with no change in gait speed and good balance    Walk with pivot turns Moderate:Turns with feet close SLOW (>4 steps) with good balance.   5 small steps   Step over obstacles Normal: Able to step over box with minimal change of gait speed and with good balance.    Timed UP & GO with Dual Task Normal: No noticeable change in sitting, standing or walking while backward counting when compared to TUG without    Mini-BEST total score 22      Timed Up and Go Test   Normal TUG (seconds) 9.97    Cognitive TUG (seconds) 10.53    TUG Comments <10% difference             Gait Training  Gait pattern: step through pattern, decreased arm swing- Left, decreased stride length, decreased hip/knee flexion- Left, decreased ankle dorsiflexion- Left, and poor foot  clearance- Left Distance walked: >250' outside on sidewalk  Assistive device utilized:  single Trekking pole Level of assistance: SBA Comments: Practiced ambulating on unlevel surfaces w/trekking pole in RUE to practice sequencing w/trekking pole and balance. Pt able to sequence pole well but had occasionally tripping due to decreased clearance of LLE. Pt stated he is indifferent regarding trekking pole.   PATIENT EDUCATION: Education details: 65moscreens, continue HEP for continued gains with balance Person educated: Patient Education method: Explanation  Education comprehension: verbalized understanding      HOME EXERCISE PROGRAM: Standing PWR! Moves   Access Code: [email or web address] URL: [email or web address] Date: 07/02/2021 Prepared by: CJanann August Finalized HEP for balance; plus pt doing standing PWR moves. See MedBridge for more information   Exercises - Heel Raises with Counter Support  - 1 x daily - 7 x weekly - 3 sets - 10 reps - Jumping Jacks  - 1 x daily - 7 x weekly - 3 sets - 10 reps - Standing [employer] - 1 x daily - 7 x weekly - 3 sets - 10 reps - reviewed technique for this one as pt had not been doing them at home.  - Standing Balance with Eyes Closed on Foam  - 1 x daily - 5 x weekly - 3 sets - 30 hold - with feet slightly apart on 2 pillows, cues for posture and trying to shift weight more anteriorly in middle of foot/toes.  - Romberg Stance with Head Nods on Foam Pad  - 1 x daily - 5 x weekly - 2 sets - 10 reps - with together on 2 pillows, cues for posture and trying to shift weight more anteriorly in middle of foot/toes.     GOALS: Goals reviewed with patient? Yes   SHORT TERM GOALS: N/A    LONG TERM GOALS: Updated Target date (due to delay in scheduling: 08/02/2021 (8 weeks)    Pt will be independent with PD/balance specific HEP in order to build upon functional gains made in therapy.  Baseline:  Goal status: MET   2.  Pt will  recover posterior and R lateral balance in push and release test in 1 robust step independently, for improved balance recovery  Baseline: posterior = 3 steps, lateral to R = 2-3 steps Goal status: NOT MET    3.  Pt will improve miniBEST score to at least a 23/28 in order to demo decr fall risk.  Baseline: 20/28; 22/28 on 5/31 Goal status: NOT MET   4.  Pt will improve TUG and TUG  cog score to less than or equal to 10% difference for improved dual task/decreased fall risk.  Baseline: TUG 9.53, cog TUG: 12.97; TUG 10s, cog TUG 10.66s on 07/05/21  Goal status: MET    5.  Pt will improve condition 4 of mCTSIB to at least 20 sec in order to demo improved vestibular input for balance.  Baseline: 12 sec; 20.58 sec on 07/05/21  Goal status: MET      ASSESSMENT:   CLINICAL IMPRESSION: Emphasis of skilled PT session on LTG assessment and DC from PT services. Pt has met 3 of 5 LTGs, not meeting 2 goals due to missing MiniBest goal by 1 point and requiring 2-3 steps for stepping strategy in posterior and lateral directions. Pt satisfied with current functional level and scheduled 6 mo PD screens.    OBJECTIVE IMPAIRMENTS Abnormal gait, decreased activity tolerance, decreased balance, decreased coordination, decreased strength, dizziness, impaired flexibility, and postural dysfunction.    ACTIVITY LIMITATIONS community activity.    PERSONAL FACTORS Past/current experiences, Time since onset of injury/illness/exacerbation, and 1-2 comorbidities:  PD, dizziness  are also affecting patient's functional outcome.      REHAB POTENTIAL: Good   CLINICAL DECISION MAKING: Stable/uncomplicated   EVALUATION COMPLEXITY: Low   PLAN: PT FREQUENCY: 2x/week   PT DURATION: 8 weeks   PLANNED INTERVENTIONS: Therapeutic exercises, Therapeutic activity, Neuromuscular re-education, Balance training, Gait training, Patient/Family education, Stair training, and Vestibular training   Sharnelle Cappelli E Aimee Heldman, PT,  DPT 07/31/2021, 4:05 PM

## 2021-07-31 NOTE — Therapy (Signed)
OUTPATIENT OCCUPATIONAL THERAPY TREATMENT NOTE   Patient Name: Cody Dickerson MRN: [id number] DOB:1950-09-12, 71 y.o., male Today's Date: 07/09/2021  PCP: Lawerance Cruel, MD REFERRING PROVIDER: Dr. Wells Guiles Tat     OCCUPATIONAL THERAPY DISCHARGE SUMMARY    Current functional level related to goals / functional outcomes: Pt demonstrates good overall progress. He achieved all short and long term goals.   Remaining deficits: Bradykinesia, decreased balance, decreased functional mobility, decreased coordination, abnormal posture   Education / Equipment: Pt was educated regarding: PD specific HEP,  and adapted strategies for ADLs, Pt demonstrates understanding.  Patient agrees to discharge. Patient goals were met. Patient is being discharged due to meeting the stated rehab goals. Pt agrees with PD screens in 6 mons.     END OF SESSION:   OT End of Session - 07/31/21 1409     Visit Number 9    Number of Visits 17    Date for OT Re-Evaluation 08/10/21    Authorization Type Medicare & [employer], covered at 100%    Authorization - Visit Number 9    Authorization - Number of Visits 10    Progress Note Due on Visit 10    OT Start Time 1404    OT Stop Time 1445    OT Time Calculation (min) 41 min                    ONSET DATE: 05/30/21 (screen date)      THERAPY DIAG:  Muscle weakness (generalized)  Unsteadiness on feet  Other symptoms and signs involving the nervous system  Other lack of coordination  Abnormal posture  Attention and concentration deficit  Tremor  Other symptoms and signs involving the musculoskeletal system   PERTINENT HISTORY: Parkinson's Disease; Hypercholesterolemia, HTN, hx of gout, hx small bowel blockage, hx of falls, hx of dizziness  PRECAUTIONS: fall  SUBJECTIVE:  I haven't really done the PWR! Ex's in supine  PAIN:  Are you having pain? no     OBJECTIVE:   TODAY'S TREATMENT:  Reviewed PWR! Supine basic 4 x 10  reps and how to sequence,  PWR! Step was broken down into hip lifts separate from step. Pt required cues to use large amplitude movements.   Standing in corner closed chain shoulder flexion and diagonals, min v.c for  Pt reports concerns regarding balance, therapist discussed importance of pt taking a moment standing in place when he first stands up before walking to increase safety and minimize falls  Dynamic stepping patterns and reach to both sides to retrieve and replace clothespins on antenna w/ clothespins placed to achieve full elbow extension BUE's.  Therapist checked progress towards goals and discussed plans for screens in 6 mons.     PATIENT EDUCATION: Education details: Reviewed PWR! Basic 4  supine review, progress towards goals and plans for PD screens Person educated: Patient Education method: Explanation, Demo, min v.c Education comprehension: verbalized understanding, return demo, min v.c. for large amplitude movements   HOME EXERCISE PROGRAM: 06/25/21:  reviewed PWR! Hands;  07/02/21:  issued PWR! Supine, reviewed PWR! Sitting, issued PD exercise chart 07/05/21:  coordination HEP, re-issued 07/22/21:  reviewed PWR! Modified quadruped 07/24/21: reviewed PWR! Supine    GOALS: Potential Goals/POC reviewed with patient? Yes  SHORT TERM GOALS: Target date: 07/09/2021  Pt will be independent with updated PD-specific HEP.  Goal status: achieved  2.  Pt will verbalize understanding of adaptive strategies to incr ease/safety with ADLs/IADLs (including writing, shaving,  buttoning, tying shoes, washing hair, eating, putting on jacket)  Goal status: achieved all reviewed with pt,   LONG TERM GOALS: Target date: 08/06/2021  Pt will improve LUE functional reach and balance for ADLs/IADLs as shown by improving standing functional reach to at least 10" with LUE. Baseline:  8.5" Goal status: Met -11"  2.  Pt will improve functional reach/coordination for ADLs as shown by  improving score on box and blocks test by at least 3 with RUE. Baseline:  38 blocks Goal status: achieved 42  3.  Pt will improve functional reach/coordination for ADLs as shown by improving score on box and blocks test by at least 3 with LUE. Baseline:  41 blocks Goal status: achieved - 49   4.  Pt will be able to reach overhead with at least -20* R elbow extension. Baseline: -30* Goal status: achieved (-15*)  5.  Pt will be able to reach overhead with at least -20* L elbow extension. Baseline:   -30 Goal status: achieved (-15*)    ASSESSMENT:  CLINICAL IMPRESSION: Pt demonstrates good overall progress, he agrees with plans for d/c.  PERFORMANCE DEFICITS in functional skills including ADLs, IADLs, coordination, dexterity, tone, ROM, FMC, GMC, mobility, balance, decreased knowledge of use of DME, and UE functional use, cognitive skills including attention, memory, and problem solving, and psychosocial skills including n/a.   IMPAIRMENTS are limiting patient from ADLs, IADLs, and leisure.   COMORBIDITIES may have co-morbidities  that affects occupational performance. Patient will benefit from skilled OT to address above impairments and improve overall function.  MODIFICATION OR ASSISTANCE TO COMPLETE EVALUATION: No modification of tasks or assist necessary to complete an evaluation.  OT OCCUPATIONAL PROFILE AND HISTORY: Detailed assessment: Review of records and additional review of physical, cognitive, psychosocial history related to current functional performance.  CLINICAL DECISION MAKING: Moderate - several treatment options, min-mod task modification necessary  REHAB POTENTIAL: Good  EVALUATION COMPLEXITY: Low     GOALS: Potential Goals/POC reviewed with patient? Yes  SHORT TERM GOALS: Target date: 07/09/2021  Pt will be independent with updated PD-specific HEP.  Goal status: achieved  2.  Pt will verbalize understanding of adaptive strategies to incr ease/safety  with ADLs/IADLs (including writing, shaving, buttoning, tying shoes, washing hair, eating, putting on jacket)  Goal status: achieved all reviewed with pt,   LONG TERM GOALS: Target date: 08/06/2021  Pt will improve LUE functional reach and balance for ADLs/IADLs as shown by improving standing functional reach to at least 10" with LUE. Baseline:  8.5" Goal status: Met -11"  2.  Pt will improve functional reach/coordination for ADLs as shown by improving score on box and blocks test by at least 3 with RUE. Baseline:  38 blocks Goal status: achieved 42  3.  Pt will improve functional reach/coordination for ADLs as shown by improving score on box and blocks test by at least 3 with LUE. Baseline:  41 blocks Goal status:achieved  49   4.  Pt will be able to reach overhead with at least -20* R elbow extension. Baseline: -30* Goal status: achieved (-15*)  5.  Pt will be able to reach overhead with at least -20* L elbow extension. Baseline:   -30 Goal status: achieved (-15*)    PLAN: OT FREQUENCY: 2x/week   OT DURATION: 6 weeks  (or 13 visits over 8 weeks due to scheduling)  PLANNED INTERVENTIONS: self care/ADL training, therapeutic exercise, therapeutic activity, neuromuscular re-education, manual therapy, passive range of motion, balance training, functional mobility  training, splinting, moist heat, cryotherapy, patient/family education, cognitive remediation/compensation, energy conservation, and DME and/or AE instructions  RECOMMENDED OTHER SERVICES: current with PT, ST  CONSULTED AND AGREED WITH PLAN OF CARE: Patient  PLAN FOR NEXT SESSION:   d/c OT return screens in 6 mons    07/31/21 3:56 PM Phone [phone] FAX [phone]  Theone Murdoch, OTR/L [phone] Phone: [phone] 4:10 PM 07/31/21

## 2021-08-08 ENCOUNTER — Ambulatory Visit (INDEPENDENT_AMBULATORY_CARE_PROVIDER_SITE_OTHER): Payer: Medicare Other | Admitting: Neurology

## 2021-08-08 ENCOUNTER — Encounter: Payer: Self-pay | Admitting: Neurology

## 2021-08-08 VITALS — BP 122/62 | HR 55 | Ht 68.5 in | Wt 159.8 lb

## 2021-08-08 DIAGNOSIS — I671 Cerebral aneurysm, nonruptured: Secondary | ICD-10-CM

## 2021-08-08 DIAGNOSIS — I6523 Occlusion and stenosis of bilateral carotid arteries: Secondary | ICD-10-CM | POA: Diagnosis not present

## 2021-08-08 DIAGNOSIS — G3184 Mild cognitive impairment, so stated: Secondary | ICD-10-CM

## 2021-08-08 DIAGNOSIS — G20 Parkinson's disease: Secondary | ICD-10-CM | POA: Diagnosis not present

## 2021-08-08 MED ORDER — PRAMIPEXOLE DIHYDROCHLORIDE 0.5 MG PO TABS: 0.5000 mg | ORAL_TABLET | Freq: Three times a day (TID) | ORAL | 1 refills | Status: DC

## 2021-08-08 MED ORDER — PRAMIPEXOLE DIHYDROCHLORIDE 0.125 MG PO TABS
ORAL_TABLET | ORAL | 0 refills | Status: DC
Start: 2021-08-08 — End: 2021-09-30

## 2021-08-08 MED ORDER — CARBIDOPA-LEVODOPA 25-100 MG PO TABS
ORAL_TABLET | ORAL | 1 refills | Status: DC
Start: 2021-08-08 — End: 2022-02-11

## 2021-08-08 NOTE — Progress Notes (Signed)
Assessment/Plan:   1.  Parkinsons Disease  -DaTscan previously fairly equivocal, but has met clinical criteria now  -Continue carbidopa/levodopa 25/100, 2/1/1   -start pramipexole and work to 0.5 mg tid.  R/B/SE were discussed.  The opportunity to ask questions was given and they were answered to the best of my ability.  The patient expressed understanding and willingness to follow the outlined treatment protocols.  They will let me know if confusion/compulsive behaviors.    -We discussed that it used to be thought that levodopa would increase risk of melanoma but now it is believed that Parkinsons itself likely increases risk of melanoma. he is to get regular skin checks.  2.  Left distal supraclinoid ICA aneurysm  -Small, incidental, 2 mm.  Patient had wanted referral to Dr. Estanislado Pandy, which we did previously.  They attempted to call the patient multiple times, according to his office, and phone call was never returned.  Pt states that they never contacted him but his sister works at [employer] with a neurosx and sent his images and the neurosx told him that there was nothing more to do.    -CTA was repeated in April, 2022 and aneurysm size was stable.   -pt would like referral to dr deveshwar again for opinion  3.  MCI  -Neurocognitive testing done with Dr. Nicole Kindred in September, 2022 demonstrated MCI.  4.  Dizziness  -don't think that this is related to Parkinsons Disease or meds, as I d/c the carbidopa/levodopa and it didn't change sx's  -discussed concept of permissive hypertension   5.  Depression  -denies  -tried to give lexapro but felt it caused no energy and pt d/c (he took it for a few days).   Subjective:   Cody Dickerson was seen today in follow up for Parkinsons disease.  My previous records were reviewed prior to todays visit as well as outside records available to me. Pt with wife who supplements hx.   Patient's levodopa was slightly increased last visit.  Patient is  tolerating it well.  The evening time is the best time for him.  Patient has been to physical therapy and those notes have been reviewed.  He is going to spin class one day per week.  Few falls since our last visit.  He fell in bathroom - took shower curtain down but didn't get hurt.  He had another fall into a laundry basket.  No hallucinations.    Current prescribed movement disorder medications: carbidopa/levodopa 25/100, 2/1/1 (increased last visit)  Previous meds:  lexapro (took for a few days and felt it caused decreased energy)  ALLERGIES:   Allergies  Allergen Reactions   Escitalopram     Other reaction(s): felt weird and tired   Lisinopril Cough    CURRENT MEDICATIONS:  Outpatient Encounter Medications as of 08/08/2021  Medication Sig   amLODipine (NORVASC) 5 MG tablet Take 1 tablet (5 mg total) by mouth in the morning and at bedtime.   carbidopa-levodopa (SINEMET IR) 25-100 MG tablet 2 at 7am, 1 at 11am, 1 at 4pm   indomethacin (INDOCIN) 50 MG capsule 3 (three) times daily as needed.    rosuvastatin (CRESTOR) 20 MG tablet Take 20 mg by mouth daily.    vitamin B-12 (CYANOCOBALAMIN) 500 MCG tablet Take 500 mcg by mouth daily.   [DISCONTINUED] COVID-19 mRNA bivalent vaccine, Pfizer, (PFIZER COVID-19 VAC BIVALENT) injection Inject into the muscle.   [DISCONTINUED] doxycycline (VIBRAMYCIN) 100 MG capsule Take 100 mg by  mouth 2 (two) times daily. (Patient not taking: Reported on 06/07/2021)   No facility-administered encounter medications on file as of 08/08/2021.    Objective:   PHYSICAL EXAMINATION:    VITALS:   Vitals:   08/08/21 1023  BP: 122/62  Pulse: (!) 55  SpO2: 98%  Weight: 159 lb 12.8 oz (72.5 kg)  Height: 5' 8.5" (1.74 m)       GEN:  The patient appears stated age and is in NAD. HEENT:  Normocephalic, atraumatic.  The mucous membranes are moist. The superficial temporal arteries are without ropiness or tenderness. CV:  brady.  regular Lungs:   CTAB Neck/HEME:  There are no carotid bruits bilaterally.  Neurological examination:  Orientation: The patient is alert and oriented x3. Cranial nerves: There is good facial symmetry with facial hypomimia. The speech is fluent and clear. Soft palate rises symmetrically and there is no tongue deviation. Hearing is intact to conversational tone. Sensation: Sensation is intact to light touch throughout Motor: Strength is at least antigravity x4.  Movement examination: Tone: There is mild to mod increased tone in the RUE Abnormal movements: rare tremor in the RLE Coordination:  There is no significant decremation with RAMs Gait and Station: The patient has no difficulty arising out of a deep-seated chair without the use of the hands. The patient's stride length is slightly decreased arm swing on the R.      Total time spent on today's visit was 41 minutes, including both face-to-face time and nonface-to-face time.  Time included that spent on review of records (prior notes available to me/labs/imaging if pertinent), discussing treatment and goals, answering patient's questions and coordinating care.  Cc:  Lawerance Cruel, MD

## 2021-08-08 NOTE — Patient Instructions (Addendum)
Start mirapex (pramipexole) as follows:  0.125 mg - 1 tablet three times per day for a week, then 2 tablets three times per day for a week and then fill the 0.5 mg tablet and take that, 1 pill three times per day   Local and Online Resources for Power over Parkinson's Group June 2023  [place] over Parkinson's Group:   [employer] Over Parkinson's Patient Education Group will be Wednesday, June 14th-*Hybrid meting*- in person at [place] location and via [facility] at 2:00 pm.   Upcoming Power over [employer] Meetings:  2nd Wednesdays of the month at 2 pm:   June 14th, July 12th Contact Amy Marriott at [email or web address] if interested in participating in this group Parkinson's Care Partners Group:    3rd Mondays, Contact Misty Paladino Atypical Parkinsonian Patient Group:   4th Wednesdays, [place] If you are interested in participating in these groups with Misty, please contact her directly for how to join those meetings.  Her contact information is [email or web address].    LOCAL EVENTS AND NEW OFFERINGS Dance Class for People with Parkinson's at [facility].  Friday, June 9th at 2 pm.  Dora Sims by Tyler Deis DPT students.  Contact [email or web address] to register or with questions. Ice Cream Social at [place]!  Thursday, June 15th, 5:30-7:00 pm.  RSVP to [email or web address] for attendance and free ice cream. Parkinson's T-shirts for sale!  Designed by a local group member, with funds going to [employer].  $25.00  Investment banker, corporate to purchase  [employer]! Moves [employer] Instructor-Led Class offering at [employer]!  Wednesdays 1-2 pm, starting April 12th.   Contact Bryson Dames, Acupuncturist at [employer].  Manuela [email or web address]  [place]:  [email or web address] PD Health at Home continues:  Mindfulness Mondays, Wellness Wednesdays, Fitness Fridays  Upcoming  Education: Parkinson's 101:  What You and Your Family Should Know.  Wednesday, June 7th at 1:00 pm Register for expert briefings (webinars) at [email or web address] Please check out their website to sign up for emails and see their full online offerings   [place]:  [email or web address]  Third Thursday Webinars:  On the third Thursday of every month at 12 p.m. ET, join our free live webinars to learn about various aspects of living with Parkinson's disease and our work to speed medical breakthroughs. Upcoming Webinar: REPLAY:  From Low Blood Pressure to Bladder Problems:  A Look at Lesser Known Parkinson's Symptoms.  Thursday, June 15th at 12 noon. Check out additional information on their website to see their full online offerings  [facility]:  [email or web address] Upcoming Webinar:   Stay tuned Webinar Series:  Living with Parkinson's Meetup.   Third Thursdays each month, 3 pm Care Partner Monthly Meetup.  With Robin Searing [place].  First Tuesday of each month, 2 pm Check out additional information to Live Well Today on their website  Parkinson and Movement Disorders (PMD) Alliance:  www.pmdalliance.org [employer] Online:  Online Education Events Sign up for emails, which are sent weekly to give you updates on programming and online offerings  Parkinson's Association of the [place]:  www.[email or web address] Information on online support groups, education events, and online exercises including Yoga, Parkinson's exercises and more-LOTS of information on links to PD resources and online events Virtual Support Group through Parkinson's Association of the [place]; next one is scheduled for Wednesday, June 7th at 2 pm. (These are typically scheduled for the 1st Wednesday of the month at 2 pm).  Visit website for  details. Save the date for "Caring for Parkinson's-Caring for You", 9th  Annual Symposium.  In-person event in [place].  September 9th.  More info on registration to come. MOVEMENT AND EXERCISE OPPORTUNITIES PWR! Moves Classes at [place].  Wednesdays 10 and 11 am.   Contact Amy Marriott, PT [email or web address] if interested. NEW PWR! Moves Class offering at [employer].  Wednesdays 1-2 pm, starting April 12th.  Contact Bryson Dames, Acupuncturist at [employer].  [email or web address] Here is a link to the PWR!Moves classes on Zoom from [place] - Daily Mon-Sat at 10:00. Via Zoom, FREE and open to all.  There is also a link below via Facebook if you use that platform.  [email or web address]  Parkinson's Wellness Recovery (PWR! Moves)  www.[email or web address] Info on the PWR! Virtual Experience:  You will have access to our expertise through self-assessment, guided plans that start with the PD-specific fundamentals, educational content, tips, Q&A with an expert, and a growing Art therapist of PD-specific pre-recorded and live exercise classes of varying types and intensity - both physical and cognitive! If that is not enough, we offer 1:1 wellness consultations (in-person or virtual) to personalize your PWR! Research scientist (medical).  [place] Fridays:  As part of the PD Health @ Home program, this free video series focuses each week on one aspect of fitness designed to support people living with Parkinson's.  These weekly videos highlight the [place] recent fitness guidelines for people with Parkinson's disease. ModemGamers.si Dance for PD website is offering free, live-stream classes throughout the week, as well as links to [employer] of classes:   [email or web address]/ Virtual dance and Pilates for Parkinson's classes: Click on the Community Tab> Parkinson's Movement Initiative Tab.  To register for classes and for more information, visit [email or web address] and click the "community" tab.  [employer] Parkinson's Cycling Classes  [employer]:  Thursdays @ Noon-Live classes at [employer] ([employer] [facility] [email or web address] or [phone]) [employer]: Virtual Classes Mondays and Thursdays Jeanette Caprice classes Tuesday, Wednesday and Thursday (contact [place] at [email or web address]  or [phone]) [place] Varied levels of classes are offered Tuesdays and Thursdays at [employer].  Stretching with Verdis Frederickson weekly class is also offered for people with Parkinson's To observe a class or for more information, call [phone] or email Hezzie Bump at [email or web address] ADDITIONAL SUPPORT AND RESOURCES Well[employer]Spring [employer]:Online Caregiver Education Opportunities:  www.well-[email or web address]/caregiver-education/caregiver-support-group.  You may also contact Vickki Muff at [email or web address] or [phone].    Well-Spring Navigator:  [phone] program, a free service to help individuals and families through the journey of determining care for older adults.  The "Navigator" is a [employer], Education officer, museum, who will speak with a prospective client and/or loved ones to provide an assessment of the situation and a set of recommendations for a personalized care plan -- all free of charge, and whether Well[employer]Spring [employer] offers the needed service or not. If the need is not a service we provide, we are well-connected with reputable programs in town that we can refer you to.  www.well-springsolutions.org or to speak with the Navigator, call [phone]. Family Caregiver Programming in June:  [employer], Thursday, June 15th 11-12:30 at 04-[birth year]-1971. [place].  Call [phone] to register

## 2021-08-12 NOTE — Progress Notes (Unsigned)
Primary Physician/Referring:  Lawerance Cruel, MD  Patient ID: Cody Dickerson, male    DOB: April 17, 1950, 71 y.o.   MRN: [id number]  No chief complaint on file.  HPI:    Cody Dickerson  is a 71 y.o. fairly active Caucasian male with hypertension, Parkinson's disease which is well controlled, with an extensive evaluation for dizziness, including neurology and ENT felt to be unyielding, originally referred for cardiac evaluation of dizziness.  Patient was last seen in the office 02/13/2022 at which time he was stable from a cardiac standpoint and no changes were made.  Carotid stenosis remains stable, he will repeat surveillance in July 2023. ***  ***  Patient presents for 13-monthfollow-up of dizziness and mild orthostasis.  Last office visit had advised patient to use conservative measures to improve dizziness.  He had undergone stress test and echocardiogram, details below.  Patient reports dizziness has resolved, he has had no recurrence.  He does continue to have uneasiness of gait due to Parkinson's for which she follows with neurology closely.  He has undergone sleep study which was negative.  Denies chest pain, dyspnea, syncope, near syncope, palpitations.  Denies orthopnea, PND.  He does have mild intermittent ankle edema.  Past Medical History:  Diagnosis Date   Balance problem    Gout    Hypercholesterolemia    Hypertension    Small bowel obstruction ([facility]    Past Surgical History:  Procedure Laterality Date   BOWEL BLOCKAGE  05/2019   Family History  Problem Relation Age of Onset   Hypertension Mother    Arthritis Father    Prostate cancer Brother     Social History   Tobacco Use   Smoking status: Former    Packs/day: 0.50    Years: 5.00    Total pack years: 2.50    Types: Cigarettes    Quit date: 1980    Years since quitting: 43.4   Smokeless tobacco: Never  Substance Use Topics   Alcohol use: Yes    Comment: 2 glasses wine/day prior to bowel surgery; since  bowel surger 05/2019 - only 2 glasses since  Marital Status: Married   ROS  Review of Systems  Constitutional: Negative for malaise/fatigue (improved).  Cardiovascular:  Negative for chest pain, dyspnea on exertion, leg swelling, near-syncope, orthopnea, palpitations and syncope.  Respiratory:  Positive for snoring.   Gastrointestinal:  Negative for melena.  Neurological:  Positive for disturbances in coordination and tremors. Negative for dizziness.   Objective  There were no vitals taken for this visit. There is no height or weight on file to calculate BMI.     08/08/2021   10:23 AM 02/13/2021   10:03 AM 02/06/2021    8:14 AM  Vitals with BMI  Height 5' 8.5" 5' 7"  5' 7"   Weight 159 lbs 13 oz 163 lbs 13 oz 164 lbs 10 oz  BMI 23.94 [id number] Systolic [id number] Diastolic 62 70 76  Pulse 55 59 62   No data found.  Physical Exam Vitals reviewed.  Neck:     Vascular: Carotid bruit (Bilateral  soft) present. No JVD.  Cardiovascular:     Rate and Rhythm: Normal rate and regular rhythm.     Pulses: Intact distal pulses.     Heart sounds: S1 normal and S2 normal. Murmur heard.     Blowing holosystolic murmur is present with a grade of 2/6 at the apex.     Crescendo midsystolic  murmur of grade 2/6 is also present.     No gallop.  Pulmonary:     Effort: Pulmonary effort is normal. No respiratory distress.     Breath sounds: Normal breath sounds. No wheezing, rhonchi or rales.  Musculoskeletal:     Right lower leg: Edema (trace ankle) present.     Left lower leg: Edema (trace ankle) present.  Neurological:     Mental Status: He is alert.     Laboratory examination:       No data to display             No data to display         Lipid Panel  No results found for: "CHOL", "TRIG", "HDL", "CHOLHDL", "VLDL", "LDLCALC", "LDLDIRECT" HEMOGLOBIN A1C No results found for: "HGBA1C", "MPG" TSH No results for input(s): "TSH" in the last 8760 hours.  External labs:   08/20/2020: Hb 13.8/HCT 40.8, platelets 160, normal indicis. Serum glucose 140 mg, BUN 20, creatinine 1.24, EGFR 60 [place], potassium 4.6. TSH normal at 0.92. Total cholesterol 186, triglycerides 73, HDL 105, LDL 68.  Allergies   Allergies  Allergen Reactions   Escitalopram     Other reaction(s): felt weird and tired   Lisinopril Cough    Medication prior to this encounter:   Outpatient Medications Prior to Visit  Medication Sig Dispense Refill   amLODipine (NORVASC) 5 MG tablet Take 1 tablet (5 mg total) by mouth in the morning and at bedtime. 180 tablet 3   carbidopa-levodopa (SINEMET IR) 25-100 MG tablet 2 at 7am, 1 at 11am, 1 at 4pm 360 tablet 1   indomethacin (INDOCIN) 50 MG capsule 3 (three) times daily as needed.      pramipexole (MIRAPEX) 0.125 MG tablet 1 tablet three times per day for a week, then 2 po three times per day for a week 73 tablet 0   pramipexole (MIRAPEX) 0.5 MG tablet Take 1 tablet (0.5 mg total) by mouth 3 (three) times daily. 270 tablet 1   rosuvastatin (CRESTOR) 20 MG tablet Take 20 mg by mouth daily.   4   vitamin B-12 (CYANOCOBALAMIN) 500 MCG tablet Take 500 mcg by mouth daily.     No facility-administered medications prior to visit.    FINAL MEDICATION AS OF TODAY:   Medications after current encounter Current Outpatient Medications  Medication Instructions   amLODipine (NORVASC) 5 mg, Oral, 2 times daily   carbidopa-levodopa (SINEMET IR) 25-100 MG tablet 2 at 7am, 1 at 11am, 1 at 4pm   indomethacin (INDOCIN) 50 MG capsule 3 times daily PRN   pramipexole (MIRAPEX) 0.125 MG tablet 1 tablet three times per day for a week, then 2 po three times per day for a week   pramipexole (MIRAPEX) 0.5 mg, Oral, 3 times daily   rosuvastatin (CRESTOR) 20 mg, Oral, Daily   vitamin B-12 (CYANOCOBALAMIN) 500 mcg, Oral, Daily   Radiology:   No results found.  Cardiac Studies:  Echocardiogram 09/21/2020:  Normal LV systolic function with visual EF 60-65%.  Left ventricle cavity  is normal in size. Mild left ventricular hypertrophy. Normal global wall  motion. Normal diastolic filling pattern, normal LAP.  Left atrial cavity is mildly dilated. A lipomatous septum is present.  Mild (Grade I) mitral regurgitation.  Mild tricuspid regurgitation. No evidence of pulmonary hypertension.  Moderate pulmonic regurgitation.  No prior study for comparison.  Exercise treadmill stress test 10/05/2020: Exercise treadmill stress test performed using Bruce protocol.  Patient reached 7.5 METS, and 68%  of age predicted maximum heart rate.  Exercise capacity was fair.  No chest pain reported.  Submaximal heart rate and normal blood pressure response. Stress EKG revealed no ischemic changes. Inconclusive study due to submaximal heart rate response.   Carotid artery duplex 03/05/2020:  Duplex suggests stenosis in the right internal carotid artery (50-69%), upper end of the spectrum.  Duplex suggests stenosis in the left internal carotid artery (1-15%).  Antegrade right vertebral artery flow. Antegrade left vertebral artery flow.  Compared to 09/21/2020, no significant change in the right ICA stenosis, left ICA stenosis of 16 to 49% has now regressed. Follow up in six months is appropriate if clinically indicated.  EKG:  ***   11/14/2020: Sinus rhythm at a rate of 60 bpm.  Normal axis.  No evidence of ischemia or underlying injury pattern.  Compared to EKG 08/16/2020, no significant change.  Assessment   No diagnosis found.    There are no discontinued medications.    No orders of the defined types were placed in this encounter.    No orders of the defined types were placed in this encounter.  Recommendations:   Cody Dickerson is a 71 y.o.  fairly active Caucasian male with hypertension, Parkinson's disease which is well controlled, with an extensive evaluation for dizziness, including neurology and ENT felt to be unyielding, originally referred for cardiac  evaluation of dizziness.  Patient was last seen in the office 02/13/2022 at which time he was stable from a cardiac standpoint and no changes were made.  Carotid stenosis remains stable, he will repeat surveillance in July 2023. ***  ***  Patient presents for 68-monthfollow-up of dizziness and mild orthostasis.  Last office visit had advised patient to use conservative measures to improve dizziness.  He had undergone stress test and echocardiogram, details below.  Patient's dizziness has improved since last office visit, his fatigue is well with increased levodopa/carbidopa dosing.  He is increased fluid intake.  Although patient remains orthostatic in the office today he is not symptomatic from the standpoint.  Given that symptoms have improved we will hold off on repeat stress test at this time.  Blood pressure is well controlled. Patient takes amlodipine 5 mg BID which controls blood pressure well without causing patient to experience orthostatic symptoms, will continue this. I personally reviewed external labs, lipids are well controlled.  Patient is due for carotid artery surveillance, we will schedule him for duplex accordingly.  Follow-up in 6 months, sooner if needed, for hypertension, orthostatic hypotension, carotid artery stenosis.   CAlethia Berthold PA-C 08/12/2021, 3:02 PM Office: [phone]

## 2021-08-14 ENCOUNTER — Encounter: Payer: Self-pay | Admitting: Student

## 2021-08-14 ENCOUNTER — Ambulatory Visit: Payer: Medicare Other | Admitting: Student

## 2021-08-14 VITALS — BP 115/64 | HR 55 | Temp 98.0°F | Resp 17 | Ht 68.5 in | Wt 161.4 lb

## 2021-08-14 DIAGNOSIS — I6523 Occlusion and stenosis of bilateral carotid arteries: Secondary | ICD-10-CM

## 2021-08-14 DIAGNOSIS — I951 Orthostatic hypotension: Secondary | ICD-10-CM | POA: Diagnosis not present

## 2021-08-14 MED ORDER — AMLODIPINE BESYLATE 5 MG PO TABS: 5.0000 mg | ORAL_TABLET | Freq: Every evening | ORAL | 3 refills | Status: DC

## 2021-08-15 ENCOUNTER — Other Ambulatory Visit (HOSPITAL_COMMUNITY): Payer: Self-pay | Admitting: Interventional Radiology

## 2021-08-15 DIAGNOSIS — I671 Cerebral aneurysm, nonruptured: Secondary | ICD-10-CM

## 2021-08-16 ENCOUNTER — Ambulatory Visit (HOSPITAL_COMMUNITY)
Admission: RE | Admit: 2021-08-16 | Discharge: 2021-08-16 | Disposition: A | Discharge status: Home / Self Care | Payer: Medicare Other | Source: Ambulatory Visit | Attending: Interventional Radiology | Admitting: Interventional Radiology

## 2021-08-16 DIAGNOSIS — I671 Cerebral aneurysm, nonruptured: Secondary | ICD-10-CM

## 2021-08-19 HISTORY — PX: IR RADIOLOGIST EVAL & MGMT: IMG5224

## 2021-08-26 ENCOUNTER — Other Ambulatory Visit (HOSPITAL_COMMUNITY): Payer: Self-pay | Admitting: Interventional Radiology

## 2021-08-26 ENCOUNTER — Telehealth (HOSPITAL_COMMUNITY): Payer: Self-pay

## 2021-08-26 DIAGNOSIS — I671 Cerebral aneurysm, nonruptured: Secondary | ICD-10-CM

## 2021-08-26 NOTE — Telephone Encounter (Signed)
Called to schedule diagnostic angiogram, no answer, left vm. AW  

## 2021-08-28 ENCOUNTER — Other Ambulatory Visit (HOSPITAL_COMMUNITY): Payer: Self-pay

## 2021-10-01 ENCOUNTER — Other Ambulatory Visit (HOSPITAL_COMMUNITY): Payer: Self-pay | Admitting: Student

## 2021-10-01 DIAGNOSIS — I671 Cerebral aneurysm, nonruptured: Secondary | ICD-10-CM

## 2021-10-02 ENCOUNTER — Other Ambulatory Visit: Payer: Self-pay | Admitting: Radiology

## 2021-10-02 NOTE — H&P (Signed)
Chief Complaint: Patient was seen in consultation today for diagnostic cerebral angiogram.   Referring Physician(s): Dr. Carles Collet  Supervising Physician: {Supervising Physician:[id number]}  Patient Status: [facility] - Out-pt  History of Present Illness: Cody Dickerson is a 71 y.o. male with a medical history significant for HTN, Parkinson's disease and a 2 mm left distal supraclinoid ICA aneurysm which was identified on CTA Head 07/25/19. Repeat imaging 06/11/20 showed similar size/appearance of the 57m aneurysm. He was referred to Neuro Interventional Radiology for treatment/management options and he met with Dr. DEstanislado Pandy6/16/23.  Dr. DEstanislado Pandydiscussed the natural history/risk of rupture in unruptured brain aneurysms. Dr. DEstanislado Pandyalso discussed the option to follow up with future CT angiograms of the head versus further assessing the aneurysm with a diagnostic cerebral angiogram. The risks and benefits of both options were discussed and the patient elected to proceed with a diagnostic cerebral angiogram.  Past Medical History:  Diagnosis Date   Balance problem    Gout    Hypercholesterolemia    Hypertension    Small bowel obstruction ([facility]     Past Surgical History:  Procedure Laterality Date   BOWEL BLOCKAGE  05/2019   IR RADIOLOGIST EVAL & MGMT  08/19/2021    Allergies: Escitalopram and Lisinopril  Medications: Prior to Admission medications   Medication Sig Start Date End Date Taking? Authorizing Provider  amLODipine (NORVASC) 5 MG tablet Take 1 tablet (5 mg total) by mouth every evening. 08/14/21 08/04/23 Yes Cantwell, Celeste C, PA-C  carbidopa-levodopa (SINEMET IR) 25-100 MG tablet 2 at 7am, 1 at 11am, 1 at 4pm Patient taking differently: Take 1-2 tablets by mouth See admin instructions. 2 at 7am, 1 at 11am, 1 at 4pm 08/08/21  Yes Tat, REustace Quail DO  indomethacin (INDOCIN) 50 MG capsule Take 50 mg by mouth 3 (three) times daily as needed for moderate pain (gout pain). 12/17/17  Yes  [provider]  pramipexole (MIRAPEX) 0.5 MG tablet Take 1 tablet (0.5 mg total) by mouth 3 (three) times daily. 08/08/21  Yes Tat, REustace Quail DO  rosuvastatin (CRESTOR) 20 MG tablet Take 20 mg by mouth daily.  12/17/17  Yes [provider]  vitamin B-12 (CYANOCOBALAMIN) 500 MCG tablet Take 500 mcg by mouth daily.   Yes [provider]     Family History  Problem Relation Age of Onset   Hypertension Mother    Arthritis Father    Prostate cancer Brother     Social History   Socioeconomic History   Marital status: Married    Spouse name: Not on file   Number of children: 2   Years of education: 16   Highest education level: Bachelor's degree (e.g., BA, AB, BS)  Occupational History   Occupation: sales  Tobacco Use   Smoking status: Former    Packs/day: 0.50    Years: 5.00    Total pack years: 2.50    Types: Cigarettes    Quit date: 1980    Years since quitting: 43.6   Smokeless tobacco: Never  Vaping Use   Vaping Use: Never used  Substance and Sexual Activity   Alcohol use: Yes    Comment: 2 glasses wine/day prior to bowel surgery; since bowel surger 05/2019 - only 2 glasses since   Drug use: Never   Sexual activity: Not on file  Other Topics Concern   Not on file  Social History Narrative   Lives with wife in a one story home.  Has 2 children.  Self  employed Hotel manager.  Education: college.       RIGHT HANDED   Social Determinants of Health   Financial Resource Strain: Not on file  Food Insecurity: Not on file  Transportation Needs: Not on file  Physical Activity: Not on file  Stress: Not on file  Social Connections: Not on file    Review of Systems: A 12 point ROS discussed and pertinent positives are indicated in the HPI above.  All other systems are negative.  Review of Systems  Vital Signs: There were no vitals taken for this visit.  Physical Exam  Imaging: No results found.  Labs:  CBC: No results for input(s): "WBC",  "HGB", "HCT", "PLT" in the last 8760 hours.  COAGS: No results for input(s): "INR", "APTT" in the last 8760 hours.  BMP: No results for input(s): "NA", "K", "CL", "CO2", "GLUCOSE", "BUN", "CALCIUM", "CREATININE", "GFRNONAA", "GFRAA" in the last 8760 hours.  Invalid input(s): "CMP"  LIVER FUNCTION TESTS: No results for input(s): "BILITOT", "AST", "ALT", "ALKPHOS", "PROT", "ALBUMIN" in the last 8760 hours.  TUMOR MARKERS: No results for input(s): "AFPTM", "CEA", "CA199", "CHROMGRNA" in the last 8760 hours.  Assessment and Plan:  Left supraclinoid ICA aneurysm: Cody Dickerson, 71 year old male, presents today to [facility] Neuro Interventional Radiology department for an image-guided diagnostic cerebral angiogram.  Risks and benefits of diagnostic cerebral angiogram were discussed with the patient including, but not limited to bleeding, infection, vascular injury, stroke and contrast induced renal failure.  This interventional procedure involves the use of X-rays and because of the nature of the planned procedure, it is possible that we will have prolonged use of X-ray fluoroscopy.  Potential radiation risks to you include (but are not limited to) the following: - A slightly elevated risk for cancer  several years later in life. This risk is typically less than 0.5% percent. This risk is low in comparison to the normal incidence of human cancer, which is 33% for women and 50% for men according to the [place]. - Radiation induced injury can include skin redness, resembling a rash, tissue breakdown / ulcers and hair loss (which can be temporary or permanent).   The likelihood of either of these occurring depends on the difficulty of the procedure and whether you are sensitive to radiation due to previous procedures, disease, or genetic conditions.   IF your procedure requires a prolonged use of radiation, you will be notified and given written instructions for further  action.  It is your responsibility to monitor the irradiated area for the 2 weeks following the procedure and to notify your physician if you are concerned that you have suffered a radiation induced injury.    All of the patient's questions were answered, patient is agreeable to proceed. He has been NPO.   Consent signed and in chart.  Thank you for this interesting consult.  I greatly enjoyed meeting Cody Dickerson and look forward to participating in their care.  A copy of this report was sent to the requesting provider on this date.  Electronically Signed: Soyla Dryer, AGACNP-BC [phone] 10/02/2021, 2:56 PM   I spent a total of  30 Minutes   in face to face in clinical consultation, greater than 50% of which was counseling/coordinating care for diagnostic cerebral angiogram.

## 2021-10-03 ENCOUNTER — Ambulatory Visit (HOSPITAL_COMMUNITY)
Admission: RE | Admit: 2021-10-03 | Discharge: 2021-10-03 | Disposition: A | Discharge status: Home / Self Care | Payer: Medicare Other | Source: Ambulatory Visit | Attending: Interventional Radiology | Admitting: Interventional Radiology

## 2021-10-03 ENCOUNTER — Other Ambulatory Visit (HOSPITAL_COMMUNITY): Payer: Self-pay | Admitting: Interventional Radiology

## 2021-10-03 ENCOUNTER — Other Ambulatory Visit: Payer: Self-pay

## 2021-10-03 DIAGNOSIS — G20 Parkinson's disease: Secondary | ICD-10-CM | POA: Diagnosis not present

## 2021-10-03 DIAGNOSIS — I10 Essential (primary) hypertension: Secondary | ICD-10-CM | POA: Diagnosis not present

## 2021-10-03 DIAGNOSIS — I671 Cerebral aneurysm, nonruptured: Secondary | ICD-10-CM | POA: Diagnosis not present

## 2021-10-03 DIAGNOSIS — I6521 Occlusion and stenosis of right carotid artery: Secondary | ICD-10-CM | POA: Insufficient documentation

## 2021-10-03 DIAGNOSIS — Z87891 Personal history of nicotine dependence: Secondary | ICD-10-CM | POA: Insufficient documentation

## 2021-10-03 HISTORY — PX: IR ANGIO VERTEBRAL SEL VERTEBRAL UNI R MOD SED: IMG5368

## 2021-10-03 HISTORY — PX: IR ANGIO INTRA EXTRACRAN SEL COM CAROTID INNOMINATE BILAT MOD SED: IMG5360

## 2021-10-03 LAB — BASIC METABOLIC PANEL
Anion gap: 7 (ref 5–15)
BUN: 15 mg/dL (ref 8–23)
CO2: 26 mmol/L (ref 22–32)
Calcium: 9.3 mg/dL (ref 8.9–10.3)
Chloride: 107 mmol/L (ref 98–111)
Creatinine, Ser: 1.03 mg/dL (ref 0.61–1.24)
GFR, Estimated: >60 mL/min (ref >60)
Glucose, Bld: 88 mg/dL (ref 70–99)
Potassium: 3.6 mmol/L (ref 3.5–5.1)
Sodium: 140 mmol/L (ref 135–145)

## 2021-10-03 LAB — CBC WITH DIFFERENTIAL/PLATELET
Abs Immature Granulocytes: 0.01 10*3/uL (ref 0.00–0.07)
Basophils Absolute: 0 10*3/uL (ref 0.0–0.1)
Basophils Relative: 1 %
Eosinophils Absolute: 0.2 10*3/uL (ref 0.0–0.5)
Eosinophils Relative: 4 %
HCT: 38.9 % — ABNORMAL LOW (ref 39.0–52.0)
Hemoglobin: 13.1 g/dL (ref 13.0–17.0)
Immature Granulocytes: 0 %
Lymphocytes Relative: 14 %
Lymphs Abs: 0.7 10*3/uL (ref 0.7–4.0)
MCH: 31 pg (ref 26.0–34.0)
MCHC: 33.7 g/dL (ref 30.0–36.0)
MCV: 92.2 fL (ref 80.0–100.0)
Monocytes Absolute: 0.6 10*3/uL (ref 0.1–1.0)
Monocytes Relative: 12 %
Neutro Abs: 3.5 10*3/uL (ref 1.7–7.7)
Neutrophils Relative %: 69 %
Platelets: 134 10*3/uL — ABNORMAL LOW (ref 150–400)
RBC: 4.22 MIL/uL (ref 4.22–5.81)
RDW: 12.6 % (ref 11.5–15.5)
WBC: 5.1 10*3/uL (ref 4.0–10.5)
nRBC: 0 % (ref 0.0–0.2)

## 2021-10-03 LAB — PROTIME-INR
INR: 1.1 (ref 0.8–1.2)
Prothrombin Time: 13.7 seconds (ref 11.4–15.2)

## 2021-10-03 MED ORDER — FENTANYL CITRATE (PF) 100 MCG/2ML IJ SOLN
INTRAMUSCULAR | Status: AC | PRN
  Administered 2021-10-03 (×2): 25 ug via INTRAVENOUS

## 2021-10-03 MED ORDER — LIDOCAINE HCL 1 % IJ SOLN
INTRAMUSCULAR | Status: AC
  Filled 2021-10-03: qty 20

## 2021-10-03 MED ORDER — IOHEXOL 300 MG/ML  SOLN
100.0000 mL | Freq: Once | INTRAMUSCULAR | Status: AC | PRN
  Administered 2021-10-03: 5 mL via INTRA_ARTERIAL

## 2021-10-03 MED ORDER — NITROGLYCERIN 1 MG/10 ML FOR IR/CATH LAB
INTRA_ARTERIAL | Status: AC
  Filled 2021-10-03: qty 10

## 2021-10-03 MED ORDER — MIDAZOLAM HCL 2 MG/2ML IJ SOLN
INTRAMUSCULAR | Status: AC
  Filled 2021-10-03: qty 2

## 2021-10-03 MED ORDER — SODIUM CHLORIDE 0.9 % IV SOLN: INTRAVENOUS | Status: DC

## 2021-10-03 MED ORDER — VERAPAMIL HCL 2.5 MG/ML IV SOLN
INTRA_ARTERIAL | Status: AC | PRN
  Administered 2021-10-03: 5 mL via INTRA_ARTERIAL

## 2021-10-03 MED ORDER — HEPARIN SODIUM (PORCINE) 1000 UNIT/ML IJ SOLN
INTRAMUSCULAR | Status: AC
  Filled 2021-10-03: qty 10

## 2021-10-03 MED ORDER — SODIUM CHLORIDE 0.9 % IV SOLN: INTRAVENOUS | Status: AC

## 2021-10-03 MED ORDER — FENTANYL CITRATE (PF) 100 MCG/2ML IJ SOLN
INTRAMUSCULAR | Status: AC
  Filled 2021-10-03: qty 2

## 2021-10-03 MED ORDER — ATROPINE SULFATE 1 MG/ML IV SOLN
INTRAVENOUS | Status: AC
  Filled 2021-10-03: qty 1

## 2021-10-03 MED ORDER — VERAPAMIL HCL 2.5 MG/ML IV SOLN
INTRAVENOUS | Status: AC
  Filled 2021-10-03: qty 2

## 2021-10-03 MED ORDER — NITROGLYCERIN 1 MG/10 ML FOR IR/CATH LAB
INTRA_ARTERIAL | Status: AC | PRN
  Administered 2021-10-03: 200 ug via INTRA_ARTERIAL

## 2021-10-03 MED ORDER — IOHEXOL 300 MG/ML  SOLN
100.0000 mL | Freq: Once | INTRAMUSCULAR | Status: AC | PRN
  Administered 2021-10-03: 75 mL via INTRA_ARTERIAL

## 2021-10-03 MED ORDER — HEPARIN SODIUM (PORCINE) 1000 UNIT/ML IJ SOLN
INTRAMUSCULAR | Status: AC | PRN
  Administered 2021-10-03: 1000 [IU] via INTRAVENOUS

## 2021-10-03 MED ORDER — MIDAZOLAM HCL 2 MG/2ML IJ SOLN
INTRAMUSCULAR | Status: AC | PRN
  Administered 2021-10-03: 1 mg via INTRAVENOUS
  Administered 2021-10-03: .5 mg via INTRAVENOUS

## 2021-10-03 NOTE — Sedation Documentation (Signed)
Soyla Dryer, NP and Dr. Estanislado Pandy at bedside to discuss case and plan of care with patient.

## 2021-10-03 NOTE — Procedures (Signed)
INR. Bilateral common carotid and right vertebral arteriogram. Right radial approach. Findings. 1.  Approximately 2.5 mm infundibulum arising in the region of the left posterior communicating.  Arlean Hopping MD.

## 2021-10-04 ENCOUNTER — Other Ambulatory Visit (HOSPITAL_COMMUNITY): Payer: Self-pay | Admitting: Interventional Radiology

## 2021-10-04 DIAGNOSIS — I671 Cerebral aneurysm, nonruptured: Secondary | ICD-10-CM

## 2021-10-04 HISTORY — PX: IR US GUIDE VASC ACCESS RIGHT: IMG2390

## 2021-10-09 ENCOUNTER — Ambulatory Visit: Payer: Medicare Other

## 2021-10-09 DIAGNOSIS — I6523 Occlusion and stenosis of bilateral carotid arteries: Secondary | ICD-10-CM | POA: Diagnosis not present

## 2021-12-13 DIAGNOSIS — Z23 Encounter for immunization: Secondary | ICD-10-CM | POA: Diagnosis not present

## 2022-01-28 DIAGNOSIS — I10 Essential (primary) hypertension: Secondary | ICD-10-CM | POA: Diagnosis not present

## 2022-01-28 DIAGNOSIS — M1A9XX1 Chronic gout, unspecified, with tophus (tophi): Secondary | ICD-10-CM | POA: Diagnosis not present

## 2022-01-28 DIAGNOSIS — Z125 Encounter for screening for malignant neoplasm of prostate: Secondary | ICD-10-CM | POA: Diagnosis not present

## 2022-01-28 DIAGNOSIS — E7800 Pure hypercholesterolemia, unspecified: Secondary | ICD-10-CM | POA: Diagnosis not present

## 2022-02-04 DIAGNOSIS — Z125 Encounter for screening for malignant neoplasm of prostate: Secondary | ICD-10-CM | POA: Diagnosis not present

## 2022-02-04 DIAGNOSIS — R2689 Other abnormalities of gait and mobility: Secondary | ICD-10-CM | POA: Diagnosis not present

## 2022-02-04 DIAGNOSIS — S51812A Laceration without foreign body of left forearm, initial encounter: Secondary | ICD-10-CM | POA: Diagnosis not present

## 2022-02-04 DIAGNOSIS — I10 Essential (primary) hypertension: Secondary | ICD-10-CM | POA: Diagnosis not present

## 2022-02-04 DIAGNOSIS — Z6825 Body mass index (BMI) 25.0-25.9, adult: Secondary | ICD-10-CM | POA: Diagnosis not present

## 2022-02-04 DIAGNOSIS — Z0000 Encounter for general adult medical examination without abnormal findings: Secondary | ICD-10-CM | POA: Diagnosis not present

## 2022-02-04 DIAGNOSIS — M1A9XX1 Chronic gout, unspecified, with tophus (tophi): Secondary | ICD-10-CM | POA: Diagnosis not present

## 2022-02-04 DIAGNOSIS — E7800 Pure hypercholesterolemia, unspecified: Secondary | ICD-10-CM | POA: Diagnosis not present

## 2022-02-10 ENCOUNTER — Other Ambulatory Visit: Payer: Self-pay | Admitting: Neurology

## 2022-02-10 NOTE — Progress Notes (Unsigned)
Assessment/Plan:   1.  Parkinsons Disease  -DaTscan previously fairly equivocal, but has met clinical criteria now  -Continue carbidopa/levodopa 25/100, 2/1/1   -Continue pramipexole 0.5 mg 3 times per day.  2.  Left distal supraclinoid ICA aneurysm  -Small, incidental, 2 mm.  Patient had wanted referral to Dr. Estanislado Pandy, which we did previously.  They attempted to call the patient multiple times, according to his office, and phone call was never returned.  Pt states that they never contacted him but his sister works at [employer] with a neurosx and sent his images and the neurosx told him that there was nothing more to do.    -CTA was repeated in April, 2022 and aneurysm size was stable.   -pt would like referral to dr deveshwar again for opinion  3.  MCI  -Neurocognitive testing done with Dr. Nicole Kindred in September, 2022 demonstrated MCI.  4.  Dizziness  -don't think that this is related to Parkinsons Disease or meds, as I d/c the carbidopa/levodopa and it didn't change sx's  -discussed concept of permissive hypertension   5.  Depression  -denies  -tried to give lexapro but felt it caused no energy and pt d/c (he took it for a few days).   Subjective:   Cody Dickerson was seen today in follow up for Parkinsons disease.  My previous records were reviewed prior to todays visit as well as outside records available to me. Pt with wife who supplements hx.   patient is on levodopa, and pramipexole was started last visit.  He is having no compulsive behaviors or sleep attacks.  He has had no falls.    Current prescribed movement disorder medications: carbidopa/levodopa 25/100, 2/1/1 Pramipexole, 0.5 mg 3 times per day (started last visit)  Previous meds:  lexapro (took for a few days and felt it caused decreased energy)  ALLERGIES:   Allergies  Allergen Reactions   Escitalopram     felt weird and tired   Lisinopril Cough    CURRENT MEDICATIONS:  Outpatient Encounter  Medications as of 02/11/2022  Medication Sig   amLODipine (NORVASC) 5 MG tablet Take 1 tablet (5 mg total) by mouth every evening.   carbidopa-levodopa (SINEMET IR) 25-100 MG tablet 2 at 7am, 1 at 11am, 1 at 4pm (Patient taking differently: Take 1-2 tablets by mouth See admin instructions. 2 at 7am, 1 at 11am, 1 at 4pm)   indomethacin (INDOCIN) 50 MG capsule Take 50 mg by mouth 3 (three) times daily as needed for moderate pain (gout pain).   pramipexole (MIRAPEX) 0.5 MG tablet Take 1 tablet (0.5 mg total) by mouth 3 (three) times daily.   rosuvastatin (CRESTOR) 20 MG tablet Take 20 mg by mouth daily.    vitamin B-12 (CYANOCOBALAMIN) 500 MCG tablet Take 500 mcg by mouth daily.   No facility-administered encounter medications on file as of 02/11/2022.    Objective:   PHYSICAL EXAMINATION:    VITALS:   There were no vitals filed for this visit.      GEN:  The patient appears stated age and is in NAD. HEENT:  Normocephalic, atraumatic.  The mucous membranes are moist. The superficial temporal arteries are without ropiness or tenderness. CV:  brady.  regular Lungs:  CTAB Neck/HEME:  There are no carotid bruits bilaterally.  Neurological examination:  Orientation: The patient is alert and oriented x3. Cranial nerves: There is good facial symmetry with facial hypomimia. The speech is fluent and clear. Soft palate rises symmetrically  and there is no tongue deviation. Hearing is intact to conversational tone. Sensation: Sensation is intact to light touch throughout Motor: Strength is at least antigravity x4.  Movement examination: Tone: There is mild to mod increased tone in the RUE Abnormal movements: rare tremor in the RLE Coordination:  There is no significant decremation with RAMs Gait and Station: The patient has no difficulty arising out of a deep-seated chair without the use of the hands. The patient's stride length is slightly decreased arm swing on the R.      Total time  spent on today's visit was 41 minutes, including both face-to-face time and nonface-to-face time.  Time included that spent on review of records (prior notes available to me/labs/imaging if pertinent), discussing treatment and goals, answering patient's questions and coordinating care.  Cc:  Lawerance Cruel, MD

## 2022-02-11 ENCOUNTER — Ambulatory Visit (INDEPENDENT_AMBULATORY_CARE_PROVIDER_SITE_OTHER): Payer: Medicare Other | Admitting: Neurology

## 2022-02-11 ENCOUNTER — Encounter: Payer: Self-pay | Admitting: Neurology

## 2022-02-11 VITALS — BP 132/82 | HR 68 | Ht 68.0 in | Wt 161.2 lb

## 2022-02-11 DIAGNOSIS — R42 Dizziness and giddiness: Secondary | ICD-10-CM

## 2022-02-11 DIAGNOSIS — G20A1 Parkinson's disease without dyskinesia, without mention of fluctuations: Secondary | ICD-10-CM

## 2022-02-11 DIAGNOSIS — I6523 Occlusion and stenosis of bilateral carotid arteries: Secondary | ICD-10-CM | POA: Diagnosis not present

## 2022-02-11 MED ORDER — CARBIDOPA-LEVODOPA 25-100 MG PO TABS: ORAL_TABLET | ORAL | 2 refills | Status: DC

## 2022-02-11 MED ORDER — PRAMIPEXOLE DIHYDROCHLORIDE 0.5 MG PO TABS: 0.5000 mg | ORAL_TABLET | Freq: Three times a day (TID) | ORAL | 2 refills | Status: DC

## 2022-02-11 NOTE — Patient Instructions (Signed)
Local and Online Resources for Power over Parkinson's Group  December 2023    LOCAL [place] PARKINSON'S GROUPS   Power over Parkinson's Group:    Power Over Parkinson's Patient Education Group will be Wednesday, December 13th-*Hybrid meting*- in person at [place] [facility] location and via WEBEX, 2:00-3:00 pm.   Starting in November, Power over Parkinson's and Care Partner Groups will meet together, with plans for separate break out session for caregivers (*this will be evolving over the next few months) Upcoming Power over Parkinson's Meetings/Care Partner Support:  2nd Wednesdays of the month at 2 pm:   December 13th, January 10th  Contact Amy Marriott at [email or web address] if interested in participating in this group    LOCAL EVENTS AND NEW OFFERINGS  Parkinson's Holiday Party!  Wednesday, December 6th, 4:00-5:00 pm.  [employer].  RSVP to Karen Simmers at [phone] or [email or web address] New PWR! Moves Community Fitness Instructor-Led Classes offering at [employer]!  TUESDAYS and Wednesdays 1-2 pm.   Contact Christy Weaver at  [email or web address]  or [phone] (Tuesday classes are modified for chair and standing only) Dance for Parkinson 's classes will be on Tuesdays 9:30am-10:30am starting October 3-December 12 with a break the week of November 21st. Located in the [facility], in the first floor of the [facility] ([address] To register:  [email or web address] or [phone]  Drumming for Parkinson's will be held on 2nd and 4th Mondays at 11:00 am.   Located at the [employer] ([address] [place]  Contact Jane Maydian at [email or web address] or [phone]  Through support from the Parkinson's Foundation, the Dance and Drumming for Parkinson's classes are free for both patients and caregivers.    [employer] Parkinson's Tai Chi Class, Mondays at  11 am.  Call [phone] for details   ONLINE EDUCATION AND SUPPORT  Parkinson Foundation:  www.parkinson.org  PD Health at Home continues:  Mindfulness Mondays, Wellness Wednesdays, Fitness Fridays   Upcoming Education:    Eating and Feeling Well through the Holidays. Wednesday, Dec. 6th,  1-2 pm  Hospital Safety.  Wednesday, Dec. 13th, 1-2 pm Register for expert briefings (webinars) at [email or web address].[email or web address].org/resources-support/online-education/expert-briefings-webinars  Please check out their website to sign up for emails and see their full online offerings      [employer] Foundation:  www.michaeljfox.org   Third Thursday Webinars:  On the third Thursday of every month at 12 p.m. ET, join our free live webinars to learn about various aspects of living with Parkinson's disease and our work to speed medical breakthroughs.  Upcoming Webinar:  Tools for Diagnosing and Visualizing Parkinson's Disease.  Thursday, December 21st at 12 noon. Check out additional information on their website to see their full online offerings    [employer] Phinney Foundation:  www.davisphinneyfoundation.org  Upcoming Webinar:   Stay tuned  Webinar Series:  Living with Parkinson's Meetup.   Third Thursdays each month, 3 pm  Care Partner Monthly Meetup.  With Connie Carpenter Phinney.  First Tuesday of each month, 2 pm  Check out additional information to Live Well Today on their website    Parkinson and Movement Disorders (PMD) Alliance:  www.pmdalliance.org  [employer]:  Online Education Events  Sign up for emails, which are sent weekly to give you updates on programming and online offerings    Parkinson's Association of the [place]:  www.parkinsonassociation.org  Information on online support groups, education events, and online exercises including Yoga, Parkinson's exercises and more-LOTS of information on   links to PD resources and online events  Virtual Support Group through Parkinson's Association  of the [place]; next one is scheduled for Wednesday, December 6th  at 2 pm.  (These are typically scheduled for the 1st Wednesday of the month at 2 pm).  Visit website for details.   MOVEMENT AND EXERCISE OPPORTUNITIES  PWR! Moves Classes at [place].  Wednesdays 10 and 11 am.   Contact Amy Marriott, PT [email or web address] if interested.  NEW PWR! Moves Class offerings at [employer].  *TUESDAYS* and Wednesdays 1-2 pm.    Contact Vonna Kotyk at  [email or web address]    Parkinson's Wellness Recovery (PWR! Moves)  www.pwr4life.org  Info on the PWR! Virtual Experience:  You will have access to our expertise?through self-assessment, guided plans that start with the PD-specific fundamentals, educational content, tips, Q&A with an expert, and a growing Art therapist of PD-specific pre-recorded and live exercise classes of varying types and intensity - both physical and cognitive! If that is not enough, we offer 1:1 wellness consultations (in-person or virtual) to personalize your PWR! Research scientist (medical).   [place] Fridays:   As part of the PD Health @ Home program, this free video series focuses each week on one aspect of fitness designed to support people living with Parkinson's.? These weekly videos highlight the [place] fitness guidelines for people with Parkinson's disease.  ModemGamers.[email or web address]   Dance for PD website is offering free, live-stream classes throughout the week, as well as links to [employer] of classes:  [email or web address].[email or web address]  Virtual dance and Pilates for Parkinson's classes: Click on the Community Tab> Parkinson's Movement Initiative Tab.  To register for classes and for more information, visit [email or web address] and click the "community" tab.   [employer] Parkinson's Cycling Classes   [employer]:  Thursdays @ Noon-Live classes at [employer] ([employer] at [email or web address]?or [phone])  [facility]: Virtual Classes Mondays and Thursdays Jeanette Caprice classes Tuesday, Wednesday and Thursday (contact [place] at [email or web address] ?or [phone])  [place]  Varied levels of classes are offered Tuesdays and Thursdays at [employer].   Stretching with Verdis Frederickson weekly class is also offered for people with Parkinson's  To observe a class or for more information, call [phone] or email Hezzie Bump at [email or web address]   ADDITIONAL SUPPORT AND RESOURCES  [employer]:Online Caregiver Education Opportunities:  www.well-springsolutions.org/caregiver-education/caregiver-support-group.  You may also contact Vickki Muff at [email or web address] or [phone].     [employer] Navigator:  Just1Navigator program, a?free service to help individuals and families through the journey of determining care for older adults.  The "Navigator" is a Education officer, museum, Arnell Asal, who will speak with a prospective client and/or loved ones to provide an assessment of the situation and a set of recommendations for a personalized care plan -- all free of charge, and whether?[employer] offers the needed service or not. If the need is not a service we provide, we are well-connected with reputable programs in town that we can refer you to.  www.well-springsolutions.org or to speak with the Navigator, call [phone].

## 2022-02-12 NOTE — Progress Notes (Signed)
Primary Physician/Referring:  Lawerance Cruel, MD  Patient ID: Cody Dickerson, male    DOB: 09-07-1950, 71 y.o.   MRN: [id number]  Chief Complaint  Patient presents with   Carotid stenosis   Follow-up    6 months   HPI:    Cody Dickerson  is a 71 y.o. Fairly active Caucasian male patient with supine hypertension, orthostatic hypotension, Parkinson's disease, asymptomatic bilateral carotid artery stenosis, hypercholesterolemia who was last seen in our office 6 months ago presents for follow-up of hypertension, carotid stenosis and hypercholesterolemia.  His main complaint continues to be gait instability, feeling imbalance and has had occasional fall.  He has been extensively evaluated by neurology Dr. Wells Guiles Tat.  He also underwent cerebral angiography to evaluate for small aneurysm felt to be insignificant on 10/03/2021.  Otherwise from cardiac standpoint he has no other specific complaints.  He has not had any syncope or chest pain or dyspnea.  Past Medical History:  Diagnosis Date   Balance problem    Gout    Hypercholesterolemia    Hypertension    Parkinson's disease    Small bowel obstruction (HCC)    Past Surgical History:  Procedure Laterality Date   BOWEL BLOCKAGE  05/2019   IR ANGIO INTRA EXTRACRAN SEL COM CAROTID INNOMINATE BILAT MOD SED  10/03/2021   IR ANGIO VERTEBRAL SEL VERTEBRAL UNI R MOD SED  10/03/2021   IR RADIOLOGIST EVAL & MGMT  08/19/2021   IR US GUIDE VASC ACCESS RIGHT  10/04/2021    Social History   Tobacco Use   Smoking status: Former    Packs/day: 0.50    Years: 5.00    Total pack years: 2.50    Types: Cigarettes    Quit date: 1980    Years since quitting: 43.9   Smokeless tobacco: Never  Substance Use Topics   Alcohol use: Yes    Comment: 2 glasses wine/day prior to bowel surgery; since bowel surger 05/2019 - only 2 glasses since  Marital Status: Married   ROS  Review of Systems  Cardiovascular:  Negative for chest pain, dyspnea on exertion  and leg swelling.  Neurological:  Positive for dizziness.   Objective  Blood pressure 130/70, pulse (!) 56, resp. rate 16, height '5\' 8"'$  (1.727 m), weight 161 lb 12.8 oz (73.4 kg), SpO2 100 %. Body mass index is 24.6 kg/m.     02/13/2022   10:26 AM 02/11/2022   11:13 AM 10/03/2021    1:10 PM  Vitals with BMI  Height '5\' 8"'$  '5\' 8"'$    Weight 161 lbs 13 oz 161 lbs 3 oz   BMI 96.78 93.81   Systolic 017 510 258  Diastolic 70 82 74  Pulse 56 68 53   Orthostatic VS for the past 72 hrs (Last 3 readings):  Orthostatic BP Patient Position BP Location Cuff Size Orthostatic Pulse  02/13/22 1056 100/60 Standing -- -- --  02/13/22 1052 110/60 Standing -- -- --  02/13/22 1038 130/70 Sitting Left Arm Normal 54  02/13/22 1026 -- Sitting Left Arm Normal --    Physical Exam Vitals reviewed.  Neck:     Vascular: Carotid bruit (Right) present. No JVD.  Cardiovascular:     Rate and Rhythm: Normal rate and regular rhythm.     Pulses: Normal pulses and intact distal pulses.     Heart sounds: S1 normal and S2 normal. Murmur heard.     Early systolic murmur is present with a grade of 1/6  at the upper right sternal border.     No gallop.  Pulmonary:     Effort: Pulmonary effort is normal.     Breath sounds: Normal breath sounds.  Musculoskeletal:     Right lower leg: Edema (trace ankle) present.     Left lower leg: Edema (trace ankle) present.     Laboratory examination:   Lab Results  Component Value Date   NA 140 10/03/2021   K 3.6 10/03/2021   CO2 26 10/03/2021   GLUCOSE 88 10/03/2021   BUN 15 10/03/2021   CREATININE 1.03 10/03/2021   CALCIUM 9.3 10/03/2021   GFRNONAA >60 10/03/2021       Latest Ref Rng & Units 10/03/2021    7:17 AM  CMP  Glucose 70 - 99 mg/dL 88   BUN 8 - 23 mg/dL 15   Creatinine 0.61 - 1.24 mg/dL 1.03   Sodium 135 - 145 mmol/L 140   Potassium 3.5 - 5.1 mmol/L 3.6   Chloride 98 - 111 mmol/L 107   CO2 22 - 32 mmol/L 26   Calcium 8.9 - 10.3 mg/dL 9.3        Latest Ref Rng & Units 10/03/2021    7:17 AM  CBC  WBC 4.0 - 10.5 K/uL 5.1   Hemoglobin 13.0 - 17.0 g/dL 13.1   Hematocrit 39.0 - 52.0 % 38.9   Platelets 150 - 400 K/uL 134    External labs:   Cholesterol, total 173.000 m 01/28/2022 HDL 84.000 mg 01/28/2022 LDL 83.000 mg 01/16/2021 Triglycerides 59.000 mg 01/28/2022 Non-HDL cholesterol 92.  TSH 0.920 08/13/2020  Allergies   Allergies  Allergen Reactions   Escitalopram     felt weird and tired   Lisinopril Cough    MEDICATIONS:   Current Outpatient Medications:    carbidopa-levodopa (SINEMET IR) 25-100 MG tablet, 2 at 7am, 1 at 11am, 1 at 4pm, Disp: 360 tablet, Rfl: 2   indomethacin (INDOCIN) 50 MG capsule, Take 50 mg by mouth 3 (three) times daily as needed for moderate pain (gout pain)., Disp: , Rfl:    pramipexole (MIRAPEX) 0.5 MG tablet, Take 1 tablet (0.5 mg total) by mouth 3 (three) times daily., Disp: 270 tablet, Rfl: 2   rosuvastatin (CRESTOR) 20 MG tablet, Take 20 mg by mouth daily. , Disp: , Rfl: 4   vitamin B-12 (CYANOCOBALAMIN) 500 MCG tablet, Take 500 mcg by mouth daily., Disp: , Rfl:    Radiology:   Extracranial and intracranial cerebral angiogram 10/03/2021: 1. The right internal carotid artery at the bulb and just distally demonstrates a complex irregular atherosclerotic plaque associated with a stenosis of approximately 60% by the NASCET criteria. 2. The left internal carotid artery at the bulb to the cranial skull base is widely patent. 3.  Bilateral vertebral arteries are widely patent. 4.  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   Cardiac Studies:   PCV ECHOCARDIOGRAM COMPLETE 09/21/2020  Narrative Echocardiogram 09/21/2020: Normal LV systolic function with visual EF 60-65%. Left ventricle cavity is normal in size. Mild left ventricular hypertrophy. Normal global wall motion. Normal diastolic filling pattern, normal LAP. Left atrial cavity is mildly dilated. A lipomatous septum is  present. Mild (Grade I) mitral regurgitation. Mild tricuspid regurgitation. No evidence of pulmonary hypertension. Moderate pulmonic regurgitation. No prior study for comparison.    Exercise treadmill stress test 10/05/2020: Exercise treadmill stress test performed using Bruce protocol.  Patient reached 7.5 METS, and 68% of age predicted maximum heart rate.  Exercise capacity was  fair.  No chest pain reported.  Submaximal heart rate and normal blood pressure response. Stress EKG revealed no ischemic changes. Inconclusive study due to submaximal heart rate response.   Carotid artery duplex 10/09/2021: Duplex suggests stenosis in the right internal carotid artery (50-69%). Duplex suggests stenosis in the left internal carotid artery (1-15%). Antegrade right vertebral artery flow. Antegrade left vertebral artery flow. No significant change since prior study 03/05/2021. Follow up in six months is appropriate if clinically indicated.  EKG:   EKG 02/13/2022: Normal sinus rhythm with rate of 52 bpm, normal EKG. No significant change from  08/14/2021  Assessment     ICD-10-CM   1. Bilateral carotid artery stenosis  I65.23 EKG 12-Lead    2. Primary hypertension  I10     3. Orthostatic hypotension  I95.1     4. Hypercholesteremia  E78.00       Medications Discontinued During This Encounter  Medication Reason   amLODipine (NORVASC) 5 MG tablet Side effect (s)  Orthostatic hypotension 02/13/22   No orders of the defined types were placed in this encounter.  Orders Placed This Encounter  Procedures   EKG 12-Lead   Recommendations:   TEEJAY MEADER is a 71 y.o.   Fairly active Caucasian male patient with supine hypertension, orthostatic hypotension, Parkinson's disease, asymptomatic bilateral carotid artery stenosis, hypercholesterolemia who was last seen in our office 6 months ago presents for follow-up of hypertension, carotid stenosis and hypercholesterolemia.  He remains  asymptomatic.  1. Bilateral carotid artery stenosis Patient has 50 to 69% stenosis of right ICA, he underwent carotid angiography on 10/03/2021 which correlates with ultrasound findings as well.  Will continue 6 monthly surveillance.  Appeared to be a complex stenosis.  2. Primary hypertension Supine hypertension and orthostatic hypotension is present.  He is profoundly orthostatic today.  Hence diagnosis of primary hypertension needs to be resolved.  He is presently on 5 mg of amlodipine which I will discontinue, neurology notes from Dr. Carles Collet reviewed, agree with her that he needs to have permissive hypertension.  Patient has also had occasional episodes of fall, about a week or 10 days ago had an episode of fall while trying to get up and go to the bathroom in the night.  Hopefully by discontinuing amlodipine he will feel better and more steady on his feet.  3. Orthostatic hypotension As dictated above, permissive hypertension is indicated.  Please treat standing blood pressure and ignore sitting blood pressure.  Especially standing blood pressure at 3 to 4 minutes is profoundly hypotensive/reduced blood pressure.  A differential of 30 mmHg from 130-100 mmHg.  He will be greatly helped by using support stockings especially when he is active and on his feet.  He also needs a sleep slightly reclined to avoid supine hypertension.  Support stockings prescription for 30 mmHg given to the patient for knee-high.  4. Hypercholesteremia Lipids under excellent control, in view of carotid stenosis, LDL goal <70 however although his LDL is high, non-HDL cholesterol is at goal hence no changes were indicated.  I will see him back in 6 months with repeat carotid artery duplex and follow-up on his orthostasis.  40-minute office visit encounter in review of all his medical records and coordination of care.    Adrian Prows, PA-C 02/13/2022, 11:00 AM Office: [phone]

## 2022-02-12 NOTE — Progress Notes (Incomplete)
Primary Physician/Referring:  Lawerance Cruel, MD  Patient ID: Cody Dickerson, male    DOB: 01-13-1951, 71 y.o.   MRN: [id number]  No chief complaint on file.  HPI:    Cody Dickerson  is a 71 y.o. Fairly active Caucasian male patient with supine hypertension, orthostatic hypotension, Parkinson's disease, asymptomatic bilateral carotid artery stenosis, hypercholesterolemia who was last seen in our office 6 months ago presents for follow-up of hypertension, carotid stenosis and hypercholesterolemia.  He remains asymptomatic.  Past Medical History:  Diagnosis Date  . Balance problem   . Gout   . Hypercholesterolemia   . Hypertension   . Small bowel obstruction [facility])    Past Surgical History:  Procedure Laterality Date  . BOWEL BLOCKAGE  05/2019  . IR ANGIO INTRA EXTRACRAN SEL COM CAROTID INNOMINATE BILAT MOD SED  10/03/2021  . IR ANGIO VERTEBRAL SEL VERTEBRAL UNI R MOD SED  10/03/2021  . IR RADIOLOGIST EVAL & MGMT  08/19/2021  . IR US GUIDE VASC ACCESS RIGHT  10/04/2021   Family History  Problem Relation Age of Onset  . Hypertension Mother   . Arthritis Father   . Prostate cancer Brother     Social History   Tobacco Use  . Smoking status: Former    Packs/day: 0.50    Years: 5.00    Total pack years: 2.50    Types: Cigarettes    Quit date: 1980    Years since quitting: 43.9  . Smokeless tobacco: Never  Substance Use Topics  . Alcohol use: Yes    Comment: 2 glasses wine/day prior to bowel surgery; since bowel surger 05/2019 - only 2 glasses since  Marital Status: Married   ROS  Review of Systems  Cardiovascular:  Negative for chest pain, dyspnea on exertion and leg swelling.   Objective  There were no vitals taken for this visit. There is no height or weight on file to calculate BMI.     02/11/2022   11:13 AM 10/03/2021    1:10 PM 10/03/2021   12:40 PM  Vitals with BMI  Height _0     Weight 161 lbs 3 oz    BMI 56.81    Systolic 275 170 017  Diastolic 82 74 71   Pulse 68 53 54   No data found.  Physical Exam Vitals reviewed.  Neck:     Vascular: Carotid bruit (Bilateral  soft) present. No JVD.  Cardiovascular:     Rate and Rhythm: Normal rate and regular rhythm.     Pulses: Intact distal pulses.     Heart sounds: S1 normal and S2 normal. Murmur heard.     Blowing holosystolic murmur is present with a grade of 2/6 at the apex.     Crescendo midsystolic murmur of grade 2/6 is also present.     No gallop.  Pulmonary:     Effort: Pulmonary effort is normal. No respiratory distress.     Breath sounds: Normal breath sounds. No wheezing, rhonchi or rales.  Musculoskeletal:     Right lower leg: Edema (trace ankle) present.     Left lower leg: Edema (trace ankle) present.  Neurological:     Mental Status: He is alert.   Physical exam unchanged compared to previous office visit.  Laboratory examination:   Lab Results  Component Value Date   NA 140 10/03/2021   K 3.6 10/03/2021   CO2 26 10/03/2021   GLUCOSE 88 10/03/2021   BUN 15 10/03/2021  CREATININE 1.03 10/03/2021   CALCIUM 9.3 10/03/2021   GFRNONAA >60 10/03/2021       Latest Ref Rng & Units 10/03/2021    7:17 AM  CMP  Glucose 70 - 99 mg/dL 88   BUN 8 - 23 mg/dL 15   Creatinine 0.61 - 1.24 mg/dL 1.03   Sodium 135 - 145 mmol/L 140   Potassium 3.5 - 5.1 mmol/L 3.6   Chloride 98 - 111 mmol/L 107   CO2 22 - 32 mmol/L 26   Calcium 8.9 - 10.3 mg/dL 9.3       Latest Ref Rng & Units 10/03/2021    7:17 AM  CBC  WBC 4.0 - 10.5 K/uL 5.1   Hemoglobin 13.0 - 17.0 g/dL 13.1   Hematocrit 39.0 - 52.0 % 38.9   Platelets 150 - 400 K/uL 134     External labs:    08/20/2020: Hb 13.8/HCT 40.8, platelets 160, normal indicis. Serum glucose 140 mg, BUN 20, creatinine 1.24, EGFR 60 [place], potassium 4.6. TSH normal at 0.92. Total cholesterol 186, triglycerides 73, HDL 105, LDL 68.  Allergies   Allergies  Allergen Reactions  . Escitalopram     felt weird and tired  . Lisinopril  Cough    MEDICATIONS:   Current Outpatient Medications:  .  amLODipine (NORVASC) 5 MG tablet, Take 1 tablet (5 mg total) by mouth every evening., Disp: 180 tablet, Rfl: 3 .  carbidopa-levodopa (SINEMET IR) 25-100 MG tablet, 2 at 7am, 1 at 11am, 1 at 4pm, Disp: 360 tablet, Rfl: 2 .  indomethacin (INDOCIN) 50 MG capsule, Take 50 mg by mouth 3 (three) times daily as needed for moderate pain (gout pain)., Disp: , Rfl:  .  pramipexole (MIRAPEX) 0.5 MG tablet, Take 1 tablet (0.5 mg total) by mouth 3 (three) times daily., Disp: 270 tablet, Rfl: 2 .  rosuvastatin (CRESTOR) 20 MG tablet, Take 20 mg by mouth daily. , Disp: , Rfl: 4 .  vitamin B-12 (CYANOCOBALAMIN) 500 MCG tablet, Take 500 mcg by mouth daily., Disp: , Rfl:    Radiology:   No results found.  Cardiac Studies:   PCV ECHOCARDIOGRAM COMPLETE 09/21/2020  Narrative Echocardiogram 09/21/2020: Normal LV systolic function with visual EF 60-65%. Left ventricle cavity is normal in size. Mild left ventricular hypertrophy. Normal global wall motion. Normal diastolic filling pattern, normal LAP. Left atrial cavity is mildly dilated. A lipomatous septum is present. Mild (Grade I) mitral regurgitation. Mild tricuspid regurgitation. No evidence of pulmonary hypertension. Moderate pulmonic regurgitation. No prior study for comparison.     Exercise treadmill stress test 10/05/2020: Exercise treadmill stress test performed using Bruce protocol.  Patient reached 7.5 METS, and 68% of age predicted maximum heart rate.  Exercise capacity was fair.  No chest pain reported.  Submaximal heart rate and normal blood pressure response. Stress EKG revealed no ischemic changes. Inconclusive study due to submaximal heart rate response.   Carotid artery duplex 10/09/2021: Duplex suggests stenosis in the right internal carotid artery (50-69%). Duplex suggests stenosis in the left internal carotid artery (1-15%). Antegrade right vertebral artery flow. Antegrade  left vertebral artery flow. No significant change since prior study 03/05/2021. Follow up in six months is appropriate if clinically indicated.  EKG:   *** 08/14/2021: Sinus bradycardia at a rate of 52 bpm.  Normal axis.  No evidence of ischemia or underlying injury pattern.  Compared to EKG 11/14/2020, no significant change.  Assessment   No diagnosis found.    There are no discontinued  medications.    No orders of the defined types were placed in this encounter.    No orders of the defined types were placed in this encounter.  Recommendations:   BADEN BETSCH is a 71 y.o.  fairly active Caucasian male with hypertension, Parkinson's disease which is well controlled, with an extensive evaluation for dizziness, including neurology and ENT felt to be unyielding, originally referred for cardiac evaluation of dizziness.  Patient was last seen in the office 02/13/2022 at which time he was stable from a cardiac standpoint and no changes were made.  Carotid stenosis remains stable, he will repeat surveillance in July 2023.  Physical exam and EKG are stable compared to previous office visit.  Given soft blood pressure will reduce amlodipine from 5 mg twice daily to 5 mg once daily.  Patient will monitor blood pressure notify our office if it is >140/80 mmHg.  Continue present medications otherwise.  We will schedule patient for carotid artery surveillance.  Follow-up in 6 months, sooner if needed.   Adrian Prows, PA-C 02/13/2022, 12:00 AM Office: [phone]

## 2022-02-13 ENCOUNTER — Ambulatory Visit: Payer: Medicare Other | Admitting: Cardiology

## 2022-02-13 ENCOUNTER — Encounter: Payer: Self-pay | Admitting: Cardiology

## 2022-02-13 VITALS — BP 130/70 | HR 56 | Resp 16 | Ht 68.0 in | Wt 161.8 lb

## 2022-02-13 DIAGNOSIS — I10 Essential (primary) hypertension: Secondary | ICD-10-CM

## 2022-02-13 DIAGNOSIS — I6523 Occlusion and stenosis of bilateral carotid arteries: Secondary | ICD-10-CM

## 2022-02-13 DIAGNOSIS — E7800 Pure hypercholesterolemia, unspecified: Secondary | ICD-10-CM

## 2022-02-13 DIAGNOSIS — I951 Orthostatic hypotension: Secondary | ICD-10-CM | POA: Diagnosis not present

## 2022-02-17 ENCOUNTER — Ambulatory Visit: Payer: Medicare Other | Attending: Neurology | Admitting: Physical Therapy

## 2022-02-17 ENCOUNTER — Other Ambulatory Visit: Payer: Self-pay

## 2022-02-17 ENCOUNTER — Encounter: Payer: Self-pay | Admitting: Physical Therapy

## 2022-02-17 DIAGNOSIS — R29818 Other symptoms and signs involving the nervous system: Secondary | ICD-10-CM | POA: Insufficient documentation

## 2022-02-17 DIAGNOSIS — R2689 Other abnormalities of gait and mobility: Secondary | ICD-10-CM | POA: Diagnosis not present

## 2022-02-17 DIAGNOSIS — R293 Abnormal posture: Secondary | ICD-10-CM | POA: Diagnosis not present

## 2022-02-17 DIAGNOSIS — G20A1 Parkinson's disease without dyskinesia, without mention of fluctuations: Secondary | ICD-10-CM | POA: Insufficient documentation

## 2022-02-17 DIAGNOSIS — R2681 Unsteadiness on feet: Secondary | ICD-10-CM | POA: Insufficient documentation

## 2022-02-17 NOTE — Therapy (Signed)
OUTPATIENT PHYSICAL THERAPY NEURO EVALUATION   Patient Name: Cody Dickerson MRN: [id number] DOB:09/27/1950, 71 y.o., male Today's Date: 02/17/2022   PCP: Lawerance Cruel, MD REFERRING PROVIDER: Alonza Bogus, DO  END OF SESSION:  PT End of Session - 02/17/22 1022     Visit Number 1    Number of Visits 16    Date for PT Re-Evaluation 04/11/22    Authorization Type Medicare-KX    PT Start Time 1020    PT Stop Time 1102    PT Time Calculation (min) 42 min    Equipment Utilized During Treatment --   will need gait belt due to fall risk   Activity Tolerance Patient tolerated treatment well    Behavior During Therapy WFL for tasks assessed/performed             Past Medical History:  Diagnosis Date   Balance problem    Gout    Hypercholesterolemia    Hypertension    Parkinson's disease    Small bowel obstruction ([place])    Past Surgical History:  Procedure Laterality Date   BOWEL BLOCKAGE  05/2019   IR ANGIO INTRA EXTRACRAN SEL COM CAROTID INNOMINATE BILAT MOD SED  10/03/2021   IR ANGIO VERTEBRAL SEL VERTEBRAL UNI R MOD SED  10/03/2021   IR RADIOLOGIST EVAL & MGMT  08/19/2021   IR US GUIDE VASC ACCESS RIGHT  10/04/2021   Patient Active Problem List   Diagnosis Date Noted   Parkinson's disease 02/06/2021   SBO (small bowel obstruction) ([place]) 07/06/2019    ONSET DATE: 02/11/2022 (MD referral)  REFERRING DIAG: G20.A1 (ICD-10-CM) - Parkinson's disease without dyskinesia or fluctuating manifestations   THERAPY DIAG:  Unsteadiness on feet  Other abnormalities of gait and mobility  Other symptoms and signs involving the nervous system  Abnormal posture  Rationale for Evaluation and Treatment: Rehabilitation  SUBJECTIVE:                                                                                                                                                                                             SUBJECTIVE STATEMENT: Went to see Dr. Carles Collet last week and she  recommended coming back to therapy.  I'm having tremendous problems with balance.  With getting up, I stumble.  I fell at night going to the bathroom.   Pt accompanied by: self  PERTINENT HISTORY: Parkinson's disease, ICA aneurysm (stable April 2022), MCI  PAIN:  Are you having pain? No  PRECAUTIONS: Fall  WEIGHT BEARING RESTRICTIONS: No  FALLS: Has patient fallen in last 6 months? Yes. Number of falls 2 (One fall was at  night, one fall tripped over the laundry basket)  LIVING ENVIRONMENT: Lives with: lives with their spouse Lives in: House/apartment Stairs: Yes: External: 3 steps; on right going up Has following equipment at home: None  PLOF: Independent  Exercise routine:  Walks in the neighborhood daily, but that has been limited due to weather and balance.  Does the PD spin class on Thursdays  PATIENT GOALS: To get balance better  OBJECTIVE:   DIAGNOSTIC FINDINGS: per Dr. Carles Collet note:  DaTscan previously fairly equivocal  COGNITION: Overall cognitive status: Within functional limits for tasks assessed   SENSATION: Light touch: WFL  MUSCLE TONE: LLE: Mild  POSTURE: rounded shoulders and forward head  LOWER EXTREMITY ROM:   WFL  Active  Right Eval Left Eval  Hip flexion    Hip extension    Hip abduction    Hip adduction    Hip internal rotation    Hip external rotation    Knee flexion    Knee extension    Ankle dorsiflexion    Ankle plantarflexion    Ankle inversion    Ankle eversion     (Blank rows = not tested)  LOWER EXTREMITY MMT:  Grossly tested at least 4/5 with MMT  MMT Right Eval Left Eval  Hip flexion    Hip extension    Hip abduction    Hip adduction    Hip internal rotation    Hip external rotation    Knee flexion    Knee extension    Ankle dorsiflexion    Ankle plantarflexion    Ankle inversion    Ankle eversion    (Blank rows = not tested)    TRANSFERS: Assistive device utilized: None  Sit to stand: Modified  independence Stand to sit: Modified independence  GAIT: Gait pattern: step through pattern, decreased arm swing- Left, and decreased step length- Left  Holds LUE flexed at elbow and gently fisted with gait during testing for eval; able to relax for arm swing when cued to do so. Distance walked: 50 ft x 4 Assistive device utilized: None Level of assistance: Modified independence Comments: 8.31 sec in 10 M walk:3.95 ft/sec  FUNCTIONAL TESTS:  5 times sit to stand: 13.44 sec without UE support Timed up and go (TUG): 10.56 MiniBESTest: 19/28 (scores <22/28 indicate increased fall risk)  TUG cognitive:  11.59 sec (holds hands together in front of body) TUG manual:  10.63 (holds LUE close to body)  TODAY'S TREATMENT:                                                                                                                              DATE: 02/17/2022    PATIENT EDUCATION: Education details: PT eval results, POC; HE initiated Person educated: Patient Education method: Explanation, Demonstration, and Handouts Education comprehension: verbalized understanding and returned demonstration  HOME EXERCISE PROGRAM: Access Code: [email or web address] URL: [email or web address] Date: 02/17/2022 Prepared by: [facility]  Program Notes Make sure to walk with TALL posture and relaxed ARM SWING  Exercises - Heel Toe Raises with Counter Support  - 1 x daily - 7 x weekly - 2 sets - 10 reps - 3 sec hold - Staggered Stance Forward Backward Weight Shift with Counter Support  - 1 x daily - 7 x weekly - 2 sets - 10 reps  GOALS: Goals reviewed with patient? Yes  SHORT TERM GOALS: Target date: 03/14/2022  Pt will be independent with HEP for improved balance, gait. Baseline: Goal status: INITIAL  2.  Pt will improve 5x sit<>stand to less than or equal to 12.5 sec to demonstrate improved functional strength and transfer efficiency. Baseline: 13.44  sec Goal status: INITIAL  3.  Pt will verbalize understanding of fall prevention in home environment. Baseline:  Goal status: INITIAL   LONG TERM GOALS: Target date: 04/11/2022  Pt will be independent with HEP for improved balance, gait. Baseline:  Goal status: INITIAL  2.  Pt will improve MiniBESTest score to at least 22/28 to decrease fall risk. Baseline: 19/28 Goal status: INITIAL  3.  Pt will verbalize plans for continued community fitness upon d/c from PT. Baseline:  Goal status: INITIAL   ASSESSMENT:  CLINICAL IMPRESSION: Patient is a 71 y.o. male who was seen today for physical therapy evaluation and treatment for Parkinson's disease.   He recently saw Dr. Carles Collet and based on discussion about his balance, Dr. Carles Collet referred him back to physical therapy.  He presents today with decreased timing and coordination of gait, decreased balance, decreased functional strength.  He has posterior LOB at times and has increased difficulty with balance with vision removed, on compliant surfaces and with push and release testing, not wanting to move out of his BOS.  He has had 2 falls in past 6 months; he is at fall risk per 5x sit<>stand and MiniBESTest scores.  He will benefit from skilled PT to address the above stated deficits to decrease fall risk and to improve overall functional mobility.  OBJECTIVE IMPAIRMENTS: Abnormal gait, decreased balance, decreased coordination, decreased knowledge of use of DME, decreased mobility, difficulty walking, decreased strength, and postural dysfunction.   ACTIVITY LIMITATIONS: bending, standing, squatting, transfers, and locomotion level  PARTICIPATION LIMITATIONS: community activity and yard work  PERSONAL FACTORS: 3+ comorbidities: See PMH above  are also affecting patient's functional outcome.   REHAB POTENTIAL: Good  CLINICAL DECISION MAKING: Evolving/moderate complexity  EVALUATION COMPLEXITY: Moderate  PLAN:  PT FREQUENCY: 2x/week  PT  DURATION: 8 weeks including eval week  PLANNED INTERVENTIONS: Therapeutic exercises, Therapeutic activity, Neuromuscular re-education, Balance training, Gait training, Patient/Family education, Self Care, and DME instructions  PLAN FOR NEXT SESSION: Review HEP and progress for balance, large amplitude movement patterns, compliant surfaces; gait with arm swing, step length, posture; may consider trial of cane.  Assess orthostatic vitals.   Cari Burgo W., PT 02/17/2022, 11:51 AM

## 2022-02-20 ENCOUNTER — Encounter: Payer: Self-pay | Admitting: Physical Therapy

## 2022-02-20 ENCOUNTER — Ambulatory Visit: Payer: Medicare Other | Admitting: Physical Therapy

## 2022-02-20 DIAGNOSIS — R2689 Other abnormalities of gait and mobility: Secondary | ICD-10-CM

## 2022-02-20 DIAGNOSIS — R29818 Other symptoms and signs involving the nervous system: Secondary | ICD-10-CM | POA: Diagnosis not present

## 2022-02-20 DIAGNOSIS — R293 Abnormal posture: Secondary | ICD-10-CM | POA: Diagnosis not present

## 2022-02-20 DIAGNOSIS — R2681 Unsteadiness on feet: Secondary | ICD-10-CM | POA: Diagnosis not present

## 2022-02-20 DIAGNOSIS — G20A1 Parkinson's disease without dyskinesia, without mention of fluctuations: Secondary | ICD-10-CM | POA: Diagnosis not present

## 2022-02-20 NOTE — Therapy (Signed)
OUTPATIENT PHYSICAL THERAPY NEURO TREATMENT NOTE   Patient Name: Cody Dickerson MRN: [id number] DOB:Dec 29, 1950, 71 y.o., male Today's Date: 02/20/2022   PCP: Lawerance Cruel, MD REFERRING PROVIDER: Alonza Bogus, DO  END OF SESSION:  PT End of Session - 02/20/22 1021     Visit Number 2    Number of Visits 16    Date for PT Re-Evaluation 04/11/22    Authorization Type Medicare-KX    PT Start Time 1021    PT Stop Time 1100    PT Time Calculation (min) 39 min    Equipment Utilized During Treatment --   will need gait belt due to fall risk   Activity Tolerance Patient tolerated treatment well    Behavior During Therapy WFL for tasks assessed/performed             Past Medical History:  Diagnosis Date   Balance problem    Gout    Hypercholesterolemia    Hypertension    Parkinson's disease    Small bowel obstruction ([place])    Past Surgical History:  Procedure Laterality Date   BOWEL BLOCKAGE  05/2019   IR ANGIO INTRA EXTRACRAN SEL COM CAROTID INNOMINATE BILAT MOD SED  10/03/2021   IR ANGIO VERTEBRAL SEL VERTEBRAL UNI R MOD SED  10/03/2021   IR RADIOLOGIST EVAL & MGMT  08/19/2021   IR US GUIDE VASC ACCESS RIGHT  10/04/2021   Patient Active Problem List   Diagnosis Date Noted   Parkinson's disease 02/06/2021   SBO (small bowel obstruction) ([place]) 07/06/2019    ONSET DATE: 02/11/2022 (MD referral)  REFERRING DIAG: G20.A1 (ICD-10-CM) - Parkinson's disease without dyskinesia or fluctuating manifestations   THERAPY DIAG:  Unsteadiness on feet  Other abnormalities of gait and mobility  Rationale for Evaluation and Treatment: Rehabilitation  SUBJECTIVE:                                                                                                                                                                                             SUBJECTIVE STATEMENT: Dr. Einar Gip monitoring BP and has made some changes in the medications.  Ordered the compression stockings but  they haven't come in yet.     Pt accompanied by: self  PERTINENT HISTORY: Parkinson's disease, ICA aneurysm (stable April 2022), MCI  PAIN:  Are you having pain? No  PRECAUTIONS: Fall  WEIGHT BEARING RESTRICTIONS: No  FALLS: Has patient fallen in last 6 months? Yes. Number of falls 2 (One fall was at night, one fall tripped over the laundry basket)  LIVING ENVIRONMENT: Lives with: lives with their spouse Lives in: House/apartment Stairs: Yes:  External: 3 steps; on right going up Has following equipment at home: None  PLOF: Independent  Exercise routine:  Walks in the neighborhood daily, but that has been limited due to weather and balance.  Does the PD spin class on Thursdays  PATIENT GOALS: To get balance better  OBJECTIVE:    TODAY'S TREATMENT: 02/20/2022 Activity Comments  Reviewed initial HEP: -heel/toe raises -stagger stance rocking back and forward Good return demo understanding, with min cues for stagger stance weightshifting  Standing PWR! Moves: -PWR! Up for posture -PWR! Rock for [place]! Twist for trunk rotation -PWR! Step for step initiation Mild posterior LOB several times; cues for widened BOS  Step strategy work: -forward x 10 over obstacle -back x 10 -side step x 10 over obstacle -forward/back step and weightshift x 10 Tactile cues for increased weightshift  through hips  Sit<>stand x 5 reps Cues for wider BOS  Sit<>stand with short distance gait/turn/return to sit Cues for wide BOS upon standing, brief pause to weightshift and start with BIG steps      PATIENT EDUCATION: Education details: HEP addition-PWR! Moves in standing (already has from previous bout of therapy, but is not doing).  Rationale for PWR! Moves and relation to functional activities Person educated: Patient Education method: Explanation, Demonstration, Verbal cues, and Handouts Education comprehension: verbalized understanding, returned demonstration, and needs further  education  Access Code: [email or web address] URL: [email or web address] Date: 02/17/2022 Prepared by: [facility]  Program Notes Make sure to walk with TALL posture and relaxed ARM SWING  Exercises - Heel Toe Raises with Counter Support  - 1 x daily - 7 x weekly - 2 sets - 10 reps - 3 sec hold - Staggered Stance Forward Backward Weight Shift with Counter Support  - 1 x daily - 7 x weekly - 2 sets - 10 reps *Additions 02/20/2022:  PWR! Moves in standing, 10-20 reps, 1x/daily at counter  ---------------------------------------------------------------------------------- Objective measures below taken at initial evaluation:  DIAGNOSTIC FINDINGS: per Dr. Carles Collet note:  DaTscan previously fairly equivocal  COGNITION: Overall cognitive status: Within functional limits for tasks assessed   SENSATION: Light touch: WFL  MUSCLE TONE: LLE: Mild  POSTURE: rounded shoulders and forward head  LOWER EXTREMITY ROM:   WFL  Active  Right Eval Left Eval  Hip flexion    Hip extension    Hip abduction    Hip adduction    Hip internal rotation    Hip external rotation    Knee flexion    Knee extension    Ankle dorsiflexion    Ankle plantarflexion    Ankle inversion    Ankle eversion     (Blank rows = not tested)  LOWER EXTREMITY MMT:  Grossly tested at least 4/5 with MMT  MMT Right Eval Left Eval  Hip flexion    Hip extension    Hip abduction    Hip adduction    Hip internal rotation    Hip external rotation    Knee flexion    Knee extension    Ankle dorsiflexion    Ankle plantarflexion    Ankle inversion    Ankle eversion    (Blank rows = not tested)    TRANSFERS: Assistive device utilized: None  Sit to stand: Modified independence Stand to sit: Modified independence  GAIT: Gait pattern: step through pattern, decreased arm swing- Left, and decreased step length- Left  Holds LUE flexed at elbow and gently fisted with gait during  testing for eval; able to relax for arm swing when cued to do so. Distance walked: 50 ft x 4 Assistive device utilized: None Level of assistance: Modified independence Comments: 8.31 sec in 10 M walk:3.95 ft/sec  FUNCTIONAL TESTS:  5 times sit to stand: 13.44 sec without UE support Timed up and go (TUG): 10.56 MiniBESTest: 19/28 (scores <22/28 indicate increased fall risk)  TUG cognitive:  11.59 sec (holds hands together in front of body) TUG manual:  10.63 (holds LUE close to body)  TODAY'S TREATMENT:                                                                                                                              DATE: 02/17/2022    PATIENT EDUCATION: Education details: PT eval results, POC; HE initiated Person educated: Patient Education method: Explanation, Demonstration, and Handouts Education comprehension: verbalized understanding and returned demonstration  HOME EXERCISE PROGRAM: Access Code: [email or web address] URL: [email or web address] Date: 02/17/2022 Prepared by: [facility]  Program Notes Make sure to walk with TALL posture and relaxed ARM SWING  Exercises - Heel Toe Raises with Counter Support  - 1 x daily - 7 x weekly - 2 sets - 10 reps - 3 sec hold - Staggered Stance Forward Backward Weight Shift with Counter Support  - 1 x daily - 7 x weekly - 2 sets - 10 reps  GOALS: Goals reviewed with patient? Yes  SHORT TERM GOALS: Target date: 03/14/2022  Pt will be independent with HEP for improved balance, gait. Baseline: Goal status: IN PROGRESS  2.  Pt will improve 5x sit<>stand to less than or equal to 12.5 sec to demonstrate improved functional strength and transfer efficiency. Baseline: 13.44 sec Goal status: IN PROGRESS  3.  Pt will verbalize understanding of fall prevention in home environment. Baseline:  Goal status: IN PROGRESS   LONG TERM GOALS: Target date: 04/11/2022  Pt will be independent with  HEP for improved balance, gait. Baseline:  Goal status: IN PROGRESS  2.  Pt will improve MiniBESTest score to at least 22/28 to decrease fall risk. Baseline: 19/28 Goal status: IN PROGRESS  3.  Pt will verbalize plans for continued community fitness upon d/c from PT. Baseline:  Goal status: IN PROGRESS   ASSESSMENT:  CLINICAL IMPRESSION: Skilled PT session today focused on weightshifting, step strategy work and posture for improved balance.  Pt reports at home, he notes unsteadiness upon standing and initiation of gait.  Focused on repeated sit<>stand, PWR! Moves in standing, with cues for wider BOS and slowed pace to transition between movements to help with improved initiation of gait.  Pt does tend to keep narrower BOS and he has several instances of posterior bias/posterior LOB, able to regain on his own.  He will continue to benefit from skilled PT towards goals for improved overall balance and decreased fall risk.  OBJECTIVE IMPAIRMENTS: Abnormal gait, decreased balance, decreased coordination, decreased knowledge  of use of DME, decreased mobility, difficulty walking, decreased strength, and postural dysfunction.   ACTIVITY LIMITATIONS: bending, standing, squatting, transfers, and locomotion level  PARTICIPATION LIMITATIONS: community activity and yard work  PERSONAL FACTORS: 3+ comorbidities: See PMH above  are also affecting patient's functional outcome.   REHAB POTENTIAL: Good  CLINICAL DECISION MAKING: Evolving/moderate complexity  EVALUATION COMPLEXITY: Moderate  PLAN:  PT FREQUENCY: 2x/week  PT DURATION: 8 weeks including eval week  PLANNED INTERVENTIONS: Therapeutic exercises, Therapeutic activity, Neuromuscular re-education, Balance training, Gait training, Patient/Family education, Self Care, and DME instructions  PLAN FOR NEXT SESSION: Review PWR! Moves addition to HEP and progress for balance, large amplitude movement patterns, compliant surfaces; gait with  arm swing, step length, posture; may consider trial of cane.  *Use gait belt  Salmaan Patchin W., PT 02/20/2022, 11:13 AM   [facility] [place], [place], [place] [address] Phone # [phone] Fax # [phone]

## 2022-02-26 ENCOUNTER — Ambulatory Visit: Payer: Medicare Other | Admitting: Physical Therapy

## 2022-02-27 ENCOUNTER — Ambulatory Visit: Payer: Medicare Other | Admitting: Physical Therapy

## 2022-02-27 ENCOUNTER — Encounter: Payer: Self-pay | Admitting: Physical Therapy

## 2022-02-27 DIAGNOSIS — R2689 Other abnormalities of gait and mobility: Secondary | ICD-10-CM | POA: Diagnosis not present

## 2022-02-27 DIAGNOSIS — R293 Abnormal posture: Secondary | ICD-10-CM | POA: Diagnosis not present

## 2022-02-27 DIAGNOSIS — R2681 Unsteadiness on feet: Secondary | ICD-10-CM

## 2022-02-27 DIAGNOSIS — G20A1 Parkinson's disease without dyskinesia, without mention of fluctuations: Secondary | ICD-10-CM | POA: Diagnosis not present

## 2022-02-27 DIAGNOSIS — R29818 Other symptoms and signs involving the nervous system: Secondary | ICD-10-CM | POA: Diagnosis not present

## 2022-02-27 NOTE — Therapy (Signed)
OUTPATIENT PHYSICAL THERAPY NEURO TREATMENT NOTE   Patient Name: Cody Dickerson MRN: [id number] DOB:17-Oct-1950, 71 y.o., male Today's Date: 02/27/2022   PCP: Lawerance Cruel, MD REFERRING PROVIDER: Alonza Bogus, DO  END OF SESSION:  PT End of Session - 02/27/22 1059     Visit Number 3    Number of Visits 16    Date for PT Re-Evaluation 04/11/22    Authorization Type Medicare-KX    PT Start Time 1102    PT Stop Time 1129   Requests to leave early for another appointment   PT Time Calculation (min) 27 min    Equipment Utilized During Treatment Gait belt   will need gait belt due to fall risk   Activity Tolerance Patient tolerated treatment well    Behavior During Therapy WFL for tasks assessed/performed             Past Medical History:  Diagnosis Date   Balance problem    Gout    Hypercholesterolemia    Hypertension    Parkinson's disease    Small bowel obstruction ([place])    Past Surgical History:  Procedure Laterality Date   BOWEL BLOCKAGE  05/2019   IR ANGIO INTRA EXTRACRAN SEL COM CAROTID INNOMINATE BILAT MOD SED  10/03/2021   IR ANGIO VERTEBRAL SEL VERTEBRAL UNI R MOD SED  10/03/2021   IR RADIOLOGIST EVAL & MGMT  08/19/2021   IR US GUIDE VASC ACCESS RIGHT  10/04/2021   Patient Active Problem List   Diagnosis Date Noted   Parkinson's disease 02/06/2021   SBO (small bowel obstruction) ([place]) 07/06/2019    ONSET DATE: 02/11/2022 (MD referral)  REFERRING DIAG: G20.A1 (ICD-10-CM) - Parkinson's disease without dyskinesia or fluctuating manifestations   THERAPY DIAG:  Unsteadiness on feet  Other abnormalities of gait and mobility  Other symptoms and signs involving the nervous system  Abnormal posture  Rationale for Evaluation and Treatment: Rehabilitation  SUBJECTIVE:                                                                                                                                                                                              SUBJECTIVE STATEMENT: Need to leave a little early today to get to spin class.  No falls.   Did a few of my exercise classes. Pt accompanied by: self  PERTINENT HISTORY: Parkinson's disease, ICA aneurysm (stable April 2022), MCI  PAIN:  Are you having pain? No  PRECAUTIONS: Fall  WEIGHT BEARING RESTRICTIONS: No  FALLS: Has patient fallen in last 6 months? Yes. Number of falls 2 (One fall was at night, one fall tripped  over the laundry basket)  LIVING ENVIRONMENT: Lives with: lives with their spouse Lives in: House/apartment Stairs: Yes: External: 3 steps; on right going up Has following equipment at home: None  PLOF: Independent  Exercise routine:  Walks in the neighborhood daily, but that has been limited due to weather and balance.  Does the PD spin class on Thursdays  PATIENT GOALS: To get balance better  OBJECTIVE:    TODAY'S TREATMENT: 02/27/2022 Activity Comments  Standing PWR! Moves 20 reps: -PWR! Up for posture -PWR! Rock for [place]! Twist for trunk rotation -PWR! Step for step initiation Good demo; cues for increased effort and intensity  stagger stance rocking back and forward Better ability for weightshifting on R side  Step strategy work: -back step and weightshift 2 x 10 reps -forward/back step and weightshift x 10   Forward step taps to 6" step, then posterior step and weightshift x 10 reps   Forward step up/up, down/down x 10 reps, each leg leading Difficulty coordinating proper sequence  Sit<>stand with overhead lift, holding yellow weighted ball     Access Code: [email or web address] URL: [email or web address] Date: 02/17/2022 Prepared by: [facility]  Program Notes Make sure to walk with TALL posture and relaxed ARM SWING  Exercises - Heel Toe Raises with Counter Support  - 1 x daily - 7 x weekly - 2 sets - 10 reps - 3 sec hold - Staggered Stance Forward Backward Weight Shift with Counter  Support  - 1 x daily - 7 x weekly - 2 sets - 10 reps *Additions 02/20/2022:  PWR! Moves in standing, 10-20 reps, 1x/daily at counter  ---------------------------------------------------------------------------------- Objective measures below taken at initial evaluation:  DIAGNOSTIC FINDINGS: per Dr. Carles Collet note:  DaTscan previously fairly equivocal  COGNITION: Overall cognitive status: Within functional limits for tasks assessed   SENSATION: Light touch: WFL  MUSCLE TONE: LLE: Mild  POSTURE: rounded shoulders and forward head  LOWER EXTREMITY ROM:   WFL  Active  Right Eval Left Eval  Hip flexion    Hip extension    Hip abduction    Hip adduction    Hip internal rotation    Hip external rotation    Knee flexion    Knee extension    Ankle dorsiflexion    Ankle plantarflexion    Ankle inversion    Ankle eversion     (Blank rows = not tested)  LOWER EXTREMITY MMT:  Grossly tested at least 4/5 with MMT  MMT Right Eval Left Eval  Hip flexion    Hip extension    Hip abduction    Hip adduction    Hip internal rotation    Hip external rotation    Knee flexion    Knee extension    Ankle dorsiflexion    Ankle plantarflexion    Ankle inversion    Ankle eversion    (Blank rows = not tested)    TRANSFERS: Assistive device utilized: None  Sit to stand: Modified independence Stand to sit: Modified independence  GAIT: Gait pattern: step through pattern, decreased arm swing- Left, and decreased step length- Left  Holds LUE flexed at elbow and gently fisted with gait during testing for eval; able to relax for arm swing when cued to do so. Distance walked: 50 ft x 4 Assistive device utilized: None Level of assistance: Modified independence Comments: 8.31 sec in 10 M walk:3.95 ft/sec  FUNCTIONAL TESTS:  5 times sit to stand: 13.44 sec without  UE support Timed up and go (TUG): 10.56 MiniBESTest: 19/28 (scores <22/28 indicate increased fall risk)  TUG cognitive:  11.59  sec (holds hands together in front of body) TUG manual:  10.63 (holds LUE close to body)  TODAY'S TREATMENT:                                                                                                                              DATE: 02/17/2022    PATIENT EDUCATION: Education details: PT eval results, POC; HE initiated Person educated: Patient Education method: Explanation, Demonstration, and Handouts Education comprehension: verbalized understanding and returned demonstration  HOME EXERCISE PROGRAM: Access Code: [email or web address] URL: [email or web address] Date: 02/17/2022 Prepared by: [facility]  Program Notes Make sure to walk with TALL posture and relaxed ARM SWING  Exercises - Heel Toe Raises with Counter Support  - 1 x daily - 7 x weekly - 2 sets - 10 reps - 3 sec hold - Staggered Stance Forward Backward Weight Shift with Counter Support  - 1 x daily - 7 x weekly - 2 sets - 10 reps  GOALS: Goals reviewed with patient? Yes  SHORT TERM GOALS: Target date: 03/14/2022  Pt will be independent with HEP for improved balance, gait. Baseline: Goal status: IN PROGRESS  2.  Pt will improve 5x sit<>stand to less than or equal to 12.5 sec to demonstrate improved functional strength and transfer efficiency. Baseline: 13.44 sec Goal status: IN PROGRESS  3.  Pt will verbalize understanding of fall prevention in home environment. Baseline:  Goal status: IN PROGRESS   LONG TERM GOALS: Target date: 04/11/2022  Pt will be independent with HEP for improved balance, gait. Baseline:  Goal status: IN PROGRESS  2.  Pt will improve MiniBESTest score to at least 22/28 to decrease fall risk. Baseline: 19/28 Goal status: IN PROGRESS  3.  Pt will verbalize plans for continued community fitness upon d/c from PT. Baseline:  Goal status: IN PROGRESS   ASSESSMENT:  CLINICAL IMPRESSION: Skilled PT session today continues to focus on  weigthshifting and balance, large amplitude movement patterns with review of PWR! Moves.  Pt appears to have slightly better ability to coordinate weightshifting activities, but has difficulty with coordination and sequencing of step up activity.  Pt also needs cues for posture, looking ahead at visual target for improved posture.  He will continue to benefit from skilled PT towards goals for improved overall balance and decreased fall risk.  OBJECTIVE IMPAIRMENTS: Abnormal gait, decreased balance, decreased coordination, decreased knowledge of use of DME, decreased mobility, difficulty walking, decreased strength, and postural dysfunction.   ACTIVITY LIMITATIONS: bending, standing, squatting, transfers, and locomotion level  PARTICIPATION LIMITATIONS: community activity and yard work  PERSONAL FACTORS: 3+ comorbidities: See PMH above  are also affecting patient's functional outcome.   REHAB POTENTIAL: Good  CLINICAL DECISION MAKING: Evolving/moderate complexity  EVALUATION COMPLEXITY: Moderate  PLAN:  PT FREQUENCY: 2x/week  PT  DURATION: 8 weeks including eval week  PLANNED INTERVENTIONS: Therapeutic exercises, Therapeutic activity, Neuromuscular re-education, Balance training, Gait training, Patient/Family education, Self Care, and DME instructions  PLAN FOR NEXT SESSION: Review PWR! Moves as needed and progress for balance, large amplitude movement patterns, compliant surfaces; gait with arm swing, step length, posture; may consider trial of cane.   Frazier Butt., PT 02/27/2022, 11:42 AM   [facility] at [facility] [place], [place], [place] [address] Phone # [phone] Fax # [phone]

## 2022-03-05 ENCOUNTER — Encounter: Payer: Self-pay | Admitting: Physical Therapy

## 2022-03-05 ENCOUNTER — Ambulatory Visit: Payer: Medicare Other | Attending: Neurology | Admitting: Physical Therapy

## 2022-03-05 DIAGNOSIS — R293 Abnormal posture: Secondary | ICD-10-CM | POA: Insufficient documentation

## 2022-03-05 DIAGNOSIS — R2681 Unsteadiness on feet: Secondary | ICD-10-CM | POA: Diagnosis not present

## 2022-03-05 DIAGNOSIS — R2689 Other abnormalities of gait and mobility: Secondary | ICD-10-CM

## 2022-03-05 DIAGNOSIS — R29818 Other symptoms and signs involving the nervous system: Secondary | ICD-10-CM | POA: Diagnosis not present

## 2022-03-05 NOTE — Therapy (Signed)
OUTPATIENT PHYSICAL THERAPY NEURO TREATMENT NOTE   Patient Name: Cody Dickerson MRN: [id number] DOB:09/09/1950, 72 y.o., male Today's Date: 03/05/2022   PCP: Lawerance Cruel, MD REFERRING PROVIDER: Alonza Bogus, DO  END OF SESSION:  PT End of Session - 03/05/22 1450     Visit Number 4    Number of Visits 16    Date for PT Re-Evaluation 04/11/22    Authorization Type Medicare    PT Start Time 1450    PT Stop Time 1528    PT Time Calculation (min) 38 min    Equipment Utilized During Treatment Gait belt   will need gait belt due to fall risk   Activity Tolerance Patient tolerated treatment well    Behavior During Therapy WFL for tasks assessed/performed             Past Medical History:  Diagnosis Date   Balance problem    Gout    Hypercholesterolemia    Hypertension    Parkinson's disease    Small bowel obstruction ([place])    Past Surgical History:  Procedure Laterality Date   BOWEL BLOCKAGE  05/2019   IR ANGIO INTRA EXTRACRAN SEL COM CAROTID INNOMINATE BILAT MOD SED  10/03/2021   IR ANGIO VERTEBRAL SEL VERTEBRAL UNI R MOD SED  10/03/2021   IR RADIOLOGIST EVAL & MGMT  08/19/2021   IR US GUIDE VASC ACCESS RIGHT  10/04/2021   Patient Active Problem List   Diagnosis Date Noted   Parkinson's disease 02/06/2021   SBO (small bowel obstruction) ([place]) 07/06/2019    ONSET DATE: 02/11/2022 (MD referral)  REFERRING DIAG: G20.A1 (ICD-10-CM) - Parkinson's disease without dyskinesia or fluctuating manifestations   THERAPY DIAG:  Unsteadiness on feet  Other abnormalities of gait and mobility  Other symptoms and signs involving the nervous system  Rationale for Evaluation and Treatment: Rehabilitation  SUBJECTIVE:                                                                                                                                                                                             SUBJECTIVE STATEMENT: Nothing new except the year.  Did the spin class last  week and it went well.  Having a little trouble with the weightshifting exercise. Pt accompanied by: self  PERTINENT HISTORY: Parkinson's disease, ICA aneurysm (stable April 2022), MCI  PAIN:  Are you having pain? No  PRECAUTIONS: Fall  WEIGHT BEARING RESTRICTIONS: No  FALLS: Has patient fallen in last 6 months? Yes. Number of falls 2 (One fall was at night, one fall tripped over the laundry basket)  LIVING ENVIRONMENT: Lives with: lives  with their spouse Lives in: House/apartment Stairs: Yes: External: 3 steps; on right going up Has following equipment at home: None  PLOF: Independent  Exercise routine:  Walks in the neighborhood daily, but that has been limited due to weather and balance.  Does the PD spin class on Thursdays  PATIENT GOALS: To get balance better  OBJECTIVE:    TODAY'S TREATMENT: 03/05/2022 Activity Comments  Standing PWR! Moves 20 reps: -PWR! Up for posture -PWR! Rock for [place]! Twist for trunk rotation -PWR! Step for step initiation Needs cues for wider BOS and for weightshifting  stagger stance rocking back and forward 2 x 10 reps Cues for adequate weightshift; 2nd set with added arm swing  Side step over obstacles, 2 x 10 reps BUE support, initial cue for foot clearance  Forward step over obstacle 2 x 10 reps   Forward/back step and weightshift x 10 reps Cues for step length and weightshift  Forward step ups, 6" step, 2 x 10 reps each leg (2nd set is step tap to top step) Light BUE support  Standing on foam:  marching in place 2 x 10, heel/toe raises 2 x 10 reps; feet apart with EC 3 x 30"; forward step taps x 10   Heel raises x 15 reps, 3 sec    PATIENT EDUCATION: Education details: Benefits of PWR! Moves Classes Person educated: Patient Education method: Explanation Education comprehension: verbalized understanding   Access Code: [email or web address] URL: [email or web address] Date: 02/17/2022 Prepared by: [facility]  Program Notes Make sure to walk with TALL posture and relaxed ARM SWING  Exercises - Heel Toe Raises with Counter Support  - 1 x daily - 7 x weekly - 2 sets - 10 reps - 3 sec hold - Staggered Stance Forward Backward Weight Shift with Counter Support  - 1 x daily - 7 x weekly - 2 sets - 10 reps *Additions 02/20/2022:  PWR! Moves in standing, 10-20 reps, 1x/daily at counter  ---------------------------------------------------------------------------------- Objective measures below taken at initial evaluation:  DIAGNOSTIC FINDINGS: per Dr. Carles Collet note:  DaTscan previously fairly equivocal  COGNITION: Overall cognitive status: Within functional limits for tasks assessed   SENSATION: Light touch: WFL  MUSCLE TONE: LLE: Mild  POSTURE: rounded shoulders and forward head  LOWER EXTREMITY ROM:   WFL  Active  Right Eval Left Eval  Hip flexion    Hip extension    Hip abduction    Hip adduction    Hip internal rotation    Hip external rotation    Knee flexion    Knee extension    Ankle dorsiflexion    Ankle plantarflexion    Ankle inversion    Ankle eversion     (Blank rows = not tested)  LOWER EXTREMITY MMT:  Grossly tested at least 4/5 with MMT  MMT Right Eval Left Eval  Hip flexion    Hip extension    Hip abduction    Hip adduction    Hip internal rotation    Hip external rotation    Knee flexion    Knee extension    Ankle dorsiflexion    Ankle plantarflexion    Ankle inversion    Ankle eversion    (Blank rows = not tested)    TRANSFERS: Assistive device utilized: None  Sit to stand: Modified independence Stand to sit: Modified independence  GAIT: Gait pattern: step through pattern, decreased arm swing- Left, and decreased step length- Left  Holds  LUE flexed at elbow and gently fisted with gait during testing for eval; able to relax for arm swing when cued to do so. Distance walked: 50 ft x 4 Assistive device utilized:  None Level of assistance: Modified independence Comments: 8.31 sec in 10 M walk:3.95 ft/sec  FUNCTIONAL TESTS:  5 times sit to stand: 13.44 sec without UE support Timed up and go (TUG): 10.56 MiniBESTest: 19/28 (scores <22/28 indicate increased fall risk)  TUG cognitive:  11.59 sec (holds hands together in front of body) TUG manual:  10.63 (holds LUE close to body)  TODAY'S TREATMENT:                                                                                                                              DATE: 02/17/2022    PATIENT EDUCATION: Education details: PT eval results, POC; HE initiated Person educated: Patient Education method: Explanation, Demonstration, and Handouts Education comprehension: verbalized understanding and returned demonstration  HOME EXERCISE PROGRAM: Access Code: [email or web address] URL: [email or web address] Date: 02/17/2022 Prepared by: [facility]  Program Notes Make sure to walk with TALL posture and relaxed ARM SWING  Exercises - Heel Toe Raises with Counter Support  - 1 x daily - 7 x weekly - 2 sets - 10 reps - 3 sec hold - Staggered Stance Forward Backward Weight Shift with Counter Support  - 1 x daily - 7 x weekly - 2 sets - 10 reps  GOALS: Goals reviewed with patient? Yes  SHORT TERM GOALS: Target date: 03/14/2022  Pt will be independent with HEP for improved balance, gait. Baseline: Goal status: IN PROGRESS  2.  Pt will improve 5x sit<>stand to less than or equal to 12.5 sec to demonstrate improved functional strength and transfer efficiency. Baseline: 13.44 sec Goal status: IN PROGRESS  3.  Pt will verbalize understanding of fall prevention in home environment. Baseline:  Goal status: IN PROGRESS   LONG TERM GOALS: Target date: 04/11/2022  Pt will be independent with HEP for improved balance, gait. Baseline:  Goal status: IN PROGRESS  2.  Pt will improve MiniBESTest score to at  least 22/28 to decrease fall risk. Baseline: 19/28 Goal status: IN PROGRESS  3.  Pt will verbalize plans for continued community fitness upon d/c from PT. Baseline:  Goal status: IN PROGRESS   ASSESSMENT:  CLINICAL IMPRESSION: Continued to focus skilled PT session today on weigthshifting and balance, step strategy work, including larger amplitude movement patterns stepping over obstacles. Also addressed compliant surface balance, with pt needing UE support for work on compliant surfaces.  He continues to have some difficulty with sequencing weightshifting activity in staggered stance ant/post position.  He will continue to benefit from skilled PT to work towards goals for improved balance and mobility.   OBJECTIVE IMPAIRMENTS: Abnormal gait, decreased balance, decreased coordination, decreased knowledge of use of DME, decreased mobility, difficulty walking, decreased strength, and postural dysfunction.   ACTIVITY  LIMITATIONS: bending, standing, squatting, transfers, and locomotion level  PARTICIPATION LIMITATIONS: community activity and yard work  PERSONAL FACTORS: 3+ comorbidities: See PMH above  are also affecting patient's functional outcome.   REHAB POTENTIAL: Good  CLINICAL DECISION MAKING: Evolving/moderate complexity  EVALUATION COMPLEXITY: Moderate  PLAN:  PT FREQUENCY: 2x/week  PT DURATION: 8 weeks including eval week  PLANNED INTERVENTIONS: Therapeutic exercises, Therapeutic activity, Neuromuscular re-education, Balance training, Gait training, Patient/Family education, Self Care, and DME instructions  PLAN FOR NEXT SESSION: Work on compliant surfaces and consider adding to HEP. Also could address heel/toe raises/step strategy.  Large amplitude movement patterns, gait with arm swing, step length, posture; may consider trial of cane.   Frazier Butt., PT 03/05/2022, 5:03 PM   [facility] at [facility] [place], [place], [place] [address] Phone # [phone] Fax # [phone]

## 2022-03-10 ENCOUNTER — Encounter: Payer: Self-pay | Admitting: Physical Therapy

## 2022-03-10 ENCOUNTER — Ambulatory Visit: Payer: Medicare Other | Admitting: Physical Therapy

## 2022-03-10 DIAGNOSIS — R293 Abnormal posture: Secondary | ICD-10-CM | POA: Diagnosis not present

## 2022-03-10 DIAGNOSIS — R29818 Other symptoms and signs involving the nervous system: Secondary | ICD-10-CM | POA: Diagnosis not present

## 2022-03-10 DIAGNOSIS — R2689 Other abnormalities of gait and mobility: Secondary | ICD-10-CM | POA: Diagnosis not present

## 2022-03-10 DIAGNOSIS — R2681 Unsteadiness on feet: Secondary | ICD-10-CM | POA: Diagnosis not present

## 2022-03-10 NOTE — Therapy (Signed)
OUTPATIENT PHYSICAL THERAPY NEURO TREATMENT NOTE   Patient Name: Cody Dickerson MRN: [id number] DOB:[birth year], 72 y.o., male Today's Date: 03/10/2022   PCP: Lawerance Cruel, MD REFERRING PROVIDER: Alonza Bogus, DO  END OF SESSION:  PT End of Session - 03/10/22 1226     Visit Number 5    Number of Visits 16    Date for PT Re-Evaluation 04/11/22    Authorization Type Medicare    PT Start Time 1233    PT Stop Time 1313    PT Time Calculation (min) 40 min    Equipment Utilized During Treatment Gait belt   will need gait belt due to fall risk   Activity Tolerance Patient tolerated treatment well    Behavior During Therapy WFL for tasks assessed/performed             Past Medical History:  Diagnosis Date   Balance problem    Gout    Hypercholesterolemia    Hypertension    Parkinson's disease    Small bowel obstruction ([place])    Past Surgical History:  Procedure Laterality Date   BOWEL BLOCKAGE  05/2019   IR ANGIO INTRA EXTRACRAN SEL COM CAROTID INNOMINATE BILAT MOD SED  10/03/2021   IR ANGIO VERTEBRAL SEL VERTEBRAL UNI R MOD SED  10/03/2021   IR RADIOLOGIST EVAL & MGMT  08/19/2021   IR US GUIDE VASC ACCESS RIGHT  10/04/2021   Patient Active Problem List   Diagnosis Date Noted   Parkinson's disease 02/06/2021   SBO (small bowel obstruction) ([place]) 07/06/2019    ONSET DATE: 02/11/2022 (MD referral)  REFERRING DIAG: G20.A1 (ICD-10-CM) - Parkinson's disease without dyskinesia or fluctuating manifestations   THERAPY DIAG:  Unsteadiness on feet  Other abnormalities of gait and mobility  Other symptoms and signs involving the nervous system  Abnormal posture  Rationale for Evaluation and Treatment: Rehabilitation  SUBJECTIVE:                                                                                                                                                                                             SUBJECTIVE STATEMENT: "Balance is lousy, exercises are  going well."  It's the first step that typically gets me. Pt accompanied by: self  PERTINENT HISTORY: Parkinson's disease, ICA aneurysm (stable April 2022), MCI  PAIN:  Are you having pain? No  PRECAUTIONS: Fall  WEIGHT BEARING RESTRICTIONS: No  FALLS: Has patient fallen in last 6 months? Yes. Number of falls 2 (One fall was at night, one fall tripped over the laundry basket)  LIVING ENVIRONMENT: Lives with: lives with their spouse Lives in: House/apartment  Stairs: Yes: External: 3 steps; on right going up Has following equipment at home: None  PLOF: Independent  Exercise routine:  Walks in the neighborhood daily, but that has been limited due to weather and balance.  Does the [employer] spin class on Thursdays  PATIENT GOALS: To get balance better  OBJECTIVE:    TODAY'S TREATMENT: 03/10/2022 Activity Comments  Sit<>stand 4 x 5 reps 3rd and 4th set standing on Airex; several episodes of posterior lean  Forward/back stepping along counter, focus on counting steps to take longer strides Goes from 7 steps to 4-5 steps in forward direction  Forward step over obstacle 2 x 10 reps Cues for heelstrike  Quarter turns R and L, 8 reps, progressing from side>middle to side>middle>side    Resisted forward walking and sidestepping in parallel bars Blue theraband, cues for posture, increased step length; several initial LOB with pt able to reset with UE support  Standing on Airex:   -heel/toe raises 2 x 10 (EO and EC) -head turns/head nods x 5 EO -head turns/head nods x 5 EC -wall bumps x 10 reps with UE support       Access Code: [email or web address] URL: [email or web address] Date: 03/10/2022 Prepared by: [facility]  Program Notes Make sure to walk with TALL posture and relaxed ARM SWING  Exercises - Heel Toe Raises with Counter Support  - 1 x daily - 7 x weekly - 2 sets - 10 reps - 3 sec hold - Staggered Stance Forward Backward Weight Shift with  Counter Support  - 1 x daily - 7 x weekly - 2 sets - 10 reps - Standing on Foam Pad  - 1 x daily - 7 x weekly - 1 sets - 5-10 reps  -head turns/head nods 5-10 reps  -heel/toe raises - Standing Balance with Eyes Closed on Foam  - 1 x daily - 5 x weekly - 1 sets - 5-10 reps  -head turns/head nods 5-10 reps  -heel/toe raises  PATIENT EDUCATION: Education details: HEP additions; Cues for upright stand with sit>stand, brief pause, and then Start with BIG step length (pt tends to keep forward flexed posture and starts quickly with small steps) Person educated: Patient Education method: Explanation, Demonstration, and Handouts Education comprehension: verbalized understanding, returned demonstration, and verbal cues required  ---------------------------------------------------------------------------------- Objective measures below taken at initial evaluation:  DIAGNOSTIC FINDINGS: per Dr. Carles Collet note:  DaTscan previously fairly equivocal  COGNITION: Overall cognitive status: Within functional limits for tasks assessed   SENSATION: Light touch: WFL  MUSCLE TONE: LLE: Mild  POSTURE: rounded shoulders and forward head  LOWER EXTREMITY ROM:   WFL  Active  Right Eval Left Eval  Hip flexion    Hip extension    Hip abduction    Hip adduction    Hip internal rotation    Hip external rotation    Knee flexion    Knee extension    Ankle dorsiflexion    Ankle plantarflexion    Ankle inversion    Ankle eversion     (Blank rows = not tested)  LOWER EXTREMITY MMT:  Grossly tested at least 4/5 with MMT  MMT Right Eval Left Eval  Hip flexion    Hip extension    Hip abduction    Hip adduction    Hip internal rotation    Hip external rotation    Knee flexion    Knee extension    Ankle dorsiflexion    Ankle plantarflexion  Ankle inversion    Ankle eversion    (Blank rows = not tested)    TRANSFERS: Assistive device utilized: None  Sit to stand: Modified independence Stand  to sit: Modified independence  GAIT: Gait pattern: step through pattern, decreased arm swing- Left, and decreased step length- Left  Holds LUE flexed at elbow and gently fisted with gait during testing for eval; able to relax for arm swing when cued to do so. Distance walked: 50 ft x 4 Assistive device utilized: None Level of assistance: Modified independence Comments: 8.31 sec in 10 M walk:3.95 ft/sec  FUNCTIONAL TESTS:  5 times sit to stand: 13.44 sec without UE support Timed up and go (TUG): 10.56 MiniBESTest: 19/28 (scores <22/28 indicate increased fall risk)  TUG cognitive:  11.59 sec (holds hands together in front of body) TUG manual:  10.63 (holds LUE close to body)  TODAY'S TREATMENT:                                                                                                                              DATE: 02/17/2022    PATIENT EDUCATION: Education details: PT eval results, POC; HE initiated Person educated: Patient Education method: Explanation, Demonstration, and Handouts Education comprehension: verbalized understanding and returned demonstration  HOME EXERCISE PROGRAM: Access Code: [email or web address] URL: [email or web address] Date: 02/17/2022 Prepared by: [facility]  Program Notes Make sure to walk with TALL posture and relaxed ARM SWING  Exercises - Heel Toe Raises with Counter Support  - 1 x daily - 7 x weekly - 2 sets - 10 reps - 3 sec hold - Staggered Stance Forward Backward Weight Shift with Counter Support  - 1 x daily - 7 x weekly - 2 sets - 10 reps  GOALS: Goals reviewed with patient? Yes  SHORT TERM GOALS: Target date: 03/14/2022  Pt will be independent with HEP for improved balance, gait. Baseline: Goal status: IN PROGRESS  2.  Pt will improve 5x sit<>stand to less than or equal to 12.5 sec to demonstrate improved functional strength and transfer efficiency. Baseline: 13.44 sec Goal status: IN  PROGRESS  3.  Pt will verbalize understanding of fall prevention in home environment. Baseline:  Goal status: IN PROGRESS   LONG TERM GOALS: Target date: 04/11/2022  Pt will be independent with HEP for improved balance, gait. Baseline:  Goal status: IN PROGRESS  2.  Pt will improve MiniBESTest score to at least 22/28 to decrease fall risk. Baseline: 19/28 Goal status: IN PROGRESS  3.  Pt will verbalize plans for continued community fitness upon d/c from PT. Baseline:  Goal status: IN PROGRESS   ASSESSMENT:  CLINICAL IMPRESSION: Skilled PT session today focused on balance work to address pt's reports of being off-balance with initiation of gait and turns.  Worked on activities to increase intensity and amplitude of movement patterns and upright posture-used resistance with gait, counting to take large steps, wide base turning  steps.  Pt needs continued reminder cues for posture and foot clearance.  He does have several episodes of LOB and smaller step length with resisted gait, improves with cues.  He will continue to benefit from skilled PT to work towards goals for improved balance and mobility.   OBJECTIVE IMPAIRMENTS: Abnormal gait, decreased balance, decreased coordination, decreased knowledge of use of DME, decreased mobility, difficulty walking, decreased strength, and postural dysfunction.   ACTIVITY LIMITATIONS: bending, standing, squatting, transfers, and locomotion level  PARTICIPATION LIMITATIONS: community activity and yard work  PERSONAL FACTORS: 3+ comorbidities: See PMH above  are also affecting patient's functional outcome.   REHAB POTENTIAL: Good  CLINICAL DECISION MAKING: Evolving/moderate complexity  EVALUATION COMPLEXITY: Moderate  PLAN:  PT FREQUENCY: 2x/week  PT DURATION: 8 weeks including eval week  PLANNED INTERVENTIONS: Therapeutic exercises, Therapeutic activity, Neuromuscular re-education, Balance training, Gait training, Patient/Family  education, Self Care, and DME instructions  PLAN FOR NEXT SESSION: Check STGs next visit; review additions to HEP.  Continue work on compliant surfaces.   Large amplitude movement patterns, gait with arm swing, step length, posture; may consider trial of cane.   Frazier Butt., PT 03/10/2022, 1:15 PM   [facility] at [facility] [place], [place], [place] [address] Phone # [phone] Fax # [phone]

## 2022-03-13 ENCOUNTER — Ambulatory Visit: Payer: Medicare Other | Admitting: Physical Therapy

## 2022-03-13 ENCOUNTER — Encounter: Payer: Self-pay | Admitting: Physical Therapy

## 2022-03-13 DIAGNOSIS — R293 Abnormal posture: Secondary | ICD-10-CM

## 2022-03-13 DIAGNOSIS — R2681 Unsteadiness on feet: Secondary | ICD-10-CM | POA: Diagnosis not present

## 2022-03-13 DIAGNOSIS — R29818 Other symptoms and signs involving the nervous system: Secondary | ICD-10-CM

## 2022-03-13 DIAGNOSIS — R2689 Other abnormalities of gait and mobility: Secondary | ICD-10-CM

## 2022-03-13 NOTE — Patient Instructions (Signed)

## 2022-03-13 NOTE — Therapy (Signed)
OUTPATIENT PHYSICAL THERAPY NEURO TREATMENT NOTE   Patient Name: Cody Dickerson MRN: [id number] DOB:Aug 03, 1950, 72 y.o., male Today's Date: 03/13/2022   PCP: Lawerance Cruel, MD REFERRING PROVIDER: Alonza Bogus, DO  END OF SESSION:  PT End of Session - 03/13/22 1020     Visit Number 6    Number of Visits 16    Date for PT Re-Evaluation 04/11/22    Authorization Type Medicare    PT Start Time 1020    PT Stop Time 1100    PT Time Calculation (min) 40 min    Equipment Utilized During Treatment Gait belt   will need gait belt due to fall risk   Activity Tolerance Patient tolerated treatment well    Behavior During Therapy WFL for tasks assessed/performed             Past Medical History:  Diagnosis Date   Balance problem    Gout    Hypercholesterolemia    Hypertension    Parkinson's disease    Small bowel obstruction ([place])    Past Surgical History:  Procedure Laterality Date   BOWEL BLOCKAGE  05/2019   IR ANGIO INTRA EXTRACRAN SEL COM CAROTID INNOMINATE BILAT MOD SED  10/03/2021   IR ANGIO VERTEBRAL SEL VERTEBRAL UNI R MOD SED  10/03/2021   IR RADIOLOGIST EVAL & MGMT  08/19/2021   IR US GUIDE VASC ACCESS RIGHT  10/04/2021   Patient Active Problem List   Diagnosis Date Noted   Parkinson's disease 02/06/2021   SBO (small bowel obstruction) ([place]) 07/06/2019    ONSET DATE: 02/11/2022 (MD referral)  REFERRING DIAG: G20.A1 (ICD-10-CM) - Parkinson's disease without dyskinesia or fluctuating manifestations   THERAPY DIAG:  Unsteadiness on feet  Other abnormalities of gait and mobility  Other symptoms and signs involving the nervous system  Abnormal posture  Rationale for Evaluation and Treatment: Rehabilitation  SUBJECTIVE:                                                                                                                                                                                             SUBJECTIVE STATEMENT: Used the pillow at home for the  exercises and it went okay. Pt accompanied by: self  PERTINENT HISTORY: Parkinson's disease, ICA aneurysm (stable April 2022), MCI  PAIN:  Are you having pain? No  PRECAUTIONS: Fall  WEIGHT BEARING RESTRICTIONS: No  FALLS: Has patient fallen in last 6 months? Yes. Number of falls 2 (One fall was at night, one fall tripped over the laundry basket)  LIVING ENVIRONMENT: Lives with: lives with their spouse Lives in: House/apartment Stairs: Yes: External: 3  steps; on right going up Has following equipment at home: None  PLOF: Independent  Exercise routine:  Walks in the neighborhood daily, but that has been limited due to weather and balance.  Does the [employer] spin class on Thursdays  PATIENT GOALS: To get balance better  OBJECTIVE:    TODAY'S TREATMENT: 03/13/2022 Activity Comments  5x sit <>Stand, 3 trials: 14.82 sec, 15.72 sec; 13.94 sec  13.94 sec at best, with pt having some posterior lean  Sit<>Stand practice with cues for optimal    Reviewed HEP additions from last visit Pt return demo understanding  On Airex:   -wall bumps x 10 reps with UE support -step strategies forward, back, side x 10 reps   Resisted forward walking in parallel bars Visual, tactile cues for posture  Gait training with SPC 20 ft x 4 reps, min guard Hand over hand>verbal cues for sequence  Gait training with SPC and small rubber tip base 20 ft x 8 reps Hand over hand>verbal cues for sequence, improves slightly with repetition     *Pt with tremulous RUE movement pattern with use of cane, worse with SPC than the small rubber quad tip.  PATIENT EDUCATION: Education details: Fall prevention education, progress towards goals; initial gait training with cane Person educated: Patient Education method: Explanation and Handouts Education comprehension: verbalized understanding    Access Code: [email or web address] URL: [email or web address] Date: 03/10/2022 Prepared by: [facility]  Program Notes Make sure to walk with TALL posture and relaxed ARM SWING  Exercises - Heel Toe Raises with Counter Support  - 1 x daily - 7 x weekly - 2 sets - 10 reps - 3 sec hold - Staggered Stance Forward Backward Weight Shift with Counter Support  - 1 x daily - 7 x weekly - 2 sets - 10 reps - Standing on Foam Pad  - 1 x daily - 7 x weekly - 1 sets - 5-10 reps  -head turns/head nods 5-10 reps  -heel/toe raises - Standing Balance with Eyes Closed on Foam  - 1 x daily - 5 x weekly - 1 sets - 5-10 reps  -head turns/head nods 5-10 reps  -heel/toe raises   ---------------------------------------------------------------------------------- Objective measures below taken at initial evaluation:  DIAGNOSTIC FINDINGS: per Dr. Carles Collet note:  DaTscan previously fairly equivocal  COGNITION: Overall cognitive status: Within functional limits for tasks assessed   SENSATION: Light touch: WFL  MUSCLE TONE: LLE: Mild  POSTURE: rounded shoulders and forward head  LOWER EXTREMITY ROM:   WFL  Active  Right Eval Left Eval  Hip flexion    Hip extension    Hip abduction    Hip adduction    Hip internal rotation    Hip external rotation    Knee flexion    Knee extension    Ankle dorsiflexion    Ankle plantarflexion    Ankle inversion    Ankle eversion     (Blank rows = not tested)  LOWER EXTREMITY MMT:  Grossly tested at least 4/5 with MMT   TRANSFERS: Assistive device utilized: None  Sit to stand: Modified independence Stand to sit: Modified independence  GAIT: Gait pattern: step through pattern, decreased arm swing- Left, and decreased step length- Left  Holds LUE flexed at elbow and gently fisted with gait during testing for eval; able to relax for arm swing when cued to do so. Distance walked: 50 ft x 4 Assistive device utilized: None Level of assistance: Modified independence  Comments: 8.31 sec in 10 M walk:3.95 ft/sec  FUNCTIONAL TESTS:  5 times sit to stand:  13.44 sec without UE support Timed up and go (TUG): 10.56 MiniBESTest: 19/28 (scores <22/28 indicate increased fall risk)  TUG cognitive:  11.59 sec (holds hands together in front of body) TUG manual:  10.63 (holds LUE close to body)  TODAY'S TREATMENT:                                                                                                                              DATE: 02/17/2022    PATIENT EDUCATION: Education details: PT eval results, POC; HE initiated Person educated: Patient Education method: Explanation, Demonstration, and Handouts Education comprehension: verbalized understanding and returned demonstration  HOME EXERCISE PROGRAM: Access Code: [email or web address] URL: [email or web address] Date: 02/17/2022 Prepared by: [facility] [place] [facility]  Program Notes Make sure to walk with TALL posture and relaxed ARM SWING  Exercises - Heel Toe Raises with Counter Support  - 1 x daily - 7 x weekly - 2 sets - 10 reps - 3 sec hold - Staggered Stance Forward Backward Weight Shift with Counter Support  - 1 x daily - 7 x weekly - 2 sets - 10 reps  GOALS: Goals reviewed with patient? Yes  SHORT TERM GOALS: Target date: 03/14/2022  Pt will be independent with HEP for improved balance, gait. Baseline: Goal status: GOAL MET, 03/13/2022  2.  Pt will improve 5x sit<>stand to less than or equal to 12.5 sec to demonstrate improved functional strength and transfer efficiency. Baseline: 13.44 sec>13.94 sec at best with some posterior lean, 03/13/2022 Goal status: GOAL NOT MET, 03/13/2022  3.  Pt will verbalize understanding of fall prevention in home environment. Baseline:  Goal status: GOAL MET   LONG TERM GOALS: Target date: 04/11/2022  Pt will be independent with HEP for improved balance, gait. Baseline:  Goal status: IN PROGRESS  2.  Pt will improve MiniBESTest score to at least 22/28 to decrease fall risk. Baseline: 19/28 Goal status: IN  PROGRESS  3.  Pt will verbalize plans for continued community fitness upon d/c from PT. Baseline:  Goal status: IN PROGRESS   ASSESSMENT:  CLINICAL IMPRESSION: Assessed STGs this visit, with pt meeting 2 of 3 STGs.  Pt has met STG 1 for HEP and STG 3 for fall prevention.  STG 2 not met for FTSTS, with pt experiencing some posterior tendencies with sit<>stand.  Focused also today on hip/ankle/step strategies for balance with compliant surface worked.  Introduced cane for gait training today, and pt has difficulty with initial sequence and RUE holding cane is quite tremulous.  He improves slightly with repetition and he will benefit from additional skilled PT towards LTGs to continue to address balance and gait training with cane.    OBJECTIVE IMPAIRMENTS: Abnormal gait, decreased balance, decreased coordination, decreased knowledge of use of DME, decreased mobility, difficulty walking, decreased strength, and postural dysfunction.  ACTIVITY LIMITATIONS: bending, standing, squatting, transfers, and locomotion level  PARTICIPATION LIMITATIONS: community activity and yard work  PERSONAL FACTORS: 3+ comorbidities: See PMH above  are also affecting patient's functional outcome.   REHAB POTENTIAL: Good  CLINICAL DECISION MAKING: Evolving/moderate complexity  EVALUATION COMPLEXITY: Moderate  PLAN:  PT FREQUENCY: 2x/week  PT DURATION: 8 weeks including eval week  PLANNED INTERVENTIONS: Therapeutic exercises, Therapeutic activity, Neuromuscular re-education, Balance training, Gait training, Patient/Family education, Self Care, and DME instructions  PLAN FOR NEXT SESSION: Continue gait trial with cane with small quad tip base.  Continue work on compliant surfaces.   Large amplitude movement patterns, gait with arm swing, step length, posture  Cyree Chuong W., PT 03/13/2022, 12:48 PM   [facility] at [facility], [place], [place]  [address] Phone # [phone] Fax # [phone]

## 2022-03-17 ENCOUNTER — Ambulatory Visit: Payer: Medicare Other | Admitting: Physical Therapy

## 2022-03-17 ENCOUNTER — Encounter: Payer: Self-pay | Admitting: Physical Therapy

## 2022-03-17 DIAGNOSIS — R29818 Other symptoms and signs involving the nervous system: Secondary | ICD-10-CM | POA: Diagnosis not present

## 2022-03-17 DIAGNOSIS — R2681 Unsteadiness on feet: Secondary | ICD-10-CM

## 2022-03-17 DIAGNOSIS — R293 Abnormal posture: Secondary | ICD-10-CM | POA: Diagnosis not present

## 2022-03-17 DIAGNOSIS — R2689 Other abnormalities of gait and mobility: Secondary | ICD-10-CM | POA: Diagnosis not present

## 2022-03-17 NOTE — Therapy (Signed)
OUTPATIENT PHYSICAL THERAPY NEURO TREATMENT NOTE   Patient Name: MAKHAI FULCO MRN: [id number] DOB:03/22/1950, 72 y.o., male Today's Date: 03/17/2022   PCP: Lawerance Cruel, MD REFERRING PROVIDER: Alonza Bogus, DO  END OF SESSION:  PT End of Session - 03/17/22 1020     Visit Number 7    Number of Visits 16    Date for PT Re-Evaluation 04/11/22    Authorization Type Medicare    PT Start Time 1020    PT Stop Time 1100    PT Time Calculation (min) 40 min    Equipment Utilized During Treatment Gait belt   will need gait belt due to fall risk   Activity Tolerance Patient tolerated treatment well    Behavior During Therapy WFL for tasks assessed/performed             Past Medical History:  Diagnosis Date   Balance problem    Gout    Hypercholesterolemia    Hypertension    Parkinson's disease    Small bowel obstruction ([place])    Past Surgical History:  Procedure Laterality Date   BOWEL BLOCKAGE  05/2019   IR ANGIO INTRA EXTRACRAN SEL COM CAROTID INNOMINATE BILAT MOD SED  10/03/2021   IR ANGIO VERTEBRAL SEL VERTEBRAL UNI R MOD SED  10/03/2021   IR RADIOLOGIST EVAL & MGMT  08/19/2021   IR US GUIDE VASC ACCESS RIGHT  10/04/2021   Patient Active Problem List   Diagnosis Date Noted   Parkinson's disease 02/06/2021   SBO (small bowel obstruction) ([place]) 07/06/2019    ONSET DATE: 02/11/2022 (MD referral)  REFERRING DIAG: G20.A1 (ICD-10-CM) - Parkinson's disease without dyskinesia or fluctuating manifestations   THERAPY DIAG:  Unsteadiness on feet  Other abnormalities of gait and mobility  Other symptoms and signs involving the nervous system  Rationale for Evaluation and Treatment: Rehabilitation  SUBJECTIVE:                                                                                                                                                                                             SUBJECTIVE STATEMENT: No changes today.  No falls or stumbles. Pt  accompanied by: self  PERTINENT HISTORY: Parkinson's disease, ICA aneurysm (stable April 2022), MCI  PAIN:  Are you having pain? No  PRECAUTIONS: Fall  WEIGHT BEARING RESTRICTIONS: No  FALLS: Has patient fallen in last 6 months? Yes. Number of falls 2 (One fall was at night, one fall tripped over the laundry basket)  LIVING ENVIRONMENT: Lives with: lives with their spouse Lives in: House/apartment Stairs: Yes: External: 3 steps; on right going up Has following  equipment at home: None  PLOF: Independent  Exercise routine:  Walks in the neighborhood daily, but that has been limited due to weather and balance.  Does the [employer] spin class on Thursdays  PATIENT GOALS: To get balance better  OBJECTIVE:    TODAY'S TREATMENT: 03/17/2022 Activity Comments  Sit<>stand 5 reps x 2 reps Cues for initial increased forward lean, "nose over toes", and good performance with this with no retropulsion  Sit<>stand with feet on airex, 2 x 5 reps Initial cues for forward lean and upright posture to stand  SLS:  Alt step taps to 6" step x 10, 12" step x 10, then forward step ups x 10; on solid ground, then 2nd set on Airex   On Airex:  heel/toes raises 2 x 10 reps, on solid surface, single leg heel raises x 10   Gait training with SPC with small rubber quad tip, 40 ft x 6 reps, with min guard Difficulty with sequence after about 10-15 ft, decreased stride length.    Gait with focus on long stride length, posture, and arm swing 40 ft x 4 reps, then resisted gait with blue theraband 40 ft x 8 reps Good attention to posture  Forward/back step and weightshift with added arm swing and head motions, 15 reps 1 UE support, cues for increased step length throughout  Posture exercises at doorframe:  scapular retraction 10 reps, then neck retraction x 5 reps More difficulty with neck retraction technique     TREATMENT: 03/13/2022 Activity Comments  5x sit <>Stand, 3 trials: 14.82 sec, 15.72 sec; 13.94 sec  13.94  sec at best, with pt having some posterior lean  Sit<>Stand practice with cues for optimal    Reviewed HEP additions from last visit Pt return demo understanding  On Airex:   -wall bumps x 10 reps with UE support -step strategies forward, back, side x 10 reps   Resisted forward walking in parallel bars Visual, tactile cues for posture  Gait training with SPC 20 ft x 4 reps, min guard Hand over hand>verbal cues for sequence  Gait training with SPC and small rubber tip base 20 ft x 8 reps Hand over hand>verbal cues for sequence, improves slightly with repetition        Access Code: [email or web address] URL: [email or web address] Date: 03/10/2022 Prepared by: [facility]  Program Notes Make sure to walk with TALL posture and relaxed ARM SWING  Exercises - Heel Toe Raises with Counter Support  - 1 x daily - 7 x weekly - 2 sets - 10 reps - 3 sec hold - Staggered Stance Forward Backward Weight Shift with Counter Support  - 1 x daily - 7 x weekly - 2 sets - 10 reps - Standing on Foam Pad  - 1 x daily - 7 x weekly - 1 sets - 5-10 reps  -head turns/head nods 5-10 reps  -heel/toe raises - Standing Balance with Eyes Closed on Foam  - 1 x daily - 5 x weekly - 1 sets - 5-10 reps  -head turns/head nods 5-10 reps  -heel/toe raises   ---------------------------------------------------------------------------------- Objective measures below taken at initial evaluation:  DIAGNOSTIC FINDINGS: per Dr. Carles Collet note:  DaTscan previously fairly equivocal  COGNITION: Overall cognitive status: Within functional limits for tasks assessed   SENSATION: Light touch: WFL  MUSCLE TONE: LLE: Mild  POSTURE: rounded shoulders and forward head  LOWER EXTREMITY ROM:   WFL  Active  Right Eval Left Eval  Hip flexion    Hip extension    Hip abduction    Hip adduction    Hip internal rotation    Hip external rotation    Knee flexion    Knee extension    Ankle  dorsiflexion    Ankle plantarflexion    Ankle inversion    Ankle eversion     (Blank rows = not tested)  LOWER EXTREMITY MMT:  Grossly tested at least 4/5 with MMT   TRANSFERS: Assistive device utilized: None  Sit to stand: Modified independence Stand to sit: Modified independence  GAIT: Gait pattern: step through pattern, decreased arm swing- Left, and decreased step length- Left  Holds LUE flexed at elbow and gently fisted with gait during testing for eval; able to relax for arm swing when cued to do so. Distance walked: 50 ft x 4 Assistive device utilized: None Level of assistance: Modified independence Comments: 8.31 sec in 10 M walk:3.95 ft/sec  FUNCTIONAL TESTS:  5 times sit to stand: 13.44 sec without UE support Timed up and go (TUG): 10.56 MiniBESTest: 19/28 (scores <22/28 indicate increased fall risk)  TUG cognitive:  11.59 sec (holds hands together in front of body) TUG manual:  10.63 (holds LUE close to body)  TODAY'S TREATMENT:                                                                                                                              DATE: 02/17/2022    PATIENT EDUCATION: Education details: PT eval results, POC; HE initiated Person educated: Patient Education method: Explanation, Demonstration, and Handouts Education comprehension: verbalized understanding and returned demonstration  HOME EXERCISE PROGRAM: Access Code: [email or web address] URL: [email or web address] Date: 02/17/2022 Prepared by: [facility]  Program Notes Make sure to walk with TALL posture and relaxed ARM SWING  Exercises - Heel Toe Raises with Counter Support  - 1 x daily - 7 x weekly - 2 sets - 10 reps - 3 sec hold - Staggered Stance Forward Backward Weight Shift with Counter Support  - 1 x daily - 7 x weekly - 2 sets - 10 reps  GOALS: Goals reviewed with patient? Yes  SHORT TERM GOALS: Target date: 03/14/2022  Pt will be  independent with HEP for improved balance, gait. Baseline: Goal status: GOAL MET, 03/13/2022  2.  Pt will improve 5x sit<>stand to less than or equal to 12.5 sec to demonstrate improved functional strength and transfer efficiency. Baseline: 13.44 sec>13.94 sec at best with some posterior lean, 03/13/2022 Goal status: GOAL NOT MET, 03/13/2022  3.  Pt will verbalize understanding of fall prevention in home environment. Baseline:  Goal status: GOAL MET   LONG TERM GOALS: Target date: 04/11/2022  Pt will be independent with HEP for improved balance, gait. Baseline:  Goal status: IN PROGRESS  2.  Pt will improve MiniBESTest score to at least 22/28 to decrease fall risk. Baseline: 19/28 Goal status: IN  PROGRESS  3.  Pt will verbalize plans for continued community fitness upon d/c from PT. Baseline:  Goal status: IN PROGRESS   ASSESSMENT:  CLINICAL IMPRESSION: Skilled PT session today focused on balance exercises on solid and compliant surfaces; with compliant surfaces, pt does have more tendency to need UE support and have increased forward head posture to look down at feet.  Also focused on trial of gait training with cane again today; pt with continued difficulty with cane sequence, to the point it is making his gait pattern more off that without use of cane.  Plan to continue gait training in sessions without cane, with more focus on posture and stride length.  Overall, with gait activities today, pt has good attention to posture today.  He will continue to benefit from skilled PT towards LTGs for improved balance and decreased fall risk.  OBJECTIVE IMPAIRMENTS: Abnormal gait, decreased balance, decreased coordination, decreased knowledge of use of DME, decreased mobility, difficulty walking, decreased strength, and postural dysfunction.   ACTIVITY LIMITATIONS: bending, standing, squatting, transfers, and locomotion level  PARTICIPATION LIMITATIONS: community activity and yard  work  PERSONAL FACTORS: 3+ comorbidities: See PMH above  are also affecting patient's functional outcome.   REHAB POTENTIAL: Good  CLINICAL DECISION MAKING: Evolving/moderate complexity  EVALUATION COMPLEXITY: Moderate  PLAN:  PT FREQUENCY: 2x/week  PT DURATION: 8 weeks including eval week  PLANNED INTERVENTIONS: Therapeutic exercises, Therapeutic activity, Neuromuscular re-education, Balance training, Gait training, Patient/Family education, Self Care, and DME instructions  PLAN FOR NEXT SESSION: SLS, hip stability, resisted activities.  Continue work on compliant surfaces.   Large amplitude movement patterns, gait with arm swing, step length, posture  Nekisha Mcdiarmid W., PT 03/17/2022, 1:37 PM    Outpatient Rehab at [facility] [address] [place], [place] [address] Phone # [phone] Fax # [phone]

## 2022-03-20 ENCOUNTER — Ambulatory Visit: Payer: Medicare Other | Admitting: Physical Therapy

## 2022-03-20 ENCOUNTER — Encounter: Payer: Self-pay | Admitting: Physical Therapy

## 2022-03-20 DIAGNOSIS — R2689 Other abnormalities of gait and mobility: Secondary | ICD-10-CM | POA: Diagnosis not present

## 2022-03-20 DIAGNOSIS — R2681 Unsteadiness on feet: Secondary | ICD-10-CM

## 2022-03-20 DIAGNOSIS — R29818 Other symptoms and signs involving the nervous system: Secondary | ICD-10-CM

## 2022-03-20 DIAGNOSIS — R293 Abnormal posture: Secondary | ICD-10-CM | POA: Diagnosis not present

## 2022-03-20 NOTE — Therapy (Signed)
OUTPATIENT PHYSICAL THERAPY NEURO TREATMENT NOTE   Patient Name: Cody Dickerson MRN: [id number] DOB:Nov 25, 1950, 72 y.o., male Today's Date: 03/20/2022   PCP: Lawerance Cruel, MD REFERRING PROVIDER: Alonza Bogus, DO  END OF SESSION:  PT End of Session - 03/20/22 1022     Visit Number 8    Number of Visits 16    Date for PT Re-Evaluation 04/11/22    Authorization Type Medicare    PT Start Time 1020    PT Stop Time 1100    PT Time Calculation (min) 40 min    Equipment Utilized During Treatment Gait belt   will need gait belt due to fall risk   Activity Tolerance Patient tolerated treatment well    Behavior During Therapy WFL for tasks assessed/performed             Past Medical History:  Diagnosis Date   Balance problem    Gout    Hypercholesterolemia    Hypertension    Parkinson's disease    Small bowel obstruction ([place])    Past Surgical History:  Procedure Laterality Date   BOWEL BLOCKAGE  05/2019   IR ANGIO INTRA EXTRACRAN SEL COM CAROTID INNOMINATE BILAT MOD SED  10/03/2021   IR ANGIO VERTEBRAL SEL VERTEBRAL UNI R MOD SED  10/03/2021   IR RADIOLOGIST EVAL & MGMT  08/19/2021   IR US GUIDE VASC ACCESS RIGHT  10/04/2021   Patient Active Problem List   Diagnosis Date Noted   Parkinson's disease 02/06/2021   SBO (small bowel obstruction) ([place]) 07/06/2019    ONSET DATE: 02/11/2022 (MD referral)  REFERRING DIAG: G20.A1 (ICD-10-CM) - Parkinson's disease without dyskinesia or fluctuating manifestations   THERAPY DIAG:  Unsteadiness on feet  Other abnormalities of gait and mobility  Other symptoms and signs involving the nervous system  Rationale for Evaluation and Treatment: Rehabilitation  SUBJECTIVE:                                                                                                                                                                                             SUBJECTIVE STATEMENT: Exercises are going well; still having a hard time  when I first get going.   Pt accompanied by: self  PERTINENT HISTORY: Parkinson's disease, ICA aneurysm (stable April 2022), MCI  PAIN:  Are you having pain? No  PRECAUTIONS: Fall  WEIGHT BEARING RESTRICTIONS: No  FALLS: Has patient fallen in last 6 months? Yes. Number of falls 2 (One fall was at night, one fall tripped over the laundry basket)  LIVING ENVIRONMENT: Lives with: lives with their spouse Lives in: House/apartment Stairs: Yes: External:  3 steps; on right going up Has following equipment at home: None  PLOF: Independent  Exercise routine:  Walks in the neighborhood daily, but that has been limited due to weather and balance.  Does the [employer] spin class on Thursdays  PATIENT GOALS: To get balance better  OBJECTIVE:   TODAY'S TREATMENT: 03/20/2022 Activity Comments  Sit<>stand 5 reps x 2 reps Cues for technique; several episodes of posterior lean  Sit<>stand with feet on airex, 2 x 5 reps Cues for forward lean, making sure to scoot forward, as pt has several episodes of posterior lean  Minisquats 2 x 10, then squats to up on toes x 10   SLS:  Alt step taps to 6" step x 10, 12" step x 10, then forward step ups to 6" x 10; standing   on Airex Light UE support, cues for upright posture  Gait activities, 2 min each:  -Forward/back walking -Forward walk with turns, with cues for reciprocal arm swing and step length, upright posture -Step and stop activity with focus on balance, posture, step length, then additional 2 minutes with added reciprocal arm swing Difficulty coordinating step and stop activity with reciprocal arm swing; takes a shorter step on RUE  Cues throughout gait activities for increased intensity of stride length and arm swing to help with balance     PATIENT EDUCATION: Education details: Intensity of gait-stride length, posture, arm swing; cues to stand up with best posture prior to starting gait Person educated: Patient Education method: Explanation,  Demonstration, and Verbal cues Education comprehension: verbalized understanding, returned demonstration, and needs further education    Access Code: [email or web address] URL: [email or web address] Date: 03/10/2022 Prepared by: [facility]  Program Notes Make sure to walk with TALL posture and relaxed ARM SWING  Exercises - Heel Toe Raises with Counter Support  - 1 x daily - 7 x weekly - 2 sets - 10 reps - 3 sec hold - Staggered Stance Forward Backward Weight Shift with Counter Support  - 1 x daily - 7 x weekly - 2 sets - 10 reps - Standing on Foam Pad  - 1 x daily - 7 x weekly - 1 sets - 5-10 reps  -head turns/head nods 5-10 reps  -heel/toe raises - Standing Balance with Eyes Closed on Foam  - 1 x daily - 5 x weekly - 1 sets - 5-10 reps  -head turns/head nods 5-10 reps  -heel/toe raises   ---------------------------------------------------------------------------------- Objective measures below taken at initial evaluation:  DIAGNOSTIC FINDINGS: per Dr. Carles Collet note:  DaTscan previously fairly equivocal  COGNITION: Overall cognitive status: Within functional limits for tasks assessed   SENSATION: Light touch: WFL  MUSCLE TONE: LLE: Mild  POSTURE: rounded shoulders and forward head  LOWER EXTREMITY ROM:   WFL for BLES   LOWER EXTREMITY MMT:  Grossly tested at least 4/5 with MMT   TRANSFERS: Assistive device utilized: None  Sit to stand: Modified independence Stand to sit: Modified independence  GAIT: Gait pattern: step through pattern, decreased arm swing- Left, and decreased step length- Left  Holds LUE flexed at elbow and gently fisted with gait during testing for eval; able to relax for arm swing when cued to do so. Distance walked: 50 ft x 4 Assistive device utilized: None Level of assistance: Modified independence Comments: 8.31 sec in 10 M walk:3.95 ft/sec  FUNCTIONAL TESTS:  5 times sit to stand: 13.44 sec without UE  support Timed up and go (TUG): 10.56  MiniBESTest: 19/28 (scores <22/28 indicate increased fall risk)  TUG cognitive:  11.59 sec (holds hands together in front of body) TUG manual:  10.63 (holds LUE close to body)   GOALS: Goals reviewed with patient? Yes  SHORT TERM GOALS: Target date: 03/14/2022  Pt will be independent with HEP for improved balance, gait. Baseline: Goal status: GOAL MET, 03/13/2022  2.  Pt will improve 5x sit<>stand to less than or equal to 12.5 sec to demonstrate improved functional strength and transfer efficiency. Baseline: 13.44 sec>13.94 sec at best with some posterior lean, 03/13/2022 Goal status: GOAL NOT MET, 03/13/2022  3.  Pt will verbalize understanding of fall prevention in home environment. Baseline:  Goal status: GOAL MET   LONG TERM GOALS: Target date: 04/11/2022  Pt will be independent with HEP for improved balance, gait. Baseline:  Goal status: IN PROGRESS  2.  Pt will improve MiniBESTest score to at least 22/28 to decrease fall risk. Baseline: 19/28 Goal status: IN PROGRESS  3.  Pt will verbalize plans for continued community fitness upon d/c from PT. Baseline:  Goal status: IN PROGRESS   ASSESSMENT:  CLINICAL IMPRESSION: Skilled PT session continued to focus on balance, functional strengthening through hip and ankles, with cues to lessen posterior tendency with sit to stand.  Pt reports he is doing exercises well, but still notes difficulty with balance with initiating gait.  Focused on cues for upright posture upon standing and brief pause prior to starting gait; also focused on slowed pace and work on intensity of step length and arm swing,which was difficult for patient.  He will continue to benefit from skilled PT towards LTGs for improved balance and decreased fall risk.  OBJECTIVE IMPAIRMENTS: Abnormal gait, decreased balance, decreased coordination, decreased knowledge of use of DME, decreased mobility, difficulty walking, decreased  strength, and postural dysfunction.   ACTIVITY LIMITATIONS: bending, standing, squatting, transfers, and locomotion level  PARTICIPATION LIMITATIONS: community activity and yard work  PERSONAL FACTORS: 3+ comorbidities: See PMH above  are also affecting patient's functional outcome.   REHAB POTENTIAL: Good  CLINICAL DECISION MAKING: Evolving/moderate complexity  EVALUATION COMPLEXITY: Moderate  PLAN:  PT FREQUENCY: 2x/week  PT DURATION: 8 weeks including eval week  PLANNED INTERVENTIONS: Therapeutic exercises, Therapeutic activity, Neuromuscular re-education, Balance training, Gait training, Patient/Family education, Self Care, and DME instructions  PLAN FOR NEXT SESSION: Start/stop with gait; SLS, hip stability, resisted activities.  Continue work on compliant surfaces.   Large amplitude movement patterns, gait with arm swing, step length, posture  Yaslyn Cumby W., PT 03/21/2022, 10:04 AM   [facility] Out[facility] Rehab at [facility], [place], [place] [address] Phone # [phone] Fax # [phone]

## 2022-03-24 ENCOUNTER — Ambulatory Visit: Payer: Medicare Other | Admitting: Physical Therapy

## 2022-03-24 ENCOUNTER — Encounter: Payer: Self-pay | Admitting: Physical Therapy

## 2022-03-24 DIAGNOSIS — R2689 Other abnormalities of gait and mobility: Secondary | ICD-10-CM

## 2022-03-24 DIAGNOSIS — R293 Abnormal posture: Secondary | ICD-10-CM | POA: Diagnosis not present

## 2022-03-24 DIAGNOSIS — R29818 Other symptoms and signs involving the nervous system: Secondary | ICD-10-CM | POA: Diagnosis not present

## 2022-03-24 DIAGNOSIS — R2681 Unsteadiness on feet: Secondary | ICD-10-CM

## 2022-03-24 NOTE — Therapy (Signed)
OUTPATIENT PHYSICAL THERAPY NEURO TREATMENT NOTE   Patient Name: Cody Dickerson MRN: [id number] DOB:Aug 22, 1950, 72 y.o., male Today's Date: 03/20/2022   PCP: Lawerance Cruel, MD REFERRING PROVIDER: Alonza Bogus, DO  END OF SESSION:  PT End of Session - 03/24/22 1317     Visit Number 9    Number of Visits 16    Date for PT Re-Evaluation 04/11/22    Authorization Type Medicare    PT Start Time 1318    PT Stop Time 1400    PT Time Calculation (min) 42 min    Equipment Utilized During Treatment Gait belt   will need gait belt due to fall risk   Activity Tolerance Patient tolerated treatment well    Behavior During Therapy WFL for tasks assessed/performed             Past Medical History:  Diagnosis Date   Balance problem    Gout    Hypercholesterolemia    Hypertension    Parkinson's disease    Small bowel obstruction ([place])    Past Surgical History:  Procedure Laterality Date   BOWEL BLOCKAGE  05/2019   IR ANGIO INTRA EXTRACRAN SEL COM CAROTID INNOMINATE BILAT MOD SED  10/03/2021   IR ANGIO VERTEBRAL SEL VERTEBRAL UNI R MOD SED  10/03/2021   IR RADIOLOGIST EVAL & MGMT  08/19/2021   IR US GUIDE VASC ACCESS RIGHT  10/04/2021   Patient Active Problem List   Diagnosis Date Noted   Parkinson's disease 02/06/2021   SBO (small bowel obstruction) ([place]) 07/06/2019    ONSET DATE: 02/11/2022 (MD referral)  REFERRING DIAG: G20.A1 (ICD-10-CM) - Parkinson's disease without dyskinesia or fluctuating manifestations   THERAPY DIAG:  Unsteadiness on feet  Other abnormalities of gait and mobility  Other symptoms and signs involving the nervous system  Abnormal posture  Rationale for Evaluation and Treatment: Rehabilitation  SUBJECTIVE:                                                                                                                                                                                             SUBJECTIVE STATEMENT: Nothing new.   Pt accompanied  by: self  PERTINENT HISTORY: Parkinson's disease, ICA aneurysm (stable April 2022), MCI  PAIN:  Are you having pain? No  PRECAUTIONS: Fall  WEIGHT BEARING RESTRICTIONS: No  FALLS: Has patient fallen in last 6 months? Yes. Number of falls 2 (One fall was at night, one fall tripped over the laundry basket)  LIVING ENVIRONMENT: Lives with: lives with their spouse Lives in: House/apartment Stairs: Yes: External: 3 steps; on right going up Has following equipment  at home: None  PLOF: Independent  Exercise routine:  Walks in the neighborhood daily, but that has been limited due to weather and balance.  Does the [employer] spin class on Thursdays  PATIENT GOALS: To get balance better  OBJECTIVE:    TODAY'S TREATMENT: 03/24/2022 Activity Comments  Sit<>stand 5 reps x 2 reps 1 episode of posterior lean  TUG shuttle:  sit<>stand and walk to 10 ft to sit in chair, 10 reps Good balance upon initial stand and initiating gait  Gait activities to work on arm swing: -use of walking poles to facilitate arm swing -walking poles removed with cues for increased stride length and continued arm swing   -cues for increased stride length  -does a good jog with relaxed arm swing with longer strides   Gait with quick stop/starts, with added cognitive activity Cues for wider BOS upon stopping; does better with repetition  Obstacle negotiation-stepping over and around obstacles No LOB, cues for increased step length between obstacles  squats to up on toes 2 x 10, then additional 10 reps, standing on Aire[employer] UE support  Standing on incline, forward/back step and weightshift x 10, then EO, EC, 30 second hold x 2 Improved stability with EC with cues for increased anterior weightshift       Access Code: [email or web address] URL: [email or web address] Date: 03/10/2022 Prepared by: [facility]  Program Notes Make sure to walk with TALL posture and relaxed ARM  SWING  Exercises - Heel Toe Raises with Counter Support  - 1 x daily - 7 x weekly - 2 sets - 10 reps - 3 sec hold - Staggered Stance Forward Backward Weight Shift with Counter Support  - 1 x daily - 7 x weekly - 2 sets - 10 reps - Standing on Foam Pad  - 1 x daily - 7 x weekly - 1 sets - 5-10 reps  -head turns/head nods 5-10 reps  -heel/toe raises - Standing Balance with Eyes Closed on Foam  - 1 x daily - 5 x weekly - 1 sets - 5-10 reps  -head turns/head nods 5-10 reps  -heel/toe raises   ---------------------------------------------------------------------------------- Objective measures below taken at initial evaluation:  DIAGNOSTIC FINDINGS: per Dr. Carles Collet note:  DaTscan previously fairly equivocal  COGNITION: Overall cognitive status: Within functional limits for tasks assessed   SENSATION: Light touch: WFL  MUSCLE TONE: LLE: Mild  POSTURE: rounded shoulders and forward head  LOWER EXTREMITY ROM:   WFL for BLES   LOWER EXTREMITY MMT:  Grossly tested at least 4/5 with MMT   TRANSFERS: Assistive device utilized: None  Sit to stand: Modified independence Stand to sit: Modified independence  GAIT: Gait pattern: step through pattern, decreased arm swing- Left, and decreased step length- Left  Holds LUE flexed at elbow and gently fisted with gait during testing for eval; able to relax for arm swing when cued to do so. Distance walked: 50 ft x 4 Assistive device utilized: None Level of assistance: Modified independence Comments: 8.31 sec in 10 M walk:3.95 ft/sec  FUNCTIONAL TESTS:  5 times sit to stand: 13.44 sec without UE support Timed up and go (TUG): 10.56 MiniBESTest: 19/28 (scores <22/28 indicate increased fall risk)  TUG cognitive:  11.59 sec (holds hands together in front of body) TUG manual:  10.63 (holds LUE close to body)   GOALS: Goals reviewed with patient? Yes  SHORT TERM GOALS: Target date: 03/14/2022  Pt will be independent with HEP for improved  balance, gait. Baseline: Goal status: GOAL MET, 03/13/2022  2.  Pt will improve 5x sit<>stand to less than or equal to 12.5 sec to demonstrate improved functional strength and transfer efficiency. Baseline: 13.44 sec>13.94 sec at best with some posterior lean, 03/13/2022 Goal status: GOAL NOT MET, 03/13/2022  3.  Pt will verbalize understanding of fall prevention in home environment. Baseline:  Goal status: GOAL MET   LONG TERM GOALS: Target date: 04/11/2022  Pt will be independent with HEP for improved balance, gait. Baseline:  Goal status: IN PROGRESS  2.  Pt will improve MiniBESTest score to at least 22/28 to decrease fall risk. Baseline: 19/28 Goal status: IN PROGRESS  3.  Pt will verbalize plans for continued community fitness upon d/c from PT. Baseline:  Goal status: IN PROGRESS   ASSESSMENT:  CLINICAL IMPRESSION: Skilled PT session today continued to focus on dynamic balance and gait.  Pt able to utilize cues for brief pause upon standing prior to initiating gait with TUG shuttle activity.  He responds well to cues and repetition with wider BOS with quick stops/starts with gait.  He continues to need cues to focus on longer strides in order to allow for relaxed arm swing and better balance with gait.  He will continue to benefit from skilled PT towards LTGs for improved balance and decreased fall risk.  OBJECTIVE IMPAIRMENTS: Abnormal gait, decreased balance, decreased coordination, decreased knowledge of use of DME, decreased mobility, difficulty walking, decreased strength, and postural dysfunction.   ACTIVITY LIMITATIONS: bending, standing, squatting, transfers, and locomotion level  PARTICIPATION LIMITATIONS: community activity and yard work  PERSONAL FACTORS: 3+ comorbidities: See PMH above  are also affecting patient's functional outcome.   REHAB POTENTIAL: Good  CLINICAL DECISION MAKING: Evolving/moderate complexity  EVALUATION COMPLEXITY: Moderate  PLAN:  PT  FREQUENCY: 2x/week  PT DURATION: 8 weeks including eval week  PLANNED INTERVENTIONS: Therapeutic exercises, Therapeutic activity, Neuromuscular re-education, Balance training, Gait training, Patient/Family education, Self Care, and DME instructions  PLAN FOR NEXT SESSION: 10th Visit Progress note.  Try to add postural sway ant/posterior in to HEP.  Start/stop with gait; SLS, hip stability, resisted activities.  Continue work on compliant surfaces.   Large amplitude movement patterns, gait with arm swing, step length, posture  Tahesha Skeet W., PT 03/24/2022, 3:17 PM   [facility] at [facility] [place], [place], [place] [address] Phone # [phone] Fax # [phone]

## 2022-03-26 ENCOUNTER — Ambulatory Visit: Payer: Medicare Other | Admitting: Physical Therapy

## 2022-03-26 ENCOUNTER — Encounter: Payer: Self-pay | Admitting: Physical Therapy

## 2022-03-26 DIAGNOSIS — R293 Abnormal posture: Secondary | ICD-10-CM | POA: Diagnosis not present

## 2022-03-26 DIAGNOSIS — R2681 Unsteadiness on feet: Secondary | ICD-10-CM

## 2022-03-26 DIAGNOSIS — R29818 Other symptoms and signs involving the nervous system: Secondary | ICD-10-CM | POA: Diagnosis not present

## 2022-03-26 DIAGNOSIS — R2689 Other abnormalities of gait and mobility: Secondary | ICD-10-CM

## 2022-03-26 NOTE — Therapy (Signed)
OUTPATIENT PHYSICAL THERAPY NEURO TREATMENT NOTE/10th Visit PROGRESS NOTE   Patient Name: Cody Dickerson MRN: [id number] DOB:June 22, 1950, 72 y.o., male Today's Date: 03/20/2022   PCP: Lawerance Cruel, MD REFERRING PROVIDER: Alonza Bogus, DO  Progress Note Reporting Period 02/17/2022 to 03/26/2022  See note below for Objective Data and Assessment of Progress/Goals.     END OF SESSION:  PT End of Session - 03/26/22 1359     Visit Number 10    Number of Visits 16    Date for PT Re-Evaluation 04/11/22    Authorization Type Medicare    PT Start Time 1400    PT Stop Time 1446    PT Time Calculation (min) 46 min    Equipment Utilized During Treatment --   will need gait belt due to fall risk   Activity Tolerance Patient tolerated treatment well    Behavior During Therapy WFL for tasks assessed/performed             Past Medical History:  Diagnosis Date   Balance problem    Gout    Hypercholesterolemia    Hypertension    Parkinson's disease    Small bowel obstruction ([place])    Past Surgical History:  Procedure Laterality Date   BOWEL BLOCKAGE  05/2019   IR ANGIO INTRA EXTRACRAN SEL COM CAROTID INNOMINATE BILAT MOD SED  10/03/2021   IR ANGIO VERTEBRAL SEL VERTEBRAL UNI R MOD SED  10/03/2021   IR RADIOLOGIST EVAL & MGMT  08/19/2021   IR US GUIDE VASC ACCESS RIGHT  10/04/2021   Patient Active Problem List   Diagnosis Date Noted   Parkinson's disease 02/06/2021   SBO (small bowel obstruction) ([place]) 07/06/2019    ONSET DATE: 02/11/2022 (MD referral)  REFERRING DIAG: G20.A1 (ICD-10-CM) - Parkinson's disease without dyskinesia or fluctuating manifestations   THERAPY DIAG:  Unsteadiness on feet  Other abnormalities of gait and mobility  Other symptoms and signs involving the nervous system  Abnormal posture  Rationale for Evaluation and Treatment: Rehabilitation  SUBJECTIVE:                                                                                                                                                                                              SUBJECTIVE STATEMENT: It's really warming up outside today.  Managed to get the exercises in.  Have been trying to pause upon standing.   Pt accompanied by: self  PERTINENT HISTORY: Parkinson's disease, ICA aneurysm (stable April 2022), MCI  PAIN:  Are you having pain? No  PRECAUTIONS: Fall  WEIGHT BEARING RESTRICTIONS: No  FALLS: Has patient fallen in last  6 months? Yes. Number of falls 2 (One fall was at night, one fall tripped over the laundry basket)  LIVING ENVIRONMENT: Lives with: lives with their spouse Lives in: House/apartment Stairs: Yes: External: 3 steps; on right going up Has following equipment at home: None  PLOF: Independent  Exercise routine:  Walks in the neighborhood daily, but that has been limited due to weather and balance.  Does the [employer] spin class on Thursdays  PATIENT GOALS: To get balance better  OBJECTIVE:    TODAY'S TREATMENT: 03/26/2022 Activity Comments  5x sit<>stand:  17.28 ; 2nd set 18.28 sec, no posterior lean 1 epsiode of posterior lean on 1st trial  TUG:  11.22 sec TUG cognitive:  14.40 10.56 sec, 11.59 sec at eval  60M:  8.34 sec = 3.93 ft/sec   MiniBESTest:  21/28 Improved from 19/28  Standing ant/posterior postural sway, 3 x 10 reps Cues for light UE support  Forward/back step and weigthshift 2 x 10 reps Cues for foot clearance, step length  Gait with quick stop/starts, gait with focused stride length, posture No overt LOB, better/more wide BOS with quick stops today   Access Code: [email or web address] URL: [email or web address] Date: 03/26/2022 Prepared by: [facility]  Program Notes Make sure to walk with TALL posture and relaxed ARM SWING*Forward/back step and weightshift:  Step big step forward, then big step Backward, 10 reps (you don't have to stop in the middle; make sure to clear your feet).   You'll repeat 10 times each side, standing near counter for support.  Exercises - Heel Toe Raises with Counter Support  - 1 x daily - 7 x weekly - 2 sets - 10 reps - 3 sec hold - Staggered Stance Forward Backward Weight Shift with Counter Support  - 1 x daily - 7 x weekly - 2 sets - 10 reps - Standing on Foam Pad  - 1 x daily - 7 x weekly - 1 sets - 5-10 reps - Standing Balance with Eyes Closed on Foam  - 1 x daily - 5 x weekly - 1 sets - 5-10 reps - Standing Anterior Posterior Weight Shift with Chair  - 1 x daily - 5 x weekly - 2 sets - 10 reps  PATIENT EDUCATION: Education details: HEP updates Person educated: Patient Education method: Explanation, Demonstration, and Handouts Education comprehension: verbalized understanding, returned demonstration, verbal cues required, and needs further education   ---------------------------------------------------------------------------------- Objective measures below taken at initial evaluation:  DIAGNOSTIC FINDINGS: per Dr. Carles Collet note:  DaTscan previously fairly equivocal  COGNITION: Overall cognitive status: Within functional limits for tasks assessed   SENSATION: Light touch: WFL  MUSCLE TONE: LLE: Mild  POSTURE: rounded shoulders and forward head  LOWER EXTREMITY ROM:   WFL for BLES   LOWER EXTREMITY MMT:  Grossly tested at least 4/5 with MMT   TRANSFERS: Assistive device utilized: None  Sit to stand: Modified independence Stand to sit: Modified independence  GAIT: Gait pattern: step through pattern, decreased arm swing- Left, and decreased step length- Left  Holds LUE flexed at elbow and gently fisted with gait during testing for eval; able to relax for arm swing when cued to do so. Distance walked: 50 ft x 4 Assistive device utilized: None Level of assistance: Modified independence Comments: 8.31 sec in 10 M walk:3.95 ft/sec  FUNCTIONAL TESTS:  5 times sit to stand: 13.44 sec without UE support Timed up and go (TUG):  10.56 MiniBESTest: 19/28 (scores <22/28 indicate  increased fall risk)  TUG cognitive:  11.59 sec (holds hands together in front of body) TUG manual:  10.63 (holds LUE close to body)   GOALS: Goals reviewed with patient? Yes  SHORT TERM GOALS: Target date: 03/14/2022  Pt will be independent with HEP for improved balance, gait. Baseline: Goal status: GOAL MET, 03/13/2022  2.  Pt will improve 5x sit<>stand to less than or equal to 12.5 sec to demonstrate improved functional strength and transfer efficiency. Baseline: 13.44 sec>13.94 sec at best with some posterior lean, 03/13/2022 Goal status: GOAL NOT MET, 03/13/2022  3.  Pt will verbalize understanding of fall prevention in home environment. Baseline:  Goal status: GOAL MET   LONG TERM GOALS: Target date: 04/11/2022  Pt will be independent with HEP for improved balance, gait. Baseline:  Goal status: IN PROGRESS  2.  Pt will improve MiniBESTest score to at least 22/28 to decrease fall risk. Baseline: 19/28>21/28 Goal status:GOAL PARTIALLY MET  3.  Pt will verbalize plans for continued community fitness upon d/c from PT. Baseline:  Goal status: IN PROGRESS   ASSESSMENT:  CLINICAL IMPRESSION: 10th Visit PROGRESS NOTE:  Pt reports he is doing HEP and reports he is remembering to pause upon standing before initiating gait.  Objective measures:  MiniBESTest:  21/28 (improved from 19/28).  5x sit<>stand:  17.58 sec and 18.58 sec (increased time, but decreased episode of posterior LOB); TUG score 11.22 sec and TUG cog 14.4 sec.  Pt overall is reporting and demonstrating improved attention to posture and stride length with gait.  With balance activities, he is beginning to activate weightshifting more anteriorly to help decrease posterior tendencies with balance.  He remains at fall risk with MiniBESTest, but he has improved score by 2 points since evaluation.  He will continue to benefit from skilled PT towards remaining LTGs, for improved  overall functional mobility and decreased fall risk.    OBJECTIVE IMPAIRMENTS: Abnormal gait, decreased balance, decreased coordination, decreased knowledge of use of DME, decreased mobility, difficulty walking, decreased strength, and postural dysfunction.   ACTIVITY LIMITATIONS: bending, standing, squatting, transfers, and locomotion level  PARTICIPATION LIMITATIONS: community activity and yard work  PERSONAL FACTORS: 3+ comorbidities: See PMH above  are also affecting patient's functional outcome.   REHAB POTENTIAL: Good  CLINICAL DECISION MAKING: Evolving/moderate complexity  EVALUATION COMPLEXITY: Moderate  PLAN:  PT FREQUENCY: 2x/week  PT DURATION: 8 weeks including eval week  PLANNED INTERVENTIONS: Therapeutic exercises, Therapeutic activity, Neuromuscular re-education, Balance training, Gait training, Patient/Family education, Self Care, and DME instructions  PLAN FOR NEXT SESSION: Review added exercises to HEP.  Start/stop with gait; SLS, hip stability, resisted activities.  Continue work on compliant surfaces.   Plan for likely discharge next week.  Schedule PT, OT, speech screens upon d/c (would like to have them scheduled at [facility])  Frazier Butt., PT 03/26/2022, 3:04 PM   [facility] at [facility] [place], [place], [place] [address] Phone # [phone] Fax # [phone]

## 2022-03-31 ENCOUNTER — Encounter: Payer: Self-pay | Admitting: Physical Therapy

## 2022-03-31 ENCOUNTER — Ambulatory Visit: Payer: Medicare Other | Admitting: Physical Therapy

## 2022-03-31 DIAGNOSIS — R29818 Other symptoms and signs involving the nervous system: Secondary | ICD-10-CM | POA: Diagnosis not present

## 2022-03-31 DIAGNOSIS — R2681 Unsteadiness on feet: Secondary | ICD-10-CM | POA: Diagnosis not present

## 2022-03-31 DIAGNOSIS — R2689 Other abnormalities of gait and mobility: Secondary | ICD-10-CM

## 2022-03-31 DIAGNOSIS — R293 Abnormal posture: Secondary | ICD-10-CM

## 2022-03-31 NOTE — Therapy (Signed)
OUTPATIENT PHYSICAL THERAPY NEURO TREATMENT NOTE   Patient Name: Cody Dickerson MRN: [id number] DOB:1950/10/06, 72 y.o., male Today's Date: 03/20/2022   PCP: Lawerance Cruel, MD REFERRING PROVIDER: Alonza Bogus, DO    END OF SESSION:  PT End of Session - 03/31/22 1020     Visit Number 11    Number of Visits 16    Date for PT Re-Evaluation 04/11/22    Authorization Type Medicare    PT Start Time 1020    PT Stop Time 1102    PT Time Calculation (min) 42 min    Equipment Utilized During Treatment --   will need gait belt due to fall risk   Activity Tolerance Patient tolerated treatment well    Behavior During Therapy WFL for tasks assessed/performed             Past Medical History:  Diagnosis Date   Balance problem    Gout    Hypercholesterolemia    Hypertension    Parkinson's disease    Small bowel obstruction ([place])    Past Surgical History:  Procedure Laterality Date   BOWEL BLOCKAGE  05/2019   IR ANGIO INTRA EXTRACRAN SEL COM CAROTID INNOMINATE BILAT MOD SED  10/03/2021   IR ANGIO VERTEBRAL SEL VERTEBRAL UNI R MOD SED  10/03/2021   IR RADIOLOGIST EVAL & MGMT  08/19/2021   IR US GUIDE VASC ACCESS RIGHT  10/04/2021   Patient Active Problem List   Diagnosis Date Noted   Parkinson's disease 02/06/2021   SBO (small bowel obstruction) ([place]) 07/06/2019    ONSET DATE: 02/11/2022 (MD referral)  REFERRING DIAG: G20.A1 (ICD-10-CM) - Parkinson's disease without dyskinesia or fluctuating manifestations   THERAPY DIAG:  Unsteadiness on feet  Other abnormalities of gait and mobility  Abnormal posture  Other symptoms and signs involving the nervous system  Rationale for Evaluation and Treatment: Rehabilitation  SUBJECTIVE:                                                                                                                                                                                             SUBJECTIVE STATEMENT: Things going pretty well.  Did the  new exercises without trouble.  Pt accompanied by: self  PERTINENT HISTORY: Parkinson's disease, ICA aneurysm (stable April 2022), MCI  PAIN:  Are you having pain? No  PRECAUTIONS: Fall  WEIGHT BEARING RESTRICTIONS: No  FALLS: Has patient fallen in last 6 months? Yes. Number of falls 2 (One fall was at night, one fall tripped over the laundry basket)  LIVING ENVIRONMENT: Lives with: lives with their spouse Lives in: House/apartment Stairs: Yes: External:  3 steps; on right going up Has following equipment at home: None  PLOF: Independent  Exercise routine:  Walks in the neighborhood daily, but that has been limited due to weather and balance.  Does the PD spin class on Thursdays  PATIENT GOALS: To get balance better  OBJECTIVE:    TODAY'S TREATMENT: 03/31/2022 Activity Comments  Reviewed full HEP, with pt return demo understanding of new and existing HEP   Up on toes, 3 sec, x 3 Light UE support  Hamstring stretch, 3 x 30" Runner's stretch/gastroc stretch 3 x 30" Seated forward lean to upright posture (PWR! Up) x 5 reps Initial cues for posture and technique  Forward gait with turns x 2 minutes; Forward/back gait x 2 minutes (cues to slow pace posterior direction), gait with start/stops, good balance   Gait with dual tasking:  ball toss and catch Slowed pace of gait  Monster walk with conversation tasks, cues for slowed pace and increased SLS time    Access Code: [email or web address] URL: [email or web address] Date: 03/31/2022 Prepared by: [facility]  Program Notes Make sure to walk with TALL posture and relaxed ARM SWING*Forward/back step and weightshift:  Step big step forward, then big step Backward, 10 reps (you don't have to stop in the middle; make sure to clear your feet).  You'll repeat 10 times each side, standing near counter for support.  Exercises - Heel Toe Raises with Counter Support  - 1 x daily - 7 x weekly - 2 sets  - 10 reps - 3 sec hold - Staggered Stance Forward Backward Weight Shift with Counter Support  - 1 x daily - 7 x weekly - 2 sets - 10 reps - Standing on Foam Pad  - 1 x daily - 7 x weekly - 1 sets - 5-10 reps - Standing Balance with Eyes Closed on Foam  - 1 x daily - 5 x weekly - 1 sets - 5-10 reps - Standing Anterior Posterior Weight Shift with Chair  - 1 x daily - 5 x weekly - 2 sets - 10 reps - Seated Hamstring Stretch  - 1 x daily - 7 x weekly - 1 sets - 3 reps - 30 sec hold - Standing Gastroc Stretch at Counter  - 1 x daily - 7 x weekly - 1 sets - 3 reps - 30 hold -Seated PWR! Up, 5-10 reps, 1x/day  PATIENT EDUCATION: Education details: Optimal exercise routine options, additions to HEP for flexibility/stretching to add to HEP Person educated: Patient Education method: Consulting civil engineer, Demonstration, Verbal cues, and Handouts Education comprehension: verbalized understanding and returned demonstration   ---------------------------------------------------------------------------------- Objective measures below taken at initial evaluation:  DIAGNOSTIC FINDINGS: per Dr. Carles Collet note:  DaTscan previously fairly equivocal  COGNITION: Overall cognitive status: Within functional limits for tasks assessed   SENSATION: Light touch: WFL  MUSCLE TONE: LLE: Mild  POSTURE: rounded shoulders and forward head  LOWER EXTREMITY ROM:   WFL for BLES   LOWER EXTREMITY MMT:  Grossly tested at least 4/5 with MMT   TRANSFERS: Assistive device utilized: None  Sit to stand: Modified independence Stand to sit: Modified independence  GAIT: Gait pattern: step through pattern, decreased arm swing- Left, and decreased step length- Left  Holds LUE flexed at elbow and gently fisted with gait during testing for eval; able to relax for arm swing when cued to do so. Distance walked: 50 ft x 4 Assistive device utilized: None Level of assistance: Modified independence Comments:  8.31 sec in 10 M walk:3.95  ft/sec  FUNCTIONAL TESTS:  5 times sit to stand: 13.44 sec without UE support Timed up and go (TUG): 10.56 MiniBESTest: 19/28 (scores <22/28 indicate increased fall risk)  TUG cognitive:  11.59 sec (holds hands together in front of body) TUG manual:  10.63 (holds LUE close to body)   GOALS: Goals reviewed with patient? Yes  SHORT TERM GOALS: Target date: 03/14/2022  Pt will be independent with HEP for improved balance, gait. Baseline: Goal status: GOAL MET, 03/13/2022  2.  Pt will improve 5x sit<>stand to less than or equal to 12.5 sec to demonstrate improved functional strength and transfer efficiency. Baseline: 13.44 sec>13.94 sec at best with some posterior lean, 03/13/2022 Goal status: GOAL NOT MET, 03/13/2022  3.  Pt will verbalize understanding of fall prevention in home environment. Baseline:  Goal status: GOAL MET   LONG TERM GOALS: Target date: 04/11/2022  Pt will be independent with HEP for improved balance, gait. Baseline:  Goal status: IN PROGRESS  2.  Pt will improve MiniBESTest score to at least 22/28 to decrease fall risk. Baseline: 19/28>21/28 Goal status:GOAL PARTIALLY MET  3.  Pt will verbalize plans for continued community fitness upon d/c from PT. Baseline:  Goal status: IN PROGRESS   ASSESSMENT:  CLINICAL IMPRESSION: Skilled PT session today focused on review of current HEP and discussion of optimal fitness program upon d/c.  Pt is already doing aerobic activity with cycling class 1 day/wk and advised to add 1-2 additional days of aerobic activity.  He is walking daily when weather permits x 20 minutes.  He is performing HEP for balance; added flexibility/stretching exercises to his routine, educating pt to perform daily.  He is progressing well towards goals and anticipate discharge next visit.  OBJECTIVE IMPAIRMENTS: Abnormal gait, decreased balance, decreased coordination, decreased knowledge of use of DME, decreased mobility, difficulty walking,  decreased strength, and postural dysfunction.   ACTIVITY LIMITATIONS: bending, standing, squatting, transfers, and locomotion level  PARTICIPATION LIMITATIONS: community activity and yard work  PERSONAL FACTORS: 3+ comorbidities: See PMH above  are also affecting patient's functional outcome.   REHAB POTENTIAL: Good  CLINICAL DECISION MAKING: Evolving/moderate complexity  EVALUATION COMPLEXITY: Moderate  PLAN:  PT FREQUENCY: 2x/week  PT DURATION: 8 weeks including eval week  PLANNED INTERVENTIONS: Therapeutic exercises, Therapeutic activity, Neuromuscular re-education, Balance training, Gait training, Patient/Family education, Self Care, and DME instructions  PLAN FOR NEXT SESSION: Check remaining LTGs, updated HEP and discharge next visit.  Schedule PT, OT, speech screens upon d/c (would like to have them scheduled at [facility])  Frazier Butt., PT 03/31/2022, 11:54 AM   [facility] at [facility] [place], [address] [place], [place] [address] Phone # [phone] Fax # [phone]

## 2022-04-03 ENCOUNTER — Ambulatory Visit: Payer: Medicare Other | Attending: Neurology | Admitting: Physical Therapy

## 2022-04-03 ENCOUNTER — Encounter: Payer: Self-pay | Admitting: Physical Therapy

## 2022-04-03 DIAGNOSIS — R2689 Other abnormalities of gait and mobility: Secondary | ICD-10-CM | POA: Diagnosis not present

## 2022-04-03 DIAGNOSIS — R2681 Unsteadiness on feet: Secondary | ICD-10-CM | POA: Diagnosis not present

## 2022-04-03 DIAGNOSIS — R29818 Other symptoms and signs involving the nervous system: Secondary | ICD-10-CM | POA: Diagnosis not present

## 2022-04-03 DIAGNOSIS — R293 Abnormal posture: Secondary | ICD-10-CM | POA: Insufficient documentation

## 2022-04-03 NOTE — Therapy (Signed)
OUTPATIENT PHYSICAL THERAPY NEURO TREATMENT NOTE/DISCHARGE SUMMARY   Patient Name: Cody Dickerson MRN: [id number] DOB:[birth year], 72 y.o., male Today's Date: 03/20/2022   PCP: Lawerance Cruel, MD REFERRING PROVIDER: Alonza Bogus, DO   PHYSICAL THERAPY DISCHARGE SUMMARY  Visits from Start of Care: 12  Current functional level related to goals / functional outcomes: Pt has met 2 of 3 LTGs-see below   Remaining deficits: High level balance, retropulsion at times with sit<>stand   Education / Equipment: Educated in [employer], fall prevention community fitness   Patient agrees to discharge. Patient goals were partially met. Patient is being discharged due to being pleased with the current functional level.  END OF SESSION:  PT End of Session - 04/03/22 1014     Visit Number 12    Number of Visits 16    Date for PT Re-Evaluation 04/11/22    Authorization Type Medicare    PT Start Time 1016    PT Stop Time 1044    PT Time Calculation (min) 28 min    Equipment Utilized During Treatment Gait belt   will need gait belt due to fall risk   Activity Tolerance Patient tolerated treatment well    Behavior During Therapy WFL for tasks assessed/performed             Past Medical History:  Diagnosis Date   Balance problem    Gout    Hypercholesterolemia    Hypertension    Parkinson's disease    Small bowel obstruction ([place])    Past Surgical History:  Procedure Laterality Date   BOWEL BLOCKAGE  05/2019   IR ANGIO INTRA EXTRACRAN SEL COM CAROTID INNOMINATE BILAT MOD SED  10/03/2021   IR ANGIO VERTEBRAL SEL VERTEBRAL UNI R MOD SED  10/03/2021   IR RADIOLOGIST EVAL & MGMT  08/19/2021   IR US GUIDE VASC ACCESS RIGHT  10/04/2021   Patient Active Problem List   Diagnosis Date Noted   Parkinson's disease 02/06/2021   SBO (small bowel obstruction) ([place]) 07/06/2019    ONSET DATE: 02/11/2022 (MD referral)  REFERRING DIAG: G20.A1 (ICD-10-CM) - Parkinson's disease without dyskinesia or  fluctuating manifestations   THERAPY DIAG:  Unsteadiness on feet  Other abnormalities of gait and mobility  Abnormal posture  Other symptoms and signs involving the nervous system  Rationale for Evaluation and Treatment: Rehabilitation  SUBJECTIVE:                                                                                                                                                                                             SUBJECTIVE STATEMENT: Not much new.  Feel  like my knowledge of getting started, getting moving is better.  I'm doing the exercises, still feel like my balance is still not great.  Pt accompanied by: self  PERTINENT HISTORY: Parkinson's disease, ICA aneurysm (stable April 2022), MCI  PAIN:  Are you having pain? No  PRECAUTIONS: Fall  WEIGHT BEARING RESTRICTIONS: No  FALLS: Has patient fallen in last 6 months? Yes. Number of falls 2 (One fall was at night, one fall tripped over the laundry basket)  LIVING ENVIRONMENT: Lives with: lives with their spouse Lives in: House/apartment Stairs: Yes: External: 3 steps; on right going up Has following equipment at home: None  PLOF: Independent  Exercise routine:  Walks in the neighborhood daily, but that has been limited due to weather and balance.  Does the PD spin class on Thursdays  PATIENT GOALS: To get balance better  OBJECTIVE:    TODAY'S TREATMENT: 04/03/2022 Activity Comments  5x sit<>stand:  16.97 sec  Minimal posterior bias  TUG:  9.66 sec   Gait velocity:  7.44 sec = 4.40 ft/sec   Reviewed stretches added to HEP last visit Pt return demo understanding.  Practiced sit<>stand multiple reps with cues and practice for increased forward lean and full weight through anterior portion of feet   Sit<>stand from low mat surface, simulating sofa, 6 reps Cues for scoot, forward lean   PATIENT EDUCATION: Education details: Discussed POC, progress towards goals.  Discussed continued community  fitness-spin class, walking in neighborhood.  Reminded patient of other community PD fitness options  Person educated: Patient Education method: Explanation Education comprehension: verbalized understanding     Access Code: [email or web address] URL: [email or web address] Date: 03/31/2022 Prepared by: [facility]  Program Notes Make sure to walk with TALL posture and relaxed ARM SWING*Forward/back step and weightshift:  Step big step forward, then big step Backward, 10 reps (you don't have to stop in the middle; make sure to clear your feet).  You'll repeat 10 times each side, standing near counter for support.  Exercises - Heel Toe Raises with Counter Support  - 1 x daily - 7 x weekly - 2 sets - 10 reps - 3 sec hold - Staggered Stance Forward Backward Weight Shift with Counter Support  - 1 x daily - 7 x weekly - 2 sets - 10 reps - Standing on Foam Pad  - 1 x daily - 7 x weekly - 1 sets - 5-10 reps - Standing Balance with Eyes Closed on Foam  - 1 x daily - 5 x weekly - 1 sets - 5-10 reps - Standing Anterior Posterior Weight Shift with Chair  - 1 x daily - 5 x weekly - 2 sets - 10 reps - Seated Hamstring Stretch  - 1 x daily - 7 x weekly - 1 sets - 3 reps - 30 sec hold - Standing Gastroc Stretch at Counter  - 1 x daily - 7 x weekly - 1 sets - 3 reps - 30 hold -Seated PWR! Up, 5-10 reps, 1x/day   ---------------------------------------------------------------------------------- Objective measures below taken at initial evaluation:  DIAGNOSTIC FINDINGS: per Dr. Carles Collet note:  DaTscan previously fairly equivocal  COGNITION: Overall cognitive status: Within functional limits for tasks assessed   SENSATION: Light touch: WFL  MUSCLE TONE: LLE: Mild  POSTURE: rounded shoulders and forward head  LOWER EXTREMITY ROM:   WFL for BLES   LOWER EXTREMITY MMT:  Grossly tested at least 4/5 with MMT   TRANSFERS: Assistive device utilized: None  Sit to  stand: Modified independence Stand to sit: Modified independence  GAIT: Gait pattern: step through pattern, decreased arm swing- Left, and decreased step length- Left  Holds LUE flexed at elbow and gently fisted with gait during testing for eval; able to relax for arm swing when cued to do so. Distance walked: 50 ft x 4 Assistive device utilized: None Level of assistance: Modified independence Comments: 8.31 sec in 10 M walk:3.95 ft/sec  FUNCTIONAL TESTS:  5 times sit to stand: 13.44 sec without UE support Timed up and go (TUG): 10.56 MiniBESTest: 19/28 (scores <22/28 indicate increased fall risk)  TUG cognitive:  11.59 sec (holds hands together in front of body) TUG manual:  10.63 (holds LUE close to body)   GOALS: Goals reviewed with patient? Yes  SHORT TERM GOALS: Target date: 03/14/2022  Pt will be independent with HEP for improved balance, gait. Baseline: Goal status: GOAL MET, 03/13/2022  2.  Pt will improve 5x sit<>stand to less than or equal to 12.5 sec to demonstrate improved functional strength and transfer efficiency. Baseline: 13.44 sec>13.94 sec at best with some posterior lean, 03/13/2022 Goal status: GOAL NOT MET, 03/13/2022  3.  Pt will verbalize understanding of fall prevention in home environment. Baseline:  Goal status: GOAL MET   LONG TERM GOALS: Target date: 04/11/2022  Pt will be independent with HEP for improved balance, gait. Baseline:  Goal status: GOAL MET, 04/03/2022  2.  Pt will improve MiniBESTest score to at least 22/28 to decrease fall risk. Baseline: 19/28>21/28 Goal status:GOAL PARTIALLY MET  3.  Pt will verbalize plans for continued community fitness upon d/c from PT. Baseline:  Goal status:GOAL MET, 04/03/2022   ASSESSMENT:  CLINICAL IMPRESSION: Assessed LTGs this visit, pt meeting 2 of 3 LTGs.  Pt has met LTG 1 and 3.  Pt has partially met LTG 2, improving MiniBESTest to 21/28 from 19/28 at eval, just not to goal level.  Pt continues to  have some retropulsion bias with sit<>stand, despite cues for optimal positioning.  He has had no falls but continues to report balance difficulty.  Pt has comprehensive HEP for balance, posture, flexibility.  He is appropriate for discharge at this time.    OBJECTIVE IMPAIRMENTS: Abnormal gait, decreased balance, decreased coordination, decreased knowledge of use of DME, decreased mobility, difficulty walking, decreased strength, and postural dysfunction.   ACTIVITY LIMITATIONS: bending, standing, squatting, transfers, and locomotion level  PARTICIPATION LIMITATIONS: community activity and yard work  PERSONAL FACTORS: 3+ comorbidities: See PMH above  are also affecting patient's functional outcome.   REHAB POTENTIAL: Good  CLINICAL DECISION MAKING: Evolving/moderate complexity  EVALUATION COMPLEXITY: Moderate  PLAN:  PT FREQUENCY: 2x/week  PT DURATION: 8 weeks including eval week  PLANNED INTERVENTIONS: Therapeutic exercises, Therapeutic activity, Neuromuscular re-education, Balance training, Gait training, Patient/Family education, Self Care, and DME instructions  PLAN FOR NEXT SESSION: Discharge this visit, with plans for PT, OT, speech screens in 6-9 months due to progressive nature of disease.  Frazier Butt., PT 04/03/2022, 11:00 AM   [place] [facility] at [facility] [place], [address] [place], [place] [address] Phone # [phone] Fax # [phone]

## 2022-04-29 ENCOUNTER — Ambulatory Visit: Payer: Medicare Other | Admitting: Physical Therapy

## 2022-04-29 ENCOUNTER — Ambulatory Visit: Payer: Medicare Other

## 2022-04-29 ENCOUNTER — Ambulatory Visit: Payer: Medicare Other | Admitting: Occupational Therapy

## 2022-05-19 ENCOUNTER — Encounter: Payer: Self-pay | Admitting: Neurology

## 2022-05-28 DIAGNOSIS — I10 Essential (primary) hypertension: Secondary | ICD-10-CM | POA: Diagnosis not present

## 2022-05-28 DIAGNOSIS — R635 Abnormal weight gain: Secondary | ICD-10-CM | POA: Diagnosis not present

## 2022-05-28 DIAGNOSIS — R2689 Other abnormalities of gait and mobility: Secondary | ICD-10-CM | POA: Diagnosis not present

## 2022-05-28 DIAGNOSIS — L84 Corns and callosities: Secondary | ICD-10-CM | POA: Diagnosis not present

## 2022-05-28 DIAGNOSIS — Z6825 Body mass index (BMI) 25.0-25.9, adult: Secondary | ICD-10-CM | POA: Diagnosis not present

## 2022-06-04 ENCOUNTER — Ambulatory Visit (INDEPENDENT_AMBULATORY_CARE_PROVIDER_SITE_OTHER): Payer: Medicare Other | Admitting: Podiatry

## 2022-06-04 ENCOUNTER — Encounter: Payer: Self-pay | Admitting: Podiatry

## 2022-06-04 DIAGNOSIS — Q828 Other specified congenital malformations of skin: Secondary | ICD-10-CM | POA: Insufficient documentation

## 2022-06-04 NOTE — Progress Notes (Signed)
This patient presents to the office for treatment of painful callus under the ball of his right foot.  He says this is painful walking and wearing his shoes.  He was referred to the office by his doctor since he has Parkinsons.  He presents to the office for evaluation and treatment.  Vascular  Dorsalis pedis and posterior tibial pulses are palpable  B/L.  Capillary return  WNL.  Temperature gradient is  WNL.  Skin turgor  WNL  Sensorium  Senn Weinstein monofilament wire  WNL. Normal tactile sensation.  Nail Exam  Patient has normal nails with no evidence of bacterial or fungal infection.  Orthopedic  Exam  Muscle tone and muscle strength  WNL.  No limitations of motion feet  B/L.  No crepitus or joint effusion noted.  Foot type is unremarkable and digits show no abnormalities.  Bony prominences are unremarkable.  Skin  No open lesions.  Normal skin texture and turgor. Porokeratosis sub 1  right foot.  Porokeratosis right foot  IE.  Debride porokeratosis with # 15 blade and dremel tool.  RTC 3 months.   Gardiner Barefoot DPM

## 2022-08-05 ENCOUNTER — Ambulatory Visit: Payer: Medicare Other

## 2022-08-05 DIAGNOSIS — I6523 Occlusion and stenosis of bilateral carotid arteries: Secondary | ICD-10-CM | POA: Diagnosis not present

## 2022-08-05 NOTE — Progress Notes (Signed)
Assessment/Plan:   1.  Parkinsons Disease  -DaTscan previously fairly equivocal, but has met clinical criteria now  -Continue carbidopa/levodopa 25/100, 2/1/1   -Continue pramipexole 0.5 mg 3 times per day.  -discussed using cane as he feels unsteady.  Discussed however, that this won't help when he bends over and picks up gumballs outside.  This is just not a recommended activity and will likely cause falls due to Parkinsons Disease and OH  2.  Left distal supraclinoid ICA aneurysm  -CTA was repeated in April, 2022 and aneurysm size was stable (small, 2mm).   -resend referral to dr deveshwar again for opinion  3.  MCI  -Neurocognitive testing done with Dr. Roseanne Reno in September, 2022 demonstrated MCI.  I don't see any progression  4.  Dizziness, improved  -don't think that this is related to Parkinsons Disease or meds, as I d/c the carbidopa/levodopa and it didn't change sx's.   -Dr. Jacinto Halim has discontinued his amlodipine due to significant orthostasis in his office.   Subjective:   Cody Dickerson was seen today in follow up for Parkinsons disease.  My previous records were reviewed prior to todays visit as well as outside records available to me. Pt with wife who supplements hx.   patient remains on levodopa and pramipexole.  He did email me in March stating that his balance was not good and he had some falls.  We discussed that physical therapy is really the only thing that helps balance, but he was hoping to increase the levodopa.  I told him that this does not help and would not be an indication for increasing levodopa.  He did finish physical therapy in early February.  He did see Dr. Jacinto Halim not long after I saw him and patient was profoundly orthostatic and amlodipine was discontinued.  He states that his last fall was last week - he fell over a branch in the yard.  The prior fall was many months ago.  He just feels unsteady, esp when picking up gumballs outside and he likes to do  this.  He has no hallucinations.  No near syncope.  Current prescribed movement disorder medications: carbidopa/levodopa 25/100, 2/1/1 Pramipexole, 0.5 mg 3 times per day (started last visit)  Previous meds:  lexapro (took for a few days and felt it caused decreased energy)  ALLERGIES:   Allergies  Allergen Reactions   Escitalopram     felt weird and tired   Lisinopril Cough    CURRENT MEDICATIONS:  Outpatient Encounter Medications as of 08/18/2022  Medication Sig   carbidopa-levodopa (SINEMET IR) 25-100 MG tablet 2 at 7am, 1 at 11am, 1 at 4pm   ezetimibe (ZETIA) 10 MG tablet Take 1 tablet (10 mg total) by mouth daily.   indomethacin (INDOCIN) 50 MG capsule Take 50 mg by mouth 3 (three) times daily as needed for moderate pain (gout pain).   pramipexole (MIRAPEX) 0.5 MG tablet Take 1 tablet (0.5 mg total) by mouth 3 (three) times daily.   rosuvastatin (CRESTOR) 20 MG tablet Take 20 mg by mouth daily.    vitamin B-12 (CYANOCOBALAMIN) 500 MCG tablet Take 500 mcg by mouth daily.   No facility-administered encounter medications on file as of 08/18/2022.    Objective:   PHYSICAL EXAMINATION:    VITALS:   Vitals:   08/18/22 1108  BP: 112/66  Pulse: 70  SpO2: 97%  Weight: 161 lb 9.6 oz (73.3 kg)  Height: 5\' 8"  (1.727 m)    GEN:  The patient appears stated age and is in NAD. HEENT:  Normocephalic, atraumatic.  The mucous membranes are moist. The superficial temporal arteries are without ropiness or tenderness. CV:  RRR Lungs:  CTAB Neck/HEME:  There are no carotid bruits bilaterally.  Neurological examination:  Orientation: The patient is alert and oriented x3. Cranial nerves: There is good facial symmetry with facial hypomimia. The speech is fluent and clear. Soft palate rises symmetrically and there is no tongue deviation. Hearing is intact to conversational tone. Sensation: Sensation is intact to light touch throughout Motor: Strength is at least antigravity  x4.  Movement examination: Tone: There is nl tone today Abnormal movements: none Coordination:  There is mild decremation, with any form of RAMS, including alternating supination and pronation of the forearm, hand opening and closing, finger taps, heel taps and toe taps bilaterally Gait and Station: The patient pushes off to arise. The patient's stride length is good with mild decreased arm swing on the L  Cc:  Daisy Floro, MD

## 2022-08-12 ENCOUNTER — Ambulatory Visit: Payer: Medicare Other | Admitting: Cardiology

## 2022-08-12 ENCOUNTER — Encounter: Payer: Self-pay | Admitting: Cardiology

## 2022-08-12 DIAGNOSIS — I951 Orthostatic hypotension: Secondary | ICD-10-CM | POA: Diagnosis not present

## 2022-08-12 DIAGNOSIS — I6521 Occlusion and stenosis of right carotid artery: Secondary | ICD-10-CM

## 2022-08-12 DIAGNOSIS — I10 Essential (primary) hypertension: Secondary | ICD-10-CM

## 2022-08-12 DIAGNOSIS — E7800 Pure hypercholesterolemia, unspecified: Secondary | ICD-10-CM

## 2022-08-12 MED ORDER — EZETIMIBE 10 MG PO TABS
10.0000 mg | ORAL_TABLET | Freq: Every day | ORAL | 3 refills | Status: AC
Start: 2022-08-12 — End: 2023-06-21

## 2022-08-12 NOTE — Progress Notes (Signed)
Primary Physician/Referring:  Daisy Floro, MD  Patient ID: Cody Dickerson, male    DOB: Aug 06, 1950, 72 y.o.   MRN: [id number]  Chief Complaint  Patient presents with   Bilateral carotid artery stenosis   HPI:    Cody Dickerson  is a 72 y.o. Fairly active Caucasian male patient with supine hypertension, orthostatic hypotension, Parkinson's disease, asymptomatic bilateral carotid artery stenosis, hypercholesterolemia who was last seen in our office 6 months ago presents for follow-up of supine hypertension and orthostatic hypotension, carotid stenosis and hypercholesterolemia.  His main complaint continues to be gait instability, feeling imbalance. Otherwise from cardiac standpoint he has no other specific complaints.  He has not had any syncope or chest pain or dyspnea.  Past Medical History:  Diagnosis Date   Balance problem    Gout    Hypercholesterolemia    Hypertension    Parkinson's disease    Small bowel obstruction (HCC)    Past Surgical History:  Procedure Laterality Date   BOWEL BLOCKAGE  05/2019   IR ANGIO INTRA EXTRACRAN SEL COM CAROTID INNOMINATE BILAT MOD SED  10/03/2021   IR ANGIO VERTEBRAL SEL VERTEBRAL UNI R MOD SED  10/03/2021   IR RADIOLOGIST EVAL & MGMT  08/19/2021   IR US GUIDE VASC ACCESS RIGHT  10/04/2021    Social History   Tobacco Use   Smoking status: Former    Packs/day: 0.50    Years: 5.00    Additional pack years: 0.00    Total pack years: 2.50    Types: Cigarettes    Quit date: 1980    Years since quitting: 44.4   Smokeless tobacco: Never  Substance Use Topics   Alcohol use: Yes    Comment: 2 glasses wine/day prior to bowel surgery; since bowel surger 05/2019 - only 2 glasses since  Marital Status: Married   ROS  Review of Systems  Cardiovascular:  Negative for chest pain, dyspnea on exertion and leg swelling.  Neurological:  Positive for dizziness.   Objective  Blood pressure 130/76, pulse (!) 58, resp. rate 16, height 5\' 8"  (1.727  m), weight 164 lb (74.4 kg), SpO2 98 %. Body mass index is 24.94 kg/m.     08/12/2022   11:43 AM 02/13/2022   10:26 AM 02/11/2022   11:13 AM  Vitals with BMI  Height 5\' 8"  5\' 8"  5\' 8"   Weight 164 lbs 161 lbs 13 oz 161 lbs 3 oz  BMI 24.94 24.61 24.52  Systolic 130 130 829  Diastolic 76 70 82  Pulse 58 56 68   Orthostatic VS for the past 72 hrs (Last 3 readings):  Orthostatic BP Patient Position BP Location Cuff Size  08/12/22 1238 130/70 Standing -- --  08/12/22 1237 154/70 Sitting -- --  08/12/22 1143 -- Standing Left Arm Large    Physical Exam Vitals reviewed.  Neck:     Vascular: Carotid bruit (Right) present. No JVD.  Cardiovascular:     Rate and Rhythm: Normal rate and regular rhythm.     Pulses: Normal pulses and intact distal pulses.     Heart sounds: S1 normal and S2 normal. Murmur heard.     Early systolic murmur is present with a grade of 1/6 at the upper right sternal border.     No gallop.  Pulmonary:     Effort: Pulmonary effort is normal.     Breath sounds: Normal breath sounds.  Musculoskeletal:     Right lower leg: Edema (trace ankle)  present.     Left lower leg: Edema (trace ankle) present.     Laboratory examination:   Lab Results  Component Value Date   NA 140 10/03/2021   K 3.6 10/03/2021   CO2 26 10/03/2021   GLUCOSE 88 10/03/2021   BUN 15 10/03/2021   CREATININE 1.03 10/03/2021   CALCIUM 9.3 10/03/2021   GFRNONAA >60 10/03/2021       Latest Ref Rng & Units 10/03/2021    7:17 AM  CMP  Glucose 70 - 99 mg/dL 88   BUN 8 - 23 mg/dL 15   Creatinine 0.86 - 1.24 mg/dL 5.78   Sodium 469 - 629 mmol/L 140   Potassium 3.5 - 5.1 mmol/L 3.6   Chloride 98 - 111 mmol/L 107   CO2 22 - 32 mmol/L 26   Calcium 8.9 - 10.3 mg/dL 9.3       Latest Ref Rng & Units 10/03/2021    7:17 AM  CBC  WBC 4.0 - 10.5 K/uL 5.1   Hemoglobin 13.0 - 17.0 g/dL 52.8   Hematocrit 41.3 - 52.0 % 38.9   Platelets 150 - 400 K/uL 134    External labs:   Cholesterol, total  173.000 m 01/28/2022 HDL 84.000 mg 01/28/2022 LDL 83.000 mg 01/16/2021 Triglycerides 59.000 mg 01/28/2022  Cholesterol, total 173.000 m 01/28/2022 HDL 84.000 mg 01/28/2022 LDL 83.000 mg 01/16/2021 Triglycerides 59.000 mg 01/28/2022 Non-HDL cholesterol 92.  TSH 0.920 08/13/2020   Radiology:   Extracranial and intracranial cerebral angiogram 10/03/2021: 1. The right internal carotid artery at the bulb and just distally demonstrates a complex irregular atherosclerotic plaque associated with a stenosis of approximately 60% by the NASCET criteria. 2. The left internal carotid artery at the bulb to the cranial skull base is widely patent. 3.  Bilateral vertebral arteries are widely patent. 4.  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  Cardiac Studies:   PCV ECHOCARDIOGRAM COMPLETE 09/21/2020  Narrative Echocardiogram 09/21/2020: Normal LV systolic function with visual EF 60-65%. Left ventricle cavity is normal in size. Mild left ventricular hypertrophy. Normal global wall motion. Normal diastolic filling pattern, normal LAP. Left atrial cavity is mildly dilated. A lipomatous septum is present. Mild (Grade I) mitral regurgitation. Mild tricuspid regurgitation. No evidence of pulmonary hypertension. Moderate pulmonic regurgitation. No prior study for comparison.    Exercise treadmill stress test 10/05/2020: Exercise treadmill stress test performed using Bruce protocol.  Patient reached 7.5 METS, and 68% of age predicted maximum heart rate.  Exercise capacity was fair.  No chest pain reported.  Submaximal heart rate and normal blood pressure response. Stress EKG revealed no ischemic changes. Inconclusive study due to submaximal heart rate response.   Carotid artery duplex 08/05/2022: Duplex suggests stenosis in the right internal carotid artery (50-69%). No evidence of significant stenosis in the left carotid vessels. Antegrade right vertebral artery flow. Antegrade left  vertebral artery flow. No significant change compared to 10/09/2021. Follow up in six months is appropriate if clinically indicated.  EKG:   EKG 08/12/2022: Normal sinus rhythm at rate of 53 bpm, normal EKG.  Compared to 02/13/2022, no change.  Allergies   Allergies  Allergen Reactions   Escitalopram     felt weird and tired   Lisinopril Cough    Current Outpatient Medications:    carbidopa-levodopa (SINEMET IR) 25-100 MG tablet, 2 at 7am, 1 at 11am, 1 at 4pm, Disp: 360 tablet, Rfl: 2   ezetimibe (ZETIA) 10 MG tablet, Take 1 tablet (10 mg total) by  mouth daily., Disp: 90 tablet, Rfl: 3   indomethacin (INDOCIN) 50 MG capsule, Take 50 mg by mouth 3 (three) times daily as needed for moderate pain (gout pain)., Disp: , Rfl:    pramipexole (MIRAPEX) 0.5 MG tablet, Take 1 tablet (0.5 mg total) by mouth 3 (three) times daily., Disp: 270 tablet, Rfl: 2   rosuvastatin (CRESTOR) 20 MG tablet, Take 20 mg by mouth daily. , Disp: , Rfl: 4   vitamin B-12 (CYANOCOBALAMIN) 500 MCG tablet, Take 500 mcg by mouth daily., Disp: , Rfl:    Assessment     ICD-10-CM   1. Stenosis of right carotid artery  I65.21 EKG 12-Lead    ezetimibe (ZETIA) 10 MG tablet    PCV CAROTID DUPLEX (BILATERAL)    2. Supine hypertension  I10     3. Orthostatic hypotension  I95.1     4. Hypercholesteremia  E78.00 ezetimibe (ZETIA) 10 MG tablet      There are no discontinued medications. Orthostatic hypotension 08/12/22   Meds ordered this encounter  Medications   ezetimibe (ZETIA) 10 MG tablet    Sig: Take 1 tablet (10 mg total) by mouth daily.    Dispense:  90 tablet    Refill:  3   Orders Placed This Encounter  Procedures   EKG 12-Lead   Recommendations:   Cody Dickerson is a 72 y.o.   Fairly active Caucasian male patient with supine hypertension, orthostatic hypotension, Parkinson's disease, asymptomatic bilateral carotid artery stenosis, hypercholesterolemia who was last seen in our office 6 months ago  presents for follow-up of hypertension, carotid stenosis and hypercholesterolemia.  He remains asymptomatic.  1. Stenosis of right carotid artery Patient's carotid artery stenosis has remained stable, however his LDL is >70, although 72 years of age, would like to go ahead and start him on Zetia 10 mg daily.  I will recheck his carotid artery duplex in 6 months but I would like to see him back in a year.  He has remained stable from cardiac standpoint.  - EKG 12-Lead - ezetimibe (ZETIA) 10 MG tablet; Take 1 tablet (10 mg total) by mouth daily.  Dispense: 90 tablet; Refill: 3 - PCV CAROTID DUPLEX (BILATERAL); Future  2. Supine hypertension Advised him that his blood pressure should be recorded only when he is standing and only to treat the blood pressure standing.  At this point in view of Parkinson's disease, he has got orthostatic hypotension, blood pressure is well-controlled.  He is using support stockings on a as needed basis.  3. Orthostatic hypotension Related to Parkinson's disease.  Symptoms are mild, no pharmacologic therapy is indicated at this time.  He will continue to wear support stockings.  4. Hypercholesteremia As dictated above adding Zetia to his present medical regimen. - ezetimibe (ZETIA) 10 MG tablet; Take 1 tablet (10 mg total) by mouth daily.  Dispense: 90 tablet; Refill: 3   Yates Decamp, PA-C 08/12/2022, 12:46 PM Office: [phone]

## 2022-08-14 ENCOUNTER — Ambulatory Visit: Payer: Medicare Other | Admitting: Cardiology

## 2022-08-18 ENCOUNTER — Encounter: Payer: Self-pay | Admitting: Neurology

## 2022-08-18 ENCOUNTER — Ambulatory Visit (INDEPENDENT_AMBULATORY_CARE_PROVIDER_SITE_OTHER): Payer: Medicare Other | Admitting: Neurology

## 2022-08-18 VITALS — BP 112/66 | HR 70 | Ht 68.0 in | Wt 161.6 lb

## 2022-08-18 DIAGNOSIS — G903 Multi-system degeneration of the autonomic nervous system: Secondary | ICD-10-CM | POA: Diagnosis not present

## 2022-08-18 DIAGNOSIS — G20A1 Parkinson's disease without dyskinesia, without mention of fluctuations: Secondary | ICD-10-CM

## 2022-08-18 NOTE — Patient Instructions (Signed)
Use cane at all times.  Local and Online Resources for Power over Parkinson's Group  June 2024   LOCAL [place] PARKINSON'S GROUPS   Power over Parkinson's Group:    Power Over Parkinson's Patient Education Group will be Wednesday, JULY 10th-*PLEASE NOTE:  We will not be having a June meeting.  Our next meeting will be in July.  We apologize for the inconvenience.   Power over [employer] and Care Partner Groups will meet together, with plans for separate break out session for caregivers, depending on topic/speaker Upcoming Power over Parkinson's Meetings/Care Partner Support:  2nd Wednesdays of the month at 2 pm:   No regular June meeting, July 10th  Contact Amy Marriott at [email or web address] or Lynwood Dawley at [email or web address] if interested in participating in this group    LOCAL EVENTS AND NEW OFFERINGS  PLEASE NOTE:  There will be no June Power over [employer] meeting [facility] June 12th) Picnic Party for [employer].  [facility] Movement Disorders Program Celebration.  June 19th 1-3 pm.  Contact Lynwood Dawley at [email or web address] Parkinson's Social Game Night.  First Thursday of each month, 2:00-4:00 pm.  *Next date is June 6th*.  Rossie Muskrat [employer], [employer].  Contact [email or web address] if interested. Parkinson's CarePartner Group for Men is in the works, if interested email [email or web address] ACT FITNESS Chair Yoga classes "Train and Gain", Fridays 10 am, [employer].  Contact Gina at [phone].  Health visitor Classes offering at [employer]!  TUESDAYS (Chair Yoga)  and Wednesdays (PWR! Moves)  1:00 pm.   Contact Synetta Shadow at  [email or web address]  or [phone]  Antoine Poche! Moves classes.  Thursdays at 11:45, [facility].  Free to participate through [employer].  Contact Lynwood Dawley at  [email or web address] or [phone] to register Drumming for Parkinson's will be held on 2nd and 4th Mondays at 11:00 am.   Located at the [place] of the [employer] ([address] [place].)  Contact Albertina Parr at [email or web address] or [phone]  [facility] Parkinson's Tai Chi Class, Mondays at 11 am.  Call [phone] for details   [facility] EDUCATION AND SUPPORT  Parkinson Foundation:  www.parkinson.org  PD Health at Home continues:  Mindfulness Mondays, Wellness Wednesdays, Fitness Fridays  (PWR! Moves as part of Fitness Fridays March 22nd, 1-1:45 pm) Upcoming Education:   Current and Emerging Methods to Aid Parkinson's Diagnosis.  Wednesday, May 29th, 1-2 pm Recognize and Respond to Parkinson's Psychosis.  Wednesday, June 5th, 1-2 pm Parkinsonisms.  Wednesday, June 12th, 1-2 pm Expert Briefing:  Addressing the Challenge of Apathy in Parkinson's.  Wednesday, September 11th, 1-2 pm. Register for virtual education and Photographer (webinars) at [employer].gl Please check out their website to sign up for emails and see their full online offerings     [employer] Foundation:  www.[email or web address].org   Third Thursday Webinars:  On the third Thursday of every month at 12 p.m. ET, join our free live webinars to learn about various aspects of living with Parkinson's disease and our work to speed medical breakthroughs.  Upcoming Webinar:  You Want to [employer] for Parkinson's Research: Now What?  Thursday, June 20th at 12 noon. Check out additional information on their website to see their full online offerings    [facility]:  www.[email or web address].[email or web address]  Upcoming Webinar:  Living Safely with Parkinson's Inside and Outside of your Home.  Thursday, June 13th , 3 pm Series:  Living with Parkinson's Meetup.   Third  Thursdays each month, 3 pm  Care Partner Monthly Meetup.  With Jillene Bucks  [place].  First Tuesday of each month, 2 pm  Check out additional information to Live Well Today on their website    Parkinson and Movement Disorders (PMD) Alliance:  www.pmdalliance.org  [employer]:  Online Education Events  Sign up for emails, which are sent weekly to give you updates on programming and online offerings    Parkinson's Association of the [place]:  www.parkinsonassociation.org  Information on online support groups, education events, and online exercises including Yoga, Parkinson's exercises and more-LOTS of information on links to PD resources and online events  Virtual Support Group through Parkinson's Association of the [place]; next one is scheduled for Wednesday, June 5th   MOVEMENT AND EXERCISE OPPORTUNITIES  Parkinson's Exercise Class offerings at [employer]. *TUESDAYS* (Chair yoga) and Wednesdays (PWR! Moves)  1:00 pm.   *Please note that PWR! Moves [place] class has ended.  Please consider trying PWR! Moves on Wednesdays at 1 pm at [facility].  Contact Synetta Shadow at [email or web address]    Parkinson's Wellness Recovery (PWR! Moves)  www.pwr4life.org  Info on the PWR! Virtual Experience:  You will have access to our expertise?through self-assessment, guided plans that start with the PD-specific fundamentals, educational content, tips, Q&A with an expert, and a growing Engineering geologist of PD-specific pre-recorded and live exercise classes of varying types and intensity - both physical and cognitive! If that is not enough, we offer 1:1 wellness consultations (in-person or virtual) to personalize your PWR! Dance movement psychotherapist.   Parkinson [employer] Fridays:   As part of the PD Health @ Home program, this free video series focuses each week on one aspect of fitness designed to support people living with Parkinson's.? These weekly videos highlight the Parkinson Foundation fitness guidelines for people with Parkinson's disease.   MenusLocal.com.br  Dance for PD website is offering free, live-stream classes throughout the week, as well as links to [employer] of classes:  [email or web address]/  Virtual dance and Pilates for Parkinson's classes: Click on the Community Tab> Parkinson's Movement Initiative Tab.  To register for classes and for more information, visit www.[email or web address].co.za and click the "community" tab.   [employer] Parkinson's Cycling Classes   [facility]:  Thursdays @ Noon-Live classes at [employer] ([employer] at [email or web address]?or [phone])  [facility] [employer]: Classes Tuesday, Wednesday and Thursday (contact [place] at [email or web address] ?or [phone])  [facility] [employer]  Varied levels of classes are offered Tuesdays and Thursdays at [employer].   Stretching with Byrd Hesselbach weekly class is also offered for people with Parkinson's  To observe a class or for more information, call [phone] or email Patricia Nettle at [email or web address]   ADDITIONAL SUPPORT AND RESOURCES  Well[employer]Spring [employer]:  Online Caregiver Education Opportunities:  www.well-springsolutions.org/caregiver-education/caregiver-support-group.  You may also contact Loleta Chance at [facility] -spring.org or [phone].     Well-Spring Navigator:  Just1Navigator program, a?free service to help individuals and families through the journey of determining care for older adults.  The "Navigator" is a Child psychotherapist, Sidney Ace, who will speak with a prospective client and/or loved ones to provide an assessment of the situation and a set of recommendations for a personalized care plan -- all free of charge, and whether?Well-Spring [employer] offers the needed service or not. If the need is not a service we provide, we are well-connected with reputable programs in town that we can refer you to.   www.well-springsolutions.org or to speak with the Navigator, call [phone].

## 2022-09-03 ENCOUNTER — Ambulatory Visit (INDEPENDENT_AMBULATORY_CARE_PROVIDER_SITE_OTHER): Payer: Medicare Other | Admitting: Podiatry

## 2022-09-03 ENCOUNTER — Encounter: Payer: Self-pay | Admitting: Podiatry

## 2022-09-03 DIAGNOSIS — M79674 Pain in right toe(s): Secondary | ICD-10-CM | POA: Diagnosis not present

## 2022-09-03 DIAGNOSIS — B351 Tinea unguium: Secondary | ICD-10-CM

## 2022-09-03 DIAGNOSIS — M79675 Pain in left toe(s): Secondary | ICD-10-CM

## 2022-09-03 NOTE — Progress Notes (Signed)
This patient presents to the office for treatment of painful callus under the ball of his right foot.  He says this is painful walking and wearing his shoes.  He was referred to the office by his doctor since he has Parkinsons. He also requests his big toenails be trimmed. He presents to the office for evaluation and treatment.  Vascular  Dorsalis pedis and posterior tibial pulses are palpable  B/L.  Capillary return  WNL.  Temperature gradient is  WNL.  Skin turgor  WNL  Sensorium  Senn Weinstein monofilament wire  WNL. Normal tactile sensation.  Nail Exam  Patient has normal nails with no evidence of bacterial or fungal infection. Thick hallux nails  B/L.  Orthopedic  Exam  Muscle tone and muscle strength  WNL.  No limitations of motion feet  B/L.  No crepitus or joint effusion noted.  Foot type is unremarkable and digits show no abnormalities.  DJD 1st MPJ  right foot.  Skin  No open lesions.  Normal skin texture and turgor. Porokeratosis sub 1  right foot.  Porokeratosis right foot  Debride porokeratosis with # 15 blade and dremel tool. Debride nail nippers and dremel tool.   RTC 3 months.   Helane Gunther DPM

## 2022-09-10 ENCOUNTER — Telehealth: Payer: Self-pay | Admitting: Neurology

## 2022-09-10 NOTE — Telephone Encounter (Signed)
Pt left a VM stating that he wanted to know if Dr Tat would do a letter for him for jury duty. He has been called to jury duty

## 2022-09-11 NOTE — Telephone Encounter (Signed)
Called pateint to see why they will not accept a delay for 6 months until his 50 birthday when he can be removed from [place] all together

## 2022-09-11 NOTE — Telephone Encounter (Signed)
Pt is calling to see if he could get a letter to support the request of the provider so that he is able to turn it in with his jury duty letter.  Pt did not know the jury number to place on the letter we compose for him but would like for it to be place on it he will have when called back.  Pt would also like to have a call back to let him know when the letter is ready.

## 2022-09-11 NOTE — Telephone Encounter (Signed)
Called patient and let him know Dr. Iona Beard recommendations on jury duty

## 2022-09-12 ENCOUNTER — Telehealth: Payer: Self-pay | Admitting: Neurology

## 2022-09-12 NOTE — Telephone Encounter (Signed)
Patient called back this AM and he told me he was turned down for a delay with out a note from his doctor that  is treating him for his PD. Patient said there were other options for a delay but none related to him. He would like me to mail it to him

## 2022-09-12 NOTE — Telephone Encounter (Signed)
He had also mentioned his cognition as well that he wasn't able to serve due to his cognition and mobility due to parkinson's

## 2022-09-12 NOTE — Telephone Encounter (Signed)
Called patient and let him know that Dr. Arbutus Leas will not be writing the letter for him to get out of jury duty.

## 2022-09-12 NOTE — Telephone Encounter (Signed)
Patient is calling about a Cody Dickerson duty note/KB

## 2022-10-27 ENCOUNTER — Other Ambulatory Visit: Payer: Self-pay | Admitting: Neurology

## 2022-10-27 DIAGNOSIS — G20A1 Parkinson's disease without dyskinesia, without mention of fluctuations: Secondary | ICD-10-CM

## 2022-11-19 DIAGNOSIS — R634 Abnormal weight loss: Secondary | ICD-10-CM | POA: Diagnosis not present

## 2022-11-19 DIAGNOSIS — I10 Essential (primary) hypertension: Secondary | ICD-10-CM | POA: Diagnosis not present

## 2022-11-19 DIAGNOSIS — R2689 Other abnormalities of gait and mobility: Secondary | ICD-10-CM | POA: Diagnosis not present

## 2022-11-19 DIAGNOSIS — E7800 Pure hypercholesterolemia, unspecified: Secondary | ICD-10-CM | POA: Diagnosis not present

## 2022-11-19 DIAGNOSIS — Z23 Encounter for immunization: Secondary | ICD-10-CM | POA: Diagnosis not present

## 2022-11-20 ENCOUNTER — Other Ambulatory Visit: Payer: Self-pay | Admitting: Family Medicine

## 2022-11-20 DIAGNOSIS — R634 Abnormal weight loss: Secondary | ICD-10-CM

## 2022-11-27 DIAGNOSIS — Z1211 Encounter for screening for malignant neoplasm of colon: Secondary | ICD-10-CM | POA: Diagnosis not present

## 2022-11-27 DIAGNOSIS — Z1212 Encounter for screening for malignant neoplasm of rectum: Secondary | ICD-10-CM | POA: Diagnosis not present

## 2022-12-04 ENCOUNTER — Ambulatory Visit (INDEPENDENT_AMBULATORY_CARE_PROVIDER_SITE_OTHER): Payer: Medicare Other | Admitting: Podiatry

## 2022-12-04 ENCOUNTER — Encounter: Payer: Self-pay | Admitting: Podiatry

## 2022-12-04 DIAGNOSIS — B351 Tinea unguium: Secondary | ICD-10-CM

## 2022-12-04 DIAGNOSIS — M79675 Pain in left toe(s): Secondary | ICD-10-CM | POA: Diagnosis not present

## 2022-12-04 DIAGNOSIS — M79674 Pain in right toe(s): Secondary | ICD-10-CM

## 2022-12-04 NOTE — Progress Notes (Signed)
This patient presents to the office for treatment of painful callus under the ball of his right foot.  He says this is painful walking and wearing his shoes.  He was referred to the office by his doctor since he has Parkinsons. He also requests his big toenails be trimmed. He presents to the office for evaluation and treatment.  Vascular  Dorsalis pedis and posterior tibial pulses are palpable  B/L.  Capillary return  WNL.  Temperature gradient is  WNL.  Skin turgor  WNL  Sensorium  Senn Weinstein monofilament wire  WNL. Normal tactile sensation.  Nail Exam  Patient has thick disfigured big toenails B/L.  Thick hallux nails  B/L.  Orthopedic  Exam  Muscle tone and muscle strength  WNL.  No limitations of motion feet  B/L.  No crepitus or joint effusion noted.  Foot type is unremarkable and digits show no abnormalities.  DJD 1st MPJ  right foot.  Skin  No open lesions.  Normal skin texture and turgor.  Onychomycosis  B/L   Debride  nails with nail nippers and dremel tool.   RTC 3 months.   Helane Gunther DPM

## 2022-12-05 ENCOUNTER — Ambulatory Visit
Admission: RE | Admit: 2022-12-05 | Discharge: 2022-12-05 | Disposition: A | Discharge status: Home / Self Care | Payer: Medicare Other | Source: Ambulatory Visit | Attending: Family Medicine | Admitting: Family Medicine

## 2022-12-05 DIAGNOSIS — R634 Abnormal weight loss: Secondary | ICD-10-CM

## 2022-12-05 DIAGNOSIS — K561 Intussusception: Secondary | ICD-10-CM | POA: Diagnosis not present

## 2022-12-05 DIAGNOSIS — I700 Atherosclerosis of aorta: Secondary | ICD-10-CM | POA: Diagnosis not present

## 2022-12-05 DIAGNOSIS — K449 Diaphragmatic hernia without obstruction or gangrene: Secondary | ICD-10-CM | POA: Diagnosis not present

## 2022-12-05 MED ORDER — IOPAMIDOL (ISOVUE-300) INJECTION 61%
500.0000 mL | Freq: Once | INTRAVENOUS | Status: AC | PRN
  Administered 2022-12-05: 100 mL via INTRAVENOUS

## 2022-12-11 ENCOUNTER — Telehealth: Payer: Self-pay | Admitting: Physical Therapy

## 2022-12-11 ENCOUNTER — Ambulatory Visit: Payer: Medicare Other

## 2022-12-11 ENCOUNTER — Ambulatory Visit: Payer: Medicare Other | Admitting: Occupational Therapy

## 2022-12-11 ENCOUNTER — Ambulatory Visit: Payer: Medicare Other | Attending: Family Medicine | Admitting: Physical Therapy

## 2022-12-11 DIAGNOSIS — R2681 Unsteadiness on feet: Secondary | ICD-10-CM

## 2022-12-11 DIAGNOSIS — R29898 Other symptoms and signs involving the musculoskeletal system: Secondary | ICD-10-CM | POA: Insufficient documentation

## 2022-12-11 DIAGNOSIS — R471 Dysarthria and anarthria: Secondary | ICD-10-CM | POA: Insufficient documentation

## 2022-12-11 DIAGNOSIS — R278 Other lack of coordination: Secondary | ICD-10-CM

## 2022-12-11 DIAGNOSIS — R293 Abnormal posture: Secondary | ICD-10-CM | POA: Insufficient documentation

## 2022-12-11 DIAGNOSIS — R29818 Other symptoms and signs involving the nervous system: Secondary | ICD-10-CM | POA: Insufficient documentation

## 2022-12-11 DIAGNOSIS — R2689 Other abnormalities of gait and mobility: Secondary | ICD-10-CM

## 2022-12-11 DIAGNOSIS — M6281 Muscle weakness (generalized): Secondary | ICD-10-CM | POA: Insufficient documentation

## 2022-12-11 DIAGNOSIS — R41841 Cognitive communication deficit: Secondary | ICD-10-CM | POA: Insufficient documentation

## 2022-12-11 DIAGNOSIS — R251 Tremor, unspecified: Secondary | ICD-10-CM

## 2022-12-11 NOTE — Telephone Encounter (Signed)
Hello, Cody Dickerson was seen for PT, OT, speech therapy screens in our multi-disciplinary clinic.  Occupational therapy is recommended for follow up due to slowing and increased difficulty with coordination tasks impacting ADLs.  Physical therapy is recommended for follow up due to slowed mobility deficits compared to previous bout of therapy and pt's reports of several falls.  If you agree, please send referral via Epic for PT and OT eval and treat.  Lonia Blood, PT 12/11/22 1:31 PM Phone: [phone] Fax: [phone]  [facility] [address] [place], [address] [place], [place] [address] Phone # [phone] Fax # [phone]

## 2022-12-11 NOTE — Therapy (Signed)
[place] [facility] [address], [address] [place], [place], [address] Phone: [phone]   Fax:  [phone]  Patient Details  Name: Cody Dickerson MRN: [id number] Date of Birth: 16-Jan-1951 Referring Provider:  Kerin Salen, DO  Encounter Date: 12/11/2022 Speech Therapy Parkinson's Disease Screen   Decibel Level today: low 70s dB  (WNL=70-72 dB) with sound level meter 30cm away from pt's mouth. Pt's conversational volume has remained the same since last treatment course.  Pt does not not report difficulty with swallowing, which does not warrant further evaluation  Pt does does not require speech therapy services at this time. Recommend ST screen in another 6-8 months   Dajahnae Vondra, CCC-SLP 12/11/2022, 1:44 PM  [place] [facility] [address], [address] [place], [place], [address] Phone: [phone]   Fax:  [phone]

## 2022-12-11 NOTE — Therapy (Signed)
[place] [facility] [address] [place], [place], [address] Phone: [phone]   Fax:  [phone]  Patient Details  Name: Cody Dickerson MRN: [id number] Date of Birth: December 04, 1950 Referring Provider:  Daisy Floro, MD  Encounter Date: 12/11/2022  Occupational Therapy Parkinson's Disease Screen  Hand dominance:  Right   Handwriting: pt reports that it has gone downhill tremendously.  He is still writing checks, but attempts to minimize his writing.  PPT#1 (Whales live in a blue ocean): 23.0 sec Mild micrographia and 75% legible.  Fastening/unfastening 3 buttons in:  56.15 sec  Box & Blocks Test:   RUE  29 blocks        LUE  34 blocks  Standing Functional Reach Test:   RUE  6 inches        LUE  7.5 inches  Change in ability to perform ADLs/IADLs:  reports buttons are more challenging and overall slowing with ADLs  Pt would benefit from occupational therapy evaluation due to  slowing and increased difficulty with coordination tasks impacting ADLs     Esa Raden, OTR/L 12/11/2022, 11:54 AM  [place]  [facility] [address] [place], [place], [address] Phone: [phone]   Fax:  [phone]

## 2022-12-11 NOTE — Therapy (Signed)
[facility] [address] [place], [place], [address] Phone: [phone]   Fax:  [phone]  Patient Details  Name: Cody Dickerson MRN: [id number] Date of Birth: September 23, 1950 Referring Provider:  Daisy Floro, MD  Encounter Date: 12/11/2022  Physical Therapy Parkinson's Disease Screen   Timed Up and Go test: 12.84 sec (compared to 9.66 sec)  10 meter walk test: 3.26 ft/sec (compared to 4.40 ft/sec)  5 time sit to stand test:17.81 sec with posterior bias (compared to 16.97 sec)  Patient would benefit from Physical Therapy evaluation due to slowed mobility measures and reports of falls and balance issues.   "Balance is bad".  Have had several falls in the yard.  Do the spin class and have recently started the Tai Chi class.   Brendia Dampier W., PT 12/11/2022, 12:22 PM  [facility] [address] [place], [place], [address] Phone: [phone]   Fax:  [phone]

## 2022-12-15 ENCOUNTER — Other Ambulatory Visit: Payer: Self-pay

## 2022-12-15 NOTE — Telephone Encounter (Signed)
Orders for OT and PT sent to [facility] at [facility]

## 2022-12-18 ENCOUNTER — Encounter: Payer: Self-pay | Admitting: Gastroenterology

## 2022-12-18 DIAGNOSIS — R935 Abnormal findings on diagnostic imaging of other abdominal regions, including retroperitoneum: Secondary | ICD-10-CM | POA: Diagnosis not present

## 2022-12-18 DIAGNOSIS — R634 Abnormal weight loss: Secondary | ICD-10-CM

## 2022-12-18 DIAGNOSIS — Z23 Encounter for immunization: Secondary | ICD-10-CM | POA: Diagnosis not present

## 2022-12-18 DIAGNOSIS — G20C Parkinsonism, unspecified: Secondary | ICD-10-CM | POA: Diagnosis not present

## 2022-12-19 ENCOUNTER — Other Ambulatory Visit: Payer: Self-pay | Admitting: Gastroenterology

## 2022-12-19 ENCOUNTER — Encounter: Payer: Self-pay | Admitting: Gastroenterology

## 2022-12-19 DIAGNOSIS — R634 Abnormal weight loss: Secondary | ICD-10-CM

## 2022-12-19 DIAGNOSIS — R935 Abnormal findings on diagnostic imaging of other abdominal regions, including retroperitoneum: Secondary | ICD-10-CM

## 2022-12-19 DIAGNOSIS — K639 Disease of intestine, unspecified: Secondary | ICD-10-CM

## 2022-12-24 ENCOUNTER — Ambulatory Visit: Payer: Medicare Other | Admitting: Occupational Therapy

## 2022-12-24 ENCOUNTER — Other Ambulatory Visit: Payer: Self-pay

## 2022-12-24 ENCOUNTER — Ambulatory Visit: Payer: Medicare Other | Admitting: Physical Therapy

## 2022-12-24 DIAGNOSIS — M6281 Muscle weakness (generalized): Secondary | ICD-10-CM | POA: Diagnosis not present

## 2022-12-24 DIAGNOSIS — R471 Dysarthria and anarthria: Secondary | ICD-10-CM | POA: Diagnosis not present

## 2022-12-24 DIAGNOSIS — R2689 Other abnormalities of gait and mobility: Secondary | ICD-10-CM | POA: Diagnosis not present

## 2022-12-24 DIAGNOSIS — R29898 Other symptoms and signs involving the musculoskeletal system: Secondary | ICD-10-CM | POA: Diagnosis not present

## 2022-12-24 DIAGNOSIS — R2681 Unsteadiness on feet: Secondary | ICD-10-CM | POA: Diagnosis not present

## 2022-12-24 DIAGNOSIS — R278 Other lack of coordination: Secondary | ICD-10-CM

## 2022-12-24 DIAGNOSIS — R29818 Other symptoms and signs involving the nervous system: Secondary | ICD-10-CM

## 2022-12-24 DIAGNOSIS — R41841 Cognitive communication deficit: Secondary | ICD-10-CM | POA: Diagnosis not present

## 2022-12-24 DIAGNOSIS — R293 Abnormal posture: Secondary | ICD-10-CM | POA: Diagnosis not present

## 2022-12-24 NOTE — Therapy (Signed)
OUTPATIENT OCCUPATIONAL THERAPY PARKINSON'S EVALUATION  Patient Name: Cody Dickerson MRN: [id number] DOB:August 29, 1950, 72 y.o., male Today's Date: 12/24/2022  PCP: Daisy Floro, MD  REFERRING PROVIDER: Tat, Octaviano Batty, DO   END OF SESSION:  OT End of Session - 12/24/22 1406     Visit Number 1    Number of Visits 12    Date for OT Re-Evaluation 02/20/23    Authorization Type Medicare A&B    Progress Note Due on Visit 10    OT Start Time 1445    OT Stop Time 1530    OT Time Calculation (min) 45 min    Activity Tolerance Patient tolerated treatment well    Behavior During Therapy WFL for tasks assessed/performed             Past Medical History:  Diagnosis Date   Balance problem    Gout    Hypercholesterolemia    Hypertension    Parkinson's disease (HCC)    Small bowel obstruction (HCC)    Past Surgical History:  Procedure Laterality Date   BOWEL BLOCKAGE  05/2019   IR ANGIO INTRA EXTRACRAN SEL COM CAROTID INNOMINATE BILAT MOD SED  10/03/2021   IR ANGIO VERTEBRAL SEL VERTEBRAL UNI R MOD SED  10/03/2021   IR RADIOLOGIST EVAL & MGMT  08/19/2021   IR US GUIDE VASC ACCESS RIGHT  10/04/2021   Patient Active Problem List   Diagnosis Date Noted   Pain due to onychomycosis of toenails of both feet 09/03/2022   Porokeratosis 06/04/2022   Parkinson's disease (HCC) 02/06/2021   SBO (small bowel obstruction) (HCC) 07/06/2019    ONSET DATE: 12/15/22 (referral date),  Pt reports Parkinson's dx approx. 3 years ago (approx. 2021)  REFERRING DIAG:   R29.3 (ICD-10-CM) - Abnormal posture  R27.8 (ICD-10-CM) - Other lack of coordination  R25.1 (ICD-10-CM) - Tremor  M62.81 (ICD-10-CM) - Muscle weakness (generalized)  R29.898 (ICD-10-CM) - Other symptoms and signs involving the musculoskeletal system  R26.81 (ICD-10-CM) - Unsteadiness on feet  R26.89 (ICD-10-CM) - Other abnormalities of gait and mobility    THERAPY DIAG:  Other lack of coordination  Muscle weakness  (generalized)  Unsteadiness on feet  Other symptoms and signs involving the musculoskeletal system  Rationale for Evaluation and Treatment: Rehabilitation  SUBJECTIVE:   SUBJECTIVE STATEMENT: Pt reports "buttons are tougher than they used to be." Pt reports "doing ok" overall. Pt reports sporadic tremors in the morning and particularly after carrying heavy items, but "they go away."  Pt accompanied by: self  PERTINENT HISTORY: 08/12/22 MD Progress Notes and 08/18/22 DO Progress Notes: Parkinson's disease, left distal supraclinoid ICA aneurysm (aneurysm size stable), MCI, HTN, gout, hypercholesterolemia  PRECAUTIONS: Fall, posterior lean when standing/walking   WEIGHT BEARING RESTRICTIONS: No  PAIN:  Are you having pain? No  FALLS: Has patient fallen in last 6 months? Yes. Number of falls Pt reports approx. 4 falls. Pt reports picking up sticks in yard when 2 falls occurred.  Per 12/11/22 PT Screen: Pt reports "Balance is bad". Have had several falls in the yard.   LIVING ENVIRONMENT: Lives with: lives with their spouse Lives in: House/apartment Stairs: Yes: External: 3 steps; on left going up Has following equipment at home: Grab bars  PLOF: Independent with basic ADLs, pt currently drives  PATIENT GOALS: "to do learn how to do things better."  OBJECTIVE:  Note: Objective measures were completed at Evaluation unless otherwise noted.  HAND DOMINANCE: Right  ADLs: Overall ADLs:  Transfers/ambulation related to  ADLs: Eating: ind, pt reports slower and more meticulous to get items on fork. No difficulty with cutting and serving food. Grooming: Pt reports shaving with standard razor blade is slower. Pt has electric shaver available though pt has not attempted to use it. UB Dressing: Pt reports "Not as good putting jacket on as I used to be." Pt currently using cape method to don jacket, a strategy learned in previous OT sessions. Increased difficulty with buttons: "I have more  trouble with buttoning buttons placed in front of me than when a shirt is on me." Pt reports taking extra time to work out zippers if zippers get stuck. LB Dressing: "Getting my pant legs on and off are a little slower but it's acceptable." Toileting: Ind Bathing: Ind with someone else in the home for safety Tub Shower transfers: ind Equipment: Grab bars, Sports administrator, and tub/shower  IADLs: Shopping: spouse completes grocery shopping, pt helps out with carrying heavy water Light housekeeping: pt folds laundry without difficulty Meal Prep: Ind with cutting, pt completes charcoal grilling tasks and pt reports being careful and walking slow around grill for safety. 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 Handwriting: Pt reports handwriting is slower than it used to be, and even slower if he focuses on writing legibly.   Pt wrote "Whales live in [place] ocean" in 18 seconds. Increased time required and approx. 66% legible.  MOBILITY STATUS: Ind without A/E. PT reported to OT that pt demo's significant posterior lean when standing and walking. Pt reports "imbalance tends to go backwards."  POSTURE COMMENTS:  rounded shoulders and forward head  ACTIVITY TOLERANCE: Activity tolerance: Pt reports increased fatigue and sleepiness during the day. Pt reports sleeping well at night. Pt reports increased fatigue may be d/t medication though pt is unsure.  FUNCTIONAL OUTCOME MEASURES: Fastening/unfastening 3 buttons: 98 seconds. OT noted mild tremors of BUE when completing task.  Physical performance test: PPT#2 (simulated eating) 17 seconds. OT noted mild tremors of RUE when completing task.  PPT#4 (donning/doffing jacket): 18 seconds. With gait belt. OT noted mild tremors of BUE during task.  COORDINATION: 9 Hole Peg test: Right: 36 sec; Left: 36 sec Box and  Blocks:  Right 34 blocks, Left 41 blocks  UE ROM:    BUE shoulders flex/ABD - WFL.  BUE elbow flex/ext - WFL with v/c to fully ext. R elbow ext slower than L elbow ext.  Wrist flex/ext - WFL, v/c to fully ext.  Digits flex/ext - WFL, v/c to fully ext thumbs with slightly decreased AROM of B thumbs noted.  Note: Pt demo'd decreased speed to attain full AROM of BUE.   UE MMT:   Not tested  SENSATION: WFL Pt reports no change.  COGNITION: Overall cognitive status: Within functional limits for tasks assessed. Pt reports more difficulty remembering day of the week, taking medication at 4 PM specifically. Pt reports using calendar to help with memory.   OBSERVATIONS: Pt was pleasant and agreeable. Pt recognized difficulty with ADL/IADL tasks and described current level of functioning well. OT noted Bradykinesia and mild tremors with FM tasks . Pt donned reading glasses for handwriting task. Pt had hematoma on LUE, dorsal aspect of L hand. When asked about hematoma, pt reports "I don't know [how I got it], maybe on screen door, but I bruise easily."  TODAY'S TREATMENT:  DATE:   Pt education, see below.   PATIENT EDUCATION: Education details: OT role, POC, safety, brief discussion of timers on phone, calendar options on paper and phone Person educated: Patient Education method: Explanation Education comprehension: verbalized understanding  HOME EXERCISE PROGRAM: N/A  GOALS: Goals reviewed with patient? Yes  GOALS:  SHORT TERM GOALS: Target date: 01/23/23    Pt will be independent with PD specific HEP.  Baseline: not yet initiated Goal status: INITIAL  2.  Pt will verbalize understanding of adapted strategies to maximize safety and independence with ADLs/IADLs.  Baseline: not yet initiated Goal status: INITIAL  3.  Pt will write a simple  sentence with at least 75% legibility and complete the sentence in 15 seconds or less.   Baseline: 66% legibility, 18 seconds for simple sentence Goal status: INITIAL  4.  Pt will demonstrate improved fine motor coordination for ADLs as evidenced by completing 9 hole peg test score for BUE in 30 seconds or less.  Baseline: 9 Hole Peg test: Right: 36 sec; Left: 36 sec Goal status: INITIAL   5.  Pt will be able to place at least 45 blocks with BUE hand with completion of Box and Blocks test.  Baseline: Right 34 blocks, Left 41 blocks Goal status: INITIAL    LONG TERM GOALS: Target date: 02/20/23    Pt will verbalize understanding of ways to prevent future PD related complications and PD community resources.  Baseline: not yet initiated Goal status: INITIAL  2.  Pt will write a simulated/sample check with at least 85% legibility while maintaining steady speed.  Baseline: 66% legibility for simple sentence, 18 seconds for simple sentence  Goal status: INITIAL  5.  Pt will demonstrate improved ease with fastening buttons as evidenced by decreasing 3 button/unbutton time to 80 seconds or less.  Baseline: 98 seconds Goal status: INITIAL  3.  Pt will verbalize understanding of ways to keep thinking skills sharp and ways to compensate for STM changes in the future.  Baseline: not yet initiated Goal status: INITIAL  4.  Pt will demonstrate improved ease with feeding as evidenced by decreasing PPT#2 to 15 seconds or less.  Baseline: PPT #2: 17 secs, pt reports difficulty placing food on fork Goal status: INITIAL  5.  Pt will demonstrate increased ease with dressing as evidenced by decreasing PPT#4 (don/ doff jacket) to 15 secs or less.  Baseline: PPT #4: 18 secs Goal status: INITIAL    ASSESSMENT:  CLINICAL IMPRESSION: Patient is a 72 y.o. male who was seen today for occupational therapy evaluation for abnormal posture, other lack of coordination, tremor, muscle weakness,  other signs/symptoms involving the nervous system, unsteadiness on feet, and other abnormalities of gait/mobility. Hx includes Parkinson's disease, left distal supraclinoid ICA aneurysm (aneurysm size stable), MCI, HTN, gout, hypercholesterolemia. Patient currently presents below baseline level of functioning demonstrating functional deficits and impairments as noted below. Pt would benefit from skilled OT services in the outpatient setting to work on impairments as noted below to help pt return to PLOF as able.     PERFORMANCE DEFICITS: in functional skills including ADLs, IADLs, coordination, dexterity, proprioception, ROM, strength, Fine motor control, Gross motor control, mobility, balance, body mechanics, endurance, and UE functional use, cognitive skills including energy/drive and memory, and psychosocial skills including environmental adaptation.   IMPAIRMENTS: are limiting patient from ADLs, IADLs, leisure, and social participation.   COMORBIDITIES:  may have co-morbidities  that affects occupational performance. Patient will benefit from skilled OT to address  above impairments and improve overall function.  MODIFICATION OR ASSISTANCE TO COMPLETE EVALUATION: Min-Moderate modification of tasks or assist with assess necessary to complete an evaluation.  OT OCCUPATIONAL PROFILE AND HISTORY: Problem focused assessment: Including review of records relating to presenting problem.  CLINICAL DECISION MAKING: LOW - limited treatment options, no task modification necessary  REHAB POTENTIAL: Good  EVALUATION COMPLEXITY: Low    PLAN:  OT FREQUENCY: 2x/week  OT DURATION: 6 weeks (dates extended to allow for scheduling)  PLANNED INTERVENTIONS: 97168 OT Re-evaluation, 97535 self care/ADL training, 43329 therapeutic exercise, 97530 therapeutic activity, 97112 neuromuscular re-education, 97140 manual therapy, 97035 ultrasound, 97018 paraffin, 51884 fluidotherapy, 97010 moist heat, 97010 cryotherapy,  97032 electrical stimulation (manual), 97014 electrical stimulation unattended, 97760 Orthotics management and training, 16606 Splinting (initial encounter), [id number] Subsequent splinting/medication, passive range of motion, functional mobility training, energy conservation, patient/family education, and DME and/or AE instructions  RECOMMENDED OTHER SERVICES: PT eval already completed on 12/24/22.  CONSULTED AND AGREED WITH PLAN OF CARE: Patient  PLAN FOR NEXT SESSION:   Show pt how to set timers on phone to increase ind with medication management (e.g. timer to remind pt to take medication at 4 PM, per pt report)  If pt interested, show pt phone calendar functions if pt wishes to transition from paper calendar  BIG movements  FM tasks - including buttoning buttons  Donning/doffing jacket - review strategies (e.g. "cape method")   Wynetta Emery, OT 12/24/2022, 4:12 PM

## 2022-12-24 NOTE — Therapy (Signed)
OUTPATIENT PHYSICAL THERAPY NEURO EVALUATION   Patient Name: Cody Dickerson MRN: [id number] DOB:05-14-1950, 72 y.o., male Today's Date: 12/25/2022   PCP: Daisy Floro, MD REFERRING PROVIDER: Tat, Octaviano Batty, DO   END OF SESSION:  PT End of Session - 12/24/22 1405     Visit Number 1    Number of Visits 17    Date for PT Re-Evaluation 02/20/23    Authorization Type Medicare/[employer]ARP    Progress Note Due on Visit 10    PT Start Time 1402    PT Stop Time 1445    PT Time Calculation (min) 43 min    Activity Tolerance Patient tolerated treatment well    Behavior During Therapy WFL for tasks assessed/performed             Past Medical History:  Diagnosis Date   Balance problem    Gout    Hypercholesterolemia    Hypertension    Parkinson's disease (HCC)    Small bowel obstruction (HCC)    Past Surgical History:  Procedure Laterality Date   BOWEL BLOCKAGE  05/2019   IR ANGIO INTRA EXTRACRAN SEL COM CAROTID INNOMINATE BILAT MOD SED  10/03/2021   IR ANGIO VERTEBRAL SEL VERTEBRAL UNI R MOD SED  10/03/2021   IR RADIOLOGIST EVAL & MGMT  08/19/2021   IR US GUIDE VASC ACCESS RIGHT  10/04/2021   Patient Active Problem List   Diagnosis Date Noted   Pain due to onychomycosis of toenails of both feet 09/03/2022   Porokeratosis 06/04/2022   Parkinson's disease (HCC) 02/06/2021   SBO (small bowel obstruction) (HCC) 07/06/2019    ONSET DATE: 12/15/2022 (MD referral)  REFERRING DIAG:  R29.3 (ICD-10-CM) - Abnormal posture  R27.8 (ICD-10-CM) - Other lack of coordination  R25.1 (ICD-10-CM) - Tremor  M62.81 (ICD-10-CM) - Muscle weakness (generalized)  R29.898 (ICD-10-CM) - Other symptoms and signs involving the musculoskeletal system  R26.81 (ICD-10-CM) - Unsteadiness on feet  R26.89 (ICD-10-CM) - Other abnormalities of gait and mobility    THERAPY DIAG:  Abnormal posture  Unsteadiness on feet  Other abnormalities of gait and mobility  Other symptoms and signs involving the  nervous system  Muscle weakness (generalized)  Rationale for Evaluation and Treatment: Rehabilitation  SUBJECTIVE:                                                                                                                                                                                             SUBJECTIVE STATEMENT: Balance is not good.  Have had several falls in the yard.  Seem to be "tipsy" when I stand up or when I walk.  I do tend to fall backwards.  Fell in the yard, most recently picking up sticks in the yard.   Pt accompanied by: self  PERTINENT HISTORY: Left distal supraclinoid ICA aneurysm, MCI  PAIN:  Are you having pain? No  PRECAUTIONS: Fall  RED FLAGS: None   WEIGHT BEARING RESTRICTIONS: No  FALLS: Has patient fallen in last 6 months? Yes. Number of falls 3-4 -Able to get up on his own from falls, though slowly per report.    LIVING ENVIRONMENT: Lives with: lives with their spouse; pt has to help her at times Lives in: House/apartment Stairs: Yes: External: 3 steps; on right going up Has following equipment at home: None  PLOF: Independent; Spin class 1x/wk, has tried [employer]; walks several times per week; enjoys yard work  PATIENT GOALS: Pt's goals for therapy to get up better from chairs, have better balance.  OBJECTIVE:  Note: Objective measures were completed at Evaluation unless otherwise noted.  DIAGNOSTIC FINDINGS: NA per this episode  COGNITION: Overall cognitive status:  hx of MCI per MD note; WFL for session today   SENSATION: WFL  COORDINATION: Slowed RAM with toe tapping  EDEMA:  1+pitting edema BLEs MD is aware-has recommended compression stocking  MUSCLE TONE: Mild increase LLE tone  POSTURE: rounded shoulders, forward head, and posterior pelvic tilt  LOWER EXTREMITY ROM:     Active  Right Eval Left Eval  Hip flexion    Hip extension    Hip abduction    Hip adduction    Hip internal rotation    Hip external  rotation    Knee flexion    Knee extension    Ankle dorsiflexion 10 8  Ankle plantarflexion    Ankle inversion    Ankle eversion     (Blank rows = not tested)    TRANSFERS: Assistive device utilized: None  Sit to stand: CGA and without use of BUEs, he has posterior lean, not as prominent with use of BUEs Stand to sit: CGA   GAIT: Gait pattern: step through pattern, decreased step length- Right, decreased step length- Left, shuffling, trunk flexed, and narrow BOS Distance walked: 50 ft x 2 Assistive device utilized: None Level of assistance: SBA  FUNCTIONAL TESTS:  5 times sit to stand: 18 sec with 3 of 5 episodes of posterior lean Timed up and go (TUG): 12.41 sec 10 meter walk test: 10 sec  MiniBESTest:  14/28 TUG cognitive:  23.25 esc TUG manual:  12.94 sec 360 turn R:  13 steps, 4.66 sec 360 turn L:  12 steps, 4.44 sec 70M back:  7.34 sec, small, quick, narrow BOS steps   [facility] PT Assessment - 12/25/22 0001       Standardized Balance Assessment   Standardized Balance Assessment Mini-BESTest      Mini-BESTest   Sit To Stand Moderate: Comes to stand WITH use of hands on first attempt.    Rise to Toes < 3 s.   2.1 sec   Stand on one leg (left) Moderate: < 20 s   4.1 sec, 1.41 sec   Stand on one leg (right) Moderate: < 20 s   1.41 sec, 1.66 sec   Stand on one leg - lowest score 1    Compensatory Stepping Correction - Forward No step, OR would fall if not caught, OR falls spontaneously.    Compensatory Stepping Correction - Backward No step, OR would fall if not caught, OR falls spontaneously.   multiple small steps  Compensatory Stepping Correction - Left Lateral Severe: Falls, or cannot step    Compensatory Stepping Correction - Right Lateral Severe:  Falls, or cannot step    Stepping Corredtion Lateral - lowest score 0    Stance - Feet together, eyes open, firm surface  Normal: 30s    Stance - Feet together, eyes closed, foam surface  Moderate: < 30s   4.78 sec,  posterior lean   Incline - Eyes Closed Moderate: Stands independently < 30s OR aligns with surface   4.8 sec posterior lean   Change in Gait Speed Normal: Significantly changes walkling speed without imbalance    Walk with head turns - Horizontal Moderate: performs head turns with reduction in gait speed.    Walk with pivot turns Normal: Turns with feet close FAST (< 3 steps) with good balance.    Step over obstacles Normal: Able to step over box with minimal change of gait speed and with good balance.    Timed UP & GO with Dual Task Moderate: Dual Task affects either counting OR walking (>10%) when compared to the TUG without Dual Task.    Mini-BEST total score 14              TODAY'S TREATMENT:                                                                                                                              DATE: 12/24/2022    PATIENT EDUCATION: Education details: Eval results, POC Person educated: Patient Education method: Explanation Education comprehension: verbalized understanding  HOME EXERCISE PROGRAM: Not yet initiated  GOALS: Goals reviewed with patient? Yes  SHORT TERM GOALS: Target date: 01/23/2023  Pt will be independent with HEP for improved balance, strength, gait. Baseline: Goal status: INITIAL  2.  Pt will improve 5x sit<>stand to less than or equal to 15 sec with no posterior lean to demonstrate improved functional strength and transfer efficiency. Baseline: 18 sec, 3 of 5 reps posterior lean Goal status: INITIAL  3.  Pt will improve TUG cognitive score to less than or equal to 18 sec for decreased fall risk. Baseline: 23.45 sec Goal status: INITIAL   LONG TERM GOALS: Target date: 02/20/2023  Pt will be independent with progression of HEP for improved balance, strength, gait. Baseline:  Goal status: INITIAL  2.  Pt will improve 5x sit<>stand to less than or equal to 12.5 sec and no posterior lean to demonstrate improved functional  strength and transfer efficiency. Baseline:  Goal status: INITIAL  3.  Pt will improve MiniBESTest score to at least 19/28 to decrease fall risk. Baseline: 14/24 Goal status: INITIAL  4.  Pt will perform 360 turn to R and L in 8 steps or less for improved balance. Baseline: 12-13 steps Goal status: INITIAL  5.  Pt will perform posterior push and release test in 2 steps or less, for improved posterior balance recovery. Baseline: multiple small steps, would fall  if unaided Goal status: INITIAL  6.  Pt will verbalize understanding of local Parkinson's disease community resources, including optimal fitness program. Baseline:  Goal status: INITIAL  ASSESSMENT:  CLINICAL IMPRESSION: Patient is a 72 y.o. male who was seen today for physical therapy evaluation and treatment for Parkinson's disease.  Pt was seen for PD screen several weeks prior and PT was recommended based on slowed mobility measures and pt's reports of decreased balance as well as recent hx of falls.  He presents today with decreased functional strength, decreased balance, decreased timing and coordination of gait, decreased ability for dual tasking with gait, festinating gait with turns and changes of direction, postural instability, abnormal posture.  He is at increased fall risk per FTSTS, TUG cognitive test, and MiniBESTest.  He has had 3-4 falls in past 6 months.  He would benefit from skilled PT to address the above stated deficits for improved balance and overall functional mobility as well as decreased fall risk.   OBJECTIVE IMPAIRMENTS: Abnormal gait, decreased balance, decreased knowledge of use of DME, decreased mobility, difficulty walking, decreased ROM, decreased strength, impaired flexibility, and postural dysfunction.   ACTIVITY LIMITATIONS: standing, squatting, transfers, and locomotion level  PARTICIPATION LIMITATIONS: community activity and yard work  PERSONAL FACTORS: 3+ comorbidities: see above  are also  affecting patient's functional outcome.   REHAB POTENTIAL: Good  CLINICAL DECISION MAKING: Evolving/moderate complexity  EVALUATION COMPLEXITY: Moderate  PLAN:  PT FREQUENCY: 2x/week  PT DURATION: 8 weeks plus eval  PLANNED INTERVENTIONS: 97110-Therapeutic exercises, 97530- Therapeutic activity, 97112- Neuromuscular re-education, 97535- Self Care, [address]- Manual therapy, [id number]- Gait training, Patient/Family education, Balance training, and DME instructions  PLAN FOR NEXT SESSION: Initiate HEP-to address balance, sit to stand, squats, balance strategies, compliant surface work; exercises to counteract posterior lean/LOB   Janziel Hockett W., PT 12/25/2022, 12:53 PM  [facility] [address] [place], [place] [address] Phone # [phone] Fax # [phone]

## 2023-01-01 ENCOUNTER — Other Ambulatory Visit: Payer: Medicare Other

## 2023-01-01 ENCOUNTER — Ambulatory Visit
Admission: RE | Admit: 2023-01-01 | Discharge: 2023-01-01 | Disposition: A | Discharge status: Home / Self Care | Payer: Medicare Other | Source: Ambulatory Visit | Attending: Gastroenterology | Admitting: Gastroenterology

## 2023-01-01 DIAGNOSIS — K5730 Diverticulosis of large intestine without perforation or abscess without bleeding: Secondary | ICD-10-CM | POA: Diagnosis not present

## 2023-01-01 DIAGNOSIS — R911 Solitary pulmonary nodule: Secondary | ICD-10-CM | POA: Diagnosis not present

## 2023-01-01 DIAGNOSIS — R634 Abnormal weight loss: Secondary | ICD-10-CM

## 2023-01-01 DIAGNOSIS — R935 Abnormal findings on diagnostic imaging of other abdominal regions, including retroperitoneum: Secondary | ICD-10-CM

## 2023-01-01 DIAGNOSIS — K639 Disease of intestine, unspecified: Secondary | ICD-10-CM

## 2023-01-01 MED ORDER — IOPAMIDOL (ISOVUE-300) INJECTION 61%
500.0000 mL | Freq: Once | INTRAVENOUS | Status: AC | PRN
  Administered 2023-01-01: 100 mL via INTRAVENOUS

## 2023-01-06 ENCOUNTER — Ambulatory Visit: Payer: Medicare Other | Admitting: Occupational Therapy

## 2023-01-06 ENCOUNTER — Ambulatory Visit: Payer: Medicare Other | Admitting: Physical Therapy

## 2023-01-08 ENCOUNTER — Encounter: Payer: Self-pay | Admitting: Physical Therapy

## 2023-01-08 ENCOUNTER — Ambulatory Visit: Payer: Medicare Other | Admitting: Physical Therapy

## 2023-01-08 ENCOUNTER — Ambulatory Visit: Payer: Medicare Other | Attending: Family Medicine | Admitting: Occupational Therapy

## 2023-01-08 DIAGNOSIS — M6281 Muscle weakness (generalized): Secondary | ICD-10-CM

## 2023-01-08 DIAGNOSIS — R278 Other lack of coordination: Secondary | ICD-10-CM | POA: Insufficient documentation

## 2023-01-08 DIAGNOSIS — R2689 Other abnormalities of gait and mobility: Secondary | ICD-10-CM | POA: Insufficient documentation

## 2023-01-08 DIAGNOSIS — R29898 Other symptoms and signs involving the musculoskeletal system: Secondary | ICD-10-CM | POA: Insufficient documentation

## 2023-01-08 DIAGNOSIS — R2681 Unsteadiness on feet: Secondary | ICD-10-CM | POA: Insufficient documentation

## 2023-01-08 DIAGNOSIS — R41840 Attention and concentration deficit: Secondary | ICD-10-CM | POA: Insufficient documentation

## 2023-01-08 DIAGNOSIS — R29818 Other symptoms and signs involving the nervous system: Secondary | ICD-10-CM | POA: Diagnosis not present

## 2023-01-08 DIAGNOSIS — R293 Abnormal posture: Secondary | ICD-10-CM | POA: Insufficient documentation

## 2023-01-08 NOTE — Patient Instructions (Signed)
Large amplitude Hands  With arms stretched out in front of you (elbows straight), perform the following: PWR! Up: Close hands and flick fingers open and apart BIG PWR! Rock: Move wrists up and down [place]! Twist: Twist palms up and down BIG PWR! Step: Touch index finger to thumb while keeping other fingers straight. Flick fingers out BIG (thumb out/straighten fingers). Repeat with other fingers. (Step your thumb to each finger). PWR! Hands: Push hands out BIG. Elbows straight, wrists up, fingers open and spread apart BIG. (Can also perform by pushing down on table, chair, knees. Push above head, out to the side, behind you, in front of you.)  Then, start with elbows bent and hands closed. PWR! Up: Close hands and flick fingers open and apart BIG PWR! Rock: Move wrists up and down [place]! Twist: Twist palms up and down BIG PWR! Step: Touch index finger to thumb while keeping other fingers straight. Flick fingers out BIG (thumb out/straighten fingers). Repeat with other fingers. (Step your thumb to each finger). PWR! Hands: Push hands out BIG. Elbows straight, wrists up, fingers open and spread apart BIG. (Can also perform by pushing down on table, chair, knees. Push above head, out to the side, behind you, in front of you.)   ** Make each movement big and deliberate so that you feel the movement.  Perform at least 10 repetitions 1x/day, but perform PWR! hands throughout the day when you are having trouble using your hands (picking up/manipulating small objects, writing, eating, typing, sewing, buttoning, etc.).

## 2023-01-08 NOTE — Therapy (Signed)
OUTPATIENT OCCUPATIONAL THERAPY PARKINSON'S  Treatment Note  Patient Name: Cody Dickerson MRN: [id number] DOB:1950-09-17, 72 y.o., male Today's Date: 01/08/2023  PCP: Daisy Floro, MD  REFERRING PROVIDER: Tat, Octaviano Batty, DO   END OF SESSION:  OT End of Session - 01/08/23 1123     Visit Number 2    Number of Visits 12    Date for OT Re-Evaluation 02/20/23    Authorization Type Medicare A&B    Progress Note Due on Visit 10    OT Start Time 0933    OT Stop Time 1016    OT Time Calculation (min) 43 min    Activity Tolerance Patient tolerated treatment well    Behavior During Therapy WFL for tasks assessed/performed              Past Medical History:  Diagnosis Date   Balance problem    Gout    Hypercholesterolemia    Hypertension    Parkinson's disease (HCC)    Small bowel obstruction (HCC)    Past Surgical History:  Procedure Laterality Date   BOWEL BLOCKAGE  05/2019   IR ANGIO INTRA EXTRACRAN SEL COM CAROTID INNOMINATE BILAT MOD SED  10/03/2021   IR ANGIO VERTEBRAL SEL VERTEBRAL UNI R MOD SED  10/03/2021   IR RADIOLOGIST EVAL & MGMT  08/19/2021   IR US GUIDE VASC ACCESS RIGHT  10/04/2021   Patient Active Problem List   Diagnosis Date Noted   Pain due to onychomycosis of toenails of both feet 09/03/2022   Porokeratosis 06/04/2022   Parkinson's disease (HCC) 02/06/2021   SBO (small bowel obstruction) (HCC) 07/06/2019    ONSET DATE: 12/15/22 (referral date),  Pt reports Parkinson's dx approx. 3 years ago (approx. 2021)  REFERRING DIAG:   R29.3 (ICD-10-CM) - Abnormal posture  R27.8 (ICD-10-CM) - Other lack of coordination  R25.1 (ICD-10-CM) - Tremor  M62.81 (ICD-10-CM) - Muscle weakness (generalized)  R29.898 (ICD-10-CM) - Other symptoms and signs involving the musculoskeletal system  R26.81 (ICD-10-CM) - Unsteadiness on feet  R26.89 (ICD-10-CM) - Other abnormalities of gait and mobility    THERAPY DIAG:  Other symptoms and signs involving the nervous  system  Other lack of coordination  Muscle weakness (generalized)  Rationale for Evaluation and Treatment: Rehabilitation  SUBJECTIVE:   SUBJECTIVE STATEMENT: Pt reports "my balance continues to deteriorate."  Pt reports frequently forgetting to take his 4 o'clock medicine.    Pt accompanied by: self  PERTINENT HISTORY: 08/12/22 MD Progress Notes and 08/18/22 DO Progress Notes: Parkinson's disease, left distal supraclinoid ICA aneurysm (aneurysm size stable), MCI, HTN, gout, hypercholesterolemia  PRECAUTIONS: Fall, posterior lean when standing/walking   WEIGHT BEARING RESTRICTIONS: No  PAIN:  Are you having pain? No  FALLS: Has patient fallen in last 6 months? Yes. Number of falls Pt reports approx. 4 falls. Pt reports picking up sticks in yard when 2 falls occurred.  Per 12/11/22 PT Screen: Pt reports "Balance is bad". Have had several falls in the yard.   LIVING ENVIRONMENT: Lives with: lives with their spouse Lives in: House/apartment Stairs: Yes: External: 3 steps; on left going up Has following equipment at home: Grab bars  PLOF: Independent with basic ADLs, pt currently drives  PATIENT GOALS: "to do learn how to do things better."  OBJECTIVE:  Note: Objective measures were completed at Evaluation unless otherwise noted.  HAND DOMINANCE: Right  ADLs: Overall ADLs:  Transfers/ambulation related to ADLs: Eating: ind, pt reports slower and more meticulous to get items on  fork. No difficulty with cutting and serving food. Grooming: Pt reports shaving with standard razor blade is slower. Pt has electric shaver available though pt has not attempted to use it. UB Dressing: Pt reports "Not as good putting jacket on as I used to be." Pt currently using cape method to don jacket, a strategy learned in previous OT sessions. Increased difficulty with buttons: "I have more trouble with buttoning buttons placed in front of me than when a shirt is on me." Pt reports taking extra  time to work out zippers if zippers get stuck. LB Dressing: "Getting my pant legs on and off are a little slower but it's acceptable." Toileting: Ind Bathing: Ind with someone else in the home for safety Tub Shower transfers: ind Equipment: Grab bars, Sports administrator, and tub/shower  IADLs: Shopping: spouse completes grocery shopping, pt helps out with carrying heavy water Light housekeeping: pt folds laundry without difficulty Meal Prep: Ind with cutting, pt completes charcoal grilling tasks and pt reports being careful and walking slow around grill for safety. 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 Handwriting: Pt reports handwriting is slower than it used to be, and even slower if he focuses on writing legibly.   Pt wrote "Whales live in the ocean" in 18 seconds. Increased time required and approx. 66% legible.  MOBILITY STATUS: Ind without A/E. PT reported to OT that pt demo's significant posterior lean when standing and walking. Pt reports "imbalance tends to go backwards."  POSTURE COMMENTS:  rounded shoulders and forward head  ACTIVITY TOLERANCE: Activity tolerance: Pt reports increased fatigue and sleepiness during the day. Pt reports sleeping well at night. Pt reports increased fatigue may be d/t medication though pt is unsure.  FUNCTIONAL OUTCOME MEASURES: Fastening/unfastening 3 buttons: 98 seconds. OT noted mild tremors of BUE when completing task.  Physical performance test: PPT#2 (simulated eating) 17 seconds. OT noted mild tremors of RUE when completing task.  PPT#4 (donning/doffing jacket): 18 seconds. With gait belt. OT noted mild tremors of BUE during task.  COORDINATION: 9 Hole Peg test: Right: 36 sec; Left: 36 sec Box and Blocks:  Right 34 blocks, Left 41 blocks  UE ROM:    BUE shoulders flex/ABD - WFL.  BUE elbow flex/ext  - WFL with v/c to fully ext. R elbow ext slower than L elbow ext.  Wrist flex/ext - WFL, v/c to fully ext.  Digits flex/ext - WFL, v/c to fully ext thumbs with slightly decreased AROM of B thumbs noted.  Note: Pt demo'd decreased speed to attain full AROM of BUE.   UE MMT:   Not tested  SENSATION: WFL Pt reports no change.  COGNITION: Overall cognitive status: Within functional limits for tasks assessed. Pt reports more difficulty remembering day of the week, taking medication at 4 PM specifically. Pt reports using calendar to help with memory.   OBSERVATIONS: Pt was pleasant and agreeable. Pt recognized difficulty with ADL/IADL tasks and described current level of functioning well. OT noted Bradykinesia and mild tremors with FM tasks . Pt donned reading glasses for handwriting task. Pt had hematoma on LUE, dorsal aspect of L hand. When asked about hematoma, pt reports "I don't know [how I got it], maybe on screen door, but I bruise easily."  TODAY'S TREATMENT:  DATE:  01/08/23 Medication reminder: OT adding repeat function to reminder app to increase recall of 4PM med.  Established a test run to ensure tone of alert is sufficient to recognize reminder.  Pt was not alerted to test reminder, therefore completed additional trial to assess sound.  Upon second test, pt was able to check his phone afterwards as he again did not recognize the alert due to engaging in handwriting but was able to utilize and recognize need to check the alert.  Pt plans to assess if remind is enough over the weekend and then plan to amend or utilize other type of app if needed Coordination: engaged in picking up variety of small items with RUE with focus on intentional, large amplitude movements.  Pt with most difficulty with smaller and flatter items such as dried beans and paper clips.   Large  amplitude: engaged in finger flicks, thumb opposition, and full hand opening with focus on amplitude and full extension.  Pt with difficulty with full finger extension, however with cues for quality and when given ample time pt with improved extension. Handwriting: pt with improved legibility and sizing of handwriting both name and simple sentence on lined paper after engagement in large amplitude finger flicks.   EVAL: Pt education, see below.   PATIENT EDUCATION: Education details: reminders on phone for med management and other PD specific education for large amplitude and handwriting.  Person educated: Patient Education method: Explanation Education comprehension: verbalized understanding  HOME EXERCISE PROGRAM: N/A  GOALS: Goals reviewed with patient? Yes  GOALS:  SHORT TERM GOALS: Target date: 01/23/23    Pt will be independent with PD specific HEP.  Baseline: not yet initiated Goal status: IN PROGRESS  2.  Pt will verbalize understanding of adapted strategies to maximize safety and independence with ADLs/IADLs.  Baseline: not yet initiated Goal status: IN PROGRESS  3.  Pt will write a simple sentence with at least 75% legibility and complete the sentence in 15 seconds or less.   Baseline: 66% legibility, 18 seconds for simple sentence Goal status: IN PROGRESS  4.  Pt will demonstrate improved fine motor coordination for ADLs as evidenced by completing 9 hole peg test score for BUE in 30 seconds or less.  Baseline: 9 Hole Peg test: Right: 36 sec; Left: 36 sec Goal status: IN PROGRESS   5.  Pt will be able to place at least 45 blocks with BUE hand with completion of Box and Blocks test.  Baseline: Right 34 blocks, Left 41 blocks Goal status: v    LONG TERM GOALS: Target date: 02/20/23    Pt will verbalize understanding of ways to prevent future PD related complications and PD community resources.  Baseline: not yet initiated Goal status: IN  PROGRESS  2.  Pt will write a simulated/sample check with at least 85% legibility while maintaining steady speed.  Baseline: 66% legibility for simple sentence, 18 seconds for simple sentence  Goal status: IN PROGRESS  5.  Pt will demonstrate improved ease with fastening buttons as evidenced by decreasing 3 button/unbutton time to 80 seconds or less.  Baseline: 98 seconds Goal status: IN PROGRESS  3.  Pt will verbalize understanding of ways to keep thinking skills sharp and ways to compensate for STM changes in the future.  Baseline: not yet initiated Goal status: IN PROGRESS  4.  Pt will demonstrate improved ease with feeding as evidenced by decreasing PPT#2 to 15 seconds or less.  Baseline: PPT #2: 17 secs, pt  reports difficulty placing food on fork Goal status: IN PROGRESS  5.  Pt will demonstrate increased ease with dressing as evidenced by decreasing PPT#4 (don/ doff jacket) to 15 secs or less.  Baseline: PPT #4: 18 secs Goal status: IN PROGRESS    ASSESSMENT:  CLINICAL IMPRESSION: Pt demonstrating decreased receptiveness to reminder alerts on phone, however reports that the pop up alert has been enough in the past to trigger his recall to take his afternoon meds when he does pick up his phone.  Brief explanation of use of alarms and/or calendar setting for increased recall, however pt wanting to try reminder app again - as OT reinstated the daily repeat function.  Pt demonstrating improvements with legibility and sizing with handwriting after engaging in large amplitude and coordination activities.  PERFORMANCE DEFICITS: in functional skills including ADLs, IADLs, coordination, dexterity, proprioception, ROM, strength, Fine motor control, Gross motor control, mobility, balance, body mechanics, endurance, and UE functional use, cognitive skills including energy/drive and memory, and psychosocial skills including environmental adaptation.   IMPAIRMENTS: are limiting patient from  ADLs, IADLs, leisure, and social participation.     PLAN:  OT FREQUENCY: 2x/week  OT DURATION: 6 weeks (dates extended to allow for scheduling)  PLANNED INTERVENTIONS: 97168 OT Re-evaluation, 97535 self care/ADL training, 04540 therapeutic exercise, 97530 therapeutic activity, 97112 neuromuscular re-education, 97140 manual therapy, 97035 ultrasound, 97018 paraffin, 98119 fluidotherapy, 97010 moist heat, 97010 cryotherapy, 97032 electrical stimulation (manual), 97014 electrical stimulation unattended, 97760 Orthotics management and training, 14782 Splinting (initial encounter), [id number] Subsequent splinting/medication, passive range of motion, functional mobility training, energy conservation, patient/family education, and DME and/or AE instructions  RECOMMENDED OTHER SERVICES: PT eval already completed on 12/24/22.  CONSULTED AND AGREED WITH PLAN OF CARE: Patient  PLAN FOR NEXT SESSION:   Show pt how to set timers on phone to increase ind with medication management (e.g. timer to remind pt to take medication at 4 PM, per pt report)  Large amplitude movements  FM tasks - including buttoning buttons  Donning/doffing jacket - review strategies (e.g. "cape method")   Cassaundra Rasch, OT 01/08/2023, 11:24 AM

## 2023-01-08 NOTE — Therapy (Signed)
OUTPATIENT PHYSICAL THERAPY NEURO TREATMENT NOTE   Patient Name: Cody Dickerson MRN: [id number] DOB:[birth year], 72 y.o., male Today's Date: 01/08/2023   PCP: Daisy Floro, MD REFERRING PROVIDER: Tat, Octaviano Batty, DO   END OF SESSION:  PT End of Session - 01/08/23 1020     Visit Number 2    Number of Visits 17    Date for PT Re-Evaluation 02/20/23    Authorization Type Medicare/[employer]    Progress Note Due on Visit 10    PT Start Time 1017    PT Stop Time 1059    PT Time Calculation (min) 42 min    Activity Tolerance Patient tolerated treatment well    Behavior During Therapy WFL for tasks assessed/performed              Past Medical History:  Diagnosis Date   Balance problem    Gout    Hypercholesterolemia    Hypertension    Parkinson's disease (HCC)    Small bowel obstruction (HCC)    Past Surgical History:  Procedure Laterality Date   BOWEL BLOCKAGE  05/2019   IR ANGIO INTRA EXTRACRAN SEL COM CAROTID INNOMINATE BILAT MOD SED  10/03/2021   IR ANGIO VERTEBRAL SEL VERTEBRAL UNI R MOD SED  10/03/2021   IR RADIOLOGIST EVAL & MGMT  08/19/2021   IR US GUIDE VASC ACCESS RIGHT  10/04/2021   Patient Active Problem List   Diagnosis Date Noted   Pain due to onychomycosis of toenails of both feet 09/03/2022   Porokeratosis 06/04/2022   Parkinson's disease (HCC) 02/06/2021   SBO (small bowel obstruction) (HCC) 07/06/2019    ONSET DATE: 12/15/2022 (MD referral)  REFERRING DIAG:  R29.3 (ICD-10-CM) - Abnormal posture  R27.8 (ICD-10-CM) - Other lack of coordination  R25.1 (ICD-10-CM) - Tremor  M62.81 (ICD-10-CM) - Muscle weakness (generalized)  R29.898 (ICD-10-CM) - Other symptoms and signs involving the musculoskeletal system  R26.81 (ICD-10-CM) - Unsteadiness on feet  R26.89 (ICD-10-CM) - Other abnormalities of gait and mobility    THERAPY DIAG:  Unsteadiness on feet  Other abnormalities of gait and mobility  Muscle weakness (generalized)  Rationale for  Evaluation and Treatment: Rehabilitation  SUBJECTIVE:                                                                                                                                                                                             SUBJECTIVE STATEMENT: No falls, but my balance is just bad.  Pt accompanied by: self  PERTINENT HISTORY: Left distal supraclinoid ICA aneurysm, MCI  PAIN:  Are you having pain? No  PRECAUTIONS: Fall  RED  FLAGS: None   WEIGHT BEARING RESTRICTIONS: No  FALLS: Has patient fallen in last 6 months? Yes. Number of falls 3-4 -Able to get up on his own from falls, though slowly per report.    LIVING ENVIRONMENT: Lives with: lives with their spouse; pt has to help her at times Lives in: House/apartment Stairs: Yes: External: 3 steps; on right going up Has following equipment at home: None  PLOF: Independent; Spin class 1x/wk, has tried [employer]; walks several times per week; enjoys yard work  PATIENT GOALS: Pt's goals for therapy to get up better from chairs, have better balance.  OBJECTIVE:    TODAY'S TREATMENT: 01/08/2023 Activity Comments  Seated leg strengthening: March 3 x 10 LAQ 3 x 10  Hamstring curls green band, 3 x 10 3#   Difficulty with eccentric control  Hamstring stretch 3 x 30 sec Runner's stretch Cues for form  Sit to stand, 3 x 5 reps Cues to look at eye level, not at ground; jerky type motion through lower legs  Minisquats, 2 x 10 reps, then minisquat to up on toes Focus on wide BOS  Heel raises 2 x 10          Access Code: [email or web address] URL: [email or web address] Date: 01/08/2023 Prepared by: [facility] - [facility]  Exercises - Seated Hamstring Stretch  - 1-2 x daily - 7 x weekly - 1 sets - 3 reps - 30 sec hold - Standing Gastroc Stretch at Counter  - 1-2 x daily - 7 x weekly - 1 sets - 3 reps - 30sec hold - Sit to Stand with Armchair  - 1 x daily - 7 x weekly - 3 sets - 5  reps  PATIENT EDUCATION: Education details: HEP additions, safety for sit<>stand Person educated: Patient Education method: Explanation, Demonstration, and Handouts Education comprehension: verbalized understanding, returned demonstration, and needs further education  ----------------------------------------------------------------- Note: Objective measures below were completed at Evaluation unless otherwise noted.  DIAGNOSTIC FINDINGS: NA per this episode  COGNITION: Overall cognitive status:  hx of MCI per MD note; WFL for session today   SENSATION: WFL  COORDINATION: Slowed RAM with toe tapping  EDEMA:  1+pitting edema BLEs MD is aware-has recommended compression stocking  MUSCLE TONE: Mild increase LLE tone  POSTURE: rounded shoulders, forward head, and posterior pelvic tilt  LOWER EXTREMITY ROM:     Active  Right Eval Left Eval  Hip flexion    Hip extension    Hip abduction    Hip adduction    Hip internal rotation    Hip external rotation    Knee flexion    Knee extension    Ankle dorsiflexion 10 8  Ankle plantarflexion    Ankle inversion    Ankle eversion     (Blank rows = not tested)    TRANSFERS: Assistive device utilized: None  Sit to stand: CGA and without use of BUEs, he has posterior lean, not as prominent with use of BUEs Stand to sit: CGA   GAIT: Gait pattern: step through pattern, decreased step length- Right, decreased step length- Left, shuffling, trunk flexed, and narrow BOS Distance walked: 50 ft x 2 Assistive device utilized: None Level of assistance: SBA  FUNCTIONAL TESTS:  5 times sit to stand: 18 sec with 3 of 5 episodes of posterior lean Timed up and go (TUG): 12.41 sec 10 meter walk test: 10 sec  MiniBESTest:  14/28 TUG cognitive:  23.25 esc TUG manual:  12.94 sec  360 turn R:  13 steps, 4.66 sec 360 turn L:  12 steps, 4.44 sec 24M back:  7.34 sec, small, quick, narrow BOS steps      TODAY'S TREATMENT:                                                                                                                               DATE: 12/24/2022    PATIENT EDUCATION: Education details: Eval results, POC Person educated: Patient Education method: Explanation Education comprehension: verbalized understanding  HOME EXERCISE PROGRAM: Not yet initiated  GOALS: Goals reviewed with patient? Yes  SHORT TERM GOALS: Target date: 01/23/2023  Pt will be independent with HEP for improved balance, strength, gait. Baseline: Goal status: IN PROGRESS  2.  Pt will improve 5x sit<>stand to less than or equal to 15 sec with no posterior lean to demonstrate improved functional strength and transfer efficiency. Baseline: 18 sec, 3 of 5 reps posterior lean Goal status: IN PROGRESS  3.  Pt will improve TUG cognitive score to less than or equal to 18 sec for decreased fall risk. Baseline: 23.45 sec Goal status: IN PROGRESS   LONG TERM GOALS: Target date: 02/20/2023  Pt will be independent with progression of HEP for improved balance, strength, gait. Baseline:  Goal status: IN PROGRESS  2.  Pt will improve 5x sit<>stand to less than or equal to 12.5 sec and no posterior lean to demonstrate improved functional strength and transfer efficiency. Baseline:  Goal status: IN PROGRESS  3.  Pt will improve MiniBESTest score to at least 19/28 to decrease fall risk. Baseline: 14/24 Goal status: IN PROGRESS  4.  Pt will perform 360 turn to R and L in 8 steps or less for improved balance. Baseline: 12-13 steps Goal status: IN PROGRESS  5.  Pt will perform posterior push and release test in 2 steps or less, for improved posterior balance recovery. Baseline: multiple small steps, would fall if unaided Goal status: IN PROGRESS  6.  Pt will verbalize understanding of local Parkinson's disease community resources, including optimal fitness program. Baseline:  Goal status: IN PROGRESS  ASSESSMENT:  CLINICAL  IMPRESSION: Pt returns today for first treatment session following PT eval.  He has had no falls since evaluation, but he does report his balance is still bad.  Worked on lower extremity strengthening and flexibility as well as transfer training.  With attention to task and cues for slowed pace with sit<>stand practice, he demo good form and steadiness.  However, when he gets up quickly, he has definite retropulsion, pushing posteriorly through his chair.  He also demo decreased eccentric control with resisted hamstring exercises.  He will continue to benefit from skilled PT towards goals for improved balance and decreased fall risk.  OBJECTIVE IMPAIRMENTS: Abnormal gait, decreased balance, decreased knowledge of use of DME, decreased mobility, difficulty walking, decreased ROM, decreased strength, impaired flexibility, and postural dysfunction.   ACTIVITY LIMITATIONS: standing, squatting, transfers, and locomotion  level  PARTICIPATION LIMITATIONS: community activity and yard work  PERSONAL FACTORS: 3+ comorbidities: see above  are also affecting patient's functional outcome.   REHAB POTENTIAL: Good  CLINICAL DECISION MAKING: Evolving/moderate complexity  EVALUATION COMPLEXITY: Moderate  PLAN:  PT FREQUENCY: 2x/week  PT DURATION: 8 weeks plus eval  PLANNED INTERVENTIONS: 97110-Therapeutic exercises, 97530- Therapeutic activity, 97112- Neuromuscular re-education, 97535- Self Care, 16109- Manual therapy, [phone]- Gait training, Patient/Family education, Balance training, and DME instructions  PLAN FOR NEXT SESSION: Review HEP-continue to address balance, sit to stand, squats, balance strategies, compliant surface work; exercises to counteract posterior lean/LOB   Aigner Horseman W., PT 01/08/2023, 12:23 PM  [facility] [address] [place], [place] [address] Phone # [phone] Fax # [phone]

## 2023-01-13 ENCOUNTER — Ambulatory Visit: Payer: Medicare Other

## 2023-01-13 ENCOUNTER — Ambulatory Visit: Payer: Medicare Other | Admitting: Occupational Therapy

## 2023-01-13 DIAGNOSIS — R29818 Other symptoms and signs involving the nervous system: Secondary | ICD-10-CM

## 2023-01-13 DIAGNOSIS — M6281 Muscle weakness (generalized): Secondary | ICD-10-CM

## 2023-01-13 DIAGNOSIS — R29898 Other symptoms and signs involving the musculoskeletal system: Secondary | ICD-10-CM | POA: Diagnosis not present

## 2023-01-13 DIAGNOSIS — R41840 Attention and concentration deficit: Secondary | ICD-10-CM

## 2023-01-13 DIAGNOSIS — R2681 Unsteadiness on feet: Secondary | ICD-10-CM

## 2023-01-13 DIAGNOSIS — R293 Abnormal posture: Secondary | ICD-10-CM

## 2023-01-13 DIAGNOSIS — R2689 Other abnormalities of gait and mobility: Secondary | ICD-10-CM | POA: Diagnosis not present

## 2023-01-13 DIAGNOSIS — R278 Other lack of coordination: Secondary | ICD-10-CM | POA: Diagnosis not present

## 2023-01-13 NOTE — Therapy (Signed)
OUTPATIENT PHYSICAL THERAPY NEURO TREATMENT NOTE   Patient Name: Cody Dickerson MRN: [id number] DOB:[birth year], 72 y.o., male Today's Date: 01/15/2023   PCP: Daisy Floro, MD REFERRING PROVIDER: Tat, Octaviano Batty, DO   END OF SESSION:  PT End of Session - 01/15/23 1058     Visit Number 4    Number of Visits 17    Date for PT Re-Evaluation 02/20/23    Authorization Type Medicare/AARP    Progress Note Due on Visit 10    PT Start Time 1016    PT Stop Time 1059    PT Time Calculation (min) 43 min    Equipment Utilized During Treatment Gait belt    Activity Tolerance Patient tolerated treatment well    Behavior During Therapy WFL for tasks assessed/performed               Past Medical History:  Diagnosis Date   Balance problem    Gout    Hypercholesterolemia    Hypertension    Parkinson's disease (HCC)    Small bowel obstruction (HCC)    Past Surgical History:  Procedure Laterality Date   BOWEL BLOCKAGE  05/2019   IR ANGIO INTRA EXTRACRAN SEL COM CAROTID INNOMINATE BILAT MOD SED  10/03/2021   IR ANGIO VERTEBRAL SEL VERTEBRAL UNI R MOD SED  10/03/2021   IR RADIOLOGIST EVAL & MGMT  08/19/2021   IR US GUIDE VASC ACCESS RIGHT  10/04/2021   Patient Active Problem List   Diagnosis Date Noted   Pain due to onychomycosis of toenails of both feet 09/03/2022   Porokeratosis 06/04/2022   Parkinson's disease (HCC) 02/06/2021   SBO (small bowel obstruction) (HCC) 07/06/2019    ONSET DATE: 12/15/2022 (MD referral)  REFERRING DIAG:  R29.3 (ICD-10-CM) - Abnormal posture  R27.8 (ICD-10-CM) - Other lack of coordination  R25.1 (ICD-10-CM) - Tremor  M62.81 (ICD-10-CM) - Muscle weakness (generalized)  R29.898 (ICD-10-CM) - Other symptoms and signs involving the musculoskeletal system  R26.81 (ICD-10-CM) - Unsteadiness on feet  R26.89 (ICD-10-CM) - Other abnormalities of gait and mobility    THERAPY DIAG:  Other symptoms and signs involving the nervous system  Other lack  of coordination  Muscle weakness (generalized)  Unsteadiness on feet  Other abnormalities of gait and mobility  Other symptoms and signs involving the musculoskeletal system  Abnormal posture  Rationale for Evaluation and Treatment: Rehabilitation  SUBJECTIVE:                                                                                                                                                                                             SUBJECTIVE  STATEMENT: Nothing new- just have a lack of balance.   Pt accompanied by: self  PERTINENT HISTORY: Left distal supraclinoid ICA aneurysm, MCI  PAIN:  Are you having pain? No  PRECAUTIONS: Fall  RED FLAGS: None   WEIGHT BEARING RESTRICTIONS: No  FALLS: Has patient fallen in last 6 months? Yes. Number of falls 3-4 -Able to get up on his own from falls, though slowly per report.    LIVING ENVIRONMENT: Lives with: lives with their spouse; pt has to help her at times Lives in: House/apartment Stairs: Yes: External: 3 steps; on right going up Has following equipment at home: None  PLOF: Independent; Spin class 1x/wk, has tried [employer]; walks several times per week; enjoys yard work  PATIENT GOALS: Pt's goals for therapy to get up better from chairs, have better balance.  OBJECTIVE:     TODAY'S TREATMENT: 01/15/23 Activity Comments  STS + chop/reverse chop with ball  10x initially difficult and pt required demo throughout; cues to reset in sitting upright posture. Seat set at 21.25 in  STS + PWR step 5x each  Cueing or larger amplitude step; imbalance evident and significant retropulsion   Sitting trunk flexion 10x  Then STS with same technique 10x  Much reduced retropulsion   step ups B UE support; difficulty maintaining alternating pattern   lateral step ups Cues for posture; limited eccentric control   backwards walk with wide BOS In II bars; cues for longer, controlled steps, promoting upright posture to avoid  backwards momentum   sidestepping onto/off foam Difficulty alternating feet; 1 episode of posterior LOB requiring mod A     Nustep L5 x 6 min UE/LEs Maintaining ~80SPM        HOME EXERCISE PROGRAM Last updated: 01/15/23 Access Code: [email or web address] URL: [email or web address] Date: 01/15/2023 Prepared by: [facility] - Outpatient  Rehab - [facility]  Exercises - Seated Hamstring Stretch  - 1-2 x daily - 7 x weekly - 1 sets - 3 reps - 30 sec hold - Standing Gastroc Stretch at Counter  - 1-2 x daily - 7 x weekly - 1 sets - 3 reps - 30sec hold - Seated Active Hip Flexion  - 1 x daily - 5 x weekly - 2 sets - 10 reps - Sit to Stand with Armchair  - 1 x daily - 7 x weekly - 3 sets - 5 reps   PATIENT EDUCATION: Education details: HEP update Person educated: Patient Education method: Explanation, Demonstration, Tactile cues, Verbal cues, and Handouts Education comprehension: verbalized understanding and returned demonstration   ----------------------------------------------------------------- Note: Objective measures below were completed at Evaluation unless otherwise noted.  DIAGNOSTIC FINDINGS: NA per this episode  COGNITION: Overall cognitive status:  hx of MCI per MD note; WFL for session today   SENSATION: WFL  COORDINATION: Slowed RAM with toe tapping  EDEMA:  1+pitting edema BLEs MD is aware-has recommended compression stocking  MUSCLE TONE: Mild increase LLE tone  POSTURE: rounded shoulders, forward head, and posterior pelvic tilt  LOWER EXTREMITY ROM:     Active  Right Eval Left Eval  Hip flexion    Hip extension    Hip abduction    Hip adduction    Hip internal rotation    Hip external rotation    Knee flexion    Knee extension    Ankle dorsiflexion 10 8  Ankle plantarflexion    Ankle inversion    Ankle eversion     (Blank rows = not tested)  TRANSFERS: Assistive device utilized: None  Sit to stand: CGA and without use of BUEs,  he has posterior lean, not as prominent with use of BUEs Stand to sit: CGA   GAIT: Gait pattern: step through pattern, decreased step length- Right, decreased step length- Left, shuffling, trunk flexed, and narrow BOS Distance walked: 50 ft x 2 Assistive device utilized: None Level of assistance: SBA  FUNCTIONAL TESTS:  5 times sit to stand: 18 sec with 3 of 5 episodes of posterior lean Timed up and go (TUG): 12.41 sec 10 meter walk test: 10 sec  MiniBESTest:  14/28 TUG cognitive:  23.25 esc TUG manual:  12.94 sec 360 turn R:  13 steps, 4.66 sec 360 turn L:  12 steps, 4.44 sec 65M back:  7.34 sec, small, quick, narrow BOS steps      GOALS: Goals reviewed with patient? Yes  SHORT TERM GOALS: Target date: 01/23/2023  Pt will be independent with HEP for improved balance, strength, gait. Baseline: Goal status: IN PROGRESS  2.  Pt will improve 5x sit<>stand to less than or equal to 15 sec with no posterior lean to demonstrate improved functional strength and transfer efficiency. Baseline: 18 sec, 3 of 5 reps posterior lean Goal status: IN PROGRESS  3.  Pt will improve TUG cognitive score to less than or equal to 18 sec for decreased fall risk. Baseline: 23.45 sec Goal status: IN PROGRESS   LONG TERM GOALS: Target date: 02/20/2023  Pt will be independent with progression of HEP for improved balance, strength, gait. Baseline:  Goal status: IN PROGRESS  2.  Pt will improve 5x sit<>stand to less than or equal to 12.5 sec and no posterior lean to demonstrate improved functional strength and transfer efficiency. Baseline:  Goal status: IN PROGRESS  3.  Pt will improve MiniBESTest score to at least 19/28 to decrease fall risk. Baseline: 14/24 Goal status: IN PROGRESS  4.  Pt will perform 360 turn to R and L in 8 steps or less for improved balance. Baseline: 12-13 steps Goal status: IN PROGRESS  5.  Pt will perform posterior push and release test in 2 steps or less, for  improved posterior balance recovery. Baseline: multiple small steps, would fall if unaided Goal status: IN PROGRESS  6.  Pt will verbalize understanding of local Parkinson's disease community resources, including optimal fitness program. Baseline:  Goal status: IN PROGRESS  ASSESSMENT:  CLINICAL IMPRESSION: Patient arrived to session without complaints. Worked on [employer] which revealed imbalance and retropulsion; much improved after cueing and practice. Patient demonstrated difficulty coordinating alternating step patterns. Demonstrated 1 episode of posterior LOB with mod A to recover and demonstrating limited use of reaching strategy to self-correct. HEP was updated- pt reported understanding and without complaints upon leaving.   OBJECTIVE IMPAIRMENTS: Abnormal gait, decreased balance, decreased knowledge of use of DME, decreased mobility, difficulty walking, decreased ROM, decreased strength, impaired flexibility, and postural dysfunction.   ACTIVITY LIMITATIONS: standing, squatting, transfers, and locomotion level  PARTICIPATION LIMITATIONS: community activity and yard work  PERSONAL FACTORS: 3+ comorbidities: see above  are also affecting patient's functional outcome.   REHAB POTENTIAL: Good  CLINICAL DECISION MAKING: Evolving/moderate complexity  EVALUATION COMPLEXITY: Moderate  PLAN:  PT FREQUENCY: 2x/week  PT DURATION: 8 weeks plus eval  PLANNED INTERVENTIONS: 97110-Therapeutic exercises, 97530- Therapeutic activity, [id number]- Neuromuscular re-education, 97535- Self Care, 29562- Manual therapy, [phone]- Gait training, Patient/Family education, Balance training, and DME instructions  PLAN FOR NEXT SESSION: continue to address balance, sit to  stand, squats, balance strategies, compliant surface work; exercises to counteract posterior lean/LOB   Anette Guarneri, PT, DPT 01/15/23 11:00 AM  [facility] at [facility] [address] [place],  [address] [place], [place] [address] Phone # [phone] Fax # [phone]

## 2023-01-13 NOTE — Therapy (Signed)
OUTPATIENT PHYSICAL THERAPY NEURO TREATMENT NOTE   Patient Name: Cody Dickerson MRN: [id number] DOB:1950/11/26, 72 y.o., male Today's Date: 01/13/2023   PCP: Daisy Floro, MD REFERRING PROVIDER: Tat, Octaviano Batty, DO   END OF SESSION:  PT End of Session - 01/13/23 0959     Visit Number 3    Number of Visits 17    Date for PT Re-Evaluation 02/20/23    Authorization Type Medicare/AARP    Progress Note Due on Visit 10    PT Start Time 1015    PT Stop Time 1100    PT Time Calculation (min) 45 min    Activity Tolerance Patient tolerated treatment well    Behavior During Therapy WFL for tasks assessed/performed              Past Medical History:  Diagnosis Date   Balance problem    Gout    Hypercholesterolemia    Hypertension    Parkinson's disease (HCC)    Small bowel obstruction (HCC)    Past Surgical History:  Procedure Laterality Date   BOWEL BLOCKAGE  05/2019   IR ANGIO INTRA EXTRACRAN SEL COM CAROTID INNOMINATE BILAT MOD SED  10/03/2021   IR ANGIO VERTEBRAL SEL VERTEBRAL UNI R MOD SED  10/03/2021   IR RADIOLOGIST EVAL & MGMT  08/19/2021   IR US GUIDE VASC ACCESS RIGHT  10/04/2021   Patient Active Problem List   Diagnosis Date Noted   Pain due to onychomycosis of toenails of both feet 09/03/2022   Porokeratosis 06/04/2022   Parkinson's disease (HCC) 02/06/2021   SBO (small bowel obstruction) (HCC) 07/06/2019    ONSET DATE: 12/15/2022 (MD referral)  REFERRING DIAG:  R29.3 (ICD-10-CM) - Abnormal posture  R27.8 (ICD-10-CM) - Other lack of coordination  R25.1 (ICD-10-CM) - Tremor  M62.81 (ICD-10-CM) - Muscle weakness (generalized)  R29.898 (ICD-10-CM) - Other symptoms and signs involving the musculoskeletal system  R26.81 (ICD-10-CM) - Unsteadiness on feet  R26.89 (ICD-10-CM) - Other abnormalities of gait and mobility    THERAPY DIAG:  Unsteadiness on feet  Other abnormalities of gait and mobility  Muscle weakness (generalized)  Other symptoms and  signs involving the nervous system  Other symptoms and signs involving the musculoskeletal system  Abnormal posture  Rationale for Evaluation and Treatment: Rehabilitation  SUBJECTIVE:                                                                                                                                                                                             SUBJECTIVE STATEMENT: Been doing the HEP that Amy started, no new issues Pt accompanied by: self  PERTINENT HISTORY: Left distal supraclinoid ICA aneurysm, MCI  PAIN:  Are you having pain? No  PRECAUTIONS: Fall  RED FLAGS: None   WEIGHT BEARING RESTRICTIONS: No  FALLS: Has patient fallen in last 6 months? Yes. Number of falls 3-4 -Able to get up on his own from falls, though slowly per report.    LIVING ENVIRONMENT: Lives with: lives with their spouse; pt has to help her at times Lives in: House/apartment Stairs: Yes: External: 3 steps; on right going up Has following equipment at home: None  PLOF: Independent; Spin class 1x/wk, has tried [employer]; walks several times per week; enjoys yard work  PATIENT GOALS: Pt's goals for therapy to get up better from chairs, have better balance.  OBJECTIVE:   TODAY'S TREATMENT: 01/13/23 Activity Comments  Seated LE AROM 2x10 For warm-up  Sit-stand 3x5 reps lower seat height from 25-22" 4# dumbells in front rack position  LAQ 3x10 4#  Alt stair taps 3x10 4#, unilat UE support, 6" box  Sidestep x 2 min 4#, along counter  4-square activities -regular -environmental hazards -multi-tasking -dual tasking+multi-tasking  Gross motor coordination -various throwing forms w/ green med ball 1x10     TODAY'S TREATMENT: 01/08/2023 Activity Comments  Seated leg strengthening: March 3 x 10 LAQ 3 x 10  Hamstring curls green band, 3 x 10 3#   Difficulty with eccentric control  Hamstring stretch 3 x 30 sec Runner's stretch Cues for form  Sit to stand, 3 x 5 reps Cues  to look at eye level, not at ground; jerky type motion through lower legs  Minisquats, 2 x 10 reps, then minisquat to up on toes Focus on wide BOS  Heel raises 2 x 10          Access Code: [email or web address] URL: [email or web address] Date: 01/08/2023 Prepared by: [facility] - Outpatient  Rehab - [facility]  Exercises - Seated Hamstring Stretch  - 1-2 x daily - 7 x weekly - 1 sets - 3 reps - 30 sec hold - Standing Gastroc Stretch at Counter  - 1-2 x daily - 7 x weekly - 1 sets - 3 reps - 30sec hold - Sit to Stand with Armchair  - 1 x daily - 7 x weekly - 3 sets - 5 reps  PATIENT EDUCATION: Education details: HEP additions, safety for sit<>stand Person educated: Patient Education method: Explanation, Demonstration, and Handouts Education comprehension: verbalized understanding, returned demonstration, and needs further education  ----------------------------------------------------------------- Note: Objective measures below were completed at Evaluation unless otherwise noted.  DIAGNOSTIC FINDINGS: NA per this episode  COGNITION: Overall cognitive status:  hx of MCI per MD note; WFL for session today   SENSATION: WFL  COORDINATION: Slowed RAM with toe tapping  EDEMA:  1+pitting edema BLEs MD is aware-has recommended compression stocking  MUSCLE TONE: Mild increase LLE tone  POSTURE: rounded shoulders, forward head, and posterior pelvic tilt  LOWER EXTREMITY ROM:     Active  Right Eval Left Eval  Hip flexion    Hip extension    Hip abduction    Hip adduction    Hip internal rotation    Hip external rotation    Knee flexion    Knee extension    Ankle dorsiflexion 10 8  Ankle plantarflexion    Ankle inversion    Ankle eversion     (Blank rows = not tested)    TRANSFERS: Assistive device utilized: None  Sit to stand: CGA and without use of BUEs, he has  posterior lean, not as prominent with use of BUEs Stand to sit: CGA   GAIT: Gait pattern:  step through pattern, decreased step length- Right, decreased step length- Left, shuffling, trunk flexed, and narrow BOS Distance walked: 50 ft x 2 Assistive device utilized: None Level of assistance: SBA  FUNCTIONAL TESTS:  5 times sit to stand: 18 sec with 3 of 5 episodes of posterior lean Timed up and go (TUG): 12.41 sec 10 meter walk test: 10 sec  MiniBESTest:  14/28 TUG cognitive:  23.25 esc TUG manual:  12.94 sec 360 turn R:  13 steps, 4.66 sec 360 turn L:  12 steps, 4.44 sec 8M back:  7.34 sec, small, quick, narrow BOS steps      GOALS: Goals reviewed with patient? Yes  SHORT TERM GOALS: Target date: 01/23/2023  Pt will be independent with HEP for improved balance, strength, gait. Baseline: Goal status: IN PROGRESS  2.  Pt will improve 5x sit<>stand to less than or equal to 15 sec with no posterior lean to demonstrate improved functional strength and transfer efficiency. Baseline: 18 sec, 3 of 5 reps posterior lean Goal status: IN PROGRESS  3.  Pt will improve TUG cognitive score to less than or equal to 18 sec for decreased fall risk. Baseline: 23.45 sec Goal status: IN PROGRESS   LONG TERM GOALS: Target date: 02/20/2023  Pt will be independent with progression of HEP for improved balance, strength, gait. Baseline:  Goal status: IN PROGRESS  2.  Pt will improve 5x sit<>stand to less than or equal to 12.5 sec and no posterior lean to demonstrate improved functional strength and transfer efficiency. Baseline:  Goal status: IN PROGRESS  3.  Pt will improve MiniBESTest score to at least 19/28 to decrease fall risk. Baseline: 14/24 Goal status: IN PROGRESS  4.  Pt will perform 360 turn to R and L in 8 steps or less for improved balance. Baseline: 12-13 steps Goal status: IN PROGRESS  5.  Pt will perform posterior push and release test in 2 steps or less, for improved posterior balance recovery. Baseline: multiple small steps, would fall if unaided Goal  status: IN PROGRESS  6.  Pt will verbalize understanding of local Parkinson's disease community resources, including optimal fitness program. Baseline:  Goal status: IN PROGRESS  ASSESSMENT:  CLINICAL IMPRESSION: Initiated with seated LE AROM for warm-up and addition of weighted sit to stands from higher seat height and weight to front of body to improve core control and weight to front of center of gravity to reduce tendency for retro-pulsion to good effect.  Resistance training to improve BLE strength to improve activity tolerance and strength for transfers.  Dynamic balance/motor control/coordination drill using 4-square set-up to improve obstacle mgmt and change of direction and addition of barriers and compliant surfaces for negotiation.  Addition of multi-tasking and then dual-tasking with 4-square set-up to improve motor control and reduce postural instability. Gross motor coordination reinforced with throwing forms using 4.4# ball with cues for generating power from BLE to UE for powerful ball throw to induce postural perturbation and improve muscular power.  Increased unsteadiness appreciated with dual-tasking activities > multi-tasking. Continued sessions to progress POC details to improve mobility and reduce risk for falls.   OBJECTIVE IMPAIRMENTS: Abnormal gait, decreased balance, decreased knowledge of use of DME, decreased mobility, difficulty walking, decreased ROM, decreased strength, impaired flexibility, and postural dysfunction.   ACTIVITY LIMITATIONS: standing, squatting, transfers, and locomotion level  PARTICIPATION LIMITATIONS: community activity and yard work  PERSONAL FACTORS: 3+ comorbidities: see above  are also affecting patient's functional outcome.   REHAB POTENTIAL: Good  CLINICAL DECISION MAKING: Evolving/moderate complexity  EVALUATION COMPLEXITY: Moderate  PLAN:  PT FREQUENCY: 2x/week  PT DURATION: 8 weeks plus eval  PLANNED INTERVENTIONS:  97110-Therapeutic exercises, 97530- Therapeutic activity, 97112- Neuromuscular re-education, 97535- Self Care, 32440- Manual therapy, [phone]- Gait training, Patient/Family education, Balance training, and DME instructions  PLAN FOR NEXT SESSION: Review HEP-continue to address balance, sit to stand, squats, balance strategies, compliant surface work; exercises to counteract posterior lean/LOB   Dion Body, PT 01/13/2023, 10:00 AM  [facility] at [facility] [address] [place], [place] [address] Phone # [phone] Fax # [phone]

## 2023-01-13 NOTE — Therapy (Signed)
OUTPATIENT OCCUPATIONAL THERAPY PARKINSON'S  Treatment Note  Patient Name: Cody Dickerson MRN: [id number] DOB:Dec 09, 1950, 72 y.o., male Today's Date: 01/13/2023  PCP: Daisy Floro, MD  REFERRING PROVIDER: Tat, Octaviano Batty, DO   END OF SESSION:  OT End of Session - 01/13/23 0932     Visit Number 3    Number of Visits 12    Date for OT Re-Evaluation 02/20/23    Authorization Type Medicare A&B    Progress Note Due on Visit 10    OT Start Time 0932    OT Stop Time 1015    OT Time Calculation (min) 43 min    Activity Tolerance Patient tolerated treatment well    Behavior During Therapy WFL for tasks assessed/performed               Past Medical History:  Diagnosis Date   Balance problem    Gout    Hypercholesterolemia    Hypertension    Parkinson's disease (HCC)    Small bowel obstruction (HCC)    Past Surgical History:  Procedure Laterality Date   BOWEL BLOCKAGE  05/2019   IR ANGIO INTRA EXTRACRAN SEL COM CAROTID INNOMINATE BILAT MOD SED  10/03/2021   IR ANGIO VERTEBRAL SEL VERTEBRAL UNI R MOD SED  10/03/2021   IR RADIOLOGIST EVAL & MGMT  08/19/2021   IR US GUIDE VASC ACCESS RIGHT  10/04/2021   Patient Active Problem List   Diagnosis Date Noted   Pain due to onychomycosis of toenails of both feet 09/03/2022   Porokeratosis 06/04/2022   Parkinson's disease (HCC) 02/06/2021   SBO (small bowel obstruction) (HCC) 07/06/2019    ONSET DATE: 12/15/22 (referral date),  Pt reports Parkinson's dx approx. 3 years ago (approx. 2021)  REFERRING DIAG:   R29.3 (ICD-10-CM) - Abnormal posture  R27.8 (ICD-10-CM) - Other lack of coordination  R25.1 (ICD-10-CM) - Tremor  M62.81 (ICD-10-CM) - Muscle weakness (generalized)  R29.898 (ICD-10-CM) - Other symptoms and signs involving the musculoskeletal system  R26.81 (ICD-10-CM) - Unsteadiness on feet  R26.89 (ICD-10-CM) - Other abnormalities of gait and mobility    THERAPY DIAG:  Other symptoms and signs involving the  nervous system  Other lack of coordination  Muscle weakness (generalized)  Attention and concentration deficit  Rationale for Evaluation and Treatment: Rehabilitation  SUBJECTIVE:   SUBJECTIVE STATEMENT: Pt reports "I flicked a little bit, 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 No  FALLS: Has patient fallen in last 6 months? Yes. Number of falls Pt reports approx. 4 falls. Pt reports picking up sticks in yard when 2 falls occurred.  Per 12/11/22 PT Screen: Pt reports "Balance is bad". Have had several falls in the yard.   LIVING ENVIRONMENT: Lives with: lives with their spouse Lives in: House/apartment Stairs: Yes: External: 3 steps; on left going up Has following equipment at home: Grab bars  PLOF: Independent with basic ADLs, pt currently drives  PATIENT GOALS: "to do learn how to do things better."  OBJECTIVE:  Note: Objective measures were completed at Evaluation unless otherwise noted.  HAND DOMINANCE: Right  ADLs: Overall ADLs:  Transfers/ambulation related to ADLs: Eating: ind, pt reports slower and  more meticulous to get items on fork. No difficulty with cutting and serving food. Grooming: Pt reports shaving with standard razor blade is slower. Pt has electric shaver available though pt has not attempted to use it. UB Dressing: Pt reports "Not as good putting jacket on as I used to be." Pt currently using cape method to don jacket, a strategy learned in previous OT sessions. Increased difficulty with buttons: "I have more trouble with buttoning buttons placed in front of me than when a shirt is on  me." Pt reports taking extra time to work out zippers if zippers get stuck. LB Dressing: "Getting my pant legs on and off are a little slower but it's acceptable." Toileting: Ind Bathing: Ind with someone else in the home for safety Tub Shower transfers: ind Equipment: Grab bars, Sports administrator, and tub/shower  IADLs: Shopping: spouse completes grocery shopping, pt helps out with carrying heavy water Light housekeeping: pt folds laundry without difficulty Meal Prep: Ind with cutting, pt completes charcoal grilling tasks and pt reports being careful and walking slow around grill for safety. 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 Handwriting: Pt reports handwriting is slower than it used to be, and even slower if he focuses on writing legibly.   Pt wrote "[place]ales live in [place] ocean" in 18 seconds. Increased time required and approx. 66% legible.  MOBILITY STATUS: Ind without A/E. PT reported to OT that pt demo's significant posterior lean when standing and walking. Pt reports "imbalance tends to go backwards."  POSTURE COMMENTS:  rounded shoulders and forward head  ACTIVITY TOLERANCE: Activity tolerance: Pt reports increased fatigue and sleepiness during the day. Pt reports sleeping well at night. Pt reports increased fatigue may be d/t medication though pt is unsure.  FUNCTIONAL OUTCOME MEASURES: Fastening/unfastening 3 buttons: 98 seconds. OT noted mild tremors of BUE when completing task.  Physical performance test: PPT#2 (simulated eating) 17 seconds. OT noted mild tremors of RUE when completing task.  PPT#4 (donning/doffing jacket): 18 seconds. With gait belt. OT noted mild tremors of BUE during task.  COORDINATION: 9 Hole Peg test: Right: 36 sec; Left: 36 sec Box and Blocks:  Right 34 blocks, Left 41 blocks  UE ROM:    BUE shoulders flex/ABD  - WFL.  BUE elbow flex/ext - WFL with v/c to fully ext. R elbow ext slower than L elbow ext.  Wrist flex/ext - WFL, v/c to fully ext.  Digits flex/ext - WFL, v/c to fully ext thumbs with slightly decreased AROM of B thumbs noted.  Note: Pt demo'd decreased speed to attain full AROM of BUE.   UE MMT:   Not tested  SENSATION: WFL Pt reports no change.  COGNITION: Overall cognitive status: Within functional limits for tasks assessed. Pt reports more difficulty remembering day of the week, taking medication at 4 PM specifically. Pt reports using calendar to help with memory.   OBSERVATIONS: Pt was pleasant and agreeable. Pt recognized difficulty with ADL/IADL tasks and described current level of functioning well. OT noted Bradykinesia and mild tremors with FM tasks . Pt donned reading glasses for handwriting task. Pt had hematoma on LUE, dorsal aspect of L hand. When asked about hematoma, pt reports "I don't know [how I got it], maybe on screen door, but I bruise easily."  TODAY'S TREATMENT:  DATE:  01/13/23 Medication reminder: pt reports that reminder is working and provides a light ding which is sufficient to alert him for his afternoon meds. Large amplitude movements: reviewed full hand open/close with elbow extension and finger flicks/thumb opposition with improvements with finger extension.   Coordination: engaged in small peg board pattern replication activity with RUE and LUE.  Pt demonstrating mild increased difficultly with L > R.  Increased challenge to picking up one peg at a time and placing 6-8 in palm and then translating palm to finger tips to place into peg board.  Pt still with difficulty when getting pegs into holes with both techniques, however demonstrating good translation from palm to finger tips.   Buttons: engaged in massed practice with  fastening and unfastening buttons.  Pt demonstrating improvements with use of large amplitude flicks prior to task and with focus on "push and pull" technique with good grasp.     01/08/23 Medication reminder: OT adding repeat function to reminder app to increase recall of 4PM med.  Established a test run to ensure tone of alert is sufficient to recognize reminder.  Pt was not alerted to test reminder, therefore completed additional trial to assess sound.  Upon second test, pt was able to check his phone afterwards as he again did not recognize the alert due to engaging in handwriting but was able to utilize and recognize need to check the alert.  Pt plans to assess if remind is enough over the weekend and then plan to amend or utilize other type of app if needed Coordination: engaged in picking up variety of small items with RUE with focus on intentional, large amplitude movements.  Pt with most difficulty with smaller and flatter items such as dried beans and paper clips.   Large amplitude: engaged in finger flicks, thumb opposition, and full hand opening with focus on amplitude and full extension.  Pt with difficulty with full finger extension, however with cues for quality and when given ample time pt with improved extension. Handwriting: pt with improved legibility and sizing of handwriting both name and simple sentence on lined paper after engagement in large amplitude finger flicks.   EVAL: Pt education, see below.   PATIENT EDUCATION: Education details: reminders on phone for med management and other PD specific education for large amplitude and handwriting.  Person educated: Patient Education method: Explanation Education comprehension: verbalized understanding  HOME EXERCISE PROGRAM: 01/08/23 - large amplitude hands (see pt instructions)  GOALS: Goals reviewed with patient? Yes  GOALS:  SHORT TERM GOALS: Target date: 01/23/23    Pt will be independent with PD specific  HEP.  Baseline: not yet initiated Goal status: IN PROGRESS  2.  Pt will verbalize understanding of adapted strategies to maximize safety and independence with ADLs/IADLs.  Baseline: not yet initiated Goal status: IN PROGRESS  3.  Pt will write a simple sentence with at least 75% legibility and complete the sentence in 15 seconds or less.   Baseline: 66% legibility, 18 seconds for simple sentence Goal status: IN PROGRESS  4.  Pt will demonstrate improved fine motor coordination for ADLs as evidenced by completing 9 hole peg test score for BUE in 30 seconds or less.  Baseline: 9 Hole Peg test: Right: 36 sec; Left: 36 sec Goal status: IN PROGRESS   5.  Pt will be able to place at least 45 blocks with BUE hand with completion of Box and Blocks test.  Baseline: Right 34 blocks, Left 41 blocks Goal  status: v    LONG TERM GOALS: Target date: 02/20/23    Pt will verbalize understanding of ways to prevent future PD related complications and PD community resources.  Baseline: not yet initiated Goal status: IN PROGRESS  2.  Pt will write a simulated/sample check with at least 85% legibility while maintaining steady speed.  Baseline: 66% legibility for simple sentence, 18 seconds for simple sentence  Goal status: IN PROGRESS  5.  Pt will demonstrate improved ease with fastening buttons as evidenced by decreasing 3 button/unbutton time to 80 seconds or less.  Baseline: 98 seconds Goal status: IN PROGRESS  3.  Pt will verbalize understanding of ways to keep thinking skills sharp and ways to compensate for STM changes in the future.  Baseline: not yet initiated Goal status: IN PROGRESS  4.  Pt will demonstrate improved ease with feeding as evidenced by decreasing PPT#2 to 15 seconds or less.  Baseline: PPT #2: 17 secs, pt reports difficulty placing food on fork Goal status: IN PROGRESS  5.  Pt will demonstrate increased ease with dressing as evidenced by decreasing PPT#4  (don/ doff jacket) to 15 secs or less.  Baseline: PPT #4: 18 secs Goal status: IN PROGRESS    ASSESSMENT:  CLINICAL IMPRESSION: Pt engaging in coordination and buttons this session with improved motor control when picking up pegs, however still with difficulty when placing pegs into holes intermittently.  Pt requires increased time for buttons, not timed this session, however demonstrating improvements in technique with massed practice.   PERFORMANCE DEFICITS: in functional skills including ADLs, IADLs, coordination, dexterity, proprioception, ROM, strength, Fine motor control, Gross motor control, mobility, balance, body mechanics, endurance, and UE functional use, cognitive skills including energy/drive and memory, and psychosocial skills including environmental adaptation.   IMPAIRMENTS: are limiting patient from ADLs, IADLs, leisure, and social participation.     PLAN:  OT FREQUENCY: 2x/week  OT DURATION: 6 weeks (dates extended to allow for scheduling)  PLANNED INTERVENTIONS: 97168 OT Re-evaluation, 97535 self care/ADL training, 40981 therapeutic exercise, 97530 therapeutic activity, 97112 neuromuscular re-education, 97140 manual therapy, 97035 ultrasound, 97018 paraffin, 19147 fluidotherapy, 97010 moist heat, 97010 cryotherapy, 97032 electrical stimulation (manual), 97014 electrical stimulation unattended, 97760 Orthotics management and training, 82956 Splinting (initial encounter), [id number] Subsequent splinting/medication, passive range of motion, functional mobility training, energy conservation, patient/family education, and DME and/or AE instructions  RECOMMENDED OTHER SERVICES: PT eval already completed on 12/24/22.  CONSULTED AND AGREED WITH PLAN OF CARE: Patient  PLAN FOR NEXT SESSION:   Large amplitude movements  FM tasks - including buttoning buttons  Donning/doffing jacket - review strategies (e.g. "cape method")   Alize Acy, OT 01/13/2023, 10:23 AM

## 2023-01-15 ENCOUNTER — Encounter: Payer: Self-pay | Admitting: Physical Therapy

## 2023-01-15 ENCOUNTER — Ambulatory Visit: Payer: Medicare Other | Admitting: Occupational Therapy

## 2023-01-15 ENCOUNTER — Ambulatory Visit: Payer: Medicare Other | Admitting: Physical Therapy

## 2023-01-15 DIAGNOSIS — R278 Other lack of coordination: Secondary | ICD-10-CM

## 2023-01-15 DIAGNOSIS — R29818 Other symptoms and signs involving the nervous system: Secondary | ICD-10-CM

## 2023-01-15 DIAGNOSIS — R2689 Other abnormalities of gait and mobility: Secondary | ICD-10-CM

## 2023-01-15 DIAGNOSIS — R29898 Other symptoms and signs involving the musculoskeletal system: Secondary | ICD-10-CM | POA: Diagnosis not present

## 2023-01-15 DIAGNOSIS — M6281 Muscle weakness (generalized): Secondary | ICD-10-CM

## 2023-01-15 DIAGNOSIS — R2681 Unsteadiness on feet: Secondary | ICD-10-CM

## 2023-01-15 DIAGNOSIS — R293 Abnormal posture: Secondary | ICD-10-CM

## 2023-01-15 NOTE — Patient Instructions (Signed)
Bag Exercises:  Small trash bag or produce bag works best.  For all exercises, sit with big posture (sit up tall with head up) and use big movements. Perform the following exercises 1 times per day.  Hold bag in one hand. Stretch both arms/hands out to the side as big as you can. Then, pass bag from one hand to the other IN FRONT of you. Stretch arms back out big after each pass. Repeat 10 times. Hold bag in one hand. Stretch both arms/hands out to the side as big as you can. Then, pass bag from one hand to the other BEHIND you. Stretch arms back out big after each pass. Repeat 10 times. Hold bag in one hand. Stretch both arms/hands out to the side in a diagonal as big as you can. Then, pass bag from one hand to the other IN FRONT of you in a diagonal pattern. Stretch arms back out big after each pass. Repeat 10 times. Hold bag in right hand. Move right hand to reach behind shoulder. Then, reach behind back with left hand to pass bag from right hand to left hand. Switch sides. Repeat 10 times on each side. Hold bag in both hands in front of you with hands/arms shoulder length apart. Move bag behind your head. Repeat 10 times. Hold bag in both hands in front of you with hands/arms shoulder length apart. Lift leg and move bag completely under each foot and back. Repeat 10 times on each side. Hold end of bag in one hand. Stretch fingers out big to draw the entire bag into your palm. Repeat 5 times with each hand. Hold bag in one hand. Toss bag up and catch with the same/opposite hand. Repeat 5-10 times with each hand.

## 2023-01-15 NOTE — Therapy (Signed)
OUTPATIENT OCCUPATIONAL THERAPY PARKINSON'S  Treatment Note  Patient Name: Cody Dickerson MRN: [id number] DOB:Feb 03, 1951, 72 y.o., male Today's Date: 01/15/2023  PCP: Daisy Floro, MD  REFERRING PROVIDER: Tat, Octaviano Batty, DO   END OF SESSION:  OT End of Session - 01/15/23 0938     Visit Number 4    Number of Visits 12    Date for OT Re-Evaluation 02/20/23    Authorization Type Medicare A&B    Progress Note Due on Visit 10    OT Start Time 0932    OT Stop Time 1015    OT Time Calculation (min) 43 min    Activity Tolerance Patient tolerated treatment well    Behavior During Therapy WFL for tasks assessed/performed                Past Medical History:  Diagnosis Date   Balance problem    Gout    Hypercholesterolemia    Hypertension    Parkinson's disease (HCC)    Small bowel obstruction (HCC)    Past Surgical History:  Procedure Laterality Date   BOWEL BLOCKAGE  05/2019   IR ANGIO INTRA EXTRACRAN SEL COM CAROTID INNOMINATE BILAT MOD SED  10/03/2021   IR ANGIO VERTEBRAL SEL VERTEBRAL UNI R MOD SED  10/03/2021   IR RADIOLOGIST EVAL & MGMT  08/19/2021   IR US GUIDE VASC ACCESS RIGHT  10/04/2021   Patient Active Problem List   Diagnosis Date Noted   Pain due to onychomycosis of toenails of both feet 09/03/2022   Porokeratosis 06/04/2022   Parkinson's disease (HCC) 02/06/2021   SBO (small bowel obstruction) (HCC) 07/06/2019    ONSET DATE: 12/15/22 (referral date),  Pt reports Parkinson's dx approx. 3 years ago (approx. 2021)  REFERRING DIAG:   R29.3 (ICD-10-CM) - Abnormal posture  R27.8 (ICD-10-CM) - Other lack of coordination  R25.1 (ICD-10-CM) - Tremor  M62.81 (ICD-10-CM) - Muscle weakness (generalized)  R29.898 (ICD-10-CM) - Other symptoms and signs involving the musculoskeletal system  R26.81 (ICD-10-CM) - Unsteadiness on feet  R26.89 (ICD-10-CM) - Other abnormalities of gait and mobility    THERAPY DIAG:  Other symptoms and signs involving the  nervous system  Other lack of coordination  Muscle weakness (generalized)  Rationale for Evaluation and Treatment: Rehabilitation  SUBJECTIVE:   SUBJECTIVE STATEMENT: Pt reports things are "pretty good".  Pt accompanied by: self  PERTINENT HISTORY: 08/12/22 MD Progress Notes and 08/18/22 DO Progress Notes: Parkinson's disease, left distal supraclinoid ICA aneurysm (aneurysm size stable), MCI, HTN, gout, hypercholesterolemia  PRECAUTIONS: Fall, posterior lean when standing/walking   WEIGHT BEARING RESTRICTIONS: No  PAIN:  Are you having pain? No  FALLS: Has patient fallen in last 6 months? Yes. Number of falls Pt reports approx. 4 falls. Pt reports picking up sticks in yard when 2 falls occurred.  Per 12/11/22 PT Screen: Pt reports "Balance is bad". Have had several falls in the yard.   LIVING ENVIRONMENT: Lives with: lives with their spouse Lives in: House/apartment Stairs: Yes: External: 3 steps; on left going up Has following equipment at home: Grab bars  PLOF: Independent with basic ADLs, pt currently drives  PATIENT GOALS: "to do learn how to do things better."  OBJECTIVE:  Note: Objective measures were completed at Evaluation unless otherwise noted.  HAND DOMINANCE: Right  ADLs: Overall ADLs:  Transfers/ambulation related to ADLs: Eating: ind, pt reports slower and more meticulous to get items on fork. No difficulty with cutting and serving food. Grooming: Pt reports shaving  with standard razor blade is slower. Pt has electric shaver available though pt has not attempted to use it. UB Dressing: Pt reports "Not as good putting jacket on as I used to be." Pt currently using cape method to don jacket, a strategy learned in previous OT sessions. Increased difficulty with buttons: "I have more trouble with buttoning buttons placed in front of me than when a shirt is on me." Pt reports taking extra time to work out zippers if zippers get stuck. LB Dressing: "Getting my  pant legs on and off are a little slower but it's acceptable." Toileting: Ind Bathing: Ind with someone else in the home for safety Tub Shower transfers: ind Equipment: Grab bars, Sports administrator, and tub/shower  IADLs: Shopping: spouse completes grocery shopping, pt helps out with carrying heavy water Light housekeeping: pt folds laundry without difficulty Meal Prep: Ind with cutting, pt completes charcoal grilling tasks and pt reports being careful and walking slow around grill for safety. 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 Handwriting: Pt reports handwriting is slower than it used to be, and even slower if he focuses on writing legibly.   Pt wrote "[place]ales live in [place] ocean" in 18 seconds. Increased time required and approx. 66% legible.  MOBILITY STATUS: Ind without A/E. PT reported to OT that pt demo's significant posterior lean when standing and walking. Pt reports "imbalance tends to go backwards."  POSTURE COMMENTS:  rounded shoulders and forward head  ACTIVITY TOLERANCE: Activity tolerance: Pt reports increased fatigue and sleepiness during the day. Pt reports sleeping well at night. Pt reports increased fatigue may be d/t medication though pt is unsure.  FUNCTIONAL OUTCOME MEASURES: Fastening/unfastening 3 buttons: 98 seconds. OT noted mild tremors of BUE when completing task.  Physical performance test: PPT#2 (simulated eating) 17 seconds. OT noted mild tremors of RUE when completing task.  PPT#4 (donning/doffing jacket): 18 seconds. With gait belt. OT noted mild tremors of BUE during task.  COORDINATION: 9 Hole Peg test: Right: 36 sec; Left: 36 sec Box and Blocks:  Right 34 blocks, Left 41 blocks  UE ROM:    BUE shoulders flex/ABD - WFL.  BUE elbow flex/ext - WFL with v/c to fully ext. R elbow ext slower than L elbow  ext.  Wrist flex/ext - WFL, v/c to fully ext.  Digits flex/ext - WFL, v/c to fully ext thumbs with slightly decreased AROM of B thumbs noted.  Note: Pt demo'd decreased speed to attain full AROM of BUE.   UE MMT:   Not tested  SENSATION: WFL Pt reports no change.  COGNITION: Overall cognitive status: Within functional limits for tasks assessed. Pt reports more difficulty remembering day of the week, taking medication at 4 PM specifically. Pt reports using calendar to help with memory.   OBSERVATIONS: Pt was pleasant and agreeable. Pt recognized difficulty with ADL/IADL tasks and described current level of functioning well. OT noted Bradykinesia and mild tremors with FM tasks . Pt donned reading glasses for handwriting task. Pt had hematoma on LUE, dorsal aspect of L hand. When asked about hematoma, pt reports "I don't know [how I got it], maybe on screen door, but I bruise easily."  TODAY'S TREATMENT:  DATE:  01/15/23 Large ampltiude: OT engaged in bag exercises with pt with focus on large amplitude movements with scarf with focus on carryover to self-care tasks, such as: donning shirt, drying back, pulling down shirt, pulling up socks and donning pants.  OT providing intermittent cues for amplitude and upright sitting posture. Jacket: pt donned/doffed jacket with personal jacket with good technique and amplitude.  Completed x3 with good carryover of "cape" method.    01/13/23 Medication reminder: pt reports that reminder is working and provides a light ding which is sufficient to alert him for his afternoon meds. Large amplitude movements: reviewed full hand open/close with elbow extension and finger flicks/thumb opposition with improvements with finger extension.   Coordination: engaged in small peg board pattern replication activity with RUE and LUE.  Pt demonstrating mild increased difficultly with L > R.   Increased challenge to picking up one peg at a time and placing 6-8 in palm and then translating palm to finger tips to place into peg board.  Pt still with difficulty when getting pegs into holes with both techniques, however demonstrating good translation from palm to finger tips.   Buttons: engaged in massed practice with fastening and unfastening buttons.  Pt demonstrating improvements with use of large amplitude flicks prior to task and with focus on "push and pull" technique with good grasp.    01/08/23 Medication reminder: OT adding repeat function to reminder app to increase recall of 4PM med.  Established a test run to ensure tone of alert is sufficient to recognize reminder.  Pt was not alerted to test reminder, therefore completed additional trial to assess sound.  Upon second test, pt was able to check his phone afterwards as he again did not recognize the alert due to engaging in handwriting but was able to utilize and recognize need to check the alert.  Pt plans to assess if remind is enough over the weekend and then plan to amend or utilize other type of app if needed Coordination: engaged in picking up variety of small items with RUE with focus on intentional, large amplitude movements.  Pt with most difficulty with smaller and flatter items such as dried beans and paper clips.   Large amplitude: engaged in finger flicks, thumb opposition, and full hand opening with focus on amplitude and full extension.  Pt with difficulty with full finger extension, however with cues for quality and when given ample time pt with improved extension. Handwriting: pt with improved legibility and sizing of handwriting both name and simple sentence on lined paper after engagement in large amplitude finger flicks.    PATIENT EDUCATION: Education details:  PD specific education for large amplitude for dressing tasks Person educated: Patient Education method: Explanation Education comprehension: verbalized  understanding  HOME EXERCISE PROGRAM: 01/15/23 - bag exercises (see pt instructions)  01/08/23 - large amplitude hands (see pt instructions)  GOALS: Goals reviewed with patient? Yes  GOALS:  SHORT TERM GOALS: Target date: 01/23/23    Pt will be independent with PD specific HEP.  Baseline: not yet initiated Goal status: IN PROGRESS  2.  Pt will verbalize understanding of adapted strategies to maximize safety and independence with ADLs/IADLs.  Baseline: not yet initiated Goal status: IN PROGRESS  3.  Pt will write a simple sentence with at least 75% legibility and complete the sentence in 15 seconds or less.   Baseline: 66% legibility, 18 seconds for simple sentence Goal status: IN PROGRESS  4.  Pt will demonstrate improved fine  motor coordination for ADLs as evidenced by completing 9 hole peg test score for BUE in 30 seconds or less.  Baseline: 9 Hole Peg test: Right: 36 sec; Left: 36 sec Goal status: IN PROGRESS   5.  Pt will be able to place at least 45 blocks with BUE hand with completion of Box and Blocks test.  Baseline: Right 34 blocks, Left 41 blocks Goal status: IN PROGRESS    LONG TERM GOALS: Target date: 02/20/23    Pt will verbalize understanding of ways to prevent future PD related complications and PD community resources.  Baseline: not yet initiated Goal status: IN PROGRESS  2.  Pt will write a simulated/sample check with at least 85% legibility while maintaining steady speed.  Baseline: 66% legibility for simple sentence, 18 seconds for simple sentence  Goal status: IN PROGRESS  5.  Pt will demonstrate improved ease with fastening buttons as evidenced by decreasing 3 button/unbutton time to 80 seconds or less.  Baseline: 98 seconds Goal status: IN PROGRESS  3.  Pt will verbalize understanding of ways to keep thinking skills sharp and ways to compensate for STM changes in the future.  Baseline: not yet initiated Goal status: IN  PROGRESS  4.  Pt will demonstrate improved ease with feeding as evidenced by decreasing PPT#2 to 15 seconds or less.  Baseline: PPT #2: 17 secs, pt reports difficulty placing food on fork Goal status: IN PROGRESS  5.  Pt will demonstrate increased ease with dressing as evidenced by decreasing PPT#4 (don/ doff jacket) to 15 secs or less.  Baseline: PPT #4: 18 secs Goal status: IN PROGRESS    ASSESSMENT:  CLINICAL IMPRESSION: Pt engaging in large amplitude movements with bag exercises to carryover to increased ease with ADLs.  Pt continues to benefit from intermittent cues for upright sitting posture during exercises.  PERFORMANCE DEFICITS: in functional skills including ADLs, IADLs, coordination, dexterity, proprioception, ROM, strength, Fine motor control, Gross motor control, mobility, balance, body mechanics, endurance, and UE functional use, cognitive skills including energy/drive and memory, and psychosocial skills including environmental adaptation.   IMPAIRMENTS: are limiting patient from ADLs, IADLs, leisure, and social participation.     PLAN:  OT FREQUENCY: 2x/week  OT DURATION: 6 weeks (dates extended to allow for scheduling)  PLANNED INTERVENTIONS: 97168 OT Re-evaluation, 97535 self care/ADL training, 96045 therapeutic exercise, 97530 therapeutic activity, 97112 neuromuscular re-education, 97140 manual therapy, 97035 ultrasound, 97018 paraffin, 40981 fluidotherapy, 97010 moist heat, 97010 cryotherapy, 97032 electrical stimulation (manual), 97014 electrical stimulation unattended, 97760 Orthotics management and training, 19147 Splinting (initial encounter), [id number] Subsequent splinting/medication, passive range of motion, functional mobility training, energy conservation, patient/family education, and DME and/or AE instructions  RECOMMENDED OTHER SERVICES: PT eval already completed on 12/24/22.  CONSULTED AND AGREED WITH PLAN OF CARE: Patient  PLAN FOR NEXT SESSION:    Large amplitude movements  FM tasks - including buttoning buttons  Donning/doffing jacket - review strategies (e.g. "cape method")   Mike Hamre, OT 01/15/2023, 10:08 AM

## 2023-01-20 ENCOUNTER — Other Ambulatory Visit: Payer: Self-pay | Admitting: Neurology

## 2023-01-20 ENCOUNTER — Encounter: Payer: Self-pay | Admitting: Physical Therapy

## 2023-01-20 ENCOUNTER — Ambulatory Visit: Payer: Medicare Other | Admitting: Occupational Therapy

## 2023-01-20 ENCOUNTER — Ambulatory Visit: Payer: Medicare Other | Admitting: Physical Therapy

## 2023-01-20 DIAGNOSIS — R29818 Other symptoms and signs involving the nervous system: Secondary | ICD-10-CM | POA: Diagnosis not present

## 2023-01-20 DIAGNOSIS — R278 Other lack of coordination: Secondary | ICD-10-CM | POA: Diagnosis not present

## 2023-01-20 DIAGNOSIS — R2681 Unsteadiness on feet: Secondary | ICD-10-CM

## 2023-01-20 DIAGNOSIS — M6281 Muscle weakness (generalized): Secondary | ICD-10-CM

## 2023-01-20 DIAGNOSIS — R29898 Other symptoms and signs involving the musculoskeletal system: Secondary | ICD-10-CM | POA: Diagnosis not present

## 2023-01-20 DIAGNOSIS — R2689 Other abnormalities of gait and mobility: Secondary | ICD-10-CM | POA: Diagnosis not present

## 2023-01-20 DIAGNOSIS — G20A1 Parkinson's disease without dyskinesia, without mention of fluctuations: Secondary | ICD-10-CM

## 2023-01-20 NOTE — Therapy (Signed)
OUTPATIENT OCCUPATIONAL THERAPY PARKINSON'S  Treatment Note  Patient Name: Cody Dickerson MRN: [id number] DOB:Aug 01, 1950, 72 y.o., male Today's Date: 01/20/2023  PCP: Daisy Floro, MD  REFERRING PROVIDER: Tat, Octaviano Batty, DO   END OF SESSION:  OT End of Session - 01/20/23 0948     Visit Number 5    Number of Visits 12    Date for OT Re-Evaluation 02/20/23    Authorization Type Medicare A&B    Progress Note Due on Visit 10    OT Start Time 0936    OT Stop Time 1016    OT Time Calculation (min) 40 min    Activity Tolerance Patient tolerated treatment well    Behavior During Therapy [facility] for tasks assessed/performed                 Past Medical History:  Diagnosis Date   Balance problem    Gout    Hypercholesterolemia    Hypertension    Parkinson's disease (HCC)    Small bowel obstruction (HCC)    Past Surgical History:  Procedure Laterality Date   BOWEL BLOCKAGE  05/2019   IR ANGIO INTRA EXTRACRAN SEL COM CAROTID INNOMINATE BILAT MOD SED  10/03/2021   IR ANGIO VERTEBRAL SEL VERTEBRAL UNI R MOD SED  10/03/2021   IR RADIOLOGIST EVAL & MGMT  08/19/2021   IR US GUIDE VASC ACCESS RIGHT  10/04/2021   Patient Active Problem List   Diagnosis Date Noted   Pain due to onychomycosis of toenails of both feet 09/03/2022   Porokeratosis 06/04/2022   Parkinson's disease (HCC) 02/06/2021   SBO (small bowel obstruction) (HCC) 07/06/2019    ONSET DATE: 12/15/22 (referral date),  Pt reports Parkinson's dx approx. 3 years ago (approx. 2021)  REFERRING DIAG:   R29.3 (ICD-10-CM) - Abnormal posture  R27.8 (ICD-10-CM) - Other lack of coordination  R25.1 (ICD-10-CM) - Tremor  M62.81 (ICD-10-CM) - Muscle weakness (generalized)  R29.898 (ICD-10-CM) - Other symptoms and signs involving the musculoskeletal system  R26.81 (ICD-10-CM) - Unsteadiness on feet  R26.89 (ICD-10-CM) - Other abnormalities of gait and mobility    THERAPY DIAG:  Other symptoms and signs involving the  nervous system  Other lack of coordination  Muscle weakness (generalized)  Rationale for Evaluation and Treatment: Rehabilitation  SUBJECTIVE:   SUBJECTIVE STATEMENT: Pt reports trying some of the bag exercises with "medium success".  Pt accompanied by: self  PERTINENT HISTORY: 08/12/22 MD Progress Notes and 08/18/22 DO Progress Notes: Parkinson's disease, left distal supraclinoid ICA aneurysm (aneurysm size stable), MCI, HTN, gout, hypercholesterolemia  PRECAUTIONS: Fall, posterior lean when standing/walking   WEIGHT BEARING RESTRICTIONS: No  PAIN:  Are you having pain? No  FALLS: Has patient fallen in last 6 months? Yes. Number of falls Pt reports approx. 4 falls. Pt reports picking up sticks in yard when 2 falls occurred.  Per 12/11/22 PT Screen: Pt reports "Balance is bad". Have had several falls in the yard.   LIVING ENVIRONMENT: Lives with: lives with their spouse Lives in: House/apartment Stairs: Yes: External: 3 steps; on left going up Has following equipment at home: Grab bars  PLOF: Independent with basic ADLs, pt currently drives  PATIENT GOALS: "to do learn how to do things better."  OBJECTIVE:  Note: Objective measures were completed at Evaluation unless otherwise noted.  HAND DOMINANCE: Right  ADLs: Overall ADLs:  Transfers/ambulation related to ADLs: Eating: ind, pt reports slower and more meticulous to get items on fork. No difficulty with cutting and  serving food. Grooming: Pt reports shaving with standard razor blade is slower. Pt has electric shaver available though pt has not attempted to use it. UB Dressing: Pt reports "Not as good putting jacket on as I used to be." Pt currently using cape method to don jacket, a strategy learned in previous OT sessions. Increased difficulty with buttons: "I have more trouble with buttoning buttons placed in front of me than when a shirt is on me." Pt reports taking extra time to work out zippers if zippers get  stuck. LB Dressing: "Getting my pant legs on and off are a little slower but it's acceptable." Toileting: Ind Bathing: Ind with someone else in the home for safety Tub Shower transfers: ind Equipment: Grab bars, Sports administrator, and tub/shower  IADLs: Shopping: spouse completes grocery shopping, pt helps out with carrying heavy water Light housekeeping: pt folds laundry without difficulty Meal Prep: Ind with cutting, pt completes charcoal grilling tasks and pt reports being careful and walking slow around grill for safety. 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 Handwriting: Pt reports handwriting is slower than it used to be, and even slower if he focuses on writing legibly.   Pt wrote "[place]ales live in [place] ocean" in 18 seconds. Increased time required and approx. 66% legible.  MOBILITY STATUS: Ind without A/E. PT reported to OT that pt demo's significant posterior lean when standing and walking. Pt reports "imbalance tends to go backwards."  POSTURE COMMENTS:  rounded shoulders and forward head  ACTIVITY TOLERANCE: Activity tolerance: Pt reports increased fatigue and sleepiness during the day. Pt reports sleeping well at night. Pt reports increased fatigue may be d/t medication though pt is unsure.  FUNCTIONAL OUTCOME MEASURES: Fastening/unfastening 3 buttons: 98 seconds. OT noted mild tremors of BUE when completing task.  Physical performance test: PPT#2 (simulated eating) 17 seconds. OT noted mild tremors of RUE when completing task.  PPT#4 (donning/doffing jacket): 18 seconds. With gait belt. OT noted mild tremors of BUE during task.  COORDINATION: 9 Hole Peg test: Right: 36 sec; Left: 36 sec Box and Blocks:  Right 34 blocks, Left 41 blocks  UE ROM:    BUE shoulders flex/ABD - WFL.  BUE elbow flex/ext - WFL with v/c to fully ext. R elbow ext  slower than L elbow ext.  Wrist flex/ext - WFL, v/c to fully ext.  Digits flex/ext - WFL, v/c to fully ext thumbs with slightly decreased AROM of B thumbs noted.  Note: Pt demo'd decreased speed to attain full AROM of BUE.   UE MMT:   Not tested  SENSATION: WFL Pt reports no change.  COGNITION: Overall cognitive status: Within functional limits for tasks assessed. Pt reports more difficulty remembering day of the week, taking medication at 4 PM specifically. Pt reports using calendar to help with memory.   OBSERVATIONS: Pt was pleasant and agreeable. Pt recognized difficulty with ADL/IADL tasks and described current level of functioning well. OT noted Bradykinesia and mild tremors with FM tasks . Pt donned reading glasses for handwriting task. Pt had hematoma on LUE, dorsal aspect of L hand. When asked about hematoma, pt reports "I don't know [how I got it], maybe on screen door, but I bruise easily."  TODAY'S TREATMENT:  DATE:  01/20/23 Coordination: w/ each UE and BUEs as appropriate, including: picking up various small objects/coins and placing them in container, picking up coins and stacking/unstacking them, and picking up 5-10 coins 1 at a time and translating palm to fingertips to place in a coin slot, rotating small ball w/ fingertips, tossing/catching ball w/ ipsilateral UE, tossing/catching ball from R <> L hands, flipping cards one at a time off a deck, pushing cards off deck held in-hand using thumb only.  OT providing initial cues and demonstration with focus on large amplitude movements.  OT instructed pt in large amplitude finger flicks prior to [facility] tasks with cues for steady pacing to ensure full opening of hands.      01/15/23 Large ampltiude: OT engaged in bag exercises with pt with focus on large amplitude movements with scarf with focus on carryover to self-care tasks, such as: donning shirt, drying  back, pulling down shirt, pulling up socks and donning pants.  OT providing intermittent cues for amplitude and upright sitting posture. Jacket: pt donned/doffed jacket with personal jacket with good technique and amplitude.  Completed x3 with good carryover of "cape" method.    01/13/23 Medication reminder: pt reports that reminder is working and provides a light ding which is sufficient to alert him for his afternoon meds. Large amplitude movements: reviewed full hand open/close with elbow extension and finger flicks/thumb opposition with improvements with finger extension.   Coordination: engaged in small peg board pattern replication activity with RUE and LUE.  Pt demonstrating mild increased difficultly with L > R.  Increased challenge to picking up one peg at a time and placing 6-8 in palm and then translating palm to finger tips to place into peg board.  Pt still with difficulty when getting pegs into holes with both techniques, however demonstrating good translation from palm to finger tips.   Buttons: engaged in massed practice with fastening and unfastening buttons.  Pt demonstrating improvements with use of large amplitude flicks prior to task and with focus on "push and pull" technique with good grasp.   PATIENT EDUCATION: Education details:  PD specific education for large amplitude/coordination Person educated: Patient Education method: Explanation and Handouts Education comprehension: verbalized understanding  HOME EXERCISE PROGRAM: 01/20/23 - coordination exercises (see pt instructions)  01/15/23 - bag exercises (see pt instructions)  01/08/23 - large amplitude hands (see pt instructions)  GOALS: Goals reviewed with patient? Yes  GOALS:  SHORT TERM GOALS: Target date: 01/23/23    Pt will be independent with PD specific HEP.  Baseline: not yet initiated Goal status: IN PROGRESS  2.  Pt will verbalize understanding of adapted strategies to maximize safety and  independence with ADLs/IADLs.  Baseline: not yet initiated Goal status: IN PROGRESS  3.  Pt will write a simple sentence with at least 75% legibility and complete the sentence in 15 seconds or less.   Baseline: 66% legibility, 18 seconds for simple sentence Goal status: IN PROGRESS  4.  Pt will demonstrate improved fine motor coordination for ADLs as evidenced by completing 9 hole peg test score for BUE in 30 seconds or less.  Baseline: 9 Hole Peg test: Right: 36 sec; Left: 36 sec Goal status: IN PROGRESS   5.  Pt will be able to place at least 45 blocks with BUE hand with completion of Box and Blocks test.  Baseline: Right 34 blocks, Left 41 blocks Goal status: IN PROGRESS    LONG TERM GOALS: Target date: 02/20/23  Pt will verbalize understanding of ways to prevent future PD related complications and PD community resources.  Baseline: not yet initiated Goal status: IN PROGRESS  2.  Pt will write a simulated/sample check with at least 85% legibility while maintaining steady speed.  Baseline: 66% legibility for simple sentence, 18 seconds for simple sentence  Goal status: IN PROGRESS  5.  Pt will demonstrate improved ease with fastening buttons as evidenced by decreasing 3 button/unbutton time to 80 seconds or less.  Baseline: 98 seconds Goal status: IN PROGRESS  3.  Pt will verbalize understanding of ways to keep thinking skills sharp and ways to compensate for STM changes in the future.  Baseline: not yet initiated Goal status: IN PROGRESS  4.  Pt will demonstrate improved ease with feeding as evidenced by decreasing PPT#2 to 15 seconds or less.  Baseline: PPT #2: 17 secs, pt reports difficulty placing food on fork Goal status: IN PROGRESS  5.  Pt will demonstrate increased ease with dressing as evidenced by decreasing PPT#4 (don/ doff jacket) to 15 secs or less.  Baseline: PPT #4: 18 secs Goal status: IN PROGRESS    ASSESSMENT:  CLINICAL IMPRESSION: Pt  engaging in coordination exercises with focus on large amplitude and motor control with picking up various sized items, in-hand manipulation, and rotation.  Pt continues to benefit from intermittent cues for large amplitude during fine and gross motor tasks and upright sitting posture during exercises.  PERFORMANCE DEFICITS: in functional skills including ADLs, IADLs, coordination, dexterity, proprioception, ROM, strength, Fine motor control, Gross motor control, mobility, balance, body mechanics, endurance, and UE functional use, cognitive skills including energy/drive and memory, and psychosocial skills including environmental adaptation.   IMPAIRMENTS: are limiting patient from ADLs, IADLs, leisure, and social participation.     PLAN:  OT FREQUENCY: 2x/week  OT DURATION: 6 weeks (dates extended to allow for scheduling)  PLANNED INTERVENTIONS: 97168 OT Re-evaluation, 97535 self care/ADL training, 52841 therapeutic exercise, 97530 therapeutic activity, 97112 neuromuscular re-education, 97140 manual therapy, 97035 ultrasound, 97018 paraffin, 32440 fluidotherapy, 97010 moist heat, 97010 cryotherapy, 97032 electrical stimulation (manual), 97014 electrical stimulation unattended, 97760 Orthotics management and training, 10272 Splinting (initial encounter), [id number] Subsequent splinting/medication, passive range of motion, functional mobility training, energy conservation, patient/family education, and DME and/or AE instructions  RECOMMENDED OTHER SERVICES: PT eval already completed on 12/24/22.  CONSULTED AND AGREED WITH PLAN OF CARE: Patient  PLAN FOR NEXT SESSION:  Time box and bocks and 9 hole peg test  Large amplitude movements  FM tasks - including buttoning buttons   Donning/doffing jacket - review strategies (e.g. "cape method")   Al Bracewell, OT 01/20/2023, 9:50 AM

## 2023-01-20 NOTE — Patient Instructions (Signed)
Coordination Exercises  Perform the following exercises for 10-15 minutes 1 times per day. Perform with both hand(s). Perform using big movements.  Flipping Cards: Place deck of cards on the table. Flip cards over by opening your hand big to grasp and then turn your palm up big. Deal cards: Hold 1/2 or whole deck in your hand. Use thumb to push card off top of deck with one big push.  Rotate ball with fingertips: Pick up with fingers/thumb and move as much as you can with each turn/movement (clockwise and counter-clockwise). Toss ball from one hand to the other: Toss big/high. Toss ball in the air and catch with the same hand: Toss big/high.  Pick up coins and place in coin bank or container: Pick up with big, intentional movements. Do not drag coin to the edge. Pick up coins and stack one at a time: Pick up with big, intentional movements. Do not drag coin to the edge. (5-10 in a stack) Pick up 5-10 coins one at a time and hold in palm. Then, move coins from palm to fingertips one at time and place in coin bank/container. Pick up various small objects that are different shapes/sizes and place in container. (paperclips, buttons, keys, dried beans/pasta, coins, screws, nuts/bolts, washers, board game pieces, etc.)

## 2023-01-20 NOTE — Therapy (Signed)
OUTPATIENT PHYSICAL THERAPY NEURO TREATMENT NOTE   Patient Name: Cody Dickerson MRN: [id number] DOB:November 12, 1950, 72 y.o., male Today's Date: 01/20/2023   PCP: Daisy Floro, MD REFERRING PROVIDER: Tat, Octaviano Batty, DO   END OF SESSION:  PT End of Session - 01/20/23 1023     Visit Number 5    Number of Visits 17    Date for PT Re-Evaluation 02/20/23    Authorization Type Medicare/[employer]    Progress Note Due on Visit 10    PT Start Time 1022    PT Stop Time 1100    PT Time Calculation (min) 38 min    Equipment Utilized During Treatment Gait belt    Activity Tolerance Patient tolerated treatment well    Behavior During Therapy WFL for tasks assessed/performed               Past Medical History:  Diagnosis Date   Balance problem    Gout    Hypercholesterolemia    Hypertension    Parkinson's disease (HCC)    Small bowel obstruction (HCC)    Past Surgical History:  Procedure Laterality Date   BOWEL BLOCKAGE  05/2019   IR ANGIO INTRA EXTRACRAN SEL COM CAROTID INNOMINATE BILAT MOD SED  10/03/2021   IR ANGIO VERTEBRAL SEL VERTEBRAL UNI R MOD SED  10/03/2021   IR RADIOLOGIST EVAL & MGMT  08/19/2021   IR US GUIDE VASC ACCESS RIGHT  10/04/2021   Patient Active Problem List   Diagnosis Date Noted   Pain due to onychomycosis of toenails of both feet 09/03/2022   Porokeratosis 06/04/2022   Parkinson's disease (HCC) 02/06/2021   SBO (small bowel obstruction) (HCC) 07/06/2019    ONSET DATE: 12/15/2022 (MD referral)  REFERRING DIAG:  R29.3 (ICD-10-CM) - Abnormal posture  R27.8 (ICD-10-CM) - Other lack of coordination  R25.1 (ICD-10-CM) - Tremor  M62.81 (ICD-10-CM) - Muscle weakness (generalized)  R29.898 (ICD-10-CM) - Other symptoms and signs involving the musculoskeletal system  R26.81 (ICD-10-CM) - Unsteadiness on feet  R26.89 (ICD-10-CM) - Other abnormalities of gait and mobility    THERAPY DIAG:  Unsteadiness on feet  Other abnormalities of gait and  mobility  Other symptoms and signs involving the nervous system  Rationale for Evaluation and Treatment: Rehabilitation  SUBJECTIVE:                                                                                                                                                                                             SUBJECTIVE STATEMENT: Not much new.  No falls.  Been raking leaves a lot.    Pt accompanied by: self  PERTINENT HISTORY: Left distal supraclinoid ICA aneurysm, MCI  PAIN:  Are you having pain? No  PRECAUTIONS: Fall  RED FLAGS: None   WEIGHT BEARING RESTRICTIONS: No  FALLS: Has patient fallen in last 6 months? Yes. Number of falls 3-4 -Able to get up on his own from falls, though slowly per report.    LIVING ENVIRONMENT: Lives with: lives with their spouse; pt has to help her at times Lives in: House/apartment Stairs: Yes: External: 3 steps; on right going up Has following equipment at home: None  PLOF: Independent; Spin class 1x/wk, has tried [employer]; walks several times per week; enjoys yard work  PATIENT GOALS: Pt's goals for therapy to get up better from chairs, have better balance.  OBJECTIVE:    TODAY'S TREATMENT: 01/20/2023 Activity Comments  NuStep, Level 5, 4 extremities x 6 minutes Varying resistance intervals for 30 sec  STS with PWR! Up, 2 x 5 STS with PWR! Step, 2 x 5 From 21" mat, then 19" mat Difficulty sequencing step after STS, better with repetition  Forward/back walk at counter 5 reps, cues for increased step length  Back step and weightshfit, 2 x 10 at counter -added balance perturbations in posterior direction   -has several episodes of festination  Forward step and weightshift over obstacle, 2 x 10 reps UE support  Standing on Airex:  forward>back step and weightshift x 10 UE support  Wide BOS forward/back walk, 3 x 20 ft Visual and verbal cues to keep wider BOS  Side step over obstacle, 2 x 10 reps Cues for wide BOS       HOME EXERCISE PROGRAM Last updated: 01/15/23 Access Code: [email or web address] URL: [email or web address] Date: 01/15/2023 Prepared by: [facility] - Outpatient  Rehab - [facility]  Exercises - Seated Hamstring Stretch  - 1-2 x daily - 7 x weekly - 1 sets - 3 reps - 30 sec hold - Standing Gastroc Stretch at Counter  - 1-2 x daily - 7 x weekly - 1 sets - 3 reps - 30sec hold - Seated Active Hip Flexion  - 1 x daily - 5 x weekly - 2 sets - 10 reps - Sit to Stand with Armchair  - 1 x daily - 7 x weekly - 3 sets - 5 reps   PATIENT EDUCATION: Education details: HEP update Person educated: Patient Education method: Explanation, Demonstration, Tactile cues, Verbal cues, and Handouts Education comprehension: verbalized understanding and returned demonstration   ----------------------------------------------------------------- Note: Objective measures below were completed at Evaluation unless otherwise noted.  DIAGNOSTIC FINDINGS: NA per this episode  COGNITION: Overall cognitive status:  hx of MCI per MD note; WFL for session today   SENSATION: WFL  COORDINATION: Slowed RAM with toe tapping  EDEMA:  1+pitting edema BLEs MD is aware-has recommended compression stocking  MUSCLE TONE: Mild increase LLE tone  POSTURE: rounded shoulders, forward head, and posterior pelvic tilt  LOWER EXTREMITY ROM:     Active  Right Eval Left Eval  Hip flexion    Hip extension    Hip abduction    Hip adduction    Hip internal rotation    Hip external rotation    Knee flexion    Knee extension    Ankle dorsiflexion 10 8  Ankle plantarflexion    Ankle inversion    Ankle eversion     (Blank rows = not tested)    TRANSFERS: Assistive device utilized: None  Sit to stand: CGA and without use of BUEs, he has posterior  lean, not as prominent with use of BUEs Stand to sit: CGA   GAIT: Gait pattern: step through pattern, decreased step length- Right, decreased step  length- Left, shuffling, trunk flexed, and narrow BOS Distance walked: 50 ft x 2 Assistive device utilized: None Level of assistance: SBA  FUNCTIONAL TESTS:  5 times sit to stand: 18 sec with 3 of 5 episodes of posterior lean Timed up and go (TUG): 12.41 sec 10 meter walk test: 10 sec  MiniBESTest:  14/28 TUG cognitive:  23.25 esc TUG manual:  12.94 sec 360 turn R:  13 steps, 4.66 sec 360 turn L:  12 steps, 4.44 sec 73M back:  7.34 sec, small, quick, narrow BOS steps      GOALS: Goals reviewed with patient? Yes  SHORT TERM GOALS: Target date: 01/23/2023  Pt will be independent with HEP for improved balance, strength, gait. Baseline: Goal status: IN PROGRESS  2.  Pt will improve 5x sit<>stand to less than or equal to 15 sec with no posterior lean to demonstrate improved functional strength and transfer efficiency. Baseline: 18 sec, 3 of 5 reps posterior lean Goal status: IN PROGRESS  3.  Pt will improve TUG cognitive score to less than or equal to 18 sec for decreased fall risk. Baseline: 23.45 sec Goal status: IN PROGRESS   LONG TERM GOALS: Target date: 02/20/2023  Pt will be independent with progression of HEP for improved balance, strength, gait. Baseline:  Goal status: IN PROGRESS  2.  Pt will improve 5x sit<>stand to less than or equal to 12.5 sec and no posterior lean to demonstrate improved functional strength and transfer efficiency. Baseline:  Goal status: IN PROGRESS  3.  Pt will improve MiniBESTest score to at least 19/28 to decrease fall risk. Baseline: 14/24 Goal status: IN PROGRESS  4.  Pt will perform 360 turn to R and L in 8 steps or less for improved balance. Baseline: 12-13 steps Goal status: IN PROGRESS  5.  Pt will perform posterior push and release test in 2 steps or less, for improved posterior balance recovery. Baseline: multiple small steps, would fall if unaided Goal status: IN PROGRESS  6.  Pt will verbalize understanding of local  Parkinson's disease community resources, including optimal fitness program. Baseline:  Goal status: IN PROGRESS  ASSESSMENT:  CLINICAL IMPRESSION: Skilled PT session focused on balance in posterior direction and to address narrow BOS.  Pt requires verbal and visual cues for widened BOS to assist with lateral weightshifting through hips, but he continues to prefer more narrow BOS.  Worked on sit to stand with postural cues and then with stepping, which initially, pt has posterior LOB and gets better with repetition.  He will continue to benefit from skilled PT towards goals for improved functional mobility and decreased fall risk.  OBJECTIVE IMPAIRMENTS: Abnormal gait, decreased balance, decreased knowledge of use of DME, decreased mobility, difficulty walking, decreased ROM, decreased strength, impaired flexibility, and postural dysfunction.   ACTIVITY LIMITATIONS: standing, squatting, transfers, and locomotion level  PARTICIPATION LIMITATIONS: community activity and yard work  PERSONAL FACTORS: 3+ comorbidities: see above  are also affecting patient's functional outcome.   REHAB POTENTIAL: Good  CLINICAL DECISION MAKING: Evolving/moderate complexity  EVALUATION COMPLEXITY: Moderate  PLAN:  PT FREQUENCY: 2x/week  PT DURATION: 8 weeks plus eval  PLANNED INTERVENTIONS: 97110-Therapeutic exercises, 97530- Therapeutic activity, [id number]- Neuromuscular re-education, 97535- Self Care, [id number]- Manual therapy, [phone]- Gait training, Patient/Family education, Balance training, and DME instructions  PLAN FOR NEXT SESSION: *Check STGS*,  Continue to address balance, sit to stand, squats, balance strategies, compliant surface work; exercises to counteract posterior lean/LOB   Lonia Blood, PT 01/20/23 11:06 AM Phone: [phone] Fax: [phone]   [facility] at [facility] [address] [place], [place] [address] Phone # [phone] Fax # [phone]

## 2023-01-22 ENCOUNTER — Ambulatory Visit: Payer: Medicare Other | Admitting: Physical Therapy

## 2023-01-22 ENCOUNTER — Ambulatory Visit: Payer: Medicare Other | Admitting: Occupational Therapy

## 2023-01-22 ENCOUNTER — Encounter: Payer: Self-pay | Admitting: Physical Therapy

## 2023-01-22 DIAGNOSIS — R29818 Other symptoms and signs involving the nervous system: Secondary | ICD-10-CM | POA: Diagnosis not present

## 2023-01-22 DIAGNOSIS — R29898 Other symptoms and signs involving the musculoskeletal system: Secondary | ICD-10-CM | POA: Diagnosis not present

## 2023-01-22 DIAGNOSIS — M6281 Muscle weakness (generalized): Secondary | ICD-10-CM | POA: Diagnosis not present

## 2023-01-22 DIAGNOSIS — R2689 Other abnormalities of gait and mobility: Secondary | ICD-10-CM | POA: Diagnosis not present

## 2023-01-22 DIAGNOSIS — R278 Other lack of coordination: Secondary | ICD-10-CM | POA: Diagnosis not present

## 2023-01-22 DIAGNOSIS — R2681 Unsteadiness on feet: Secondary | ICD-10-CM

## 2023-01-22 NOTE — Therapy (Signed)
OUTPATIENT OCCUPATIONAL THERAPY PARKINSON'S  Treatment Note  Patient Name: Cody Dickerson MRN: [id number] DOB:07-Nov-1950, 72 y.o., male Today's Date: 01/22/2023  PCP: Daisy Floro, MD  REFERRING PROVIDER: Tat, Octaviano Batty, DO   END OF SESSION:  OT End of Session - 01/22/23 0934     Visit Number 6    Number of Visits 12    Date for OT Re-Evaluation 02/20/23    Authorization Type Medicare A&B    Progress Note Due on Visit 10    OT Start Time 0933    OT Stop Time 1015    OT Time Calculation (min) 42 min    Activity Tolerance Patient tolerated treatment well    Behavior During Therapy WFL for tasks assessed/performed                  Past Medical History:  Diagnosis Date   Balance problem    Gout    Hypercholesterolemia    Hypertension    Parkinson's disease (HCC)    Small bowel obstruction (HCC)    Past Surgical History:  Procedure Laterality Date   BOWEL BLOCKAGE  05/2019   IR ANGIO INTRA EXTRACRAN SEL COM CAROTID INNOMINATE BILAT MOD SED  10/03/2021   IR ANGIO VERTEBRAL SEL VERTEBRAL UNI R MOD SED  10/03/2021   IR RADIOLOGIST EVAL & MGMT  08/19/2021   IR US GUIDE VASC ACCESS RIGHT  10/04/2021   Patient Active Problem List   Diagnosis Date Noted   Pain due to onychomycosis of toenails of both feet 09/03/2022   Porokeratosis 06/04/2022   Parkinson's disease (HCC) 02/06/2021   SBO (small bowel obstruction) (HCC) 07/06/2019    ONSET DATE: 12/15/22 (referral date),  Pt reports Parkinson's dx approx. 3 years ago (approx. 2021)  REFERRING DIAG:   R29.3 (ICD-10-CM) - Abnormal posture  R27.8 (ICD-10-CM) - Other lack of coordination  R25.1 (ICD-10-CM) - Tremor  M62.81 (ICD-10-CM) - Muscle weakness (generalized)  R29.898 (ICD-10-CM) - Other symptoms and signs involving the musculoskeletal system  R26.81 (ICD-10-CM) - Unsteadiness on feet  R26.89 (ICD-10-CM) - Other abnormalities of gait and mobility    THERAPY DIAG:  Other symptoms and signs involving the  nervous system  Other lack of coordination  Muscle weakness (generalized)  Unsteadiness on feet  Rationale for Evaluation and Treatment: Rehabilitation  SUBJECTIVE:   SUBJECTIVE STATEMENT: Pt reports unable to work with deck of cards as he has misplaced his cards from previous time working with therapy.    Pt accompanied by: self  PERTINENT HISTORY: 08/12/22 MD Progress Notes and 08/18/22 DO Progress Notes: Parkinson's disease, left distal supraclinoid ICA aneurysm (aneurysm size stable), MCI, HTN, gout, hypercholesterolemia  PRECAUTIONS: Fall, posterior lean when standing/walking   WEIGHT BEARING RESTRICTIONS: No  PAIN:  Are you having pain? No  FALLS: Has patient fallen in last 6 months? Yes. Number of falls Pt reports approx. 4 falls. Pt reports picking up sticks in yard when 2 falls occurred.  Per 12/11/22 PT Screen: Pt reports "Balance is bad". Have had several falls in the yard.   LIVING ENVIRONMENT: Lives with: lives with their spouse Lives in: House/apartment Stairs: Yes: External: 3 steps; on left going up Has following equipment at home: Grab bars  PLOF: Independent with basic ADLs, pt currently drives  PATIENT GOALS: "to do learn how to do things better."  OBJECTIVE:  Note: Objective measures were completed at Evaluation unless otherwise noted.  HAND DOMINANCE: Right  ADLs: Overall ADLs:  Transfers/ambulation related to ADLs: Eating:  ind, pt reports slower and more meticulous to get items on fork. No difficulty with cutting and serving food. Grooming: Pt reports shaving with standard razor blade is slower. Pt has electric shaver available though pt has not attempted to use it. UB Dressing: Pt reports "Not as good putting jacket on as I used to be." Pt currently using cape method to don jacket, a strategy learned in previous OT sessions. Increased difficulty with buttons: "I have more trouble with buttoning buttons placed in front of me than when a shirt is on  me." Pt reports taking extra time to work out zippers if zippers get stuck. LB Dressing: "Getting my pant legs on and off are a little slower but it's acceptable." Toileting: Ind Bathing: Ind with someone else in the home for safety Tub Shower transfers: ind Equipment: Grab bars, Sports administrator, and tub/shower  IADLs: Shopping: spouse completes grocery shopping, pt helps out with carrying heavy water Light housekeeping: pt folds laundry without difficulty Meal Prep: Ind with cutting, pt completes charcoal grilling tasks and pt reports being careful and walking slow around grill for safety. 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 Handwriting: Pt reports handwriting is slower than it used to be, and even slower if he focuses on writing legibly.   Pt wrote "[place]ales live in [place] ocean" in 18 seconds. Increased time required and approx. 66% legible.  MOBILITY STATUS: Ind without A/E. PT reported to OT that pt demo's significant posterior lean when standing and walking. Pt reports "imbalance tends to go backwards."  POSTURE COMMENTS:  rounded shoulders and forward head  ACTIVITY TOLERANCE: Activity tolerance: Pt reports increased fatigue and sleepiness during the day. Pt reports sleeping well at night. Pt reports increased fatigue may be d/t medication though pt is unsure.  FUNCTIONAL OUTCOME MEASURES: Fastening/unfastening 3 buttons: 98 seconds. OT noted mild tremors of BUE when completing task.  Physical performance test: PPT#2 (simulated eating) 17 seconds. OT noted mild tremors of RUE when completing task.  PPT#4 (donning/doffing jacket): 18 seconds. With gait belt. OT noted mild tremors of BUE during task.  COORDINATION: 01/22/23: 9 Hole Peg test: Right: 43.5 sec; Left: 41.25 sec Box and Blocks:  Right 38 blocks, Left 38 blocks  EVAL: 9 Hole Peg  test: Right: 36 sec; Left: 36 sec Box and Blocks:  Right 34 blocks, Left 41 blocks  UE ROM:    BUE shoulders flex/ABD - WFL.  BUE elbow flex/ext - WFL with v/c to fully ext. R elbow ext slower than L elbow ext.  Wrist flex/ext - WFL, v/c to fully ext.  Digits flex/ext - WFL, v/c to fully ext thumbs with slightly decreased AROM of B thumbs noted.  Note: Pt demo'd decreased speed to attain full AROM of BUE.   UE MMT:   Not tested  SENSATION: WFL Pt reports no change.  COGNITION: Overall cognitive status: Within functional limits for tasks assessed. Pt reports more difficulty remembering day of the week, taking medication at 4 PM specifically. Pt reports using calendar to help with memory.   OBSERVATIONS: Pt was pleasant and agreeable. Pt recognized difficulty with ADL/IADL tasks and described current level of functioning well. OT noted Bradykinesia and mild tremors with FM tasks . Pt donned reading glasses for handwriting task. Pt had hematoma on LUE, dorsal aspect of L hand. When asked about hematoma, pt reports "I don't know [how I got it],  maybe on screen door, but I bruise easily."  TODAY'S TREATMENT:                                                                      DATE:  01/22/23 Large amplitude: engaged in thumb opposition with focus on opening each finger into full, large extension and then finger flicks with focus on elbow extension, wrist extension, and full finger extension.   Coordination: engaged in picking up coins one at a time with focus on intentional, large amplitude when picking up coins and placing first into container and then into coin slot.  Pt demonstrating min-mod difficulty picking up penny > quarter. 9 hole peg test: pt initially with slower measurements even after large amplitude movements, therefore completed coordination with coins and then second attempt with 9 hole peg test with 1.5 second improvement on R and no improvement on L.   Handwriting: pt  writing simple sentence "Whales live in the ocean" in 26.41 sec.  Pt demonstrating 75% legibility, however requiring increased time.  Completed in print as well, however with smaller letters, mixture of capital and lower case, and increased time of 33.9 seconds.  Completed additional sentence with focus on exaggerating sizing and use of lined paper for increased sizing and legibility.  Reiterated use of larger pen with pen grip to improve ease with writing and improve legibility.   01/20/23 Coordination: w/ each UE and BUEs as appropriate, including: picking up various small objects/coins and placing them in container, picking up coins and stacking/unstacking them, and picking up 5-10 coins 1 at a time and translating palm to fingertips to place in a coin slot, rotating small ball w/ fingertips, tossing/catching ball w/ ipsilateral UE, tossing/catching ball from R <> L hands, flipping cards one at a time off a deck, pushing cards off deck held in-hand using thumb only.  OT providing initial cues and demonstration with focus on large amplitude movements.  OT instructed pt in large amplitude finger flicks prior to [facility] tasks with cues for steady pacing to ensure full opening of hands.      01/15/23 Large ampltiude: OT engaged in bag exercises with pt with focus on large amplitude movements with scarf with focus on carryover to self-care tasks, such as: donning shirt, drying back, pulling down shirt, pulling up socks and donning pants.  OT providing intermittent cues for amplitude and upright sitting posture. Jacket: pt donned/doffed jacket with personal jacket with good technique and amplitude.  Completed x3 with good carryover of "cape" method.    01/13/23 Medication reminder: pt reports that reminder is working and provides a light ding which is sufficient to alert him for his afternoon meds. Large amplitude movements: reviewed full hand open/close with elbow extension and finger flicks/thumb  opposition with improvements with finger extension.   Coordination: engaged in small peg board pattern replication activity with RUE and LUE.  Pt demonstrating mild increased difficultly with L > R.  Increased challenge to picking up one peg at a time and placing 6-8 in palm and then translating palm to finger tips to place into peg board.  Pt still with difficulty when getting pegs into holes with both techniques, however demonstrating good translation from palm to finger tips.   Buttons:  engaged in massed practice with fastening and unfastening buttons.  Pt demonstrating improvements with use of large amplitude flicks prior to task and with focus on "push and pull" technique with good grasp.   PATIENT EDUCATION: Education details:  PD specific education for large amplitude/coordination Person educated: Patient Education method: Explanation and Handouts Education comprehension: verbalized understanding  HOME EXERCISE PROGRAM: 01/20/23 - coordination exercises (see pt instructions)  01/15/23 - bag exercises (see pt instructions)  01/08/23 - large amplitude hands (see pt instructions)  GOALS: Goals reviewed with patient? Yes  GOALS:  SHORT TERM GOALS: Target date: 01/23/23    Pt will be independent with PD specific HEP.  Baseline: not yet initiated Goal status: MET - completing large amplitude, bag exercises, and coordination HEP on 01/22/23  2.  Pt will verbalize understanding of adapted strategies to maximize safety and independence with ADLs/IADLs.  Baseline: not yet initiated Goal status: MET - 01/22/23  3.  Pt will write a simple sentence with at least 75% legibility and complete the sentence in 15 seconds or less.   Baseline: 66% legibility, 18 seconds for simple sentence Goal status: PARTIALLY MET - required increased time (26 sec) however improved legibility to 75% on 01/22/23  4.  Pt will demonstrate improved fine motor coordination for ADLs as evidenced by completing  9 hole peg test score for BUE in 30 seconds or less.  Baseline: 9 Hole Peg test: Right: 36 sec; Left: 36 sec Goal status: NOT MET - Right: 43.5 sec; Left: 41.25 sec on 01/22/23   5.  Pt will be able to place at least 45 blocks with BUE hand with completion of Box and Blocks test.  Baseline: Right 34 blocks, Left 41 blocks Goal status: NOT MET - Completed R: 38 and L: 38 on 01/22/23    LONG TERM GOALS: Target date: 02/20/23    Pt will verbalize understanding of ways to prevent future PD related complications and PD community resources.  Baseline: not yet initiated Goal status: IN PROGRESS  2.  Pt will write a simulated/sample check with at least 85% legibility while maintaining steady speed.  Baseline: 66% legibility for simple sentence, 18 seconds for simple sentence  Goal status: IN PROGRESS  5.  Pt will demonstrate improved ease with fastening buttons as evidenced by decreasing 3 button/unbutton time to 80 seconds or less.  Baseline: 98 seconds Goal status: IN PROGRESS  3.  Pt will verbalize understanding of ways to keep thinking skills sharp and ways to compensate for STM changes in the future.  Baseline: not yet initiated Goal status: IN PROGRESS  4.  Pt will demonstrate improved ease with feeding as evidenced by decreasing PPT#2 to 15 seconds or less.  Baseline: PPT #2: 17 secs, pt reports difficulty placing food on fork Goal status: IN PROGRESS  5.  Pt will demonstrate increased ease with dressing as evidenced by decreasing PPT#4 (don/ doff jacket) to 15 secs or less.  Baseline: PPT #4: 18 secs Goal status: IN PROGRESS    ASSESSMENT:  CLINICAL IMPRESSION: Pt meeting 2 of 5 STGs this session.  Pt demonstrating decreased speed with handwriting, 9 hole peg test and box and blocks this session.  Educated on weather, timing of PD meds, and sleep impacting ease and timing with coordination.  Engaging in coordination exercises with focus on large amplitude and motor  control with picking up and manipulating coins.  Pt continues to benefit from intermittent cues for large amplitude during fine and gross motor tasks and  upright sitting posture during exercises.  PERFORMANCE DEFICITS: in functional skills including ADLs, IADLs, coordination, dexterity, proprioception, ROM, strength, Fine motor control, Gross motor control, mobility, balance, body mechanics, endurance, and UE functional use, cognitive skills including energy/drive and memory, and psychosocial skills including environmental adaptation.   IMPAIRMENTS: are limiting patient from ADLs, IADLs, leisure, and social participation.     PLAN:  OT FREQUENCY: 2x/week  OT DURATION: 6 weeks (dates extended to allow for scheduling)  PLANNED INTERVENTIONS: 97168 OT Re-evaluation, 97535 self care/ADL training, 10272 therapeutic exercise, 97530 therapeutic activity, 97112 neuromuscular re-education, 97140 manual therapy, 97035 ultrasound, 97018 paraffin, 53664 fluidotherapy, 97010 moist heat, 97010 cryotherapy, 97032 electrical stimulation (manual), 97014 electrical stimulation unattended, 97760 Orthotics management and training, 40347 Splinting (initial encounter), [id number] Subsequent splinting/medication, passive range of motion, functional mobility training, energy conservation, patient/family education, and DME and/or AE instructions  RECOMMENDED OTHER SERVICES: PT eval already completed on 12/24/22.  CONSULTED AND AGREED WITH PLAN OF CARE: Patient  PLAN FOR NEXT SESSION:   Large amplitude movements  FM tasks - including buttoning buttons   Donning/doffing jacket - review strategies (e.g. "cape method")   Montreal Steidle, OT 01/22/2023, 9:34 AM

## 2023-01-22 NOTE — Therapy (Signed)
OUTPATIENT PHYSICAL THERAPY NEURO TREATMENT NOTE   Patient Name: Cody Dickerson MRN: [id number] DOB:06-07-1950, 72 y.o., male Today's Date: 01/22/2023   PCP: Daisy Floro, MD REFERRING PROVIDER: Tat, Octaviano Batty, DO   END OF SESSION:  PT End of Session - 01/22/23 1018     Visit Number 6    Number of Visits 17    Date for PT Re-Evaluation 02/20/23    Authorization Type Medicare/[employer]    Progress Note Due on Visit 10    PT Start Time 1018    PT Stop Time 1059    PT Time Calculation (min) 41 min    Equipment Utilized During Treatment Gait belt    Activity Tolerance Patient tolerated treatment well    Behavior During Therapy WFL for tasks assessed/performed               Past Medical History:  Diagnosis Date   Balance problem    Gout    Hypercholesterolemia    Hypertension    Parkinson's disease (HCC)    Small bowel obstruction (HCC)    Past Surgical History:  Procedure Laterality Date   BOWEL BLOCKAGE  05/2019   IR ANGIO INTRA EXTRACRAN SEL COM CAROTID INNOMINATE BILAT MOD SED  10/03/2021   IR ANGIO VERTEBRAL SEL VERTEBRAL UNI R MOD SED  10/03/2021   IR RADIOLOGIST EVAL & MGMT  08/19/2021   IR US GUIDE VASC ACCESS RIGHT  10/04/2021   Patient Active Problem List   Diagnosis Date Noted   Pain due to onychomycosis of toenails of both feet 09/03/2022   Porokeratosis 06/04/2022   Parkinson's disease (HCC) 02/06/2021   SBO (small bowel obstruction) (HCC) 07/06/2019    ONSET DATE: 12/15/2022 (MD referral)  REFERRING DIAG:  R29.3 (ICD-10-CM) - Abnormal posture  R27.8 (ICD-10-CM) - Other lack of coordination  R25.1 (ICD-10-CM) - Tremor  M62.81 (ICD-10-CM) - Muscle weakness (generalized)  R29.898 (ICD-10-CM) - Other symptoms and signs involving the musculoskeletal system  R26.81 (ICD-10-CM) - Unsteadiness on feet  R26.89 (ICD-10-CM) - Other abnormalities of gait and mobility    THERAPY DIAG:  Other symptoms and signs involving the nervous system  Unsteadiness  on feet  Other abnormalities of gait and mobility  Muscle weakness (generalized)  Rationale for Evaluation and Treatment: Rehabilitation  SUBJECTIVE:                                                                                                                                                                                             SUBJECTIVE STATEMENT: Nothing new today.    Pt accompanied by: self  PERTINENT HISTORY: Left distal supraclinoid  ICA aneurysm, MCI  PAIN:  Are you having pain? No  PRECAUTIONS: Fall  RED FLAGS: None   WEIGHT BEARING RESTRICTIONS: No  FALLS: Has patient fallen in last 6 months? Yes. Number of falls 3-4 -Able to get up on his own from falls, though slowly per report.    LIVING ENVIRONMENT: Lives with: lives with their spouse; pt has to help her at times Lives in: House/apartment Stairs: Yes: External: 3 steps; on right going up Has following equipment at home: None  PLOF: Independent; Spin class 1x/wk, has tried [employer]; walks several times per week; enjoys yard work  PATIENT GOALS: Pt's goals for therapy to get up better from chairs, have better balance.  OBJECTIVE:    TODAY'S TREATMENT: 01/22/2023 Activity Comments  Reviewed HEP Good return demo; still some posterior lean with sit to stand  Minisquats at counter, 2 x 10 reps   FTSTS:  16.72 sec 2 episodes of posterior lean; arms crossed at chest  FTSTS 15.13 sec Arms at chair rails  TUG:  12.22 TUG cognitive:  15.53 sec   Back step and weightshift 2 x 10 At counter, cues for posture  Side step and weightshift, 2 x 10 Over obstacle  Standing on [employer] beam: -Heel-toe raises for a-p weightshift -marching in place -standing on beam with BUE support, then alt UE reaching -feet apart with EO no support, 3 x 30 sec       -posterior lean, cues to help pt recover with hip/ankle strategy, as he reaches for bars with UEs      Access Code: [email or web address] URL:  [email or web address] Date: 01/22/2023 Prepared by: [facility] Out[facility]  Rehab - [facility]  Exercises - Seated Hamstring Stretch  - 1-2 x daily - 7 x weekly - 1 sets - 3 reps - 30 sec hold - Standing Gastroc Stretch at Counter  - 1-2 x daily - 7 x weekly - 1 sets - 3 reps - 30sec hold - Seated Active Hip Flexion  - 1 x daily - 5 x weekly - 2 sets - 10 reps - Sit to Stand with Armchair  - 1 x daily - 7 x weekly - 3 sets - 5 reps - Mini Squat with Counter Support  - 1 x daily - 5 x weekly - 3 sets - 10 reps - Alternating Step Backward with Support  - 1 x daily - 5 x weekly - 2 sets - 10 reps - Side Stepping with Counter Support  - 1 x daily - 5 x weekly - 2 sets - 10 reps    PATIENT EDUCATION: Education details: HEP updates Person educated: Patient Education method: Explanation, Demonstration, Tactile cues, Verbal cues, and Handouts Education comprehension: verbalized understanding and returned demonstration   ----------------------------------------------------------------- Note: Objective measures below were completed at Evaluation unless otherwise noted.  DIAGNOSTIC FINDINGS: NA per this episode  COGNITION: Overall cognitive status:  hx of MCI per MD note; WFL for session today   SENSATION: WFL  COORDINATION: Slowed RAM with toe tapping  EDEMA:  1+pitting edema BLEs MD is aware-has recommended compression stocking  MUSCLE TONE: Mild increase LLE tone  POSTURE: rounded shoulders, forward head, and posterior pelvic tilt  LOWER EXTREMITY ROM:     Active  Right Eval Left Eval  Hip flexion    Hip extension    Hip abduction    Hip adduction    Hip internal rotation    Hip external rotation    Knee flexion  Knee extension    Ankle dorsiflexion 10 8  Ankle plantarflexion    Ankle inversion    Ankle eversion     (Blank rows = not tested)    TRANSFERS: Assistive device utilized: None  Sit to stand: CGA and without use of BUEs, he has  posterior lean, not as prominent with use of BUEs Stand to sit: CGA   GAIT: Gait pattern: step through pattern, decreased step length- Right, decreased step length- Left, shuffling, trunk flexed, and narrow BOS Distance walked: 50 ft x 2 Assistive device utilized: None Level of assistance: SBA  FUNCTIONAL TESTS:  5 times sit to stand: 18 sec with 3 of 5 episodes of posterior lean Timed up and go (TUG): 12.41 sec 10 meter walk test: 10 sec  MiniBESTest:  14/28 TUG cognitive:  23.25 esc TUG manual:  12.94 sec 360 turn R:  13 steps, 4.66 sec 360 turn L:  12 steps, 4.44 sec 85M back:  7.34 sec, small, quick, narrow BOS steps      GOALS: Goals reviewed with patient? Yes  SHORT TERM GOALS: Target date: 01/23/2023  Pt will be independent with HEP for improved balance, strength, gait. Baseline: Goal status: MET 01/22/2023  2.  Pt will improve 5x sit<>stand to less than or equal to 15 sec with no posterior lean to demonstrate improved functional strength and transfer efficiency. Baseline: 18 sec, 3 of 5 reps posterior lean>15.13 sec with UE support at chair, no posterior lean Goal status: MET 01/22/2023  3.  Pt will improve TUG cognitive score to less than or equal to 18 sec for decreased fall risk. Baseline: 23.45 sec>15.53 sec Goal status: MET 01/22/2023   LONG TERM GOALS: Target date: 02/20/2023  Pt will be independent with progression of HEP for improved balance, strength, gait. Baseline:  Goal status: IN PROGRESS  2.  Pt will improve 5x sit<>stand to less than or equal to 12.5 sec and no posterior lean to demonstrate improved functional strength and transfer efficiency. Baseline:  Goal status: IN PROGRESS  3.  Pt will improve MiniBESTest score to at least 19/28 to decrease fall risk. Baseline: 14/24 Goal status: IN PROGRESS  4.  Pt will perform 360 turn to R and L in 8 steps or less for improved balance. Baseline: 12-13 steps Goal status: IN PROGRESS  5.  Pt  will perform posterior push and release test in 2 steps or less, for improved posterior balance recovery. Baseline: multiple small steps, would fall if unaided Goal status: IN PROGRESS  6.  Pt will verbalize understanding of local Parkinson's disease community resources, including optimal fitness program. Baseline:  Goal status: IN PROGRESS  ASSESSMENT:  CLINICAL IMPRESSION: Assessed STGs this visit, with pt meeting 3 of 3 STGs.  He does demo improvement in FTSTS and TUG cognitive scores.  Discussed that his sit to stand technique is optimal (with less to no posterior LOB or retropulsion) when he uses arms to gently push up from surfaces.  Added to HEP to address step strategies for balance and functional strength.  With compliant surface balance today, he has posterior LOB, reaching for BUE support to recover.  With repetition and cues, he is able to use ankle/hip strategy to come more to a forward/neutral position, but PT provides tactile cues and close supervision/min guard.  He will need more work on this to lessen posterior tendency with standing.  OBJECTIVE IMPAIRMENTS: Abnormal gait, decreased balance, decreased knowledge of use of DME, decreased mobility, difficulty walking, decreased ROM,  decreased strength, impaired flexibility, and postural dysfunction.   ACTIVITY LIMITATIONS: standing, squatting, transfers, and locomotion level  PARTICIPATION LIMITATIONS: community activity and yard work  PERSONAL FACTORS: 3+ comorbidities: see above  are also affecting patient's functional outcome.   REHAB POTENTIAL: Good  CLINICAL DECISION MAKING: Evolving/moderate complexity  EVALUATION COMPLEXITY: Moderate  PLAN:  PT FREQUENCY: 2x/week  PT DURATION: 8 weeks plus eval  PLANNED INTERVENTIONS: 97110-Therapeutic exercises, 97530- Therapeutic activity, 97112- Neuromuscular re-education, 97535- Self Care, 16109- Manual therapy, [phone]- Gait training, Patient/Family education, Balance  training, and DME instructions  PLAN FOR NEXT SESSION: Continue to address balance, sit to stand, squats, balance strategies, compliant surface work to counteract posterior lean/LOB   Lonia Blood, PT 01/22/23 11:00 AM Phone: [phone] Fax: [phone]   [facility] [address], [address] [place], [place] [address] Phone # [phone] Fax # [phone]

## 2023-01-24 ENCOUNTER — Other Ambulatory Visit: Payer: Self-pay | Admitting: Neurology

## 2023-01-24 DIAGNOSIS — G20A1 Parkinson's disease without dyskinesia, without mention of fluctuations: Secondary | ICD-10-CM

## 2023-01-26 ENCOUNTER — Ambulatory Visit: Payer: Medicare Other | Admitting: Physical Therapy

## 2023-01-26 ENCOUNTER — Other Ambulatory Visit: Payer: Self-pay | Admitting: Neurology

## 2023-01-26 ENCOUNTER — Ambulatory Visit: Payer: Medicare Other | Admitting: Occupational Therapy

## 2023-01-26 ENCOUNTER — Encounter: Payer: Self-pay | Admitting: Physical Therapy

## 2023-01-26 DIAGNOSIS — M6281 Muscle weakness (generalized): Secondary | ICD-10-CM | POA: Diagnosis not present

## 2023-01-26 DIAGNOSIS — R2681 Unsteadiness on feet: Secondary | ICD-10-CM

## 2023-01-26 DIAGNOSIS — R2689 Other abnormalities of gait and mobility: Secondary | ICD-10-CM

## 2023-01-26 DIAGNOSIS — R278 Other lack of coordination: Secondary | ICD-10-CM | POA: Diagnosis not present

## 2023-01-26 DIAGNOSIS — G20A1 Parkinson's disease without dyskinesia, without mention of fluctuations: Secondary | ICD-10-CM

## 2023-01-26 DIAGNOSIS — R29818 Other symptoms and signs involving the nervous system: Secondary | ICD-10-CM | POA: Diagnosis not present

## 2023-01-26 DIAGNOSIS — R29898 Other symptoms and signs involving the musculoskeletal system: Secondary | ICD-10-CM | POA: Diagnosis not present

## 2023-01-26 NOTE — Therapy (Signed)
OUTPATIENT OCCUPATIONAL THERAPY PARKINSON'S  Treatment Note  Patient Name: Cody Dickerson MRN: [id number] DOB:30-May-1950, 72 y.o., male Today's Date: 01/26/2023  PCP: Daisy Floro, MD  REFERRING PROVIDER: Tat, Octaviano Batty, DO   END OF SESSION:  OT End of Session - 01/26/23 1222     Visit Number 7    Number of Visits 12    Date for OT Re-Evaluation 02/20/23    Authorization Type Medicare A&B    Progress Note Due on Visit 10    OT Start Time 1151    OT Stop Time 1231    OT Time Calculation (min) 40 min    Activity Tolerance Patient tolerated treatment well    Behavior During Therapy WFL for tasks assessed/performed                   Past Medical History:  Diagnosis Date   Balance problem    Gout    Hypercholesterolemia    Hypertension    Parkinson's disease (HCC)    Small bowel obstruction (HCC)    Past Surgical History:  Procedure Laterality Date   BOWEL BLOCKAGE  05/2019   IR ANGIO INTRA EXTRACRAN SEL COM CAROTID INNOMINATE BILAT MOD SED  10/03/2021   IR ANGIO VERTEBRAL SEL VERTEBRAL UNI R MOD SED  10/03/2021   IR RADIOLOGIST EVAL & MGMT  08/19/2021   IR US GUIDE VASC ACCESS RIGHT  10/04/2021   Patient Active Problem List   Diagnosis Date Noted   Pain due to onychomycosis of toenails of both feet 09/03/2022   Porokeratosis 06/04/2022   Parkinson's disease (HCC) 02/06/2021   SBO (small bowel obstruction) (HCC) 07/06/2019    ONSET DATE: 12/15/22 (referral date),  Pt reports Parkinson's dx approx. 3 years ago (approx. 2021)  REFERRING DIAG:   R29.3 (ICD-10-CM) - Abnormal posture  R27.8 (ICD-10-CM) - Other lack of coordination  R25.1 (ICD-10-CM) - Tremor  M62.81 (ICD-10-CM) - Muscle weakness (generalized)  R29.898 (ICD-10-CM) - Other symptoms and signs involving the musculoskeletal system  R26.81 (ICD-10-CM) - Unsteadiness on feet  R26.89 (ICD-10-CM) - Other abnormalities of gait and mobility    THERAPY DIAG:  Other symptoms and signs involving  the nervous system  Muscle weakness (generalized)  Other lack of coordination  Rationale for Evaluation and Treatment: Rehabilitation  SUBJECTIVE:   SUBJECTIVE STATEMENT: Pt reports having a fall Weds/Thurs when he was picking up items in the yard.  Pt reports no injury.  Pt reports remind app has been sufficient to help him recall his afternoon PD med.  Pt accompanied by: self  PERTINENT HISTORY: 08/12/22 MD Progress Notes and 08/18/22 DO Progress Notes: Parkinson's disease, left distal supraclinoid ICA aneurysm (aneurysm size stable), MCI, HTN, gout, hypercholesterolemia  PRECAUTIONS: Fall, posterior lean when standing/walking   WEIGHT BEARING RESTRICTIONS: No  PAIN:  Are you having pain? No  FALLS: Has patient fallen in last 6 months? Yes. Number of falls Pt reports approx. 4 falls. Pt reports picking up sticks in yard when 2 falls occurred.  Per 12/11/22 PT Screen: Pt reports "Balance is bad". Have had several falls in the yard.   LIVING ENVIRONMENT: Lives with: lives with their spouse Lives in: House/apartment Stairs: Yes: External: 3 steps; on left going up Has following equipment at home: Grab bars  PLOF: Independent with basic ADLs, pt currently drives  PATIENT GOALS: "to do learn how to do things better."  OBJECTIVE:  Note: Objective measures were completed at Evaluation unless otherwise noted.  HAND DOMINANCE: Right  ADLs: Overall ADLs:  Transfers/ambulation related to ADLs: Eating: ind, pt reports slower and more meticulous to get items on fork. No difficulty with cutting and serving food. Grooming: Pt reports shaving with standard razor blade is slower. Pt has electric shaver available though pt has not attempted to use it. UB Dressing: Pt reports "Not as good putting jacket on as I used to be." Pt currently using cape method to don jacket, a strategy learned in previous OT sessions. Increased difficulty with buttons: "I have more trouble with buttoning  buttons placed in front of me than when a shirt is on me." Pt reports taking extra time to work out zippers if zippers get stuck. LB Dressing: "Getting my pant legs on and off are a little slower but it's acceptable." Toileting: Ind Bathing: Ind with someone else in the home for safety Tub Shower transfers: ind Equipment: Grab bars, Sports administrator, and tub/shower  IADLs: Shopping: spouse completes grocery shopping, pt helps out with carrying heavy water Light housekeeping: pt folds laundry without difficulty Meal Prep: Ind with cutting, pt completes charcoal grilling tasks and pt reports being careful and walking slow around grill for safety. 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 Handwriting: Pt reports handwriting is slower than it used to be, and even slower if he focuses on writing legibly.   Pt wrote "[place]ales live in [place] ocean" in 18 seconds. Increased time required and approx. 66% legible.  MOBILITY STATUS: Ind without A/E. PT reported to OT that pt demo's significant posterior lean when standing and walking. Pt reports "imbalance tends to go backwards."  POSTURE COMMENTS:  rounded shoulders and forward head  ACTIVITY TOLERANCE: Activity tolerance: Pt reports increased fatigue and sleepiness during the day. Pt reports sleeping well at night. Pt reports increased fatigue may be d/t medication though pt is unsure.  FUNCTIONAL OUTCOME MEASURES: Fastening/unfastening 3 buttons: 98 seconds. OT noted mild tremors of BUE when completing task.  Physical performance test: PPT#2 (simulated eating) 17 seconds. OT noted mild tremors of RUE when completing task.  PPT#4 (donning/doffing jacket): 18 seconds. With gait belt. OT noted mild tremors of BUE during task.  COORDINATION: 01/22/23: 9 Hole Peg test: Right: 43.5 sec; Left: 41.25 sec Box and Blocks:   Right 38 blocks, Left 38 blocks  EVAL: 9 Hole Peg test: Right: 36 sec; Left: 36 sec Box and Blocks:  Right 34 blocks, Left 41 blocks  UE ROM:    BUE shoulders flex/ABD - WFL.  BUE elbow flex/ext - WFL with v/c to fully ext. R elbow ext slower than L elbow ext.  Wrist flex/ext - WFL, v/c to fully ext.  Digits flex/ext - WFL, v/c to fully ext thumbs with slightly decreased AROM of B thumbs noted.  Note: Pt demo'd decreased speed to attain full AROM of BUE.   UE MMT:   Not tested  SENSATION: WFL Pt reports no change.  COGNITION: Overall cognitive status: Within functional limits for tasks assessed. Pt reports more difficulty remembering day of the week, taking medication at 4 PM specifically. Pt reports using calendar to help with memory.   OBSERVATIONS: Pt was pleasant and agreeable. Pt recognized difficulty with ADL/IADL tasks and described current level of functioning well. OT noted Bradykinesia and mild tremors with FM tasks . Pt donned reading glasses for handwriting task. Pt had hematoma on LUE, dorsal aspect of L hand. When asked about hematoma,  pt reports "I don't know [how I got it], maybe on screen door, but I bruise easily."  TODAY'S TREATMENT:                                                                      DATE:  01/26/23 Cards: with RUE and LUE, engaged in flipping cards one at a time off a deck, pushing cards off deck held in-hand using thumb only, and rotating card in between fingers clockwise and counter clockwise.  OT providing demonstration and cues to facilitate increased large amplitude forearm movements and hand opening with sorting cards, especially when extending finger tips during release of cards.  Pt demonstrating improved ease of dealing cards of deck with thumb on L hand over R hand.  Pt demonstrating difficulty with rotating cards between all fingers, therefore focused on use of thumb, index, and long finger only in both directions bilaterally.    Coordination: Attempted rotating pen in between fingers with pt demonstrating improved control, however still unable to rotate throughout all fingers.  OT also educated pt on modifications of card exercises to use with coins to facilitate increased carryover, as pt reports he has still yet to locate his deck of cards.  OT also educating on functional carryover of each movement above. 9 hole peg test: R: 36.85 sec and L: 41.47 sec.    01/22/23 Large amplitude: engaged in thumb opposition with focus on opening each finger into full, large extension and then finger flicks with focus on elbow extension, wrist extension, and full finger extension.   Coordination: engaged in picking up coins one at a time with focus on intentional, large amplitude when picking up coins and placing first into container and then into coin slot.  Pt demonstrating min-mod difficulty picking up penny > quarter. 9 hole peg test: pt initially with slower measurements even after large amplitude movements, therefore completed coordination with coins and then second attempt with 9 hole peg test with 1.5 second improvement on R and no improvement on L.   Handwriting: pt writing simple sentence "[place]ales live in [place] ocean" in 26.41 sec.  Pt demonstrating 75% legibility, however requiring increased time.  Completed in print as well, however with smaller letters, mixture of capital and lower case, and increased time of 33.9 seconds.  Completed additional sentence with focus on exaggerating sizing and use of lined paper for increased sizing and legibility.  Reiterated use of larger pen with pen grip to improve ease with writing and improve legibility.   01/20/23 Coordination: w/ each UE and BUEs as appropriate, including: picking up various small objects/coins and placing them in container, picking up coins and stacking/unstacking them, and picking up 5-10 coins 1 at a time and translating palm to fingertips to place in a coin slot,  rotating small ball w/ fingertips, tossing/catching ball w/ ipsilateral UE, tossing/catching ball from R <> L hands, flipping cards one at a time off a deck, pushing cards off deck held in-hand using thumb only.  OT providing initial cues and demonstration with focus on large amplitude movements.  OT instructed pt in large amplitude finger flicks prior to [facility] tasks with cues for steady pacing to ensure full opening of hands.     PATIENT EDUCATION: Education  details:  PD specific education for large amplitude/coordination Person educated: Patient Education method: Explanation and Handouts Education comprehension: verbalized understanding  HOME EXERCISE PROGRAM: 01/20/23 - coordination exercises (see pt instructions)  01/15/23 - bag exercises (see pt instructions)  01/08/23 - large amplitude hands (see pt instructions)  GOALS: Goals reviewed with patient? Yes  GOALS:  SHORT TERM GOALS: Target date: 01/23/23    Pt will be independent with PD specific HEP.  Baseline: not yet initiated Goal status: MET - completing large amplitude, bag exercises, and coordination HEP on 01/22/23  2.  Pt will verbalize understanding of adapted strategies to maximize safety and independence with ADLs/IADLs.  Baseline: not yet initiated Goal status: MET - 01/22/23  3.  Pt will write a simple sentence with at least 75% legibility and complete the sentence in 15 seconds or less.   Baseline: 66% legibility, 18 seconds for simple sentence Goal status: PARTIALLY MET - required increased time (26 sec) however improved legibility to 75% on 01/22/23  4.  Pt will demonstrate improved fine motor coordination for ADLs as evidenced by completing 9 hole peg test score for BUE in 30 seconds or less.  Baseline: 9 Hole Peg test: Right: 36 sec; Left: 36 sec Goal status: NOT MET - Right: 43.5 sec; Left: 41.25 sec on 01/22/23   5.  Pt will be able to place at least 45 blocks with BUE hand with completion of Box and  Blocks test.  Baseline: Right 34 blocks, Left 41 blocks Goal status: NOT MET - Completed R: 38 and L: 38 on 01/22/23    LONG TERM GOALS: Target date: 02/20/23    Pt will verbalize understanding of ways to prevent future PD related complications and PD community resources.  Baseline: not yet initiated Goal status: IN PROGRESS  2.  Pt will write a simulated/sample check with at least 85% legibility while maintaining steady speed.  Baseline: 66% legibility for simple sentence, 18 seconds for simple sentence  Goal status: IN PROGRESS  5.  Pt will demonstrate improved ease with fastening buttons as evidenced by decreasing 3 button/unbutton time to 80 seconds or less.  Baseline: 98 seconds Goal status: IN PROGRESS  3.  Pt will verbalize understanding of ways to keep thinking skills sharp and ways to compensate for STM changes in the future.  Baseline: not yet initiated Goal status: IN PROGRESS  4.  Pt will demonstrate improved ease with feeding as evidenced by decreasing PPT#2 to 15 seconds or less.  Baseline: PPT #2: 17 secs, pt reports difficulty placing food on fork Goal status: IN PROGRESS  5.  Pt will demonstrate increased ease with dressing as evidenced by decreasing PPT#4 (don/ doff jacket) to 15 secs or less.  Baseline: PPT #4: 18 secs Goal status: IN PROGRESS    ASSESSMENT:  CLINICAL IMPRESSION: Engaging in coordination exercises with focus on large amplitude and motor control with picking up, flipping, rotating, and sliding cards with both R and L UE.  Pt continues to benefit from intermittent cues for large amplitude during fine and gross motor tasks and  cues for upright sitting posture during exercises.  PERFORMANCE DEFICITS: in functional skills including ADLs, IADLs, coordination, dexterity, proprioception, ROM, strength, Fine motor control, Gross motor control, mobility, balance, body mechanics, endurance, and UE functional use, cognitive skills including  energy/drive and memory, and psychosocial skills including environmental adaptation.   IMPAIRMENTS: are limiting patient from ADLs, IADLs, leisure, and social participation.     PLAN:  OT FREQUENCY: 2x/week  OT DURATION: 6 weeks (dates extended to allow for scheduling)  PLANNED INTERVENTIONS: 97168 OT Re-evaluation, 97535 self care/ADL training, 19147 therapeutic exercise, 97530 therapeutic activity, 97112 neuromuscular re-education, 97140 manual therapy, 97035 ultrasound, 97018 paraffin, 82956 fluidotherapy, 97010 moist heat, 97010 cryotherapy, 97032 electrical stimulation (manual), 97014 electrical stimulation unattended, 97760 Orthotics management and training, 21308 Splinting (initial encounter), [id number] Subsequent splinting/medication, passive range of motion, functional mobility training, energy conservation, patient/family education, and DME and/or AE instructions  RECOMMENDED OTHER SERVICES: PT eval already completed on 12/24/22.  CONSULTED AND AGREED WITH PLAN OF CARE: Patient  PLAN FOR NEXT SESSION:   Large amplitude movements  FM tasks - including buttoning buttons, possibly modify exercises to incorporate coin option for card tasks  Donning/doffing jacket - review strategies (e.g. "cape method")   Euriah Matlack, OT 01/26/2023, 12:38 PM

## 2023-01-26 NOTE — Therapy (Signed)
OUTPATIENT PHYSICAL THERAPY NEURO TREATMENT NOTE   Patient Name: Cody Dickerson MRN: [id number] DOB:[birth year], 72 y.o., male Today's Date: 01/26/2023   PCP: Daisy Floro, MD REFERRING PROVIDER: Tat, Octaviano Batty, DO   END OF SESSION:  PT End of Session - 01/26/23 1107     Visit Number 7    Number of Visits 17    Date for PT Re-Evaluation 02/20/23    Authorization Type Medicare/AARP    Progress Note Due on Visit 10    PT Start Time 1105    PT Stop Time 1148    PT Time Calculation (min) 43 min    Equipment Utilized During Treatment Gait belt    Activity Tolerance Patient tolerated treatment well    Behavior During Therapy WFL for tasks assessed/performed                Past Medical History:  Diagnosis Date   Balance problem    Gout    Hypercholesterolemia    Hypertension    Parkinson's disease (HCC)    Small bowel obstruction (HCC)    Past Surgical History:  Procedure Laterality Date   BOWEL BLOCKAGE  05/2019   IR ANGIO INTRA EXTRACRAN SEL COM CAROTID INNOMINATE BILAT MOD SED  10/03/2021   IR ANGIO VERTEBRAL SEL VERTEBRAL UNI R MOD SED  10/03/2021   IR RADIOLOGIST EVAL & MGMT  08/19/2021   IR US GUIDE VASC ACCESS RIGHT  10/04/2021   Patient Active Problem List   Diagnosis Date Noted   Pain due to onychomycosis of toenails of both feet 09/03/2022   Porokeratosis 06/04/2022   Parkinson's disease (HCC) 02/06/2021   SBO (small bowel obstruction) (HCC) 07/06/2019    ONSET DATE: 12/15/2022 (MD referral)  REFERRING DIAG:  R29.3 (ICD-10-CM) - Abnormal posture  R27.8 (ICD-10-CM) - Other lack of coordination  R25.1 (ICD-10-CM) - Tremor  M62.81 (ICD-10-CM) - Muscle weakness (generalized)  R29.898 (ICD-10-CM) - Other symptoms and signs involving the musculoskeletal system  R26.81 (ICD-10-CM) - Unsteadiness on feet  R26.89 (ICD-10-CM) - Other abnormalities of gait and mobility    THERAPY DIAG:  Unsteadiness on feet  Other abnormalities of gait and  mobility  Other symptoms and signs involving the nervous system  Rationale for Evaluation and Treatment: Rehabilitation  SUBJECTIVE:                                                                                                                                                                                             SUBJECTIVE STATEMENT: Did have a fall over the weekend, picking up sticks in the yard.  Just fell over.  Didn't  hurt myself at all.   Pt accompanied by: self  PERTINENT HISTORY: Left distal supraclinoid ICA aneurysm, MCI  PAIN:  Are you having pain? No  PRECAUTIONS: Fall  RED FLAGS: None   WEIGHT BEARING RESTRICTIONS: No  FALLS: Has patient fallen in last 6 months? Yes. Number of falls 3-4 -Able to get up on his own from falls, though slowly per report.    LIVING ENVIRONMENT: Lives with: lives with their spouse; pt has to help her at times Lives in: House/apartment Stairs: Yes: External: 3 steps; on right going up Has following equipment at home: None  PLOF: Independent; Spin class 1x/wk, has tried [employer]; walks several times per week; enjoys yard work  PATIENT GOALS: Pt's goals for therapy to get up better from chairs, have better balance.  OBJECTIVE:    TODAY'S TREATMENT: 01/26/2023 Activity Comments  NuStep, Level 4-5, 4 extremities x 8 minutes SPM >90-95 throughout, for aerobic warm up  Reviewed HEP- Minisquats Sidestep Backwards step Pt return demo understanding with min cues; cues for wider BOS with squats  Squats to pick up cones Simulating outdoor activities, min guard and cues for widened and slightly stagger stance BOS, to keep object directly below his BOS  Heel/toe raises 10 reps   Standing on Airex: -Heel-toe raises for a-p weightshift -marching in place -standing on beam with BUE support, then alt UE reaching -feet apart with EO no support, 30 sec, EC 2 x 30 sec   Standing on incline/decline: EO feet apart x 30 sec Forward/back  step and weightshift Stagger stance (one foot on the ground) EO 30 sec   Minisquat, holding 7# weight, 2 x 5 reps     Access Code: [email or web address] URL: [email or web address] Date: 01/22/2023 Prepared by: [facility] - Outpatient  Rehab - [facility]  Exercises - Seated Hamstring Stretch  - 1-2 x daily - 7 x weekly - 1 sets - 3 reps - 30 sec hold - Standing Gastroc Stretch at Counter  - 1-2 x daily - 7 x weekly - 1 sets - 3 reps - 30sec hold - Seated Active Hip Flexion  - 1 x daily - 5 x weekly - 2 sets - 10 reps - Sit to Stand with Armchair  - 1 x daily - 7 x weekly - 3 sets - 5 reps - Mini Squat with Counter Support  - 1 x daily - 5 x weekly - 3 sets - 10 reps - Alternating Step Backward with Support  - 1 x daily - 5 x weekly - 2 sets - 10 reps - Side Stepping with Counter Support  - 1 x daily - 5 x weekly - 2 sets - 10 reps    PATIENT EDUCATION: Education details: Reviewed HEP and tips for improved squat position for picking up objects from the yard; discussed optimal balance outside of therapy-fatigue, rest, medication Person educated: Patient Education method: Explanation, Demonstration, Tactile cues, Verbal cues, and Handouts Education comprehension: verbalized understanding and returned demonstration   ----------------------------------------------------------------- Note: Objective measures below were completed at Evaluation unless otherwise noted.  DIAGNOSTIC FINDINGS: NA per this episode  COGNITION: Overall cognitive status:  hx of MCI per MD note; WFL for session today   SENSATION: WFL  COORDINATION: Slowed RAM with toe tapping  EDEMA:  1+pitting edema BLEs MD is aware-has recommended compression stocking  MUSCLE TONE: Mild increase LLE tone  POSTURE: rounded shoulders, forward head, and posterior pelvic tilt  LOWER EXTREMITY ROM:     Active  Right  Eval Left Eval  Hip flexion    Hip extension    Hip abduction    Hip adduction    Hip  internal rotation    Hip external rotation    Knee flexion    Knee extension    Ankle dorsiflexion 10 8  Ankle plantarflexion    Ankle inversion    Ankle eversion     (Blank rows = not tested)    TRANSFERS: Assistive device utilized: None  Sit to stand: CGA and without use of BUEs, he has posterior lean, not as prominent with use of BUEs Stand to sit: CGA   GAIT: Gait pattern: step through pattern, decreased step length- Right, decreased step length- Left, shuffling, trunk flexed, and narrow BOS Distance walked: 50 ft x 2 Assistive device utilized: None Level of assistance: SBA  FUNCTIONAL TESTS:  5 times sit to stand: 18 sec with 3 of 5 episodes of posterior lean Timed up and go (TUG): 12.41 sec 10 meter walk test: 10 sec  MiniBESTest:  14/28 TUG cognitive:  23.25 esc TUG manual:  12.94 sec 360 turn R:  13 steps, 4.66 sec 360 turn L:  12 steps, 4.44 sec 35M back:  7.34 sec, small, quick, narrow BOS steps      GOALS: Goals reviewed with patient? Yes  SHORT TERM GOALS: Target date: 01/23/2023  Pt will be independent with HEP for improved balance, strength, gait. Baseline: Goal status: MET 01/22/2023  2.  Pt will improve 5x sit<>stand to less than or equal to 15 sec with no posterior lean to demonstrate improved functional strength and transfer efficiency. Baseline: 18 sec, 3 of 5 reps posterior lean>15.13 sec with UE support at chair, no posterior lean Goal status: MET 01/22/2023  3.  Pt will improve TUG cognitive score to less than or equal to 18 sec for decreased fall risk. Baseline: 23.45 sec>15.53 sec Goal status: MET 01/22/2023   LONG TERM GOALS: Target date: 02/20/2023  Pt will be independent with progression of HEP for improved balance, strength, gait. Baseline:  Goal status: IN PROGRESS  2.  Pt will improve 5x sit<>stand to less than or equal to 12.5 sec and no posterior lean to demonstrate improved functional strength and transfer  efficiency. Baseline:  Goal status: IN PROGRESS  3.  Pt will improve MiniBESTest score to at least 19/28 to decrease fall risk. Baseline: 14/24 Goal status: IN PROGRESS  4.  Pt will perform 360 turn to R and L in 8 steps or less for improved balance. Baseline: 12-13 steps Goal status: IN PROGRESS  5.  Pt will perform posterior push and release test in 2 steps or less, for improved posterior balance recovery. Baseline: multiple small steps, would fall if unaided Goal status: IN PROGRESS  6.  Pt will verbalize understanding of local Parkinson's disease community resources, including optimal fitness program. Baseline:  Goal status: IN PROGRESS  ASSESSMENT:  CLINICAL IMPRESSION: Worked on balance and functional activities today, as pt reports having fall outside over the weekend, while picking up sticks.  (He has had another fall in the past doing this activity).  Simulated squats with progression of activities, and pt needs cues throughout to widen BOS/stagger feet and avoid excess forward lean, with improved squat technique.  He also tends to reach forward to pick up object, which increases his tendency for forward lean.  Despite cues throughout activity, pt reports "it feels pretty much the same as I do at home", as he keeps feet narrow and  has excess forward lean.  Did work on compliant surfaces today, with pt having less episodes of posterior lean/retropulsion than last session.  He will continue to benefit from skilled PT towards goals for improved balance and functional mobility.  OBJECTIVE IMPAIRMENTS: Abnormal gait, decreased balance, decreased knowledge of use of DME, decreased mobility, difficulty walking, decreased ROM, decreased strength, impaired flexibility, and postural dysfunction.   ACTIVITY LIMITATIONS: standing, squatting, transfers, and locomotion level  PARTICIPATION LIMITATIONS: community activity and yard work  PERSONAL FACTORS: 3+ comorbidities: see above  are also  affecting patient's functional outcome.   REHAB POTENTIAL: Good  CLINICAL DECISION MAKING: Evolving/moderate complexity  EVALUATION COMPLEXITY: Moderate  PLAN:  PT FREQUENCY: 2x/week  PT DURATION: 8 weeks plus eval  PLANNED INTERVENTIONS: 97110-Therapeutic exercises, 97530- Therapeutic activity, 97112- Neuromuscular re-education, 97535- Self Care, 08657- Manual therapy, [phone]- Gait training, Patient/Family education, Balance training, and DME instructions  PLAN FOR NEXT SESSION: Continue to address balance, sit to stand, squats, balance strategies, compliant surface work to counteract posterior lean/LOB   Lonia Blood, PT 01/26/23 11:48 AM Phone: [phone] Fax: [phone]   [facility] at [facility] [address] [place], [place] [address] Phone # [phone] Fax # [phone]

## 2023-01-27 NOTE — Therapy (Signed)
OUTPATIENT PHYSICAL THERAPY NEURO TREATMENT NOTE   Patient Name: Cody Dickerson MRN: [id number] DOB:[birth year], 72 y.o., male Today's Date: 01/28/2023   PCP: Daisy Floro, MD REFERRING PROVIDER: Tat, Octaviano Batty, DO   END OF SESSION:  PT End of Session - 01/28/23 1145     Visit Number 8    Number of Visits 17    Date for PT Re-Evaluation 02/20/23    Authorization Type Medicare/AARP    Progress Note Due on Visit 10    PT Start Time 1102    PT Stop Time 1142    PT Time Calculation (min) 40 min    Equipment Utilized During Treatment Gait belt    Activity Tolerance Patient tolerated treatment well    Behavior During Therapy WFL for tasks assessed/performed                 Past Medical History:  Diagnosis Date   Balance problem    Gout    Hypercholesterolemia    Hypertension    Parkinson's disease (HCC)    Small bowel obstruction (HCC)    Past Surgical History:  Procedure Laterality Date   BOWEL BLOCKAGE  05/2019   IR ANGIO INTRA EXTRACRAN SEL COM CAROTID INNOMINATE BILAT MOD SED  10/03/2021   IR ANGIO VERTEBRAL SEL VERTEBRAL UNI R MOD SED  10/03/2021   IR RADIOLOGIST EVAL & MGMT  08/19/2021   IR US GUIDE VASC ACCESS RIGHT  10/04/2021   Patient Active Problem List   Diagnosis Date Noted   Pain due to onychomycosis of toenails of both feet 09/03/2022   Porokeratosis 06/04/2022   Parkinson's disease (HCC) 02/06/2021   SBO (small bowel obstruction) (HCC) 07/06/2019    ONSET DATE: 12/15/2022 (MD referral)  REFERRING DIAG:  R29.3 (ICD-10-CM) - Abnormal posture  R27.8 (ICD-10-CM) - Other lack of coordination  R25.1 (ICD-10-CM) - Tremor  M62.81 (ICD-10-CM) - Muscle weakness (generalized)  R29.898 (ICD-10-CM) - Other symptoms and signs involving the musculoskeletal system  R26.81 (ICD-10-CM) - Unsteadiness on feet  R26.89 (ICD-10-CM) - Other abnormalities of gait and mobility    THERAPY DIAG:  Other symptoms and signs involving the nervous system  Muscle  weakness (generalized)  Other lack of coordination  Unsteadiness on feet  Other abnormalities of gait and mobility  Rationale for Evaluation and Treatment: Rehabilitation  SUBJECTIVE:                                                                                                                                                                                             SUBJECTIVE STATEMENT: No more falls.   Pt accompanied by: self  PERTINENT HISTORY: Left distal supraclinoid ICA aneurysm, MCI  PAIN:  Are you having pain? No  PRECAUTIONS: Fall  RED FLAGS: None   WEIGHT BEARING RESTRICTIONS: No  FALLS: Has patient fallen in last 6 months? Yes. Number of falls 3-4 -Able to get up on his own from falls, though slowly per report.    LIVING ENVIRONMENT: Lives with: lives with their spouse; pt has to help her at times Lives in: House/apartment Stairs: Yes: External: 3 steps; on right going up Has following equipment at home: None  PLOF: Independent; Spin class 1x/wk, has tried [employer]; walks several times per week; enjoys yard work  PATIENT GOALS: Pt's goals for therapy to get up better from chairs, have better balance.  OBJECTIVE:     TODAY'S TREATMENT: 01/28/23 Activity Comments  Nustep L5 x 6 min UEs/LEs  Maintaining ~80 SPM  STS pushing off knees 12x Cues for proper set up before each rep to prevent retropulsion   backwards steps from foam A couple episodes of posterior LOB d/t L foot freezing requiring min-mod A to recover; heavy cueing to avoid anterior trunk lean which perpetuates retropulsion   staggered ant/pos wt shift Required 1 fingertip support; cues for eyes on a target in front of hip d/t flexed posture   wide BOS lateral wt shift No UE support; cues for eyes on a target in front of hip d/t flexed posture   backwards walking with wide BOS In II bars, focus on performing least # of steps to complete length of bars (best was 7)  gait training with focus on  large arm swing and large steps, then trialed single walking pole  Good effort correcting according to cueing without AD, however pt with challenge sequencing pole and requires min A        PATIENT EDUCATION: Education details: verbal review of HEP Person educated: Patient Education method: Explanation Education comprehension: verbalized understanding   Access Code: [email or web address] URL: [email or web address] Date: 01/22/2023 Prepared by: [facility] - Outpatient  Rehab - [facility]  Exercises - Seated Hamstring Stretch  - 1-2 x daily - 7 x weekly - 1 sets - 3 reps - 30 sec hold - Standing Gastroc Stretch at Counter  - 1-2 x daily - 7 x weekly - 1 sets - 3 reps - 30sec hold - Seated Active Hip Flexion  - 1 x daily - 5 x weekly - 2 sets - 10 reps - Sit to Stand with Armchair  - 1 x daily - 7 x weekly - 3 sets - 5 reps - Mini Squat with Counter Support  - 1 x daily - 5 x weekly - 3 sets - 10 reps - Alternating Step Backward with Support  - 1 x daily - 5 x weekly - 2 sets - 10 reps - Side Stepping with Counter Support  - 1 x daily - 5 x weekly - 2 sets - 10 reps     ----------------------------------------------------------------- Note: Objective measures below were completed at Evaluation unless otherwise noted.  DIAGNOSTIC FINDINGS: NA per this episode  COGNITION: Overall cognitive status:  hx of MCI per MD note; WFL for session today   SENSATION: WFL  COORDINATION: Slowed RAM with toe tapping  EDEMA:  1+pitting edema BLEs MD is aware-has recommended compression stocking  MUSCLE TONE: Mild increase LLE tone  POSTURE: rounded shoulders, forward head, and posterior pelvic tilt  LOWER EXTREMITY ROM:     Active  Right Eval Left Eval  Hip flexion  Hip extension    Hip abduction    Hip adduction    Hip internal rotation    Hip external rotation    Knee flexion    Knee extension    Ankle dorsiflexion 10 8  Ankle plantarflexion    Ankle inversion     Ankle eversion     (Blank rows = not tested)    TRANSFERS: Assistive device utilized: None  Sit to stand: CGA and without use of BUEs, he has posterior lean, not as prominent with use of BUEs Stand to sit: CGA   GAIT: Gait pattern: step through pattern, decreased step length- Right, decreased step length- Left, shuffling, trunk flexed, and narrow BOS Distance walked: 50 ft x 2 Assistive device utilized: None Level of assistance: SBA  FUNCTIONAL TESTS:  5 times sit to stand: 18 sec with 3 of 5 episodes of posterior lean Timed up and go (TUG): 12.41 sec 10 meter walk test: 10 sec  MiniBESTest:  14/28 TUG cognitive:  23.25 esc TUG manual:  12.94 sec 360 turn R:  13 steps, 4.66 sec 360 turn L:  12 steps, 4.44 sec 62M back:  7.34 sec, small, quick, narrow BOS steps      GOALS: Goals reviewed with patient? Yes  SHORT TERM GOALS: Target date: 01/23/2023  Pt will be independent with HEP for improved balance, strength, gait. Baseline: Goal status: MET 01/22/2023  2.  Pt will improve 5x sit<>stand to less than or equal to 15 sec with no posterior lean to demonstrate improved functional strength and transfer efficiency. Baseline: 18 sec, 3 of 5 reps posterior lean>15.13 sec with UE support at chair, no posterior lean Goal status: MET 01/22/2023  3.  Pt will improve TUG cognitive score to less than or equal to 18 sec for decreased fall risk. Baseline: 23.45 sec>15.53 sec Goal status: MET 01/22/2023   LONG TERM GOALS: Target date: 02/20/2023  Pt will be independent with progression of HEP for improved balance, strength, gait. Baseline:  Goal status: IN PROGRESS  2.  Pt will improve 5x sit<>stand to less than or equal to 12.5 sec and no posterior lean to demonstrate improved functional strength and transfer efficiency. Baseline:  Goal status: IN PROGRESS  3.  Pt will improve MiniBESTest score to at least 19/28 to decrease fall risk. Baseline: 14/24 Goal status: IN  PROGRESS  4.  Pt will perform 360 turn to R and L in 8 steps or less for improved balance. Baseline: 12-13 steps Goal status: IN PROGRESS  5.  Pt will perform posterior push and release test in 2 steps or less, for improved posterior balance recovery. Baseline: multiple small steps, would fall if unaided Goal status: IN PROGRESS  6.  Pt will verbalize understanding of local Parkinson's disease community resources, including optimal fitness program. Baseline:  Goal status: IN PROGRESS  ASSESSMENT:  CLINICAL IMPRESSION: Patient arrived to session without complaints. Continued working on [employer] training with cueing for proper set up and encouraging increase anterior trunk lean to combat retropulsion. Patient requires cues to reset positioning in chair for max success with each rep. Proceeded with balance training with challenges including backwards stepping, backwards walking with cueing for upright posture and long efficient steps. Gait training provided cues for arm swing, longer stride and again with taller posture. Patient tolerated session well and without complaints at end of session.   OBJECTIVE IMPAIRMENTS: Abnormal gait, decreased balance, decreased knowledge of use of DME, decreased mobility, difficulty walking, decreased ROM, decreased strength, impaired  flexibility, and postural dysfunction.   ACTIVITY LIMITATIONS: standing, squatting, transfers, and locomotion level  PARTICIPATION LIMITATIONS: community activity and yard work  PERSONAL FACTORS: 3+ comorbidities: see above  are also affecting patient's functional outcome.   REHAB POTENTIAL: Good  CLINICAL DECISION MAKING: Evolving/moderate complexity  EVALUATION COMPLEXITY: Moderate  PLAN:  PT FREQUENCY: 2x/week  PT DURATION: 8 weeks plus eval  PLANNED INTERVENTIONS: 97110-Therapeutic exercises, 97530- Therapeutic activity, 97112- Neuromuscular re-education, 97535- Self Care, 16109- Manual therapy, [phone]- Gait training,  Patient/Family education, Balance training, and DME instructions  PLAN FOR NEXT SESSION: Continue to address balance, sit to stand, squats, balance strategies, compliant surface work to counteract posterior lean/LOB   Anette Guarneri, PT, DPT 01/28/23 11:48 AM  [facility] at [facility] [address], [address] [place], [place] [address] Phone # [phone] Fax # [phone]

## 2023-01-28 ENCOUNTER — Ambulatory Visit: Payer: Medicare Other | Admitting: Occupational Therapy

## 2023-01-28 ENCOUNTER — Encounter: Payer: Self-pay | Admitting: Physical Therapy

## 2023-01-28 ENCOUNTER — Ambulatory Visit: Payer: Medicare Other | Admitting: Physical Therapy

## 2023-01-28 DIAGNOSIS — R2681 Unsteadiness on feet: Secondary | ICD-10-CM | POA: Diagnosis not present

## 2023-01-28 DIAGNOSIS — M6281 Muscle weakness (generalized): Secondary | ICD-10-CM | POA: Diagnosis not present

## 2023-01-28 DIAGNOSIS — R278 Other lack of coordination: Secondary | ICD-10-CM

## 2023-01-28 DIAGNOSIS — R2689 Other abnormalities of gait and mobility: Secondary | ICD-10-CM | POA: Diagnosis not present

## 2023-01-28 DIAGNOSIS — R29818 Other symptoms and signs involving the nervous system: Secondary | ICD-10-CM | POA: Diagnosis not present

## 2023-01-28 DIAGNOSIS — R29898 Other symptoms and signs involving the musculoskeletal system: Secondary | ICD-10-CM | POA: Diagnosis not present

## 2023-01-28 NOTE — Therapy (Signed)
OUTPATIENT OCCUPATIONAL THERAPY PARKINSON'S  Treatment Note  Patient Name: Cody Dickerson MRN: [id number] DOB:March 31, 1950, 72 y.o., male Today's Date: 01/28/2023  PCP: Daisy Floro, MD  REFERRING PROVIDER: Tat, Octaviano Batty, DO   END OF SESSION:  OT End of Session - 01/28/23 1147     Visit Number 8    Number of Visits 12    Date for OT Re-Evaluation 02/20/23    Authorization Type Medicare A&B    Progress Note Due on Visit 10    OT Start Time 1146    OT Stop Time 1230    OT Time Calculation (min) 44 min    Activity Tolerance Patient tolerated treatment well    Behavior During Therapy WFL for tasks assessed/performed                    Past Medical History:  Diagnosis Date   Balance problem    Gout    Hypercholesterolemia    Hypertension    Parkinson's disease (HCC)    Small bowel obstruction (HCC)    Past Surgical History:  Procedure Laterality Date   BOWEL BLOCKAGE  05/2019   IR ANGIO INTRA EXTRACRAN SEL COM CAROTID INNOMINATE BILAT MOD SED  10/03/2021   IR ANGIO VERTEBRAL SEL VERTEBRAL UNI R MOD SED  10/03/2021   IR RADIOLOGIST EVAL & MGMT  08/19/2021   IR US GUIDE VASC ACCESS RIGHT  10/04/2021   Patient Active Problem List   Diagnosis Date Noted   Pain due to onychomycosis of toenails of both feet 09/03/2022   Porokeratosis 06/04/2022   Parkinson's disease (HCC) 02/06/2021   SBO (small bowel obstruction) (HCC) 07/06/2019    ONSET DATE: 12/15/22 (referral date),  Pt reports Parkinson's dx approx. 3 years ago (approx. 2021)  REFERRING DIAG:   R29.3 (ICD-10-CM) - Abnormal posture  R27.8 (ICD-10-CM) - Other lack of coordination  R25.1 (ICD-10-CM) - Tremor  M62.81 (ICD-10-CM) - Muscle weakness (generalized)  R29.898 (ICD-10-CM) - Other symptoms and signs involving the musculoskeletal system  R26.81 (ICD-10-CM) - Unsteadiness on feet  R26.89 (ICD-10-CM) - Other abnormalities of gait and mobility    THERAPY DIAG:  Other symptoms and signs involving  the nervous system  Muscle weakness (generalized)  Other lack of coordination  Rationale for Evaluation and Treatment: Rehabilitation  SUBJECTIVE:   SUBJECTIVE STATEMENT: Pt reports that his afternoon meds reminder, in the app, came early the other day.  But that it went off at 4 as expected yesterday.    Pt accompanied by: self  PERTINENT HISTORY: 08/12/22 MD Progress Notes and 08/18/22 DO Progress Notes: Parkinson's disease, left distal supraclinoid ICA aneurysm (aneurysm size stable), MCI, HTN, gout, hypercholesterolemia  PRECAUTIONS: Fall, posterior lean when standing/walking   WEIGHT BEARING RESTRICTIONS: No  PAIN:  Are you having pain? No  FALLS: Has patient fallen in last 6 months? Yes. Number of falls Pt reports approx. 4 falls. Pt reports picking up sticks in yard when 2 falls occurred.  Per 12/11/22 PT Screen: Pt reports "Balance is bad". Have had several falls in the yard.   LIVING ENVIRONMENT: Lives with: lives with their spouse Lives in: House/apartment Stairs: Yes: External: 3 steps; on left going up Has following equipment at home: Grab bars  PLOF: Independent with basic ADLs, pt currently drives  PATIENT GOALS: "to do learn how to do things better."  OBJECTIVE:  Note: Objective measures were completed at Evaluation unless otherwise noted.  HAND DOMINANCE: Right  ADLs: Overall ADLs:  Transfers/ambulation related  to ADLs: Eating: ind, pt reports slower and more meticulous to get items on fork. No difficulty with cutting and serving food. Grooming: Pt reports shaving with standard razor blade is slower. Pt has electric shaver available though pt has not attempted to use it. UB Dressing: Pt reports "Not as good putting jacket on as I used to be." Pt currently using cape method to don jacket, a strategy learned in previous OT sessions. Increased difficulty with buttons: "I have more trouble with buttoning buttons placed in front of me than when a shirt is on  me." Pt reports taking extra time to work out zippers if zippers get stuck. LB Dressing: "Getting my pant legs on and off are a little slower but it's acceptable." Toileting: Ind Bathing: Ind with someone else in the home for safety Tub Shower transfers: ind Equipment: Grab bars, Sports administrator, and tub/shower  IADLs: Shopping: spouse completes grocery shopping, pt helps out with carrying heavy water Light housekeeping: pt folds laundry without difficulty Meal Prep: Ind with cutting, pt completes charcoal grilling tasks and pt reports being careful and walking slow around grill for safety. 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 Handwriting: Pt reports handwriting is slower than it used to be, and even slower if he focuses on writing legibly.   Pt wrote "[place]ales live in [place] ocean" in 18 seconds. Increased time required and approx. 66% legible.  MOBILITY STATUS: Ind without A/E. PT reported to OT that pt demo's significant posterior lean when standing and walking. Pt reports "imbalance tends to go backwards."  POSTURE COMMENTS:  rounded shoulders and forward head  ACTIVITY TOLERANCE: Activity tolerance: Pt reports increased fatigue and sleepiness during the day. Pt reports sleeping well at night. Pt reports increased fatigue may be d/t medication though pt is unsure.  FUNCTIONAL OUTCOME MEASURES: Fastening/unfastening 3 buttons: 98 seconds. OT noted mild tremors of BUE when completing task.  Physical performance test: PPT#2 (simulated eating) 17 seconds. OT noted mild tremors of RUE when completing task.  PPT#4 (donning/doffing jacket): 18 seconds. With gait belt. OT noted mild tremors of BUE during task.  COORDINATION: 01/22/23: 9 Hole Peg test: Right: 43.5 sec; Left: 41.25 sec Box and Blocks:  Right 38 blocks, Left 38 blocks  EVAL: 9 Hole Peg  test: Right: 36 sec; Left: 36 sec Box and Blocks:  Right 34 blocks, Left 41 blocks  UE ROM:    BUE shoulders flex/ABD - WFL.  BUE elbow flex/ext - WFL with v/c to fully ext. R elbow ext slower than L elbow ext.  Wrist flex/ext - WFL, v/c to fully ext.  Digits flex/ext - WFL, v/c to fully ext thumbs with slightly decreased AROM of B thumbs noted.  Note: Pt demo'd decreased speed to attain full AROM of BUE.   UE MMT:   Not tested  SENSATION: WFL Pt reports no change.  COGNITION: Overall cognitive status: Within functional limits for tasks assessed. Pt reports more difficulty remembering day of the week, taking medication at 4 PM specifically. Pt reports using calendar to help with memory.   OBSERVATIONS: Pt was pleasant and agreeable. Pt recognized difficulty with ADL/IADL tasks and described current level of functioning well. OT noted Bradykinesia and mild tremors with FM tasks . Pt donned reading glasses for handwriting task. Pt had hematoma on LUE, dorsal aspect of L hand. When asked about hematoma, pt reports "I don't know [how  I got it], maybe on screen door, but I bruise easily."  TODAY'S TREATMENT:                                                                      DATE:  01/28/23 Large amplitude: engaged in finger flicks with focus on full finger extension and opening hand prior to engaging in [facility] tasks.  OT encouraging pt to exaggerate opening throughout coordination tasks.   Pegs: engaged in small peg board pattern replication with R hand with focus on large amplitude opening of hand to facilitate increased ease when picking up pegs.  OT providing min cues initially for exaggerated hand opening with improved carryover with repetition.  Removed pegs one at a time, placing multiple into palm and then translating one by one to finger tips to place in container.  Pt with increased difficulty when removing pegs from peg board, however with good technique with translation of pegs  from palm to finger tips.   Buttons: completed in 83 seconds with increased time/effort and 79 seconds.  Pt continues to demonstrate difficulty with fastening and unfastening buttons, greater difficulty with fastening.   Coins: modifying card exercises to complete with coins.  Pt picking up and rotating coins large with focus on rotation in finger tips as well as forearm rotation, engaged in translation of coins from palm to finger tips.  Pt with good technique with use of coins this session.   01/26/23 Cards: with RUE and LUE, engaged in flipping cards one at a time off a deck, pushing cards off deck held in-hand using thumb only, and rotating card in between fingers clockwise and counter clockwise.  OT providing demonstration and cues to facilitate increased large amplitude forearm movements and hand opening with sorting cards, especially when extending finger tips during release of cards.  Pt demonstrating improved ease of dealing cards of deck with thumb on L hand over R hand.  Pt demonstrating difficulty with rotating cards between all fingers, therefore focused on use of thumb, index, and long finger only in both directions bilaterally.   Coordination: Attempted rotating pen in between fingers with pt demonstrating improved control, however still unable to rotate throughout all fingers.  OT also educated pt on modifications of card exercises to use with coins to facilitate increased carryover, as pt reports he has still yet to locate his deck of cards.  OT also educating on functional carryover of each movement above. 9 hole peg test: R: 36.85 sec and L: 41.47 sec.    01/22/23 Large amplitude: engaged in thumb opposition with focus on opening each finger into full, large extension and then finger flicks with focus on elbow extension, wrist extension, and full finger extension.   Coordination: engaged in picking up coins one at a time with focus on intentional, large amplitude when picking up  coins and placing first into container and then into coin slot.  Pt demonstrating min-mod difficulty picking up penny > quarter. 9 hole peg test: pt initially with slower measurements even after large amplitude movements, therefore completed coordination with coins and then second attempt with 9 hole peg test with 1.5 second improvement on R and no improvement on L.   Handwriting: pt writing simple sentence "[place]ales live in  [place] ocean" in 26.41 sec.  Pt demonstrating 75% legibility, however requiring increased time.  Completed in print as well, however with smaller letters, mixture of capital and lower case, and increased time of 33.9 seconds.  Completed additional sentence with focus on exaggerating sizing and use of lined paper for increased sizing and legibility.  Reiterated use of larger pen with pen grip to improve ease with writing and improve legibility.   PATIENT EDUCATION: Education details:  PD specific education for large amplitude/coordination Person educated: Patient Education method: Explanation and Handouts Education comprehension: verbalized understanding  HOME EXERCISE PROGRAM: 01/20/23 - coordination exercises (see pt instructions)  01/15/23 - bag exercises (see pt instructions)  01/08/23 - large amplitude hands (see pt instructions)  GOALS: Goals reviewed with patient? Yes  GOALS:  SHORT TERM GOALS: Target date: 01/23/23    Pt will be independent with PD specific HEP.  Baseline: not yet initiated Goal status: MET - completing large amplitude, bag exercises, and coordination HEP on 01/22/23  2.  Pt will verbalize understanding of adapted strategies to maximize safety and independence with ADLs/IADLs.  Baseline: not yet initiated Goal status: MET - 01/22/23  3.  Pt will write a simple sentence with at least 75% legibility and complete the sentence in 15 seconds or less.   Baseline: 66% legibility, 18 seconds for simple sentence Goal status: PARTIALLY MET -  required increased time (26 sec) however improved legibility to 75% on 01/22/23  4.  Pt will demonstrate improved fine motor coordination for ADLs as evidenced by completing 9 hole peg test score for BUE in 30 seconds or less.  Baseline: 9 Hole Peg test: Right: 36 sec; Left: 36 sec Goal status: NOT MET - Right: 43.5 sec; Left: 41.25 sec on 01/22/23   5.  Pt will be able to place at least 45 blocks with BUE hand with completion of Box and Blocks test.  Baseline: Right 34 blocks, Left 41 blocks Goal status: NOT MET - Completed R: 38 and L: 38 on 01/22/23    LONG TERM GOALS: Target date: 02/20/23    Pt will verbalize understanding of ways to prevent future PD related complications and PD community resources.  Baseline: not yet initiated Goal status: IN PROGRESS  2.  Pt will write a simulated/sample check with at least 85% legibility while maintaining steady speed.  Baseline: 66% legibility for simple sentence, 18 seconds for simple sentence  Goal status: IN PROGRESS  5.  Pt will demonstrate improved ease with fastening buttons as evidenced by decreasing 3 button/unbutton time to 80 seconds or less.  Baseline: 98 seconds Goal status: IN PROGRESS  3.  Pt will verbalize understanding of ways to keep thinking skills sharp and ways to compensate for STM changes in the future.  Baseline: not yet initiated Goal status: IN PROGRESS  4.  Pt will demonstrate improved ease with feeding as evidenced by decreasing PPT#2 to 15 seconds or less.  Baseline: PPT #2: 17 secs, pt reports difficulty placing food on fork Goal status: IN PROGRESS  5.  Pt will demonstrate increased ease with dressing as evidenced by decreasing PPT#4 (don/ doff jacket) to 15 secs or less.  Baseline: PPT #4: 18 secs Goal status: IN PROGRESS    ASSESSMENT:  CLINICAL IMPRESSION: Engaging in coordination exercises with focus on large amplitude and motor control with finger flicks prior to picking up each item as  well as prior to buttoning.  Pt continues to benefit from intermittent cues for large amplitude during  fine and gross motor tasks, demonstrating improvements with coordination with pegs but minimal carryover to buttons.  PERFORMANCE DEFICITS: in functional skills including ADLs, IADLs, coordination, dexterity, proprioception, ROM, strength, Fine motor control, Gross motor control, mobility, balance, body mechanics, endurance, and UE functional use, cognitive skills including energy/drive and memory, and psychosocial skills including environmental adaptation.   IMPAIRMENTS: are limiting patient from ADLs, IADLs, leisure, and social participation.     PLAN:  OT FREQUENCY: 2x/week  OT DURATION: 6 weeks (dates extended to allow for scheduling)  PLANNED INTERVENTIONS: 97168 OT Re-evaluation, 97535 self care/ADL training, 47829 therapeutic exercise, 97530 therapeutic activity, 97112 neuromuscular re-education, 97140 manual therapy, 97035 ultrasound, 97018 paraffin, 56213 fluidotherapy, 97010 moist heat, 97010 cryotherapy, 97032 electrical stimulation (manual), 97014 electrical stimulation unattended, 97760 Orthotics management and training, 08657 Splinting (initial encounter), [id number] Subsequent splinting/medication, passive range of motion, functional mobility training, energy conservation, patient/family education, and DME and/or AE instructions  RECOMMENDED OTHER SERVICES: PT eval already completed on 12/24/22.  CONSULTED AND AGREED WITH PLAN OF CARE: Patient  PLAN FOR NEXT SESSION:   Large amplitude movements  FM tasks - including buttoning buttons, possibly modify exercises to incorporate coin option for card tasks  Donning/doffing jacket - review strategies (e.g. "cape method")   Chandrea Zellman, OT 01/28/2023, 11:47 AM

## 2023-02-03 ENCOUNTER — Ambulatory Visit: Payer: Medicare Other | Admitting: Physical Therapy

## 2023-02-03 ENCOUNTER — Ambulatory Visit: Payer: Medicare Other | Attending: Family Medicine | Admitting: Occupational Therapy

## 2023-02-03 ENCOUNTER — Encounter: Payer: Self-pay | Admitting: Physical Therapy

## 2023-02-03 DIAGNOSIS — R278 Other lack of coordination: Secondary | ICD-10-CM | POA: Diagnosis not present

## 2023-02-03 DIAGNOSIS — R29818 Other symptoms and signs involving the nervous system: Secondary | ICD-10-CM | POA: Insufficient documentation

## 2023-02-03 DIAGNOSIS — R2681 Unsteadiness on feet: Secondary | ICD-10-CM

## 2023-02-03 DIAGNOSIS — M6281 Muscle weakness (generalized): Secondary | ICD-10-CM | POA: Insufficient documentation

## 2023-02-03 DIAGNOSIS — R2689 Other abnormalities of gait and mobility: Secondary | ICD-10-CM | POA: Diagnosis not present

## 2023-02-03 NOTE — Therapy (Signed)
OUTPATIENT OCCUPATIONAL THERAPY PARKINSON'S  Treatment Note  Patient Name: Cody Dickerson MRN: [id number] DOB:1950/07/07, 72 y.o., male Today's Date: 02/03/2023  PCP: Daisy Floro, MD  REFERRING PROVIDER: Tat, Octaviano Batty, DO   END OF SESSION:  OT End of Session - 02/03/23 0950     Visit Number 9    Number of Visits 12    Date for OT Re-Evaluation 02/20/23    Authorization Type Medicare A&B    Progress Note Due on Visit 10    OT Start Time [phone]    OT Stop Time 1017    OT Time Calculation (min) 40 min    Activity Tolerance Patient tolerated treatment well    Behavior During Therapy [facility] for tasks assessed/performed                     Past Medical History:  Diagnosis Date   Balance problem    Gout    Hypercholesterolemia    Hypertension    Parkinson's disease (HCC)    Small bowel obstruction (HCC)    Past Surgical History:  Procedure Laterality Date   BOWEL BLOCKAGE  05/2019   IR ANGIO INTRA EXTRACRAN SEL COM CAROTID INNOMINATE BILAT MOD SED  10/03/2021   IR ANGIO VERTEBRAL SEL VERTEBRAL UNI R MOD SED  10/03/2021   IR RADIOLOGIST EVAL & MGMT  08/19/2021   IR US GUIDE VASC ACCESS RIGHT  10/04/2021   Patient Active Problem List   Diagnosis Date Noted   Pain due to onychomycosis of toenails of both feet 09/03/2022   Porokeratosis 06/04/2022   Parkinson's disease (HCC) 02/06/2021   SBO (small bowel obstruction) (HCC) 07/06/2019    ONSET DATE: 12/15/22 (referral date),  Pt reports Parkinson's dx approx. 3 years ago (approx. 2021)  REFERRING DIAG:   R29.3 (ICD-10-CM) - Abnormal posture  R27.8 (ICD-10-CM) - Other lack of coordination  R25.1 (ICD-10-CM) - Tremor  M62.81 (ICD-10-CM) - Muscle weakness (generalized)  R29.898 (ICD-10-CM) - Other symptoms and signs involving the musculoskeletal system  R26.81 (ICD-10-CM) - Unsteadiness on feet  R26.89 (ICD-10-CM) - Other abnormalities of gait and mobility    THERAPY DIAG:  Other symptoms and signs involving  the nervous system  Muscle weakness (generalized)  Other lack of coordination  Rationale for Evaluation and Treatment: Rehabilitation  SUBJECTIVE:   SUBJECTIVE STATEMENT: Pt reports falling at his inlaws home over the weekend.  Pt reports going to sit on the edge of the bed and missed, resulting in falling and hitting the side of his face - now with bruising on his face.    Pt accompanied by: self  PERTINENT HISTORY: 08/12/22 MD Progress Notes and 08/18/22 DO Progress Notes: Parkinson's disease, left distal supraclinoid ICA aneurysm (aneurysm size stable), MCI, HTN, gout, hypercholesterolemia  PRECAUTIONS: Fall, posterior lean when standing/walking   WEIGHT BEARING RESTRICTIONS: No  PAIN:  Are you having pain? No  FALLS: Has patient fallen in last 6 months? Yes. Number of falls Pt reports approx. 4 falls. Pt reports picking up sticks in yard when 2 falls occurred.  Per 12/11/22 PT Screen: Pt reports "Balance is bad". Have had several falls in the yard.   LIVING ENVIRONMENT: Lives with: lives with their spouse Lives in: House/apartment Stairs: Yes: External: 3 steps; on left going up Has following equipment at home: Grab bars  PLOF: Independent with basic ADLs, pt currently drives  PATIENT GOALS: "to do learn how to do things better."  OBJECTIVE:  Note: Objective measures were completed  at Evaluation unless otherwise noted.  HAND DOMINANCE: Right  ADLs: Overall ADLs:  Transfers/ambulation related to ADLs: Eating: ind, pt reports slower and more meticulous to get items on fork. No difficulty with cutting and serving food. Grooming: Pt reports shaving with standard razor blade is slower. Pt has electric shaver available though pt has not attempted to use it. UB Dressing: Pt reports "Not as good putting jacket on as I used to be." Pt currently using cape method to don jacket, a strategy learned in previous OT sessions. Increased difficulty with buttons: "I have more trouble  with buttoning buttons placed in front of me than when a shirt is on me." Pt reports taking extra time to work out zippers if zippers get stuck. LB Dressing: "Getting my pant legs on and off are a little slower but it's acceptable." Toileting: Ind Bathing: Ind with someone else in the home for safety Tub Shower transfers: ind Equipment: Grab bars, Sports administrator, and tub/shower  IADLs: Shopping: spouse completes grocery shopping, pt helps out with carrying heavy water Light housekeeping: pt folds laundry without difficulty Meal Prep: Ind with cutting, pt completes charcoal grilling tasks and pt reports being careful and walking slow around grill for safety. 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 Handwriting: Pt reports handwriting is slower than it used to be, and even slower if he focuses on writing legibly.   Pt wrote "Whales live in [place] ocean" in 18 seconds. Increased time required and approx. 66% legible.  MOBILITY STATUS: Ind without A/E. PT reported to OT that pt demo's significant posterior lean when standing and walking. Pt reports "imbalance tends to go backwards."  POSTURE COMMENTS:  rounded shoulders and forward head  ACTIVITY TOLERANCE: Activity tolerance: Pt reports increased fatigue and sleepiness during the day. Pt reports sleeping well at night. Pt reports increased fatigue may be d/t medication though pt is unsure.  FUNCTIONAL OUTCOME MEASURES: Fastening/unfastening 3 buttons: 98 seconds. OT noted mild tremors of BUE when completing task.  Physical performance test: PPT#2 (simulated eating) 17 seconds. OT noted mild tremors of RUE when completing task.  PPT#4 (donning/doffing jacket): 18 seconds. With gait belt. OT noted mild tremors of BUE during task.  COORDINATION: 01/22/23: 9 Hole Peg test: Right: 43.5 sec; Left: 41.25 sec Box  and Blocks:  Right 38 blocks, Left 38 blocks  EVAL: 9 Hole Peg test: Right: 36 sec; Left: 36 sec Box and Blocks:  Right 34 blocks, Left 41 blocks  UE ROM:    BUE shoulders flex/ABD - WFL.  BUE elbow flex/ext - WFL with v/c to fully ext. R elbow ext slower than L elbow ext.  Wrist flex/ext - WFL, v/c to fully ext.  Digits flex/ext - WFL, v/c to fully ext thumbs with slightly decreased AROM of B thumbs noted.  Note: Pt demo'd decreased speed to attain full AROM of BUE.   UE MMT:   Not tested  SENSATION: WFL Pt reports no change.  COGNITION: Overall cognitive status: Within functional limits for tasks assessed. Pt reports more difficulty remembering day of the week, taking medication at 4 PM specifically. Pt reports using calendar to help with memory.   OBSERVATIONS: Pt was pleasant and agreeable. Pt recognized difficulty with ADL/IADL tasks and described current level of functioning well. OT noted Bradykinesia and mild tremors with FM tasks . Pt donned reading glasses for handwriting task. Pt had hematoma on  LUE, dorsal aspect of L hand. When asked about hematoma, pt reports "I don't know [how I got it], maybe on screen door, but I bruise easily."  TODAY'S TREATMENT:                                                                      DATE:  02/03/23 Large amplitude: engaged in thumb opposition with finger extension and finger flicks with focus on full finger extension and opening hand prior to engaging in [facility] tasks.  OT encouraging pt to exaggerate opening throughout coordination tasks.   Coins: completed with R and L hand, engaged in picking up coins, stacking and unstacking coins, and translating from palm to finger tips to place into coin slot with focus on large amplitude, purposeful movements.  Pen tricks: engaged in rotation, flip, translation, and shift with pen. OT providing demonstration and verbal cues to facilitate increased carryover of technique. Pt completing rotation  and flip with focus on amplitude and education on intrinsic hand muscles and coordination to carryover to handwriting.   Handwriting: engaged in sample handwriting with print and cursive.  Pt with improved legibilty with print, therefore OT encouraging print and taking a break between longer words to "reset" hand placement.  Reviewed use of lined paper and focus on writing large.   Pt with mild improvements with repetition and focus on pacing and size.   01/28/23 Large amplitude: engaged in finger flicks with focus on full finger extension and opening hand prior to engaging in [facility] tasks.  OT encouraging pt to exaggerate opening throughout coordination tasks.   Pegs: engaged in small peg board pattern replication with R hand with focus on large amplitude opening of hand to facilitate increased ease when picking up pegs.  OT providing min cues initially for exaggerated hand opening with improved carryover with repetition.  Removed pegs one at a time, placing multiple into palm and then translating one by one to finger tips to place in container.  Pt with increased difficulty when removing pegs from peg board, however with good technique with translation of pegs from palm to finger tips.   Buttons: completed in 83 seconds with increased time/effort and 79 seconds.  Pt continues to demonstrate difficulty with fastening and unfastening buttons, greater difficulty with fastening.   Coins: modifying card exercises to complete with coins.  Pt picking up and rotating coins large with focus on rotation in finger tips as well as forearm rotation, engaged in translation of coins from palm to finger tips.  Pt with good technique with use of coins this session.   01/26/23 Cards: with RUE and LUE, engaged in flipping cards one at a time off a deck, pushing cards off deck held in-hand using thumb only, and rotating card in between fingers clockwise and counter clockwise.  OT providing demonstration and cues to  facilitate increased large amplitude forearm movements and hand opening with sorting cards, especially when extending finger tips during release of cards.  Pt demonstrating improved ease of dealing cards of deck with thumb on L hand over R hand.  Pt demonstrating difficulty with rotating cards between all fingers, therefore focused on use of thumb, index, and long finger only in both directions bilaterally.   Coordination: Attempted rotating  pen in between fingers with pt demonstrating improved control, however still unable to rotate throughout all fingers.  OT also educated pt on modifications of card exercises to use with coins to facilitate increased carryover, as pt reports he has still yet to locate his deck of cards.  OT also educating on functional carryover of each movement above. 9 hole peg test: R: 36.85 sec and L: 41.47 sec.   PATIENT EDUCATION: Education details:  PD specific education for large amplitude/coordination Person educated: Patient Education method: Explanation and Handouts Education comprehension: verbalized understanding  HOME EXERCISE PROGRAM: 01/20/23 - coordination exercises (see pt instructions)  01/15/23 - bag exercises (see pt instructions)  01/08/23 - large amplitude hands (see pt instructions)  GOALS: Goals reviewed with patient? Yes  GOALS:  SHORT TERM GOALS: Target date: 01/23/23    Pt will be independent with PD specific HEP.  Baseline: not yet initiated Goal status: MET - completing large amplitude, bag exercises, and coordination HEP on 01/22/23  2.  Pt will verbalize understanding of adapted strategies to maximize safety and independence with ADLs/IADLs.  Baseline: not yet initiated Goal status: MET - 01/22/23  3.  Pt will write a simple sentence with at least 75% legibility and complete the sentence in 15 seconds or less.   Baseline: 66% legibility, 18 seconds for simple sentence Goal status: PARTIALLY MET - required increased time (26  sec) however improved legibility to 75% on 01/22/23  4.  Pt will demonstrate improved fine motor coordination for ADLs as evidenced by completing 9 hole peg test score for BUE in 30 seconds or less.  Baseline: 9 Hole Peg test: Right: 36 sec; Left: 36 sec Goal status: NOT MET - Right: 43.5 sec; Left: 41.25 sec on 01/22/23   5.  Pt will be able to place at least 45 blocks with BUE hand with completion of Box and Blocks test.  Baseline: Right 34 blocks, Left 41 blocks Goal status: NOT MET - Completed R: 38 and L: 38 on 01/22/23    LONG TERM GOALS: Target date: 02/20/23    Pt will verbalize understanding of ways to prevent future PD related complications and PD community resources.  Baseline: not yet initiated Goal status: IN PROGRESS  2.  Pt will write a simulated/sample check with at least 85% legibility while maintaining steady speed.  Baseline: 66% legibility for simple sentence, 18 seconds for simple sentence  Goal status: IN PROGRESS  5.  Pt will demonstrate improved ease with fastening buttons as evidenced by decreasing 3 button/unbutton time to 80 seconds or less.  Baseline: 98 seconds Goal status: IN PROGRESS  3.  Pt will verbalize understanding of ways to keep thinking skills sharp and ways to compensate for STM changes in the future.  Baseline: not yet initiated Goal status: IN PROGRESS  4.  Pt will demonstrate improved ease with feeding as evidenced by decreasing PPT#2 to 15 seconds or less.  Baseline: PPT #2: 17 secs, pt reports difficulty placing food on fork Goal status: IN PROGRESS  5.  Pt will demonstrate increased ease with dressing as evidenced by decreasing PPT#4 (don/ doff jacket) to 15 secs or less.  Baseline: PPT #4: 18 secs Goal status: IN PROGRESS    ASSESSMENT:  CLINICAL IMPRESSION: Engaging in coordination exercises with focus on large amplitude and motor control with finger flicks and pen tricks prior to and during handwriting.  Pt verbalizes  understanding of technique to increase sizing and legibility, however still with decreased carryover especially with  longer words.    PERFORMANCE DEFICITS: in functional skills including ADLs, IADLs, coordination, dexterity, proprioception, ROM, strength, Fine motor control, Gross motor control, mobility, balance, body mechanics, endurance, and UE functional use, cognitive skills including energy/drive and memory, and psychosocial skills including environmental adaptation.   IMPAIRMENTS: are limiting patient from ADLs, IADLs, leisure, and social participation.     PLAN:  OT FREQUENCY: 2x/week  OT DURATION: 6 weeks (dates extended to allow for scheduling)  PLANNED INTERVENTIONS: 97168 OT Re-evaluation, 97535 self care/ADL training, 40981 therapeutic exercise, 97530 therapeutic activity, 97112 neuromuscular re-education, 97140 manual therapy, 97035 ultrasound, 97018 paraffin, 19147 fluidotherapy, 97010 moist heat, 97010 cryotherapy, 97032 electrical stimulation (manual), 97014 electrical stimulation unattended, 97760 Orthotics management and training, 82956 Splinting (initial encounter), [id number] Subsequent splinting/medication, passive range of motion, functional mobility training, energy conservation, patient/family education, and DME and/or AE instructions  RECOMMENDED OTHER SERVICES: PT eval already completed on 12/24/22.  CONSULTED AND AGREED WITH PLAN OF CARE: Patient  PLAN FOR NEXT SESSION:   Large amplitude movements  FM tasks - including buttoning buttons, possibly modify exercises to incorporate coin option for card tasks  Donning/doffing jacket - review strategies (e.g. "cape method")   Efraim Vanallen, OT 02/03/2023, 9:50 AM

## 2023-02-03 NOTE — Therapy (Signed)
OUTPATIENT PHYSICAL THERAPY NEURO TREATMENT NOTE   Patient Name: Cody Dickerson MRN: [id number] DOB:May 19, 1950, 72 y.o., male Today's Date: 02/03/2023   PCP: Daisy Floro, MD REFERRING PROVIDER: Tat, Octaviano Batty, DO   END OF SESSION:  PT End of Session - 02/03/23 1011     Visit Number 9    Number of Visits 17    Date for PT Re-Evaluation 02/20/23    Authorization Type Medicare/AARP    Progress Note Due on Visit 10    PT Start Time 1016    PT Stop Time 1057    PT Time Calculation (min) 41 min    Equipment Utilized During Treatment Gait belt    Activity Tolerance Patient tolerated treatment well    Behavior During Therapy WFL for tasks assessed/performed                  Past Medical History:  Diagnosis Date   Balance problem    Gout    Hypercholesterolemia    Hypertension    Parkinson's disease (HCC)    Small bowel obstruction (HCC)    Past Surgical History:  Procedure Laterality Date   BOWEL BLOCKAGE  05/2019   IR ANGIO INTRA EXTRACRAN SEL COM CAROTID INNOMINATE BILAT MOD SED  10/03/2021   IR ANGIO VERTEBRAL SEL VERTEBRAL UNI R MOD SED  10/03/2021   IR RADIOLOGIST EVAL & MGMT  08/19/2021   IR US GUIDE VASC ACCESS RIGHT  10/04/2021   Patient Active Problem List   Diagnosis Date Noted   Pain due to onychomycosis of toenails of both feet 09/03/2022   Porokeratosis 06/04/2022   Parkinson's disease (HCC) 02/06/2021   SBO (small bowel obstruction) (HCC) 07/06/2019    ONSET DATE: 12/15/2022 (MD referral)  REFERRING DIAG:  R29.3 (ICD-10-CM) - Abnormal posture  R27.8 (ICD-10-CM) - Other lack of coordination  R25.1 (ICD-10-CM) - Tremor  M62.81 (ICD-10-CM) - Muscle weakness (generalized)  R29.898 (ICD-10-CM) - Other symptoms and signs involving the musculoskeletal system  R26.81 (ICD-10-CM) - Unsteadiness on feet  R26.89 (ICD-10-CM) - Other abnormalities of gait and mobility    THERAPY DIAG:  Unsteadiness on feet  Other abnormalities of gait and  mobility  Other symptoms and signs involving the nervous system  Rationale for Evaluation and Treatment: Rehabilitation  SUBJECTIVE:                                                                                                                                                                                             SUBJECTIVE STATEMENT: Was visiting family and leaned against the bed and fell to the right side.  Didn't have  anything show up until Saturday.  Now it's black and blue.  Did not pass out.    Pt accompanied by: self  PERTINENT HISTORY: Left distal supraclinoid ICA aneurysm, MCI  PAIN:  Are you having pain? No  PRECAUTIONS: Fall  RED FLAGS: None   WEIGHT BEARING RESTRICTIONS: No  FALLS: Has patient fallen in last 6 months? Yes. Number of falls 3-4 -Able to get up on his own from falls, though slowly per report.    LIVING ENVIRONMENT: Lives with: lives with their spouse; pt has to help her at times Lives in: House/apartment Stairs: Yes: External: 3 steps; on right going up Has following equipment at home: None  PLOF: Independent; Spin class 1x/wk, has tried [employer]; walks several times per week; enjoys yard work  PATIENT GOALS: Pt's goals for therapy to get up better from chairs, have better balance.  OBJECTIVE:    TODAY'S TREATMENT: 02/03/2023 Activity Comments  NuStep, Level 5, 4 extremities x 8 minutes High intensity aerobic warm up, maintaining 80 SPM with 30 sec intervals of >100   STS pushing from knees, 10 reps Cues each rep for proper technique-pt verbalizes scoot to edge, wide BOS; still has several episodes of retropulsion  Standing on [employer]: Step posterior and return to midline, 2 x 10 reps Step side and return to midline, 2 x 10 Step forward and return to midline, 2 x 10  UE support and cues for slowed pace -worked on lessening UE support on 2nd set; cues to STOP and reset posture in midline, to prevent hastening of stepping activity  Balance  perturbations on solid surface-predictable>varied, forward/back/R and L Min guard and cues for upright posture in midline; pt does take several shuffling steps with R/L and has several episodes of small posterior steps to regain balance  Gait at end of session, 80 ft, then 60 ft Cues for larger steps, longer stride with gait        PATIENT EDUCATION: Education details: Balance strategy work and purpose of balance exercises to help with ways balance strategies can help prevent falls Person educated: Patient Education method: Explanation, Demonstration, Tactile cues, and Verbal cues Education comprehension: verbalized understanding, returned demonstration, verbal cues required, and needs further education   Access Code: [email or web address] URL: [email or web address] Date: 01/22/2023 Prepared by: [facility] - Outpatient  Rehab - [facility]  Exercises - Seated Hamstring Stretch  - 1-2 x daily - 7 x weekly - 1 sets - 3 reps - 30 sec hold - Standing Gastroc Stretch at Counter  - 1-2 x daily - 7 x weekly - 1 sets - 3 reps - 30sec hold - Seated Active Hip Flexion  - 1 x daily - 5 x weekly - 2 sets - 10 reps - Sit to Stand with Armchair  - 1 x daily - 7 x weekly - 3 sets - 5 reps - Mini Squat with Counter Support  - 1 x daily - 5 x weekly - 3 sets - 10 reps - Alternating Step Backward with Support  - 1 x daily - 5 x weekly - 2 sets - 10 reps - Side Stepping with Counter Support  - 1 x daily - 5 x weekly - 2 sets - 10 reps     ----------------------------------------------------------------- Note: Objective measures below were completed at Evaluation unless otherwise noted.  DIAGNOSTIC FINDINGS: NA per this episode  COGNITION: Overall cognitive status:  hx of MCI per MD note; WFL for session today   SENSATION: WFL  COORDINATION:  Slowed RAM with toe tapping  EDEMA:  1+pitting edema BLEs MD is aware-has recommended compression stocking  MUSCLE TONE: Mild increase LLE  tone  POSTURE: rounded shoulders, forward head, and posterior pelvic tilt  LOWER EXTREMITY ROM:     Active  Right Eval Left Eval  Hip flexion    Hip extension    Hip abduction    Hip adduction    Hip internal rotation    Hip external rotation    Knee flexion    Knee extension    Ankle dorsiflexion 10 8  Ankle plantarflexion    Ankle inversion    Ankle eversion     (Blank rows = not tested)    TRANSFERS: Assistive device utilized: None  Sit to stand: CGA and without use of BUEs, he has posterior lean, not as prominent with use of BUEs Stand to sit: CGA   GAIT: Gait pattern: step through pattern, decreased step length- Right, decreased step length- Left, shuffling, trunk flexed, and narrow BOS Distance walked: 50 ft x 2 Assistive device utilized: None Level of assistance: SBA  FUNCTIONAL TESTS:  5 times sit to stand: 18 sec with 3 of 5 episodes of posterior lean Timed up and go (TUG): 12.41 sec 10 meter walk test: 10 sec  MiniBESTest:  14/28 TUG cognitive:  23.25 esc TUG manual:  12.94 sec 360 turn R:  13 steps, 4.66 sec 360 turn L:  12 steps, 4.44 sec 8M back:  7.34 sec, small, quick, narrow BOS steps      GOALS: Goals reviewed with patient? Yes  SHORT TERM GOALS: Target date: 01/23/2023  Pt will be independent with HEP for improved balance, strength, gait. Baseline: Goal status: MET 01/22/2023  2.  Pt will improve 5x sit<>stand to less than or equal to 15 sec with no posterior lean to demonstrate improved functional strength and transfer efficiency. Baseline: 18 sec, 3 of 5 reps posterior lean>15.13 sec with UE support at chair, no posterior lean Goal status: MET 01/22/2023  3.  Pt will improve TUG cognitive score to less than or equal to 18 sec for decreased fall risk. Baseline: 23.45 sec>15.53 sec Goal status: MET 01/22/2023   LONG TERM GOALS: Target date: 02/20/2023  Pt will be independent with progression of HEP for improved balance,  strength, gait. Baseline:  Goal status: IN PROGRESS  2.  Pt will improve 5x sit<>stand to less than or equal to 12.5 sec and no posterior lean to demonstrate improved functional strength and transfer efficiency. Baseline:  Goal status: IN PROGRESS  3.  Pt will improve MiniBESTest score to at least 19/28 to decrease fall risk. Baseline: 14/24 Goal status: IN PROGRESS  4.  Pt will perform 360 turn to R and L in 8 steps or less for improved balance. Baseline: 12-13 steps Goal status: IN PROGRESS  5.  Pt will perform posterior push and release test in 2 steps or less, for improved posterior balance recovery. Baseline: multiple small steps, would fall if unaided Goal status: IN PROGRESS  6.  Pt will verbalize understanding of local Parkinson's disease community resources, including optimal fitness program. Baseline:  Goal status: IN PROGRESS  ASSESSMENT:  CLINICAL IMPRESSION: Skilled PT session today initiated with intense, aerobic warm up, followed by work on sit to stand.  Pt is able to verbalize that scooting to edge and having wider BOS helps with transfers, but he does this without cues <50% of the time.  He needs cues to stop and reset between reps  of sit to stand to lessen retropulsion.  Worked on step strategy for balance on solid and compliant surface, with pt needing cues for upright posture upon return to midline (which helps with his balance), as well as cues to slow pace with stepping to avoid hastening.  Talked about how posture and slowed, larger steps may help to lessen fall risk, especially given pt's recent fall.  He will continue to benefit from skilled PT towards goals for improved functional mobility and decreased fall risk.   OBJECTIVE IMPAIRMENTS: Abnormal gait, decreased balance, decreased knowledge of use of DME, decreased mobility, difficulty walking, decreased ROM, decreased strength, impaired flexibility, and postural dysfunction.   ACTIVITY LIMITATIONS: standing,  squatting, transfers, and locomotion level  PARTICIPATION LIMITATIONS: community activity and yard work  PERSONAL FACTORS: 3+ comorbidities: see above  are also affecting patient's functional outcome.   REHAB POTENTIAL: Good  CLINICAL DECISION MAKING: Evolving/moderate complexity  EVALUATION COMPLEXITY: Moderate  PLAN:  PT FREQUENCY: 2x/week  PT DURATION: 8 weeks plus eval  PLANNED INTERVENTIONS: 97110-Therapeutic exercises, 97530- Therapeutic activity, 97112- Neuromuscular re-education, 97535- Self Care, 65784- Manual therapy, [phone]- Gait training, Patient/Family education, Balance training, and DME instructions  PLAN FOR NEXT SESSION: 10th Visit PN; Continue to address balance, sit to stand, squats, balance strategies, compliant surface work to counteract posterior lean/LOB.  *Do we need to consider trying assistive device since he is continuing to have falls and retropulsion?   Lonia Blood, PT 02/03/23 11:10 AM Phone: [phone] Fax: [phone]  [facility] at [facility] [address] [place], [address] [place], [place] [address] Phone # [phone] Fax # [phone]

## 2023-02-04 DIAGNOSIS — M1A9XX1 Chronic gout, unspecified, with tophus (tophi): Secondary | ICD-10-CM | POA: Diagnosis not present

## 2023-02-04 DIAGNOSIS — E7800 Pure hypercholesterolemia, unspecified: Secondary | ICD-10-CM | POA: Diagnosis not present

## 2023-02-04 DIAGNOSIS — I10 Essential (primary) hypertension: Secondary | ICD-10-CM | POA: Diagnosis not present

## 2023-02-04 DIAGNOSIS — Z125 Encounter for screening for malignant neoplasm of prostate: Secondary | ICD-10-CM | POA: Diagnosis not present

## 2023-02-05 ENCOUNTER — Ambulatory Visit: Payer: Medicare Other | Admitting: Physical Therapy

## 2023-02-05 ENCOUNTER — Encounter: Payer: Medicare Other | Admitting: Occupational Therapy

## 2023-02-05 ENCOUNTER — Encounter: Payer: Self-pay | Admitting: Physical Therapy

## 2023-02-05 DIAGNOSIS — R2681 Unsteadiness on feet: Secondary | ICD-10-CM | POA: Diagnosis not present

## 2023-02-05 DIAGNOSIS — R2689 Other abnormalities of gait and mobility: Secondary | ICD-10-CM | POA: Diagnosis not present

## 2023-02-05 DIAGNOSIS — R29818 Other symptoms and signs involving the nervous system: Secondary | ICD-10-CM

## 2023-02-05 DIAGNOSIS — M6281 Muscle weakness (generalized): Secondary | ICD-10-CM | POA: Diagnosis not present

## 2023-02-05 DIAGNOSIS — R278 Other lack of coordination: Secondary | ICD-10-CM | POA: Diagnosis not present

## 2023-02-05 NOTE — Therapy (Signed)
OUTPATIENT PHYSICAL THERAPY NEURO TREATMENT NOTE/PROGRESS NOTE   Patient Name: Cody Dickerson MRN: [id number] DOB:[birth year], 72 y.o., male Today's Date: 02/05/2023   PCP: Daisy Floro, MD REFERRING PROVIDER: Vladimir Faster, DO   Progress Note Reporting Period 12/24/2022 to 02/05/2023  See note below for Objective Data and Assessment of Progress/Goals.      END OF SESSION:  PT End of Session - 02/05/23 1018     Visit Number 10    Number of Visits 17    Date for PT Re-Evaluation 02/20/23    Authorization Type Medicare/AARP    Progress Note Due on Visit 10    PT Start Time 1021    PT Stop Time 1101    PT Time Calculation (min) 40 min    Equipment Utilized During Treatment Gait belt    Activity Tolerance Patient tolerated treatment well    Behavior During Therapy WFL for tasks assessed/performed                   Past Medical History:  Diagnosis Date   Balance problem    Gout    Hypercholesterolemia    Hypertension    Parkinson's disease (HCC)    Small bowel obstruction (HCC)    Past Surgical History:  Procedure Laterality Date   BOWEL BLOCKAGE  05/2019   IR ANGIO INTRA EXTRACRAN SEL COM CAROTID INNOMINATE BILAT MOD SED  10/03/2021   IR ANGIO VERTEBRAL SEL VERTEBRAL UNI R MOD SED  10/03/2021   IR RADIOLOGIST EVAL & MGMT  08/19/2021   IR US GUIDE VASC ACCESS RIGHT  10/04/2021   Patient Active Problem List   Diagnosis Date Noted   Pain due to onychomycosis of toenails of both feet 09/03/2022   Porokeratosis 06/04/2022   Parkinson's disease (HCC) 02/06/2021   SBO (small bowel obstruction) (HCC) 07/06/2019    ONSET DATE: 12/15/2022 (MD referral)  REFERRING DIAG:  R29.3 (ICD-10-CM) - Abnormal posture  R27.8 (ICD-10-CM) - Other lack of coordination  R25.1 (ICD-10-CM) - Tremor  M62.81 (ICD-10-CM) - Muscle weakness (generalized)  R29.898 (ICD-10-CM) - Other symptoms and signs involving the musculoskeletal system  R26.81 (ICD-10-CM) - Unsteadiness on  feet  R26.89 (ICD-10-CM) - Other abnormalities of gait and mobility    THERAPY DIAG:  Unsteadiness on feet  Other abnormalities of gait and mobility  Other symptoms and signs involving the nervous system  Rationale for Evaluation and Treatment: Rehabilitation  SUBJECTIVE:  SUBJECTIVE STATEMENT: PT is helping me maneuver better, but feel like I (my balance) is still deteriorating.  Would be open to trying a cane.   Pt accompanied by: self  PERTINENT HISTORY: Left distal supraclinoid ICA aneurysm, MCI  PAIN:  Are you having pain? No  PRECAUTIONS: Fall  RED FLAGS: None   WEIGHT BEARING RESTRICTIONS: No  FALLS: Has patient fallen in last 6 months? Yes. Number of falls 3-4 -Able to get up on his own from falls, though slowly per report.    LIVING ENVIRONMENT: Lives with: lives with their spouse; pt has to help her at times Lives in: House/apartment Stairs: Yes: External: 3 steps; on right going up Has following equipment at home: None  PLOF: Independent; Spin class 1x/wk, has tried [employer]; walks several times per week; enjoys yard work  PATIENT GOALS: Pt's goals for therapy to get up better from chairs, have better balance.  OBJECTIVE:    TODAY'S TREATMENT: 02/05/2023 Activity Comments  FTSTS:  31.31 sec with hands on knees Repositions to get to edge of chair between reps  Turns 360 to R 12 steps 360 to L: 12 steps   Practiced quarter turns R and L, then 360 turns with larger steps Episode of posterior lean and LOB  Gait with SPC 85 ft x 2 reps, then with small rubber tip quad cane 85 ft x 2   Gait with small rubber tip quad cane, 50 ft x 4 reps with quick stop/starts, then turns/changes of directions around furniture, supervision Cues for stride stance with stopping, better balance   Curb simulation practice with 8" aerobic step, using cane min guard, 4 reps Cues for cane placement  Stagger stance EO head turns/nods 5 reps, then EC head steady 30 sec Supervision, no posterior LOB     PATIENT EDUCATION: Education details: cane use with gait for optimal balance; wide/staggered stance with standing as a means to prevent posterior LOB Person educated: Patient Education method: Explanation, Demonstration, Tactile cues, and Verbal cues Education comprehension: verbalized understanding, returned demonstration, verbal cues required, and needs further education   Access Code: [email or web address] URL: [email or web address] Date: 01/22/2023 Prepared by: [facility] - Outpatient  Rehab - [facility]  Exercises - Seated Hamstring Stretch  - 1-2 x daily - 7 x weekly - 1 sets - 3 reps - 30 sec hold - Standing Gastroc Stretch at Counter  - 1-2 x daily - 7 x weekly - 1 sets - 3 reps - 30sec hold - Seated Active Hip Flexion  - 1 x daily - 5 x weekly - 2 sets - 10 reps - Sit to Stand with Armchair  - 1 x daily - 7 x weekly - 3 sets - 5 reps - Mini Squat with Counter Support  - 1 x daily - 5 x weekly - 3 sets - 10 reps - Alternating Step Backward with Support  - 1 x daily - 5 x weekly - 2 sets - 10 reps - Side Stepping with Counter Support  - 1 x daily - 5 x weekly - 2 sets - 10 reps     ----------------------------------------------------------------- Note: Objective measures below were completed at Evaluation unless otherwise noted.  DIAGNOSTIC FINDINGS: NA per this episode  COGNITION: Overall cognitive status:  hx of MCI per MD note; WFL for session today   SENSATION: WFL  COORDINATION: Slowed RAM with toe tapping  EDEMA:  1+pitting edema BLEs MD is aware-has recommended compression stocking  MUSCLE TONE: Mild increase LLE tone  POSTURE: rounded shoulders, forward head, and posterior pelvic tilt  LOWER EXTREMITY ROM:     Active  Right Eval Left Eval   Hip flexion    Hip extension    Hip abduction    Hip adduction    Hip internal rotation    Hip external rotation    Knee flexion    Knee extension    Ankle dorsiflexion 10 8  Ankle plantarflexion    Ankle inversion    Ankle eversion     (Blank rows = not tested)    TRANSFERS: Assistive device utilized: None  Sit to stand: CGA and without use of BUEs, he has posterior lean, not as prominent with use of BUEs Stand to sit: CGA   GAIT: Gait pattern: step through pattern, decreased step length- Right, decreased step length- Left, shuffling, trunk flexed, and narrow BOS Distance walked: 50 ft x 2 Assistive device utilized: None Level of assistance: SBA  FUNCTIONAL TESTS:  5 times sit to stand: 18 sec with 3 of 5 episodes of posterior lean Timed up and go (TUG): 12.41 sec 10 meter walk test: 10 sec  MiniBESTest:  14/28 TUG cognitive:  23.25 esc TUG manual:  12.94 sec 360 turn R:  13 steps, 4.66 sec 360 turn L:  12 steps, 4.44 sec 51M back:  7.34 sec, small, quick, narrow BOS steps      GOALS: Goals reviewed with patient? Yes  SHORT TERM GOALS: Target date: 01/23/2023  Pt will be independent with HEP for improved balance, strength, gait. Baseline: Goal status: MET 01/22/2023  2.  Pt will improve 5x sit<>stand to less than or equal to 15 sec with no posterior lean to demonstrate improved functional strength and transfer efficiency. Baseline: 18 sec, 3 of 5 reps posterior lean>15.13 sec with UE support at chair, no posterior lean Goal status: MET 01/22/2023  3.  Pt will improve TUG cognitive score to less than or equal to 18 sec for decreased fall risk. Baseline: 23.45 sec>15.53 sec Goal status: MET 01/22/2023   LONG TERM GOALS: Target date: 02/20/2023  Pt will be independent with progression of HEP for improved balance, strength, gait. Baseline:  Goal status: IN PROGRESS  2.  Pt will improve 5x sit<>stand to less than or equal to 12.5 sec and no posterior  lean to demonstrate improved functional strength and transfer efficiency. Baseline:  Goal status: IN PROGRESS  3.  Pt will improve MiniBESTest score to at least 19/28 to decrease fall risk. Baseline: 14/24 Goal status: IN PROGRESS  4.  Pt will perform 360 turn to R and L in 8 steps or less for improved balance. Baseline: 12-13 steps Goal status: IN PROGRESS  5.  Pt will perform posterior push and release test in 2 steps or less, for improved posterior balance recovery. Baseline: multiple small steps, would fall if unaided Goal status: IN PROGRESS  6.  Pt will verbalize understanding of local Parkinson's disease community resources, including optimal fitness program. Baseline:  Goal status: IN PROGRESS  ASSESSMENT:  CLINICAL IMPRESSION: 10th Visit PN:  Subjectively, pt reports he feels like transfers are better-he knows strategy for less posterior pull upon standing is to scoot to edge of chair.  He also reports he feels his balance is continueing to decline.  He is open to using cane.  Objective measures:  31.31 sec for FTSTS (with repositioning between each rep to edge of chair for better form), 12 steps for 360 turn R and L.  He continues to  have retropulsion with narrow BOS stance and when taking too large of a step.  Worked today on gait training with cane, SPC and small rubber tip quad cane.  He does well with sequencing and with improved stability with rubber tip quad cane, and he feels the improvement.  He also has improved stability when utilizing stagger stance upon stopping, versus feet in narrow Romberg type stance.  He does verbalize plans to purchase cane.  He will continue to benefit from skilled PT towards goals for improved functional mobility and decreased fall risk.  OBJECTIVE IMPAIRMENTS: Abnormal gait, decreased balance, decreased knowledge of use of DME, decreased mobility, difficulty walking, decreased ROM, decreased strength, impaired flexibility, and postural  dysfunction.   ACTIVITY LIMITATIONS: standing, squatting, transfers, and locomotion level  PARTICIPATION LIMITATIONS: community activity and yard work  PERSONAL FACTORS: 3+ comorbidities: see above  are also affecting patient's functional outcome.   REHAB POTENTIAL: Good  CLINICAL DECISION MAKING: Evolving/moderate complexity  EVALUATION COMPLEXITY: Moderate  PLAN:  PT FREQUENCY: 2x/week  PT DURATION: 8 weeks plus eval  PLANNED INTERVENTIONS: 97110-Therapeutic exercises, 97530- Therapeutic activity, [id number]- Neuromuscular re-education, 97535- Self Care, [id number]- Manual therapy, [phone]- Gait training, Patient/Family education, Balance training, and DME instructions  PLAN FOR NEXT SESSION: Gait training with cane (especially if he brings his own).  Continue to address balance, sit to stand, squats, balance strategies, compliant surface work to counteract posterior lean/LOB.    Lonia Blood, PT 02/05/23 11:06 AM Phone: [phone] Fax: [phone]  [facility] at [facility] [address] [place], [address] [place], [place] [address] Phone # [phone] Fax # [phone]

## 2023-02-10 ENCOUNTER — Encounter: Payer: Self-pay | Admitting: Physical Therapy

## 2023-02-10 ENCOUNTER — Ambulatory Visit: Payer: Medicare Other | Admitting: Physical Therapy

## 2023-02-10 DIAGNOSIS — R29818 Other symptoms and signs involving the nervous system: Secondary | ICD-10-CM | POA: Diagnosis not present

## 2023-02-10 DIAGNOSIS — R2689 Other abnormalities of gait and mobility: Secondary | ICD-10-CM

## 2023-02-10 DIAGNOSIS — R2681 Unsteadiness on feet: Secondary | ICD-10-CM | POA: Diagnosis not present

## 2023-02-10 DIAGNOSIS — R278 Other lack of coordination: Secondary | ICD-10-CM | POA: Diagnosis not present

## 2023-02-10 DIAGNOSIS — M6281 Muscle weakness (generalized): Secondary | ICD-10-CM | POA: Diagnosis not present

## 2023-02-10 NOTE — Therapy (Signed)
OUTPATIENT PHYSICAL THERAPY NEURO TREATMENT NOTE   Patient Name: Cody Dickerson MRN: [id number] DOB:23-Nov-1950, 72 y.o., male Today's Date: 02/10/2023   PCP: Daisy Floro, MD REFERRING PROVIDER: Tat, Octaviano Batty, DO        END OF SESSION:  PT End of Session - 02/10/23 1231     Visit Number 11    Number of Visits 17    Date for PT Re-Evaluation 02/20/23    Authorization Type Medicare/AARP    Progress Note Due on Visit 10    PT Start Time 1232    Equipment Utilized During Treatment Gait belt    Activity Tolerance Patient tolerated treatment well    Behavior During Therapy [facility] for tasks assessed/performed                   Past Medical History:  Diagnosis Date   Balance problem    Gout    Hypercholesterolemia    Hypertension    Parkinson's disease (HCC)    Small bowel obstruction (HCC)    Past Surgical History:  Procedure Laterality Date   BOWEL BLOCKAGE  05/2019   IR ANGIO INTRA EXTRACRAN SEL COM CAROTID INNOMINATE BILAT MOD SED  10/03/2021   IR ANGIO VERTEBRAL SEL VERTEBRAL UNI R MOD SED  10/03/2021   IR RADIOLOGIST EVAL & MGMT  08/19/2021   IR US GUIDE VASC ACCESS RIGHT  10/04/2021   Patient Active Problem List   Diagnosis Date Noted   Pain due to onychomycosis of toenails of both feet 09/03/2022   Porokeratosis 06/04/2022   Parkinson's disease (HCC) 02/06/2021   SBO (small bowel obstruction) (HCC) 07/06/2019    ONSET DATE: 12/15/2022 (MD referral)  REFERRING DIAG:  R29.3 (ICD-10-CM) - Abnormal posture  R27.8 (ICD-10-CM) - Other lack of coordination  R25.1 (ICD-10-CM) - Tremor  M62.81 (ICD-10-CM) - Muscle weakness (generalized)  R29.898 (ICD-10-CM) - Other symptoms and signs involving the musculoskeletal system  R26.81 (ICD-10-CM) - Unsteadiness on feet  R26.89 (ICD-10-CM) - Other abnormalities of gait and mobility    THERAPY DIAG:  Unsteadiness on feet  Other abnormalities of gait and mobility  Other symptoms and signs involving the  nervous system  Rationale for Evaluation and Treatment: Rehabilitation  SUBJECTIVE:                                                                                                                                                                                             SUBJECTIVE STATEMENT: No falls,no changes since last visit.  Haven't gotten cane yet.  Pt accompanied by: self  PERTINENT HISTORY: Left distal supraclinoid ICA aneurysm, MCI  PAIN:  Are you having pain? No  PRECAUTIONS: Fall  RED FLAGS: None   WEIGHT BEARING RESTRICTIONS: No  FALLS: Has patient fallen in last 6 months? Yes. Number of falls 3-4 -Able to get up on his own from falls, though slowly per report.    LIVING ENVIRONMENT: Lives with: lives with their spouse; pt has to help her at times Lives in: House/apartment Stairs: Yes: External: 3 steps; on right going up Has following equipment at home: None  PLOF: Independent; Spin class 1x/wk, has tried [employer]; walks several times per week; enjoys yard work  PATIENT GOALS: Pt's goals for therapy to get up better from chairs, have better balance.  OBJECTIVE:    TODAY'S TREATMENT: 02/10/2023 Activity Comments  Gait training with cane with small rubber quad tip cane, 300 ft, then 170 ft Cues for  longer strides  Sit to stand, 5 reps x 2 sets from  2 reps with retropulsion; cues for scoot to edge prior to initiating sit to stand  On Airex-ant/posterior weightshift x 10 -minisquats>up on toes x 10 -standing on Airex, with arms at sides; with retropulsion, pt is able to slowly use ankle>hip strategy to come up to midline stand PT maintains min guard throughout, due to tendency for posterior lean  Wall bumps, 2 x 8 Cues for sequence and for light tap to wall  Squats to pick up bean bags, then stand up/toss to target; progressed to squats to pick up multiple bean bags, then turn/carry to target Simulating yard work; pt needs cues for wider BOS, proper body  mechanics for squats and to not reach outside of BOS  Sit<>stand, 10 reps 2/10 reps with retropulsion; pt quick to start  Gait with cane with small rubber quad tip with quick stop/starts, forward/back change of direction; cues to promote stagger stance position for ease of balance transition Pt stops in stagger stance approx 50% of the time, narrow BOS feet close together 50% of the time, despite cues to work on stagger stance     PATIENT EDUCATION: Education details: cane use with gait for optimal balance; wide/staggered stance with standing as a means to prevent posterior LOB, squat technique Person educated: Patient Education method: Explanation, Demonstration, Tactile cues, and Verbal cues Education comprehension: verbalized understanding, returned demonstration, verbal cues required, and needs further education   Access Code: [email or web address] URL: [email or web address] Date: 01/22/2023 Prepared by: [facility] - Outpatient  Rehab - [facility]  Exercises - Seated Hamstring Stretch  - 1-2 x daily - 7 x weekly - 1 sets - 3 reps - 30 sec hold - Standing Gastroc Stretch at Counter  - 1-2 x daily - 7 x weekly - 1 sets - 3 reps - 30sec hold - Seated Active Hip Flexion  - 1 x daily - 5 x weekly - 2 sets - 10 reps - Sit to Stand with Armchair  - 1 x daily - 7 x weekly - 3 sets - 5 reps - Mini Squat with Counter Support  - 1 x daily - 5 x weekly - 3 sets - 10 reps - Alternating Step Backward with Support  - 1 x daily - 5 x weekly - 2 sets - 10 reps - Side Stepping with Counter Support  - 1 x daily - 5 x weekly - 2 sets - 10 reps     ----------------------------------------------------------------- Note: Objective measures below were completed at Evaluation unless otherwise noted.  DIAGNOSTIC FINDINGS: NA per this episode  COGNITION: Overall cognitive status:  hx  of MCI per MD note; [facility] for session today   SENSATION: WFL  COORDINATION: Slowed RAM with toe  tapping  EDEMA:  1+pitting edema BLEs MD is aware-has recommended compression stocking  MUSCLE TONE: Mild increase LLE tone  POSTURE: rounded shoulders, forward head, and posterior pelvic tilt  LOWER EXTREMITY ROM:     Active  Right Eval Left Eval  Hip flexion    Hip extension    Hip abduction    Hip adduction    Hip internal rotation    Hip external rotation    Knee flexion    Knee extension    Ankle dorsiflexion 10 8  Ankle plantarflexion    Ankle inversion    Ankle eversion     (Blank rows = not tested)    TRANSFERS: Assistive device utilized: None  Sit to stand: CGA and without use of BUEs, he has posterior lean, not as prominent with use of BUEs Stand to sit: CGA   GAIT: Gait pattern: step through pattern, decreased step length- Right, decreased step length- Left, shuffling, trunk flexed, and narrow BOS Distance walked: 50 ft x 2 Assistive device utilized: None Level of assistance: SBA  FUNCTIONAL TESTS:  5 times sit to stand: 18 sec with 3 of 5 episodes of posterior lean Timed up and go (TUG): 12.41 sec 10 meter walk test: 10 sec  MiniBESTest:  14/28 TUG cognitive:  23.25 esc TUG manual:  12.94 sec 360 turn R:  13 steps, 4.66 sec 360 turn L:  12 steps, 4.44 sec 46M back:  7.34 sec, small, quick, narrow BOS steps      GOALS: Goals reviewed with patient? Yes  SHORT TERM GOALS: Target date: 01/23/2023  Pt will be independent with HEP for improved balance, strength, gait. Baseline: Goal status: MET 01/22/2023  2.  Pt will improve 5x sit<>stand to less than or equal to 15 sec with no posterior lean to demonstrate improved functional strength and transfer efficiency. Baseline: 18 sec, 3 of 5 reps posterior lean>15.13 sec with UE support at chair, no posterior lean Goal status: MET 01/22/2023  3.  Pt will improve TUG cognitive score to less than or equal to 18 sec for decreased fall risk. Baseline: 23.45 sec>15.53 sec Goal status: MET  01/22/2023   LONG TERM GOALS: Target date: 02/20/2023  Pt will be independent with progression of HEP for improved balance, strength, gait. Baseline:  Goal status: IN PROGRESS  2.  Pt will improve 5x sit<>stand to less than or equal to 12.5 sec and no posterior lean to demonstrate improved functional strength and transfer efficiency. Baseline:  Goal status: IN PROGRESS  3.  Pt will improve MiniBESTest score to at least 19/28 to decrease fall risk. Baseline: 14/24 Goal status: IN PROGRESS  4.  Pt will perform 360 turn to R and L in 8 steps or less for improved balance. Baseline: 12-13 steps Goal status: IN PROGRESS  5.  Pt will perform posterior push and release test in 2 steps or less, for improved posterior balance recovery. Baseline: multiple small steps, would fall if unaided Goal status: IN PROGRESS  6.  Pt will verbalize understanding of local Parkinson's disease community resources, including optimal fitness program. Baseline:  Goal status: IN PROGRESS  ASSESSMENT:  CLINICAL IMPRESSION: Continued to work on strategies to lessen posterior lean and tendency for retropulsion.  He performs 10 reps of sit to stand from mat surface, with 2/10 reps pt having strong posterior lean/retropulsion (in 2 sets of trials).  Worked on balance recovery with ankle/hip strategy, then progressed to functional tasks of squatting and gait with change of directions and quick stop/starts.  He needs cues for proper foot position to widen/stagger stance with gait and with squats, and he reverts to his narrow stance with these activities 50% of the time.  He has not yet purchased cane, but he does report he plans to do so.  He will continue to benefit from skilled PT towards goals for improved functional mobility and decreased fall risk.  OBJECTIVE IMPAIRMENTS: Abnormal gait, decreased balance, decreased knowledge of use of DME, decreased mobility, difficulty walking, decreased ROM, decreased strength,  impaired flexibility, and postural dysfunction.   ACTIVITY LIMITATIONS: standing, squatting, transfers, and locomotion level  PARTICIPATION LIMITATIONS: community activity and yard work  PERSONAL FACTORS: 3+ comorbidities: see above  are also affecting patient's functional outcome.   REHAB POTENTIAL: Good  CLINICAL DECISION MAKING: Evolving/moderate complexity  EVALUATION COMPLEXITY: Moderate  PLAN:  PT FREQUENCY: 2x/week  PT DURATION: 8 weeks plus eval  PLANNED INTERVENTIONS: 97110-Therapeutic exercises, 97530- Therapeutic activity, [id number]- Neuromuscular re-education, 97535- Self Care, 84132- Manual therapy, [phone]- Gait training, Patient/Family education, Balance training, and DME instructions  PLAN FOR NEXT SESSION: Continue gait training with cane (especially if he brings his own).  Continue to address balance, sit to stand, squats, balance strategies, compliant surface work to counteract posterior lean/LOB.    Lonia Blood, PT 02/10/23 12:31 PM Phone: [phone] Fax: [phone]  [facility] Health Outpatient Rehab at [facility] [address] [place], [address] [place], [place] [address] Phone # [phone] Fax # [phone]

## 2023-02-12 DIAGNOSIS — R296 Repeated falls: Secondary | ICD-10-CM | POA: Diagnosis not present

## 2023-02-12 DIAGNOSIS — Z6826 Body mass index (BMI) 26.0-26.9, adult: Secondary | ICD-10-CM | POA: Diagnosis not present

## 2023-02-12 DIAGNOSIS — Z0000 Encounter for general adult medical examination without abnormal findings: Secondary | ICD-10-CM | POA: Diagnosis not present

## 2023-02-13 ENCOUNTER — Ambulatory Visit: Payer: Medicare Other | Admitting: Physical Therapy

## 2023-02-13 ENCOUNTER — Encounter: Payer: Self-pay | Admitting: Physical Therapy

## 2023-02-13 DIAGNOSIS — R2681 Unsteadiness on feet: Secondary | ICD-10-CM | POA: Diagnosis not present

## 2023-02-13 DIAGNOSIS — R29818 Other symptoms and signs involving the nervous system: Secondary | ICD-10-CM

## 2023-02-13 DIAGNOSIS — R278 Other lack of coordination: Secondary | ICD-10-CM | POA: Diagnosis not present

## 2023-02-13 DIAGNOSIS — R2689 Other abnormalities of gait and mobility: Secondary | ICD-10-CM | POA: Diagnosis not present

## 2023-02-13 DIAGNOSIS — M6281 Muscle weakness (generalized): Secondary | ICD-10-CM | POA: Diagnosis not present

## 2023-02-13 NOTE — Therapy (Signed)
OUTPATIENT PHYSICAL THERAPY NEURO TREATMENT NOTE   Patient Name: Cody Dickerson MRN: [id number] DOB:04-Aug-1950, 72 y.o., male Today's Date: 02/13/2023   PCP: Daisy Floro, MD REFERRING PROVIDER: Tat, Octaviano Batty, DO        END OF SESSION:  PT End of Session - 02/13/23 1019     Visit Number 12    Number of Visits 17    Date for PT Re-Evaluation 02/20/23    Authorization Type Medicare/AARP    Progress Note Due on Visit 10    PT Start Time 1019    PT Stop Time 1102    PT Time Calculation (min) 43 min    Equipment Utilized During Treatment Gait belt    Activity Tolerance Patient tolerated treatment well    Behavior During Therapy WFL for tasks assessed/performed                    Past Medical History:  Diagnosis Date   Balance problem    Gout    Hypercholesterolemia    Hypertension    Parkinson's disease (HCC)    Small bowel obstruction (HCC)    Past Surgical History:  Procedure Laterality Date   BOWEL BLOCKAGE  05/2019   IR ANGIO INTRA EXTRACRAN SEL COM CAROTID INNOMINATE BILAT MOD SED  10/03/2021   IR ANGIO VERTEBRAL SEL VERTEBRAL UNI R MOD SED  10/03/2021   IR RADIOLOGIST EVAL & MGMT  08/19/2021   IR US GUIDE VASC ACCESS RIGHT  10/04/2021   Patient Active Problem List   Diagnosis Date Noted   Pain due to onychomycosis of toenails of both feet 09/03/2022   Porokeratosis 06/04/2022   Parkinson's disease (HCC) 02/06/2021   SBO (small bowel obstruction) (HCC) 07/06/2019    ONSET DATE: 12/15/2022 (MD referral)  REFERRING DIAG:  R29.3 (ICD-10-CM) - Abnormal posture  R27.8 (ICD-10-CM) - Other lack of coordination  R25.1 (ICD-10-CM) - Tremor  M62.81 (ICD-10-CM) - Muscle weakness (generalized)  R29.898 (ICD-10-CM) - Other symptoms and signs involving the musculoskeletal system  R26.81 (ICD-10-CM) - Unsteadiness on feet  R26.89 (ICD-10-CM) - Other abnormalities of gait and mobility    THERAPY DIAG:  Unsteadiness on feet  Other abnormalities of  gait and mobility  Other symptoms and signs involving the nervous system  Rationale for Evaluation and Treatment: Rehabilitation  SUBJECTIVE:                                                                                                                                                                                             SUBJECTIVE STATEMENT: Nothing new.  Trying to widen stance and stagger stance,  but "old habits are hard to change"  Pt accompanied by: self  PERTINENT HISTORY: Left distal supraclinoid ICA aneurysm, MCI  PAIN:  Are you having pain? No  PRECAUTIONS: Fall  RED FLAGS: None   WEIGHT BEARING RESTRICTIONS: No  FALLS: Has patient fallen in last 6 months? Yes. Number of falls 3-4 -Able to get up on his own from falls, though slowly per report.    LIVING ENVIRONMENT: Lives with: lives with their spouse; pt has to help her at times Lives in: House/apartment Stairs: Yes: External: 3 steps; on right going up Has following equipment at home: None  PLOF: Independent; Spin class 1x/wk, has tried [employer]; walks several times per week; enjoys yard work  PATIENT GOALS: Pt's goals for therapy to get up better from chairs, have better balance.  OBJECTIVE:     TODAY'S TREATMENT: 02/13/2023 Activity Comments  Forward/back walking in parallel bars Wide BOS, using beam in middle, cues for slowed weightshift and larger step length  Gait training with cane with small rubber quad tip cane, 300 ft, then 170 ft Cues for  longer strides  Sit to stand, 5 reps from 24" mat from 18" mat surface, 2 sets x 5 0/5 reps with retropulsion  1/5 reps with retropulsion; pt able to self-correct  On Airex-ant/posterior weightshift 2 x 10-hip and ankle strategy -minisquats>up on toes x 10 -standing on Airex, with arms at sides; with retropulsion, pt is able to slowly use ankle>hip strategy to come up to midline stand -EC feet apart, 30 sec x 2 reps-strong posterior tendency PT  maintains min guard throughout, due to tendency for posterior lean  Standing on Airex: step strategy -back step and weight shift, 10 reps -forward>back step and weightshift 10 reps -balance perturbations to elicit back step strategy, with UE support Most exercises with UE support     Sidestep around hula hoop; R and L varied step length; with added UE task Cues for step/stop for wide BOS        PATIENT EDUCATION: Education details: continued to reiterate strategies to aid in better balance-wide BOS, staggered stance BOS, step/stop for slowed pace; Educated pt to ask for help for someone else to decorate Christmas tree; requested pt avoid stepping onto step ladder AND avoid reaching high or low for lights/ornaments on tree Person educated: Patient Education method: Explanation, Demonstration, Tactile cues, and Verbal cues Education comprehension: verbalized understanding, returned demonstration, verbal cues required, and needs further education   Access Code: [email or web address] URL: [email or web address] Date: 01/22/2023 Prepared by: [facility] - Outpatient  Rehab - [facility]  Exercises - Seated Hamstring Stretch  - 1-2 x daily - 7 x weekly - 1 sets - 3 reps - 30 sec hold - Standing Gastroc Stretch at Counter  - 1-2 x daily - 7 x weekly - 1 sets - 3 reps - 30sec hold - Seated Active Hip Flexion  - 1 x daily - 5 x weekly - 2 sets - 10 reps - Sit to Stand with Armchair  - 1 x daily - 7 x weekly - 3 sets - 5 reps - Mini Squat with Counter Support  - 1 x daily - 5 x weekly - 3 sets - 10 reps - Alternating Step Backward with Support  - 1 x daily - 5 x weekly - 2 sets - 10 reps - Side Stepping with Counter Support  - 1 x daily - 5 x weekly - 2 sets - 10 reps     -----------------------------------------------------------------  Note: Objective measures below were completed at Evaluation unless otherwise noted.  DIAGNOSTIC FINDINGS: NA per this episode  COGNITION: Overall  cognitive status:  hx of MCI per MD note; WFL for session today   SENSATION: WFL  COORDINATION: Slowed RAM with toe tapping  EDEMA:  1+pitting edema BLEs MD is aware-has recommended compression stocking  MUSCLE TONE: Mild increase LLE tone  POSTURE: rounded shoulders, forward head, and posterior pelvic tilt  LOWER EXTREMITY ROM:     Active  Right Eval Left Eval  Hip flexion    Hip extension    Hip abduction    Hip adduction    Hip internal rotation    Hip external rotation    Knee flexion    Knee extension    Ankle dorsiflexion 10 8  Ankle plantarflexion    Ankle inversion    Ankle eversion     (Blank rows = not tested)    TRANSFERS: Assistive device utilized: None  Sit to stand: CGA and without use of BUEs, he has posterior lean, not as prominent with use of BUEs Stand to sit: CGA   GAIT: Gait pattern: step through pattern, decreased step length- Right, decreased step length- Left, shuffling, trunk flexed, and narrow BOS Distance walked: 50 ft x 2 Assistive device utilized: None Level of assistance: SBA  FUNCTIONAL TESTS:  5 times sit to stand: 18 sec with 3 of 5 episodes of posterior lean Timed up and go (TUG): 12.41 sec 10 meter walk test: 10 sec  MiniBESTest:  14/28 TUG cognitive:  23.25 esc TUG manual:  12.94 sec 360 turn R:  13 steps, 4.66 sec 360 turn L:  12 steps, 4.44 sec 70M back:  7.34 sec, small, quick, narrow BOS steps      GOALS: Goals reviewed with patient? Yes  SHORT TERM GOALS: Target date: 01/23/2023  Pt will be independent with HEP for improved balance, strength, gait. Baseline: Goal status: MET 01/22/2023  2.  Pt will improve 5x sit<>stand to less than or equal to 15 sec with no posterior lean to demonstrate improved functional strength and transfer efficiency. Baseline: 18 sec, 3 of 5 reps posterior lean>15.13 sec with UE support at chair, no posterior lean Goal status: MET 01/22/2023  3.  Pt will improve TUG cognitive  score to less than or equal to 18 sec for decreased fall risk. Baseline: 23.45 sec>15.53 sec Goal status: MET 01/22/2023   LONG TERM GOALS: Target date: 02/20/2023  Pt will be independent with progression of HEP for improved balance, strength, gait. Baseline:  Goal status: IN PROGRESS  2.  Pt will improve 5x sit<>stand to less than or equal to 12.5 sec and no posterior lean to demonstrate improved functional strength and transfer efficiency. Baseline:  Goal status: IN PROGRESS  3.  Pt will improve MiniBESTest score to at least 19/28 to decrease fall risk. Baseline: 14/24 Goal status: IN PROGRESS  4.  Pt will perform 360 turn to R and L in 8 steps or less for improved balance. Baseline: 12-13 steps Goal status: IN PROGRESS  5.  Pt will perform posterior push and release test in 2 steps or less, for improved posterior balance recovery. Baseline: multiple small steps, would fall if unaided Goal status: IN PROGRESS  6.  Pt will verbalize understanding of local Parkinson's disease community resources, including optimal fitness program. Baseline:  Goal status: IN PROGRESS  ASSESSMENT:  CLINICAL IMPRESSION: Focus of today's session was wider BOS, slowed pace for increased weightshift and larger  step length.  With dedicated attention to task, cues, and UE support, he does a good job with balance.  With progression to UE support removed and added UE task, pt still needs cues for SLOWED pace, WIDE BOS.  Really educated pt that he is going to have to use STRATEGIES to help improve balance, as his posterior LOB tendency is very strong and balance responses still slowed.  He is able to verbalize from session to session this week some of the strategies for wider BOS, indicating some carryover; he has not had any falls this week.  He will continue to benefit from skilled PT towards goals for improved functional mobility and decreased fall risk.  OBJECTIVE IMPAIRMENTS: Abnormal gait, decreased  balance, decreased knowledge of use of DME, decreased mobility, difficulty walking, decreased ROM, decreased strength, impaired flexibility, and postural dysfunction.   ACTIVITY LIMITATIONS: standing, squatting, transfers, and locomotion level  PARTICIPATION LIMITATIONS: community activity and yard work  PERSONAL FACTORS: 3+ comorbidities: see above  are also affecting patient's functional outcome.   REHAB POTENTIAL: Good  CLINICAL DECISION MAKING: Evolving/moderate complexity  EVALUATION COMPLEXITY: Moderate  PLAN:  PT FREQUENCY: 2x/week  PT DURATION: 8 weeks plus eval  PLANNED INTERVENTIONS: 97110-Therapeutic exercises, 97530- Therapeutic activity, [id number]- Neuromuscular re-education, 97535- Self Care, 47829- Manual therapy, [phone]- Gait training, Patient/Family education, Balance training, and DME instructions  PLAN FOR NEXT SESSION: Continue gait training with cane (especially if he brings his own).  Continue to address balance, sit to stand, squats, balance strategies, compliant surface work to counteract posterior lean/LOB.  Need to check LTGs and discuss POC next week.  Lonia Blood, PT 02/13/23 11:03 AM Phone: [phone] Fax: [phone]  [facility] Health Outpatient Rehab at [facility] [address] [place], [address] [place], [place] [address] Phone # [phone] Fax # [phone]

## 2023-02-13 NOTE — Progress Notes (Unsigned)
Assessment/Plan:   1.  Parkinsons Disease  -DaTscan previously fairly equivocal, butmeets clinical criteria  -Increase carbidopa/levodopa 25/100, 2/2/2  -Continue pramipexole 0.5 mg 3 times per day.  -discussed using cane faithfully.  Discussed however, that this won't help when he bends over and picks up sticks/gumballs.  This is just not a recommended activity and will likely cause falls due to Parkinsons Disease and [place]  2.  Left distal supraclinoid ICA aneurysm  -CTA was repeated in April, 2022 and aneurysm size was stable (small, 2mm).    3.  MCI  -Neurocognitive testing done with Dr. Roseanne Reno in September, 2022 demonstrated MCI.  I don't see any progression  4.  Dizziness, improved  -don't think that this is related to Parkinsons Disease or meds, as I d/c the carbidopa/levodopa and it didn't change sx's.   -Dr. Jacinto Halim has discontinued his amlodipine due to significant orthostasis in his office.  Has improved some.   Subjective:   Cody Dickerson was seen today in follow up for Parkinsons disease.  My previous records were reviewed prior to todays visit as well as outside records available to me. Pt with wife who supplements hx.  Larey Seat trying to get into bed and he just missed the bed and fell and hit face.  This was over thanksgiving.  He also had a fall in the yard picking up sticks - leaned over and fell over and didn't get hurt.  Those were only falls.  In PT now.  He reports that PT just recommended the cane (which I recommended last visit) but he just started it 2 days ago.   he remains faithfully on his pramipexole and levodopa.  He has had no hallucinations.  He has been actively involved with physical and occupational therapies.  Those notes are reviewed.  He saw Dr. Tenny Craw on December 4.  Labs are reviewed.  Sodium was 140, potassium 4.6, chloride 105, CO2 32, BUN 22, creatinine 0.95, glucose 92.  AST was 67, ALT 13.  No notes were available from that visit.  Current prescribed  movement disorder medications: carbidopa/levodopa 25/100, 2/1/1 Pramipexole, 0.5 mg 3 times per day   Previous meds:  lexapro (took for a few days and felt it caused decreased energy)  ALLERGIES:   Allergies  Allergen Reactions   Escitalopram     felt weird and tired   Lisinopril Cough    CURRENT MEDICATIONS:  Outpatient Encounter Medications as of 02/17/2023  Medication Sig   carbidopa-levodopa (SINEMET IR) 25-100 MG tablet TAKE 2 TABLETS BY MOUTH AT 7AM, 1 TABLET AT 11AM, AND 1 TABLET AT 4PM   indomethacin (INDOCIN) 50 MG capsule Take 50 mg by mouth 3 (three) times daily as needed for moderate pain (gout pain).   pramipexole (MIRAPEX) 0.5 MG tablet TAKE 1 TABLET BY MOUTH THREE TIMES DAILY   rosuvastatin (CRESTOR) 20 MG tablet Take 20 mg by mouth daily.    ezetimibe (ZETIA) 10 MG tablet Take 1 tablet (10 mg total) by mouth daily.   [DISCONTINUED] vitamin B-12 (CYANOCOBALAMIN) 500 MCG tablet Take 500 mcg by mouth daily. (Patient not taking: Reported on 12/24/2022)   No facility-administered encounter medications on file as of 02/17/2023.    Objective:   PHYSICAL EXAMINATION:    VITALS:   Vitals:   02/17/23 1043  BP: 128/70  SpO2: 98%  Weight: 166 lb 9.6 oz (75.6 kg)  Height: 5\' 9"  (1.753 m)     GEN:  The patient appears stated age and  is in NAD. HEENT:  Normocephalic.  Some old ecchymosis (very yellow) on both sides of face/eyes but mostly gone (from 3 weeks ago).    The mucous membranes are moist. The superficial temporal arteries are without ropiness or tenderness. CV:  RRR Lungs:  CTAB Neck/HEME:  There are no carotid bruits bilaterally.  Neurological examination:  Orientation: The patient is alert and oriented x3. Cranial nerves: There is good facial symmetry with facial hypomimia. The speech is fluent and clear. Soft palate rises symmetrically and there is no tongue deviation. Hearing is intact to conversational tone. Sensation: Sensation is intact to light  touch throughout Motor: Strength is at least antigravity x4.  Movement examination: Tone: There is mild increased in tone in the bilateral UE Abnormal movements: none Coordination:  There is mild to mod decremation, with any form of RAMS, including alternating supination and pronation of the forearm, hand opening and closing, finger taps, heel taps and toe taps bilaterally Gait and Station: The patient pushes off to arise. The patient's stride length is good.  He is holding his cane.    Total time spent on today's visit was 30 minutes, including both face-to-face time and nonface-to-face time.  Time included that spent on review of records (prior notes available to me/labs/imaging if pertinent), discussing treatment and goals, answering patient's questions and coordinating care.   Cc:  Daisy Floro, MD

## 2023-02-16 ENCOUNTER — Other Ambulatory Visit: Payer: Medicare Other

## 2023-02-16 ENCOUNTER — Ambulatory Visit (HOSPITAL_COMMUNITY)
Admission: RE | Admit: 2023-02-16 | Discharge: 2023-02-16 | Disposition: A | Discharge status: Home / Self Care | Payer: Medicare Other | Source: Ambulatory Visit | Attending: Cardiology | Admitting: Cardiology

## 2023-02-16 DIAGNOSIS — I6521 Occlusion and stenosis of right carotid artery: Secondary | ICD-10-CM | POA: Diagnosis present

## 2023-02-17 ENCOUNTER — Ambulatory Visit (INDEPENDENT_AMBULATORY_CARE_PROVIDER_SITE_OTHER): Payer: Medicare Other | Admitting: Neurology

## 2023-02-17 DIAGNOSIS — G20A1 Parkinson's disease without dyskinesia, without mention of fluctuations: Secondary | ICD-10-CM | POA: Diagnosis not present

## 2023-02-17 MED ORDER — CARBIDOPA-LEVODOPA 25-100 MG PO TABS: 2.0000 | ORAL_TABLET | Freq: Three times a day (TID) | ORAL | 1 refills | Status: DC

## 2023-02-17 NOTE — Patient Instructions (Signed)
Increase carbidopa/levodopa 25/100 to 2 tablets at 7am/11am/4pm. Continue pramipexole 0.5 mg 1 tablet at 7am/11am/4pm

## 2023-02-18 ENCOUNTER — Ambulatory Visit: Payer: Medicare Other | Admitting: Physical Therapy

## 2023-02-18 ENCOUNTER — Encounter: Payer: Self-pay | Admitting: Physical Therapy

## 2023-02-18 DIAGNOSIS — R2681 Unsteadiness on feet: Secondary | ICD-10-CM

## 2023-02-18 DIAGNOSIS — R278 Other lack of coordination: Secondary | ICD-10-CM | POA: Diagnosis not present

## 2023-02-18 DIAGNOSIS — R29818 Other symptoms and signs involving the nervous system: Secondary | ICD-10-CM | POA: Diagnosis not present

## 2023-02-18 DIAGNOSIS — R2689 Other abnormalities of gait and mobility: Secondary | ICD-10-CM

## 2023-02-18 DIAGNOSIS — M6281 Muscle weakness (generalized): Secondary | ICD-10-CM | POA: Diagnosis not present

## 2023-02-18 NOTE — Progress Notes (Signed)
I will see him back in 6 months, I will order carotid in a year at that time. Mild stenosis and stable from prior studies on the right. Minimal disease on left

## 2023-02-18 NOTE — Therapy (Signed)
OUTPATIENT PHYSICAL THERAPY NEURO TREATMENT NOTE   Patient Name: Cody Dickerson MRN: [id number] DOB:1951-02-19, 72 y.o., male Today's Date: 02/18/2023   PCP: Daisy Floro, MD REFERRING PROVIDER: Tat, Octaviano Batty, DO        END OF SESSION:  PT End of Session - 02/18/23 1019     Visit Number 13    Number of Visits 17    Date for PT Re-Evaluation 02/20/23    Authorization Type Medicare/[employer]    Progress Note Due on Visit 10    PT Start Time 1019    PT Stop Time 1100    PT Time Calculation (min) 41 min    Equipment Utilized During Treatment Gait belt    Activity Tolerance Patient tolerated treatment well    Behavior During Therapy WFL for tasks assessed/performed                     Past Medical History:  Diagnosis Date   Balance problem    Gout    Hypercholesterolemia    Hypertension    Parkinson's disease (HCC)    Small bowel obstruction (HCC)    Past Surgical History:  Procedure Laterality Date   BOWEL BLOCKAGE  05/2019   IR ANGIO INTRA EXTRACRAN SEL COM CAROTID INNOMINATE BILAT MOD SED  10/03/2021   IR ANGIO VERTEBRAL SEL VERTEBRAL UNI R MOD SED  10/03/2021   IR RADIOLOGIST EVAL & MGMT  08/19/2021   IR US GUIDE VASC ACCESS RIGHT  10/04/2021   Patient Active Problem List   Diagnosis Date Noted   Pain due to onychomycosis of toenails of both feet 09/03/2022   Porokeratosis 06/04/2022   Parkinson's disease (HCC) 02/06/2021   SBO (small bowel obstruction) (HCC) 07/06/2019    ONSET DATE: 12/15/2022 (MD referral)  REFERRING DIAG:  R29.3 (ICD-10-CM) - Abnormal posture  R27.8 (ICD-10-CM) - Other lack of coordination  R25.1 (ICD-10-CM) - Tremor  M62.81 (ICD-10-CM) - Muscle weakness (generalized)  R29.898 (ICD-10-CM) - Other symptoms and signs involving the musculoskeletal system  R26.81 (ICD-10-CM) - Unsteadiness on feet  R26.89 (ICD-10-CM) - Other abnormalities of gait and mobility    THERAPY DIAG:  Unsteadiness on feet  Other abnormalities of  gait and mobility  Other symptoms and signs involving the nervous system  Rationale for Evaluation and Treatment: Rehabilitation  SUBJECTIVE:                                                                                                                                                                                             SUBJECTIVE STATEMENT: Dr. Arbutus Leas was glad to see the cane yesterday  at her visit.  Everything went well.  She did increase Carbidopa-Levodopa.  Will start that today (2 pills each dose).  No tumbles while decorating the Christmas tree.  Pt accompanied by: self  PERTINENT HISTORY: Left distal supraclinoid ICA aneurysm, MCI  PAIN:  Are you having pain? No  PRECAUTIONS: Fall  RED FLAGS: None   WEIGHT BEARING RESTRICTIONS: No  FALLS: Has patient fallen in last 6 months? Yes. Number of falls 3-4 -Able to get up on his own from falls, though slowly per report.    LIVING ENVIRONMENT: Lives with: lives with their spouse; pt has to help her at times Lives in: House/apartment Stairs: Yes: External: 3 steps; on right going up Has following equipment at home: None  PLOF: Independent; Spin class 1x/wk, has tried [employer]; walks several times per week; enjoys yard work  PATIENT GOALS: Pt's goals for therapy to get up better from chairs, have better balance.  OBJECTIVE:    TODAY'S TREATMENT: 02/18/2023 Activity Comments  Sit<>stand 5 reps hands on knees, focus on increased forward lean, 2 sets 1/10 with posterior lean, good reset between reps  FTSTS:  22.44 sec Extra time to reset between sets  Posterior push and release:  3 trials 2 of 3 trials, pt recovers in 1 small step; >3 steps on one trial  360 turn:  12-14 steps R and L Shuffling step pattern  10 M:  9.06 sec (3.62 ft/sec) 74M walk:  8.35 sec   Partial sit to stand -hands at armrests of chair with push-up for forward lean and full flat feet 2 x 5 reps  Sidestep and weightshift at parallel bars Back step  and weightshift  2 x 10 reps, worked to alternate legs  Practiced quarter turns for full 360 turns R and L Used targets, which helped with longer step to change directions  Gait training with pt's cane/small rubber quad tip cane (adjusted to correct height), 150 ft x 2 reps Stop/starts, cues for longer step length "take as few steps as possible"           PATIENT EDUCATION: Education details: Discussed POC and given pt's (slowed) progress and needing repetition and use of visual/verbal cues for optimal carryover as well as pt getting increase dose of Dopamine-will plan to recert next visit Person educated: Patient Education method: Explanation, Demonstration, Tactile cues, and Verbal cues Education comprehension: verbalized understanding, returned demonstration, verbal cues required, and needs further education   Access Code: [email or web address] URL: [email or web address] Date: 01/22/2023 Prepared by: [facility] - Outpatient  Rehab - [facility]  Exercises - Seated Hamstring Stretch  - 1-2 x daily - 7 x weekly - 1 sets - 3 reps - 30 sec hold - Standing Gastroc Stretch at Counter  - 1-2 x daily - 7 x weekly - 1 sets - 3 reps - 30sec hold - Seated Active Hip Flexion  - 1 x daily - 5 x weekly - 2 sets - 10 reps - Sit to Stand with Armchair  - 1 x daily - 7 x weekly - 3 sets - 5 reps - Mini Squat with Counter Support  - 1 x daily - 5 x weekly - 3 sets - 10 reps - Alternating Step Backward with Support  - 1 x daily - 5 x weekly - 2 sets - 10 reps - Side Stepping with Counter Support  - 1 x daily - 5 x weekly - 2 sets - 10 reps     ----------------------------------------------------------------- Note: Objective  measures below were completed at Evaluation unless otherwise noted.  DIAGNOSTIC FINDINGS: NA per this episode  COGNITION: Overall cognitive status:  hx of MCI per MD note; WFL for session today   SENSATION: WFL  COORDINATION: Slowed RAM with toe  tapping  EDEMA:  1+pitting edema BLEs MD is aware-has recommended compression stocking  MUSCLE TONE: Mild increase LLE tone  POSTURE: rounded shoulders, forward head, and posterior pelvic tilt  LOWER EXTREMITY ROM:     Active  Right Eval Left Eval  Hip flexion    Hip extension    Hip abduction    Hip adduction    Hip internal rotation    Hip external rotation    Knee flexion    Knee extension    Ankle dorsiflexion 10 8  Ankle plantarflexion    Ankle inversion    Ankle eversion     (Blank rows = not tested)    TRANSFERS: Assistive device utilized: None  Sit to stand: CGA and without use of BUEs, he has posterior lean, not as prominent with use of BUEs Stand to sit: CGA   GAIT: Gait pattern: step through pattern, decreased step length- Right, decreased step length- Left, shuffling, trunk flexed, and narrow BOS Distance walked: 50 ft x 2 Assistive device utilized: None Level of assistance: SBA  FUNCTIONAL TESTS:  5 times sit to stand: 18 sec with 3 of 5 episodes of posterior lean Timed up and go (TUG): 12.41 sec 10 meter walk test: 10 sec  MiniBESTest:  14/28 TUG cognitive:  23.25 esc TUG manual:  12.94 sec 360 turn R:  13 steps, 4.66 sec 360 turn L:  12 steps, 4.44 sec 78M back:  7.34 sec, small, quick, narrow BOS steps      GOALS: Goals reviewed with patient? Yes  SHORT TERM GOALS: Target date: 01/23/2023  Pt will be independent with HEP for improved balance, strength, gait. Baseline: Goal status: MET 01/22/2023  2.  Pt will improve 5x sit<>stand to less than or equal to 15 sec with no posterior lean to demonstrate improved functional strength and transfer efficiency. Baseline: 18 sec, 3 of 5 reps posterior lean>15.13 sec with UE support at chair, no posterior lean Goal status: MET 01/22/2023  3.  Pt will improve TUG cognitive score to less than or equal to 18 sec for decreased fall risk. Baseline: 23.45 sec>15.53 sec Goal status: MET  01/22/2023   LONG TERM GOALS: Target date: 02/20/2023  Pt will be independent with progression of HEP for improved balance, strength, gait. Baseline:  Goal status: IN PROGRESS  2.  Pt will improve 5x sit<>stand to less than or equal to 12.5 sec and no posterior lean to demonstrate improved functional strength and transfer efficiency. Baseline: 22.44 sec with initial posterior lean (hands on knees) Goal status: NOT YET MET, 02/18/2023; IN PROGRESS  3.  Pt will improve MiniBESTest score to at least 19/28 to decrease fall risk. Baseline: 14/24 Goal status: IN PROGRESS  4.  Pt will perform 360 turn to R and L in 8 steps or less for improved balance. Baseline: 12-13 steps; 12-14 steps with supervision 02/18/2023 Goal status: NOT YET MET, IN PROGRESS 02/18/2023  5.  Pt will perform posterior push and release test in 2 steps or less, for improved posterior balance recovery. Baseline: multiple small steps, would fall if unaided Goal status: IN PROGRESS  6.  Pt will verbalize understanding of local Parkinson's disease community resources, including optimal fitness program. Baseline:  Goal status: IN PROGRESS  ASSESSMENT:  CLINICAL IMPRESSION: Skilled PT session today focused initially on beginning to assess objective measures for goals.  With sit to stand, particularly FTSTS, he takes increased time from evaluation, but he is attending to resetting position between reps to help lessen posterior lean and retropulsion with sit to stand.  With turns, pt continues to have shuffling, small steps; worked on sidestep/quarter turns in parallel bars to help pt lessen the number of steps for turns.  However, pt is able to perform 3/4 of a full turn prior to taking small, shuffling steps to complete.  He does well with visual target cues for stepping to complete turn.  He brings in his new cane today, and after adjustment of height, we practiced gait with turns, starts/stops.  He is making slow and  steady progress towards safer transfers and gait; he needs continued repetition and cues for carryover outside of therapy sessions.  He did have medication change, with MD increaseing his Sinemet dose; given these factors, he will likely benefit from continued skilled PT beyond initial POC.  Will plan to recert next visit.  OBJECTIVE IMPAIRMENTS: Abnormal gait, decreased balance, decreased knowledge of use of DME, decreased mobility, difficulty walking, decreased ROM, decreased strength, impaired flexibility, and postural dysfunction.   ACTIVITY LIMITATIONS: standing, squatting, transfers, and locomotion level  PARTICIPATION LIMITATIONS: community activity and yard work  PERSONAL FACTORS: 3+ comorbidities: see above  are also affecting patient's functional outcome.   REHAB POTENTIAL: Good  CLINICAL DECISION MAKING: Evolving/moderate complexity  EVALUATION COMPLEXITY: Moderate  PLAN:  PT FREQUENCY: 2x/week  PT DURATION: 8 weeks plus eval  PLANNED INTERVENTIONS: 97110-Therapeutic exercises, 97530- Therapeutic activity, [id number]- Neuromuscular re-education, 97535- Self Care, 16109- Manual therapy, [phone]- Gait training, Patient/Family education, Balance training, and DME instructions  PLAN FOR NEXT SESSION: Check LTGs and recert.  Continue gait training with cane (especially if he brings his own).  Continue to address balance, sit to stand, squats, balance strategies, turns, compliant surface work to counteract posterior lean/LOB.  Check remaining LTGs. *Schedule more appts and plan to complete recert next visit.  Lonia Blood, PT 02/18/23 12:44 PM Phone: [phone] Fax: [phone]  [facility] at [facility] [address] [place], [address] [place], [place] [address] Phone # [phone] Fax # [phone]

## 2023-02-19 ENCOUNTER — Telehealth: Payer: Self-pay | Admitting: Cardiology

## 2023-02-19 NOTE — Telephone Encounter (Signed)
Left message to call office.  Can also schedule June follow up appointment when patient returns call

## 2023-02-19 NOTE — Telephone Encounter (Signed)
I spoke with patient and reviewed carotid doppler results with him.  Appointment has been made for patient to see Dr Jacinto Halim on August 14, 2023

## 2023-02-19 NOTE — Telephone Encounter (Signed)
Patient is returning call in regards to [facility] US Carotid results. Please advise.

## 2023-02-19 NOTE — Telephone Encounter (Signed)
-----   Message from Yates Decamp sent at 02/18/2023  6:49 PM EST ----- I will see him back in 6 months, I will order carotid in a year at that time. Mild stenosis and stable from prior studies on the right. Minimal disease on left

## 2023-02-20 ENCOUNTER — Encounter: Payer: Self-pay | Admitting: Physical Therapy

## 2023-02-20 ENCOUNTER — Ambulatory Visit: Payer: Medicare Other | Admitting: Physical Therapy

## 2023-02-20 DIAGNOSIS — R29818 Other symptoms and signs involving the nervous system: Secondary | ICD-10-CM

## 2023-02-20 DIAGNOSIS — R278 Other lack of coordination: Secondary | ICD-10-CM | POA: Diagnosis not present

## 2023-02-20 DIAGNOSIS — R2689 Other abnormalities of gait and mobility: Secondary | ICD-10-CM

## 2023-02-20 DIAGNOSIS — M6281 Muscle weakness (generalized): Secondary | ICD-10-CM | POA: Diagnosis not present

## 2023-02-20 DIAGNOSIS — R2681 Unsteadiness on feet: Secondary | ICD-10-CM

## 2023-02-20 NOTE — Therapy (Signed)
OUTPATIENT PHYSICAL THERAPY NEURO TREATMENT NOTE/RECERT   Patient Name: Cody Dickerson MRN: [id number] DOB:Apr 10, 1950, 72 y.o., male Today's Date: 02/20/2023   PCP: Daisy Floro, MD REFERRING PROVIDER: Tat, Octaviano Batty, DO        END OF SESSION:  PT End of Session - 02/20/23 1010     Visit Number 14    Number of Visits 23    Date for PT Re-Evaluation 03/27/23    Authorization Type Medicare/[employer]    Progress Note Due on Visit 20    PT Start Time 1017    PT Stop Time 1100    PT Time Calculation (min) 43 min    Equipment Utilized During Treatment Gait belt    Activity Tolerance Patient tolerated treatment well    Behavior During Therapy WFL for tasks assessed/performed                      Past Medical History:  Diagnosis Date   Balance problem    Gout    Hypercholesterolemia    Hypertension    Parkinson's disease (HCC)    Small bowel obstruction (HCC)    Past Surgical History:  Procedure Laterality Date   BOWEL BLOCKAGE  05/2019   IR ANGIO INTRA EXTRACRAN SEL COM CAROTID INNOMINATE BILAT MOD SED  10/03/2021   IR ANGIO VERTEBRAL SEL VERTEBRAL UNI R MOD SED  10/03/2021   IR RADIOLOGIST EVAL & MGMT  08/19/2021   IR US GUIDE VASC ACCESS RIGHT  10/04/2021   Patient Active Problem List   Diagnosis Date Noted   Pain due to onychomycosis of toenails of both feet 09/03/2022   Porokeratosis 06/04/2022   Parkinson's disease (HCC) 02/06/2021   SBO (small bowel obstruction) (HCC) 07/06/2019    ONSET DATE: 12/15/2022 (MD referral)  REFERRING DIAG:  R29.3 (ICD-10-CM) - Abnormal posture  R27.8 (ICD-10-CM) - Other lack of coordination  R25.1 (ICD-10-CM) - Tremor  M62.81 (ICD-10-CM) - Muscle weakness (generalized)  R29.898 (ICD-10-CM) - Other symptoms and signs involving the musculoskeletal system  R26.81 (ICD-10-CM) - Unsteadiness on feet  R26.89 (ICD-10-CM) - Other abnormalities of gait and mobility    THERAPY DIAG:  Unsteadiness on feet  Other  abnormalities of gait and mobility  Other symptoms and signs involving the nervous system  Muscle weakness (generalized)  Rationale for Evaluation and Treatment: Rehabilitation  SUBJECTIVE:                                                                                                                                                                                             SUBJECTIVE STATEMENT: Fell yesterday-talking to my  wife and fell backwards on my bottom.  Didn't hurt myself.  Pt accompanied by: self  PERTINENT HISTORY: Left distal supraclinoid ICA aneurysm, MCI  PAIN:  Are you having pain? No  PRECAUTIONS: Fall  RED FLAGS: None   WEIGHT BEARING RESTRICTIONS: No  FALLS: Has patient fallen in last 6 months? Yes. Number of falls 3-4 -Able to get up on his own from falls, though slowly per report.    LIVING ENVIRONMENT: Lives with: lives with their spouse; pt has to help her at times Lives in: House/apartment Stairs: Yes: External: 3 steps; on right going up Has following equipment at home: None  PLOF: Independent; Spin class 1x/wk, has tried [employer]; walks several times per week; enjoys yard work  PATIENT GOALS: Pt's goals for therapy to get up better from chairs, have better balance.  OBJECTIVE:    TODAY'S TREATMENT: 02/20/2023 Activity Comments  MiniBESTest: 17/28 Improved from 14/28  Posterior push and release test  2 trials-multiple steps and would fall if unaided  TUG 12.81 sec TUG cognitive: 18 sec   Quarter turns to targets: R and L 180, then 360 For last part of 360 turn, have to take one extra set of steps to additional target (target at 6:00, 8:00, 10:00) to avoid festination  Wide BOS lateral weightshifting 3 x 10 reps BUE support>reaching>lifting one leg with heavy tactile and verbal cues  Forward/back walking at single parallel bar, 5 reps Cues for backward steps as few as possible    [facility] PT Assessment - 02/20/23 1021       Standardized  Balance Assessment   Standardized Balance Assessment Mini-BESTest      Mini-BESTest   Sit To Stand Normal: Comes to stand without use of hands and stabilizes independently.    Rise to Toes Moderate: Heels up, but not full range (smaller than when holding hands), OR noticeable instability for 3 s.    Stand on one leg (left) Moderate: < 20 s   3.25, 1.22   Stand on one leg (right) Moderate: < 20 s   4.0, 5.78   Stand on one leg - lowest score 1    Compensatory Stepping Correction - Forward No step, OR would fall if not caught, OR falls spontaneously.   No step   Compensatory Stepping Correction - Backward No step, OR would fall if not caught, OR falls spontaneously.   multiple small steps   Compensatory Stepping Correction - Left Lateral Severe: Falls, or cannot step   No step   Compensatory Stepping Correction - Right Lateral Severe:  Falls, or cannot step   No step   Stepping Corredtion Lateral - lowest score 0    Stance - Feet together, eyes open, firm surface  Normal: 30s    Stance - Feet together, eyes closed, foam surface  Moderate: < 30s   13.5   Incline - Eyes Closed Normal: Stands independently 30s and aligns with gravity    Change in Gait Speed Normal: Significantly changes walkling speed without imbalance    Walk with head turns - Horizontal Moderate: performs head turns with reduction in gait speed.    Walk with pivot turns Normal: Turns with feet close FAST (< 3 steps) with good balance.    Step over obstacles Normal: Able to step over box with minimal change of gait speed and with good balance.    Timed UP & GO with Dual Task Moderate: Dual Task affects either counting OR walking (>10%) when compared to the TUG  without Dual Task.    Mini-BEST total score 17            Access Code: [email or web address] URL: [email or web address] Date: 02/20/2023 Prepared by: [facility] - Outpatient  Rehab - [facility]  Exercises - Seated Hamstring Stretch  - 1-2 x daily - 7 x  weekly - 1 sets - 3 reps - 30 sec hold - Standing Gastroc Stretch at Counter  - 1-2 x daily - 7 x weekly - 1 sets - 3 reps - 30sec hold - Seated Active Hip Flexion  - 1 x daily - 5 x weekly - 2 sets - 10 reps - Sit to Stand with Armchair  - 1 x daily - 7 x weekly - 3 sets - 5 reps - Mini Squat with Counter Support  - 1 x daily - 5 x weekly - 3 sets - 10 reps - Alternating Step Backward with Support  - 1 x daily - 5 x weekly - 2 sets - 10 reps - Side Stepping with Counter Support  - 1 x daily - 5 x weekly - 2 sets - 10 reps - Standing Quarter Turn with Counter Support  - 1-2 x daily - 7 x weekly - 1 sets - 3-5 reps    PATIENT EDUCATION: Education details: Discussed POC and given pt's (slow, but steady) progress and needing repetition and use of visual/verbal cues for optimal carryover as well as pt getting increase dose of Dopamine-will plan to recert; addition to HEP Person educated: Patient Education method: Explanation, Demonstration, Tactile cues, and Verbal cues Education comprehension: verbalized understanding, returned demonstration, verbal cues required, and needs further education      ----------------------------------------------------------------- Note: Objective measures below were completed at Evaluation unless otherwise noted.  DIAGNOSTIC FINDINGS: NA per this episode  COGNITION: Overall cognitive status:  hx of MCI per MD note; WFL for session today   SENSATION: WFL  COORDINATION: Slowed RAM with toe tapping  EDEMA:  1+pitting edema BLEs MD is aware-has recommended compression stocking  MUSCLE TONE: Mild increase LLE tone  POSTURE: rounded shoulders, forward head, and posterior pelvic tilt  LOWER EXTREMITY ROM:     Active  Right Eval Left Eval  Hip flexion    Hip extension    Hip abduction    Hip adduction    Hip internal rotation    Hip external rotation    Knee flexion    Knee extension    Ankle dorsiflexion 10 8  Ankle plantarflexion     Ankle inversion    Ankle eversion     (Blank rows = not tested)    TRANSFERS: Assistive device utilized: None  Sit to stand: CGA and without use of BUEs, he has posterior lean, not as prominent with use of BUEs Stand to sit: CGA   GAIT: Gait pattern: step through pattern, decreased step length- Right, decreased step length- Left, shuffling, trunk flexed, and narrow BOS Distance walked: 50 ft x 2 Assistive device utilized: None Level of assistance: SBA  FUNCTIONAL TESTS:  5 times sit to stand: 18 sec with 3 of 5 episodes of posterior lean Timed up and go (TUG): 12.41 sec 10 meter walk test: 10 sec  MiniBESTest:  14/28 TUG cognitive:  23.25 esc TUG manual:  12.94 sec 360 turn R:  13 steps, 4.66 sec 360 turn L:  12 steps, 4.44 sec 18M back:  7.34 sec, small, quick, narrow BOS steps   [facility] PT Assessment - 02/20/23 1021  Standardized Balance Assessment   Standardized Balance Assessment Mini-BESTest      Mini-BESTest   Sit To Stand Normal: Comes to stand without use of hands and stabilizes independently.    Rise to Toes Moderate: Heels up, but not full range (smaller than when holding hands), OR noticeable instability for 3 s.    Stand on one leg (left) Moderate: < 20 s   3.25, 1.22   Stand on one leg (right) Moderate: < 20 s   4.0, 5.78   Stand on one leg - lowest score 1    Compensatory Stepping Correction - Forward No step, OR would fall if not caught, OR falls spontaneously.   No step   Compensatory Stepping Correction - Backward No step, OR would fall if not caught, OR falls spontaneously.   multiple small steps   Compensatory Stepping Correction - Left Lateral Severe: Falls, or cannot step   No step   Compensatory Stepping Correction - Right Lateral Severe:  Falls, or cannot step   No step   Stepping Corredtion Lateral - lowest score 0    Stance - Feet together, eyes open, firm surface  Normal: 30s    Stance - Feet together, eyes closed, foam surface  Moderate: <  30s   13.5   Incline - Eyes Closed Normal: Stands independently 30s and aligns with gravity    Change in Gait Speed Normal: Significantly changes walkling speed without imbalance    Walk with head turns - Horizontal Moderate: performs head turns with reduction in gait speed.    Walk with pivot turns Normal: Turns with feet close FAST (< 3 steps) with good balance.    Step over obstacles Normal: Able to step over box with minimal change of gait speed and with good balance.    Timed UP & GO with Dual Task Moderate: Dual Task affects either counting OR walking (>10%) when compared to the TUG without Dual Task.    Mini-BEST total score 17               GOALS: Goals reviewed with patient? Yes  SHORT TERM GOALS: Target date: 01/23/2023  Pt will be independent with HEP for improved balance, strength, gait. Baseline: Goal status: MET 01/22/2023  2.  Pt will improve 5x sit<>stand to less than or equal to 15 sec with no posterior lean to demonstrate improved functional strength and transfer efficiency. Baseline: 18 sec, 3 of 5 reps posterior lean>15.13 sec with UE support at chair, no posterior lean Goal status: MET 01/22/2023  3.  Pt will improve TUG cognitive score to less than or equal to 18 sec for decreased fall risk. Baseline: 23.45 sec>15.53 sec Goal status: MET 01/22/2023   LONG TERM GOALS: Target date: 02/20/2023>03/27/2023  Pt will be independent with progression of HEP for improved balance, strength, gait. Baseline: update given 02/20/2023 Goal status: IN PROGRESS, 12/202024  2.  Pt will improve 5x sit<>stand to less than or equal to 12.5 sec and no posterior lean to demonstrate improved functional strength and transfer efficiency. Baseline: 22.44 sec with initial posterior lean (hands on knees) Goal status: NOT YET MET, 02/18/2023; IN PROGRESS  3.  Pt will improve MiniBESTest score to at least 19/28 to decrease fall risk. Baseline: 14/28>17/28 02/2023 Goal status: IN  PROGRESS, 02/20/2023  4.  Pt will perform 360 turn to R and L in 8 steps or less for improved balance. Baseline: 12-13 steps; 12-14 steps with supervision 02/18/2023 Goal status: NOT YET MET, IN PROGRESS  02/18/2023  5.  Pt will perform posterior push and release test in 2 steps or less, for improved posterior balance recovery. Baseline: multiple small steps, would fall if unaided 02/20/2023 Goal status: IN PROGRESS, 02/20/2023  6.  Pt will verbalize understanding of local Parkinson's disease community resources, including optimal fitness program. Baseline:  Goal status: IN PROGRESS  ASSESSMENT:  CLINICAL IMPRESSION: Assessed pt's remaining LTGs this visit.  He has improved MiniBESTest score from 14/28 to 17/28, just not to goal level.  He continues to have postural instability and decreased ability to regain balance with step strategy in backwards direction.  He does report a fall yesterday, where he was standing and just fell backwards.   Pt is making incremental progress towards his goals (see below from last visit note), and he is continueing to have significant posterior LOB.  His goals are all in progress and remain appropriate and we will continue to address his balance, fall prevention and balance recovery to lessen fall risk and improve overall functional mobility.  From 02/18/2023:   With sit to stand, particularly FTSTS, he takes increased time from evaluation, but he is attending to resetting position between reps to help lessen posterior lean and retropulsion with sit to stand.  With turns, pt continues to have shuffling, small steps; worked on sidestep/quarter turns in parallel bars to help pt lessen the number of steps for turns.  However, pt is able to perform 3/4 of a full turn prior to taking small, shuffling steps to complete.  He does well with visual target cues for stepping to complete turn.  He brings in his new cane today, and after adjustment of height, we practiced gait  with turns, starts/stops.  He is making slow and steady progress towards safer transfers and gait; he needs continued repetition and cues for carryover outside of therapy sessions.  He did have medication change, with MD increaseing his Sinemet dose; given these factors, he will likely benefit from continued skilled PT beyond initial POC.  Will plan to recert next visit.  OBJECTIVE IMPAIRMENTS: Abnormal gait, decreased balance, decreased knowledge of use of DME, decreased mobility, difficulty walking, decreased ROM, decreased strength, impaired flexibility, and postural dysfunction.   ACTIVITY LIMITATIONS: standing, squatting, transfers, and locomotion level  PARTICIPATION LIMITATIONS: community activity and yard work  PERSONAL FACTORS: 3+ comorbidities: see above  are also affecting patient's functional outcome.   REHAB POTENTIAL: Good  CLINICAL DECISION MAKING: Evolving/moderate complexity  EVALUATION COMPLEXITY: Moderate  PLAN:  PT FREQUENCY: 2x/week  PT DURATION: 8 weeks plus eval  PLANNED INTERVENTIONS: 97110-Therapeutic exercises, 97530- Therapeutic activity, [id number]- Neuromuscular re-education, 97535- Self Care, [id number]- Manual therapy, [phone]- Gait training, Patient/Family education, Balance training, and DME instructions  PLAN FOR NEXT SESSION: Review quarter turns added to HEP.   Continue to address balance/retropulsion, turns, backwards and side balance recovery.  Work on sit to stand, squats, balance strategies, turns, compliant surface work to counteract posterior lean/LOB.     Lonia Blood, PT 02/20/23 11:59 AM Phone: [phone] Fax: [phone]  [facility] at [facility] [address] [place], [address] [place], [place] [address] Phone # [phone] Fax # [phone]

## 2023-03-06 ENCOUNTER — Ambulatory Visit (INDEPENDENT_AMBULATORY_CARE_PROVIDER_SITE_OTHER): Payer: Medicare Other | Admitting: Podiatry

## 2023-03-06 ENCOUNTER — Encounter: Payer: Self-pay | Admitting: Podiatry

## 2023-03-06 VITALS — Ht 69.0 in | Wt 166.0 lb

## 2023-03-06 DIAGNOSIS — B351 Tinea unguium: Secondary | ICD-10-CM | POA: Diagnosis not present

## 2023-03-06 DIAGNOSIS — M79675 Pain in left toe(s): Secondary | ICD-10-CM | POA: Diagnosis not present

## 2023-03-06 DIAGNOSIS — M79674 Pain in right toe(s): Secondary | ICD-10-CM | POA: Diagnosis not present

## 2023-03-06 NOTE — Progress Notes (Signed)
This patient presents to the office for treatment of painful callus under the ball of his right foot.  He says this is painful walking and wearing his shoes.  He was referred to the office by his doctor since he has Parkinsons. He also requests his big toenails be trimmed. He presents to the office for evaluation and treatment.  Vascular  Dorsalis pedis and posterior tibial pulses are palpable  B/L.  Capillary return  WNL.  Temperature gradient is  WNL.  Skin turgor  WNL  Sensorium  Senn Weinstein monofilament wire  WNL. Normal tactile sensation.  Nail Exam  Patient has thick disfigured big toenails B/L.  Thick hallux nails  B/L.  Orthopedic  Exam  Muscle tone and muscle strength  WNL.  No limitations of motion feet  B/L.  No crepitus or joint effusion noted.  Foot type is unremarkable and digits show no abnormalities.  DJD 1st MPJ  right foot.  Skin  No open lesions.  Normal skin texture and turgor.  Onychomycosis  B/L   Debride  nails with nail nippers and dremel tool.   RTC 3 months.   Helane Gunther DPM

## 2023-03-09 ENCOUNTER — Encounter: Payer: Self-pay | Admitting: Physical Therapy

## 2023-03-09 ENCOUNTER — Ambulatory Visit: Payer: Medicare Other | Attending: Family Medicine | Admitting: Physical Therapy

## 2023-03-09 DIAGNOSIS — R2689 Other abnormalities of gait and mobility: Secondary | ICD-10-CM | POA: Diagnosis not present

## 2023-03-09 DIAGNOSIS — R293 Abnormal posture: Secondary | ICD-10-CM | POA: Diagnosis not present

## 2023-03-09 DIAGNOSIS — R2681 Unsteadiness on feet: Secondary | ICD-10-CM | POA: Diagnosis not present

## 2023-03-09 DIAGNOSIS — R278 Other lack of coordination: Secondary | ICD-10-CM | POA: Insufficient documentation

## 2023-03-09 DIAGNOSIS — R41840 Attention and concentration deficit: Secondary | ICD-10-CM | POA: Diagnosis not present

## 2023-03-09 DIAGNOSIS — R29818 Other symptoms and signs involving the nervous system: Secondary | ICD-10-CM | POA: Insufficient documentation

## 2023-03-09 DIAGNOSIS — M6281 Muscle weakness (generalized): Secondary | ICD-10-CM | POA: Insufficient documentation

## 2023-03-09 NOTE — Therapy (Signed)
 OUTPATIENT PHYSICAL THERAPY NEURO TREATMENT NOTE   Patient Name: Cody Dickerson MRN: [id number] DOB:05-10-1950, 73 y.o., male Today's Date: 03/09/2023   PCP: Okey Carlin Redbird, MD REFERRING PROVIDER: Tat, Asberry RAMAN, DO        END OF SESSION:  PT End of Session - 03/09/23 1007     Visit Number 15    Number of Visits 23    Date for PT Re-Evaluation 03/27/23    Authorization Type Medicare/AARP    Progress Note Due on Visit 20    PT Start Time 1010    PT Stop Time 1100    PT Time Calculation (min) 50 min    Equipment Utilized During Treatment Gait belt    Activity Tolerance Patient tolerated treatment well    Behavior During Therapy WFL for tasks assessed/performed                       Past Medical History:  Diagnosis Date   Balance problem    Gout    Hypercholesterolemia    Hypertension    Parkinson's disease (HCC)    Small bowel obstruction (HCC)    Past Surgical History:  Procedure Laterality Date   BOWEL BLOCKAGE  05/2019   IR ANGIO INTRA EXTRACRAN SEL COM CAROTID INNOMINATE BILAT MOD SED  10/03/2021   IR ANGIO VERTEBRAL SEL VERTEBRAL UNI R MOD SED  10/03/2021   IR RADIOLOGIST EVAL & MGMT  08/19/2021   IR US  GUIDE VASC ACCESS RIGHT  10/04/2021   Patient Active Problem List   Diagnosis Date Noted   Pain due to onychomycosis of toenails of both feet 09/03/2022   Porokeratosis 06/04/2022   Parkinson's disease (HCC) 02/06/2021   SBO (small bowel obstruction) (HCC) 07/06/2019    ONSET DATE: 12/15/2022 (MD referral)  REFERRING DIAG:  R29.3 (ICD-10-CM) - Abnormal posture  R27.8 (ICD-10-CM) - Other lack of coordination  R25.1 (ICD-10-CM) - Tremor  M62.81 (ICD-10-CM) - Muscle weakness (generalized)  R29.898 (ICD-10-CM) - Other symptoms and signs involving the musculoskeletal system  R26.81 (ICD-10-CM) - Unsteadiness on feet  R26.89 (ICD-10-CM) - Other abnormalities of gait and mobility    THERAPY DIAG:  Unsteadiness on feet  Other abnormalities  of gait and mobility  Muscle weakness (generalized)  Other symptoms and signs involving the nervous system  Rationale for Evaluation and Treatment: Rehabilitation  SUBJECTIVE:                                                                                                                                                                                             SUBJECTIVE STATEMENT: Had a good  holiday and good new year so far.  Nothing new other than stumbling.  Did a fall getting the Christmas tree out of the house, onto the tree, so didn't get hurt.  Had a fall at father-in-law's home on uneven floor; did not get hurt.  Pt accompanied by: self  PERTINENT HISTORY: Left distal supraclinoid ICA aneurysm, MCI  PAIN:  Are you having pain? No  PRECAUTIONS: Fall  RED FLAGS: None   WEIGHT BEARING RESTRICTIONS: No  FALLS: Has patient fallen in last 6 months? Yes. Number of falls 3-4 -Able to get up on his own from falls, though slowly per report.    LIVING ENVIRONMENT: Lives with: lives with their spouse; pt has to help her at times Lives in: House/apartment Stairs: Yes: External: 3 steps; on right going up Has following equipment at home: None  PLOF: Independent; Spin class 1x/wk, has tried [employer]; walks several times per week; enjoys yard work  PATIENT GOALS: Pt's goals for therapy to get up better from chairs, have better balance.  OBJECTIVE:    TODAY'S TREATMENT: 03/09/2023 Activity Comments  Gait x 2:30, 85 ft x 3 With cane, cues for slowed, longer strides for gait sequence  Gait 85 ft x 2 with cane Cues to slow pace and longer strides; pt reports he does not feel the difference (from faster, shorter steps to start); about 50% accurate with cane placement  Gait 85 ft x 2, then gait around furniture, large figure 8 turns with cane   Reviewed quarter turn HEP Needs reminder cues; has not practiced at home  Rockerboard ant/posterior direction with posterior step off board,  to recover; then lateral direction with ankle/hip strategy UE support and fwd flexed posture; without UE support, ankle strategy fails and he reaches for UE support  Resisted forward/back gait Resisted sidestepping R and L Min assist and cues to increase step length            PATIENT EDUCATION: Education details: walking program for home for cane practice with SLOWED, LONGER STRIDES Person educated: Patient Education method: Explanation and Demonstration Education comprehension: verbalized understanding and returned demonstration   Access Code: [email or web address] URL: [email or web address] Date: 02/20/2023 Prepared by: [facility] - Outpatient  Rehab - [facility]  Exercises - Seated Hamstring Stretch  - 1-2 x daily - 7 x weekly - 1 sets - 3 reps - 30 sec hold - Standing Gastroc Stretch at Counter  - 1-2 x daily - 7 x weekly - 1 sets - 3 reps - 30sec hold - Seated Active Hip Flexion  - 1 x daily - 5 x weekly - 2 sets - 10 reps - Sit to Stand with Armchair  - 1 x daily - 7 x weekly - 3 sets - 5 reps - Mini Squat with Counter Support  - 1 x daily - 5 x weekly - 3 sets - 10 reps - Alternating Step Backward with Support  - 1 x daily - 5 x weekly - 2 sets - 10 reps - Side Stepping with Counter Support  - 1 x daily - 5 x weekly - 2 sets - 10 reps - Standing Quarter Turn with Counter Support  - 1-2 x daily - 7 x weekly - 1 sets - 3-5 reps    PATIENT EDUCATION: Education details: Discussed POC and given pt's (slow, but steady) progress and needing repetition and use of visual/verbal cues for optimal carryover as well as pt getting increase dose of Dopamine-will plan to recert; addition  to HEP Person educated: Patient Education method: Explanation, Demonstration, Tactile cues, and Verbal cues Education comprehension: verbalized understanding, returned demonstration, verbal cues required, and needs further  education      ----------------------------------------------------------------- Note: Objective measures below were completed at Evaluation unless otherwise noted.  DIAGNOSTIC FINDINGS: NA per this episode  COGNITION: Overall cognitive status:  hx of MCI per MD note; WFL for session today   SENSATION: WFL  COORDINATION: Slowed RAM with toe tapping  EDEMA:  1+pitting edema BLEs MD is aware-has recommended compression stocking  MUSCLE TONE: Mild increase LLE tone  POSTURE: rounded shoulders, forward head, and posterior pelvic tilt  LOWER EXTREMITY ROM:     Active  Right Eval Left Eval  Hip flexion    Hip extension    Hip abduction    Hip adduction    Hip internal rotation    Hip external rotation    Knee flexion    Knee extension    Ankle dorsiflexion 10 8  Ankle plantarflexion    Ankle inversion    Ankle eversion     (Blank rows = not tested)    TRANSFERS: Assistive device utilized: None  Sit to stand: CGA and without use of BUEs, he has posterior lean, not as prominent with use of BUEs Stand to sit: CGA   GAIT: Gait pattern: step through pattern, decreased step length- Right, decreased step length- Left, shuffling, trunk flexed, and narrow BOS Distance walked: 50 ft x 2 Assistive device utilized: None Level of assistance: SBA  FUNCTIONAL TESTS:  5 times sit to stand: 18 sec with 3 of 5 episodes of posterior lean Timed up and go (TUG): 12.41 sec 10 meter walk test: 10 sec  MiniBESTest:  14/28 TUG cognitive:  23.25 esc TUG manual:  12.94 sec 360 turn R:  13 steps, 4.66 sec 360 turn L:  12 steps, 4.44 sec 15M back:  7.34 sec, small, quick, narrow BOS steps       GOALS: Goals reviewed with patient? Yes  SHORT TERM GOALS: Target date: 01/23/2023  Pt will be independent with HEP for improved balance, strength, gait. Baseline: Goal status: MET 01/22/2023  2.  Pt will improve 5x sit<>stand to less than or equal to 15 sec with no posterior  lean to demonstrate improved functional strength and transfer efficiency. Baseline: 18 sec, 3 of 5 reps posterior lean>15.13 sec with UE support at chair, no posterior lean Goal status: MET 01/22/2023  3.  Pt will improve TUG cognitive score to less than or equal to 18 sec for decreased fall risk. Baseline: 23.45 sec>15.53 sec Goal status: MET 01/22/2023   LONG TERM GOALS: Target date: 02/20/2023>03/27/2023  Pt will be independent with progression of HEP for improved balance, strength, gait. Baseline: update given 02/20/2023 Goal status: IN PROGRESS, 12/202024  2.  Pt will improve 5x sit<>stand to less than or equal to 12.5 sec and no posterior lean to demonstrate improved functional strength and transfer efficiency. Baseline: 22.44 sec with initial posterior lean (hands on knees) Goal status: NOT YET MET, 02/18/2023; IN PROGRESS  3.  Pt will improve MiniBESTest score to at least 19/28 to decrease fall risk. Baseline: 14/28>17/28 02/2023 Goal status: IN PROGRESS, 02/20/2023  4.  Pt will perform 360 turn to R and L in 8 steps or less for improved balance. Baseline: 12-13 steps; 12-14 steps with supervision 02/18/2023 Goal status: NOT YET MET, IN PROGRESS 02/18/2023  5.  Pt will perform posterior push and release test in 2 steps or  less, for improved posterior balance recovery. Baseline: multiple small steps, would fall if unaided 02/20/2023 Goal status: IN PROGRESS, 02/20/2023  6.  Pt will verbalize understanding of local Parkinson's disease community resources, including optimal fitness program. Baseline:  Goal status: IN PROGRESS  ASSESSMENT:  CLINICAL IMPRESSION: Pt presents today with reports of 2 falls and additional stumbles since last visit several weeks ago. Skilled PT session focused on gait training with cane (per pt report), balance strategy, and resisted walking. Pt needs min/mod assist on rockerboard and with resisted gait to use appropriate step length/step strategy  to avoid LOB.  Focused on education for slowed, steady, longer stride length with cane, with pt able to appropriately sequence cane 50% of the time.  Pt will continue to benefit from skilled PT towards goals for improved functional mobility and decreased fall risk.    OBJECTIVE IMPAIRMENTS: Abnormal gait, decreased balance, decreased knowledge of use of DME, decreased mobility, difficulty walking, decreased ROM, decreased strength, impaired flexibility, and postural dysfunction.   ACTIVITY LIMITATIONS: standing, squatting, transfers, and locomotion level  PARTICIPATION LIMITATIONS: community activity and yard work  PERSONAL FACTORS: 3+ comorbidities: see above  are also affecting patient's functional outcome.   REHAB POTENTIAL: Good  CLINICAL DECISION MAKING: Evolving/moderate complexity  EVALUATION COMPLEXITY: Moderate  PLAN:  PT FREQUENCY: 2x/week  PT DURATION: 8 weeks plus eval  PLANNED INTERVENTIONS: 97110-Therapeutic exercises, 97530- Therapeutic activity, 97112- Neuromuscular re-education, 97535- Self Care, 02859- Manual therapy, [phone]- Gait training, Patient/Family education, Balance training, and DME instructions  PLAN FOR NEXT SESSION: Review quarter turns (more like 10:00/12:00/2:00); gait training with turns and cane.  Continue to address balance/retropulsion, turns, backwards and side balance recovery.  Work on sit to stand, squats, balance strategies, compliant surface work to counteract posterior lean/LOB.     Greig Anon, PT 03/09/23 11:08 AM Phone: [phone] Fax: [phone]  [facility] at [facility] [address] [place], [address] [place], [place] [address] Phone # [phone] Fax # [phone]

## 2023-03-11 DIAGNOSIS — H524 Presbyopia: Secondary | ICD-10-CM | POA: Diagnosis not present

## 2023-03-11 DIAGNOSIS — H43813 Vitreous degeneration, bilateral: Secondary | ICD-10-CM | POA: Diagnosis not present

## 2023-03-11 DIAGNOSIS — H5203 Hypermetropia, bilateral: Secondary | ICD-10-CM | POA: Diagnosis not present

## 2023-03-11 DIAGNOSIS — H25813 Combined forms of age-related cataract, bilateral: Secondary | ICD-10-CM | POA: Diagnosis not present

## 2023-03-12 ENCOUNTER — Ambulatory Visit: Payer: Medicare Other | Admitting: Physical Therapy

## 2023-03-12 NOTE — Therapy (Signed)
 OUTPATIENT PHYSICAL THERAPY NEURO TREATMENT NOTE   Patient Name: Cody Dickerson MRN: [id number] DOB:Jul 04, 1950, 73 y.o., male Today's Date: 03/16/2023   PCP: Okey Carlin Redbird, MD REFERRING PROVIDER: Tat, Asberry RAMAN, DO        END OF SESSION:  PT End of Session - 03/16/23 1057     Visit Number 16    Number of Visits 23    Date for PT Re-Evaluation 03/27/23    Authorization Type Medicare/AARP    Progress Note Due on Visit 20    PT Start Time 1015    PT Stop Time 1058    PT Time Calculation (min) 43 min    Equipment Utilized During Treatment Gait belt    Activity Tolerance Patient tolerated treatment well    Behavior During Therapy WFL for tasks assessed/performed                        Past Medical History:  Diagnosis Date   Balance problem    Gout    Hypercholesterolemia    Hypertension    Parkinson's disease (HCC)    Small bowel obstruction (HCC)    Past Surgical History:  Procedure Laterality Date   BOWEL BLOCKAGE  05/2019   IR ANGIO INTRA EXTRACRAN SEL COM CAROTID INNOMINATE BILAT MOD SED  10/03/2021   IR ANGIO VERTEBRAL SEL VERTEBRAL UNI R MOD SED  10/03/2021   IR RADIOLOGIST EVAL & MGMT  08/19/2021   IR US  GUIDE VASC ACCESS RIGHT  10/04/2021   Patient Active Problem List   Diagnosis Date Noted   Pain due to onychomycosis of toenails of both feet 09/03/2022   Porokeratosis 06/04/2022   Parkinson's disease (HCC) 02/06/2021   SBO (small bowel obstruction) (HCC) 07/06/2019    ONSET DATE: 12/15/2022 (MD referral)  REFERRING DIAG:  R29.3 (ICD-10-CM) - Abnormal posture  R27.8 (ICD-10-CM) - Other lack of coordination  R25.1 (ICD-10-CM) - Tremor  M62.81 (ICD-10-CM) - Muscle weakness (generalized)  R29.898 (ICD-10-CM) - Other symptoms and signs involving the musculoskeletal system  R26.81 (ICD-10-CM) - Unsteadiness on feet  R26.89 (ICD-10-CM) - Other abnormalities of gait and mobility    THERAPY DIAG:  Unsteadiness on feet  Other  abnormalities of gait and mobility  Muscle weakness (generalized)  Other symptoms and signs involving the nervous system  Rationale for Evaluation and Treatment: Rehabilitation  SUBJECTIVE:                                                                                                                                                                                             SUBJECTIVE STATEMENT: Low back  is sore from shoveling snow or from standing and doing a jigsaw puzzle- did both this weekend. Denies recent falls.   Pt accompanied by: self  PERTINENT HISTORY: Left distal supraclinoid ICA aneurysm, MCI  PAIN:  Are you having pain? Yes: NPRS scale: 4/10 Pain location: low back Pain description: aching Aggravating factors: shoveling snow, standing  Relieving factors: sitting  PRECAUTIONS: Fall  RED FLAGS: None   WEIGHT BEARING RESTRICTIONS: No  FALLS: Has patient fallen in last 6 months? Yes. Number of falls 3-4 -Able to get up on his own from falls, though slowly per report.    LIVING ENVIRONMENT: Lives with: lives with their spouse; pt has to help her at times Lives in: House/apartment Stairs: Yes: External: 3 steps; on right going up Has following equipment at home: None  PLOF: Independent; Spin class 1x/wk, has tried [employer]; walks several times per week; enjoys yard work  PATIENT GOALS: Pt's goals for therapy to get up better from chairs, have better balance.  OBJECTIVE:      TODAY'S TREATMENT: 03/16/23 Activity Comments  Nustep L5 x 6 min UEs/LEs  Maintaining 80-100 SPM; good amplitude   Sitting prayer stretch with pball  To tolerance  gait training with cane, performing U turns, figure 8 turns, dual mental task Heavy cueing to increase step length with turns; some success with cueing for marching steps, more success with cues to perform the figure 8 in no more than 12 steps ; inconsistent use of cane   sidestepping onto/off foam Occasionally catching  feet on foam, cues to look down at feet for safe placement but avoiding trunk lean   stomp on bonsu ball + backwards step 15x each Weaning to 1 UE support; heavy cues for increase power/amplitude and avoiding anterior trunk lean   mini squats  2x15 Cues to look ahead to avoid trunk lean       Access Code: [email or web address] URL: [email or web address] Date: 02/20/2023 Prepared by: [facility] - Outpatient  Rehab - [facility]  Exercises - Seated Hamstring Stretch  - 1-2 x daily - 7 x weekly - 1 sets - 3 reps - 30 sec hold - Standing Gastroc Stretch at Counter  - 1-2 x daily - 7 x weekly - 1 sets - 3 reps - 30sec hold - Seated Active Hip Flexion  - 1 x daily - 5 x weekly - 2 sets - 10 reps - Sit to Stand with Armchair  - 1 x daily - 7 x weekly - 3 sets - 5 reps - Mini Squat with Counter Support  - 1 x daily - 5 x weekly - 3 sets - 10 reps - Alternating Step Backward with Support  - 1 x daily - 5 x weekly - 2 sets - 10 reps - Side Stepping with Counter Support  - 1 x daily - 5 x weekly - 2 sets - 10 reps - Standing Quarter Turn with Counter Support  - 1-2 x daily - 7 x weekly - 1 sets - 3-5 reps    ----------------------------------------------------------------- Note: Objective measures below were completed at Evaluation unless otherwise noted.  DIAGNOSTIC FINDINGS: NA per this episode  COGNITION: Overall cognitive status:  hx of MCI per MD note; WFL for session today   SENSATION: WFL  COORDINATION: Slowed RAM with toe tapping  EDEMA:  1+pitting edema BLEs MD is aware-has recommended compression stocking  MUSCLE TONE: Mild increase LLE tone  POSTURE: rounded shoulders, forward head, and posterior pelvic tilt  LOWER EXTREMITY  ROM:     Active  Right Eval Left Eval  Hip flexion    Hip extension    Hip abduction    Hip adduction    Hip internal rotation    Hip external rotation    Knee flexion    Knee extension    Ankle dorsiflexion 10 8  Ankle  plantarflexion    Ankle inversion    Ankle eversion     (Blank rows = not tested)    TRANSFERS: Assistive device utilized: None  Sit to stand: CGA and without use of BUEs, he has posterior lean, not as prominent with use of BUEs Stand to sit: CGA   GAIT: Gait pattern: step through pattern, decreased step length- Right, decreased step length- Left, shuffling, trunk flexed, and narrow BOS Distance walked: 50 ft x 2 Assistive device utilized: None Level of assistance: SBA  FUNCTIONAL TESTS:  5 times sit to stand: 18 sec with 3 of 5 episodes of posterior lean Timed up and go (TUG): 12.41 sec 10 meter walk test: 10 sec  MiniBESTest:  14/28 TUG cognitive:  23.25 esc TUG manual:  12.94 sec 360 turn R:  13 steps, 4.66 sec 360 turn L:  12 steps, 4.44 sec 68M back:  7.34 sec, small, quick, narrow BOS steps       GOALS: Goals reviewed with patient? Yes  SHORT TERM GOALS: Target date: 01/23/2023  Pt will be independent with HEP for improved balance, strength, gait. Baseline: Goal status: MET 01/22/2023  2.  Pt will improve 5x sit<>stand to less than or equal to 15 sec with no posterior lean to demonstrate improved functional strength and transfer efficiency. Baseline: 18 sec, 3 of 5 reps posterior lean>15.13 sec with UE support at chair, no posterior lean Goal status: MET 01/22/2023  3.  Pt will improve TUG cognitive score to less than or equal to 18 sec for decreased fall risk. Baseline: 23.45 sec>15.53 sec Goal status: MET 01/22/2023   LONG TERM GOALS: Target date: 02/20/2023>03/27/2023  Pt will be independent with progression of HEP for improved balance, strength, gait. Baseline: update given 02/20/2023 Goal status: IN PROGRESS, 12/202024  2.  Pt will improve 5x sit<>stand to less than or equal to 12.5 sec and no posterior lean to demonstrate improved functional strength and transfer efficiency. Baseline: 22.44 sec with initial posterior lean (hands on knees) Goal  status: NOT YET MET, 02/18/2023; IN PROGRESS  3.  Pt will improve MiniBESTest score to at least 19/28 to decrease fall risk. Baseline: 14/28>17/28 02/2023 Goal status: IN PROGRESS, 02/20/2023  4.  Pt will perform 360 turn to R and L in 8 steps or less for improved balance. Baseline: 12-13 steps; 12-14 steps with supervision 02/18/2023 Goal status: NOT YET MET, IN PROGRESS 02/18/2023  5.  Pt will perform posterior push and release test in 2 steps or less, for improved posterior balance recovery. Baseline: multiple small steps, would fall if unaided 02/20/2023 Goal status: IN PROGRESS, 02/20/2023  6.  Pt will verbalize understanding of local Parkinson's disease community resources, including optimal fitness program. Baseline:  Goal status: IN PROGRESS  ASSESSMENT:  CLINICAL IMPRESSION: Patient arrived to session with report of mild LBP from weekend's activities, including shoveling snow. Gentle stretching was performed to address LBP. Proceeded with gait training with balance and dual task activities. Patient continues with inconsistent cane sequencing and cues required to lengthen steps with turns. Patient also performed stepping strategy activities and compliant surface training. Patient has tendency to lean forward  with trunk; some improvement in posture with cues to maintain gaze on target. Patient tolerated session well and without complaints upon leaving.   OBJECTIVE IMPAIRMENTS: Abnormal gait, decreased balance, decreased knowledge of use of DME, decreased mobility, difficulty walking, decreased ROM, decreased strength, impaired flexibility, and postural dysfunction.   ACTIVITY LIMITATIONS: standing, squatting, transfers, and locomotion level  PARTICIPATION LIMITATIONS: community activity and yard work  PERSONAL FACTORS: 3+ comorbidities: see above  are also affecting patient's functional outcome.   REHAB POTENTIAL: Good  CLINICAL DECISION MAKING: Evolving/moderate  complexity  EVALUATION COMPLEXITY: Moderate  PLAN:  PT FREQUENCY: 2x/week  PT DURATION: 8 weeks plus eval  PLANNED INTERVENTIONS: 97110-Therapeutic exercises, 97530- Therapeutic activity, 97112- Neuromuscular re-education, 97535- Self Care, 02859- Manual therapy, [phone]- Gait training, Patient/Family education, Balance training, and DME instructions  PLAN FOR NEXT SESSION: Review quarter turns (more like 10:00/12:00/2:00); gait training with turns and cane.  Continue to address balance/retropulsion, turns, backwards and side balance recovery.  Work on sit to stand, squats, balance strategies, compliant surface work to counteract posterior lean/LOB.      Louana Terrilyn Christians, PT, DPT 03/16/23 11:01 AM  [facility] at [facility] [address] [place], [address] [place], [place] [address] Phone # [phone] Fax # [phone]

## 2023-03-16 ENCOUNTER — Ambulatory Visit: Payer: Medicare Other | Admitting: Physical Therapy

## 2023-03-16 ENCOUNTER — Encounter: Payer: Self-pay | Admitting: Physical Therapy

## 2023-03-16 DIAGNOSIS — M6281 Muscle weakness (generalized): Secondary | ICD-10-CM

## 2023-03-16 DIAGNOSIS — R293 Abnormal posture: Secondary | ICD-10-CM | POA: Diagnosis not present

## 2023-03-16 DIAGNOSIS — R278 Other lack of coordination: Secondary | ICD-10-CM | POA: Diagnosis not present

## 2023-03-16 DIAGNOSIS — R2689 Other abnormalities of gait and mobility: Secondary | ICD-10-CM

## 2023-03-16 DIAGNOSIS — R2681 Unsteadiness on feet: Secondary | ICD-10-CM | POA: Diagnosis not present

## 2023-03-16 DIAGNOSIS — R29818 Other symptoms and signs involving the nervous system: Secondary | ICD-10-CM | POA: Diagnosis not present

## 2023-03-19 ENCOUNTER — Ambulatory Visit: Payer: Medicare Other | Admitting: Physical Therapy

## 2023-03-19 ENCOUNTER — Encounter: Payer: Self-pay | Admitting: Physical Therapy

## 2023-03-19 ENCOUNTER — Ambulatory Visit: Payer: Medicare Other | Admitting: Occupational Therapy

## 2023-03-19 DIAGNOSIS — R2681 Unsteadiness on feet: Secondary | ICD-10-CM

## 2023-03-19 DIAGNOSIS — M6281 Muscle weakness (generalized): Secondary | ICD-10-CM

## 2023-03-19 DIAGNOSIS — R278 Other lack of coordination: Secondary | ICD-10-CM | POA: Diagnosis not present

## 2023-03-19 DIAGNOSIS — R29818 Other symptoms and signs involving the nervous system: Secondary | ICD-10-CM | POA: Diagnosis not present

## 2023-03-19 DIAGNOSIS — R293 Abnormal posture: Secondary | ICD-10-CM | POA: Diagnosis not present

## 2023-03-19 DIAGNOSIS — R2689 Other abnormalities of gait and mobility: Secondary | ICD-10-CM | POA: Diagnosis not present

## 2023-03-19 NOTE — Therapy (Signed)
OUTPATIENT PHYSICAL THERAPY NEURO TREATMENT NOTE   Patient Name: Cody Dickerson MRN: [id number] DOB:12/16/1950, 73 y.o., male Today's Date: 03/19/2023   PCP: Daisy Floro, MD REFERRING PROVIDER: Tat, Octaviano Batty, DO        END OF SESSION:  PT End of Session - 03/19/23 1021     Visit Number 17    Number of Visits 23    Date for PT Re-Evaluation 03/27/23    Authorization Type Medicare/[employer]    Progress Note Due on Visit 20    PT Start Time 1019    PT Stop Time 1059    PT Time Calculation (min) 40 min    Equipment Utilized During Treatment Gait belt    Activity Tolerance Patient tolerated treatment well    Behavior During Therapy WFL for tasks assessed/performed                         Past Medical History:  Diagnosis Date   Balance problem    Gout    Hypercholesterolemia    Hypertension    Parkinson's disease (HCC)    Small bowel obstruction (HCC)    Past Surgical History:  Procedure Laterality Date   BOWEL BLOCKAGE  05/2019   IR ANGIO INTRA EXTRACRAN SEL COM CAROTID INNOMINATE BILAT MOD SED  10/03/2021   IR ANGIO VERTEBRAL SEL VERTEBRAL UNI R MOD SED  10/03/2021   IR RADIOLOGIST EVAL & MGMT  08/19/2021   IR US GUIDE VASC ACCESS RIGHT  10/04/2021   Patient Active Problem List   Diagnosis Date Noted   Pain due to onychomycosis of toenails of both feet 09/03/2022   Porokeratosis 06/04/2022   Parkinson's disease (HCC) 02/06/2021   SBO (small bowel obstruction) (HCC) 07/06/2019    ONSET DATE: 12/15/2022 (MD referral)  REFERRING DIAG:  R29.3 (ICD-10-CM) - Abnormal posture  R27.8 (ICD-10-CM) - Other lack of coordination  R25.1 (ICD-10-CM) - Tremor  M62.81 (ICD-10-CM) - Muscle weakness (generalized)  R29.898 (ICD-10-CM) - Other symptoms and signs involving the musculoskeletal system  R26.81 (ICD-10-CM) - Unsteadiness on feet  R26.89 (ICD-10-CM) - Other abnormalities of gait and mobility    THERAPY DIAG:  Unsteadiness on feet  Other  abnormalities of gait and mobility  Muscle weakness (generalized)  Other symptoms and signs involving the nervous system  Rationale for Evaluation and Treatment: Rehabilitation  SUBJECTIVE:                                                                                                                                                                                             SUBJECTIVE STATEMENT: Low  back is sore, but feeling somewhat better.   Pt accompanied by: self  PERTINENT HISTORY: Left distal supraclinoid ICA aneurysm, MCI  PAIN:  Are you having pain? Yes: NPRS scale: 2-3/10 Pain location: low back Pain description: aching Aggravating factors: shoveling snow, standing  Relieving factors: sitting  PRECAUTIONS: Fall  RED FLAGS: None   WEIGHT BEARING RESTRICTIONS: No  FALLS: Has patient fallen in last 6 months? Yes. Number of falls 3-4 -Able to get up on his own from falls, though slowly per report.    LIVING ENVIRONMENT: Lives with: lives with their spouse; pt has to help her at times Lives in: House/apartment Stairs: Yes: External: 3 steps; on right going up Has following equipment at home: None  PLOF: Independent; Spin class 1x/wk, has tried [employer]; walks several times per week; enjoys yard work  PATIENT GOALS: Pt's goals for therapy to get up better from chairs, have better balance.  OBJECTIVE:     TODAY'S TREATMENT: 03/19/2023 Activity Comments  NuStep, Level 4-5, 4 extremities, x 6 minutes 2 min warm up, then >80 SPM   Seated flexion stretch for low back using therapy ball Reports good stretch  Reviewed standing exercises -sidestep and weightshift -back step and weightshift -turning practice at counter -minisquats Good return demo with minimal cues (reports he is not doing them consistently, as he tends to forget)        Gait training with cane, 50 ft x 8 reps Cues for sequence with cane; pt starts out well, then speeds up and shortens steps, with  "catching up" to cane sequence   Pt performs PWR! Moves in sitting, standing position x 10 reps   PWR! Up for improved posture  PWR! Rock for improved weighshifting  PWR! Twist for improved trunk rotation -sitting only  PWR! Step for improved step initiation   Cues provided for technique, intensity, good stretch   PATIENT EDUCATION: Education details: ways to incorporate exercises into daily routine-sticky note at counter for reminder; reminder on phone Person educated: Patient Education method: Explanation Education comprehension: verbalized understanding   Access Code: [email or web address] URL: [email or web address] Date: 02/20/2023 Prepared by: [facility] - Outpatient  Rehab - [facility]  Exercises - Seated Hamstring Stretch  - 1-2 x daily - 7 x weekly - 1 sets - 3 reps - 30 sec hold - Standing Gastroc Stretch at Counter  - 1-2 x daily - 7 x weekly - 1 sets - 3 reps - 30sec hold - Seated Active Hip Flexion  - 1 x daily - 5 x weekly - 2 sets - 10 reps - Sit to Stand with Armchair  - 1 x daily - 7 x weekly - 3 sets - 5 reps - Mini Squat with Counter Support  - 1 x daily - 5 x weekly - 3 sets - 10 reps - Alternating Step Backward with Support  - 1 x daily - 5 x weekly - 2 sets - 10 reps - Side Stepping with Counter Support  - 1 x daily - 5 x weekly - 2 sets - 10 reps - Standing Quarter Turn with Counter Support  - 1-2 x daily - 7 x weekly - 1 sets - 3-5 reps    ----------------------------------------------------------------- Note: Objective measures below were completed at Evaluation unless otherwise noted.  DIAGNOSTIC FINDINGS: NA per this episode  COGNITION: Overall cognitive status:  hx of MCI per MD note; WFL for session today   SENSATION: WFL  COORDINATION: Slowed RAM with toe tapping  EDEMA:  1+pitting edema BLEs MD is aware-has recommended compression stocking  MUSCLE TONE: Mild increase LLE tone  POSTURE: rounded shoulders, forward head, and  posterior pelvic tilt  LOWER EXTREMITY ROM:     Active  Right Eval Left Eval  Hip flexion    Hip extension    Hip abduction    Hip adduction    Hip internal rotation    Hip external rotation    Knee flexion    Knee extension    Ankle dorsiflexion 10 8  Ankle plantarflexion    Ankle inversion    Ankle eversion     (Blank rows = not tested)    TRANSFERS: Assistive device utilized: None  Sit to stand: CGA and without use of BUEs, he has posterior lean, not as prominent with use of BUEs Stand to sit: CGA   GAIT: Gait pattern: step through pattern, decreased step length- Right, decreased step length- Left, shuffling, trunk flexed, and narrow BOS Distance walked: 50 ft x 2 Assistive device utilized: None Level of assistance: SBA  FUNCTIONAL TESTS:  5 times sit to stand: 18 sec with 3 of 5 episodes of posterior lean Timed up and go (TUG): 12.41 sec 10 meter walk test: 10 sec  MiniBESTest:  14/28 TUG cognitive:  23.25 esc TUG manual:  12.94 sec 360 turn R:  13 steps, 4.66 sec 360 turn L:  12 steps, 4.44 sec 36M back:  7.34 sec, small, quick, narrow BOS steps       GOALS: Goals reviewed with patient? Yes  SHORT TERM GOALS: Target date: 01/23/2023  Pt will be independent with HEP for improved balance, strength, gait. Baseline: Goal status: MET 01/22/2023  2.  Pt will improve 5x sit<>stand to less than or equal to 15 sec with no posterior lean to demonstrate improved functional strength and transfer efficiency. Baseline: 18 sec, 3 of 5 reps posterior lean>15.13 sec with UE support at chair, no posterior lean Goal status: MET 01/22/2023  3.  Pt will improve TUG cognitive score to less than or equal to 18 sec for decreased fall risk. Baseline: 23.45 sec>15.53 sec Goal status: MET 01/22/2023   LONG TERM GOALS: Target date: 02/20/2023>03/27/2023  Pt will be independent with progression of HEP for improved balance, strength, gait. Baseline: update given  02/20/2023 Goal status: IN PROGRESS, 12/202024  2.  Pt will improve 5x sit<>stand to less than or equal to 12.5 sec and no posterior lean to demonstrate improved functional strength and transfer efficiency. Baseline: 22.44 sec with initial posterior lean (hands on knees) Goal status: NOT YET MET, 02/18/2023; IN PROGRESS  3.  Pt will improve MiniBESTest score to at least 19/28 to decrease fall risk. Baseline: 14/28>17/28 02/2023 Goal status: IN PROGRESS, 02/20/2023  4.  Pt will perform 360 turn to R and L in 8 steps or less for improved balance. Baseline: 12-13 steps; 12-14 steps with supervision 02/18/2023 Goal status: NOT YET MET, IN PROGRESS 02/18/2023  5.  Pt will perform posterior push and release test in 2 steps or less, for improved posterior balance recovery. Baseline: multiple small steps, would fall if unaided 02/20/2023 Goal status: IN PROGRESS, 02/20/2023  6.  Pt will verbalize understanding of local Parkinson's disease community resources, including optimal fitness program. Baseline:  Goal status: IN PROGRESS  ASSESSMENT:  CLINICAL IMPRESSION: Skilled PT session focused on aerobic activities as well as standing balance /posture exercises.  Utilized seated and standing PWR! Moves today, in addition to his exercise program.  He does well with  PWR! Moves, but did not yet add to HEP, as pt reports he is not doing his standing HEP every day.  Educated on importance of consistent HEP performance.  Pt will benefit from skilled PT, and may benefit from trial again with seated/standing PWR! Moves for part of HEP, to help with posture, balance, weightshifting.  OBJECTIVE IMPAIRMENTS: Abnormal gait, decreased balance, decreased knowledge of use of DME, decreased mobility, difficulty walking, decreased ROM, decreased strength, impaired flexibility, and postural dysfunction.   ACTIVITY LIMITATIONS: standing, squatting, transfers, and locomotion level  PARTICIPATION LIMITATIONS:  community activity and yard work  PERSONAL FACTORS: 3+ comorbidities: see above  are also affecting patient's functional outcome.   REHAB POTENTIAL: Good  CLINICAL DECISION MAKING: Evolving/moderate complexity  EVALUATION COMPLEXITY: Moderate  PLAN:  PT FREQUENCY: 2x/week  PT DURATION: 8 weeks plus eval  PLANNED INTERVENTIONS: 97110-Therapeutic exercises, 97530- Therapeutic activity, [id number]- Neuromuscular re-education, 97535- Self Care, [id number]- Manual therapy, [phone]- Gait training, Patient/Family education, Balance training, and DME instructions  PLAN FOR NEXT SESSION: Seated/standing PWR! Moves as part of HEP?; gait training with turns and cane.  Continue to address balance/retropulsion, turns, backwards and side balance recovery.  Work on sit to stand, squats, balance strategies, compliant surface work to counteract posterior lean/LOB.      Lonia Blood, PT 03/19/23 1:17 PM Phone: [phone] Fax: [phone]   [facility] at [facility] [address] [place], [address] [place], [place] [address] Phone # [phone] Fax # [phone]

## 2023-03-19 NOTE — Therapy (Signed)
OUTPATIENT OCCUPATIONAL THERAPY PARKINSON'S  Treatment Note & Re-cert  Patient Name: Cody Dickerson MRN: [id number] DOB:[birth year], 73 y.o., male Today's Date: 03/20/2023  PCP: Daisy Floro, MD  REFERRING PROVIDER: Tat, Octaviano Batty, DO   END OF SESSION:  OT End of Session - 03/19/23 0932     Visit Number 10    Number of Visits 24    Date for OT Re-Evaluation 05/01/23    OT Start Time 0931    OT Stop Time 1015    OT Time Calculation (min) 44 min    Activity Tolerance Patient tolerated treatment well    Behavior During Therapy [facility] for tasks assessed/performed                      Past Medical History:  Diagnosis Date   Balance problem    Gout    Hypercholesterolemia    Hypertension    Parkinson's disease (HCC)    Small bowel obstruction (HCC)    Past Surgical History:  Procedure Laterality Date   BOWEL BLOCKAGE  05/2019   IR ANGIO INTRA EXTRACRAN SEL COM CAROTID INNOMINATE BILAT MOD SED  10/03/2021   IR ANGIO VERTEBRAL SEL VERTEBRAL UNI R MOD SED  10/03/2021   IR RADIOLOGIST EVAL & MGMT  08/19/2021   IR US GUIDE VASC ACCESS RIGHT  10/04/2021   Patient Active Problem List   Diagnosis Date Noted   Pain due to onychomycosis of toenails of both feet 09/03/2022   Porokeratosis 06/04/2022   Parkinson's disease (HCC) 02/06/2021   SBO (small bowel obstruction) (HCC) 07/06/2019    ONSET DATE: 12/15/22 (referral date),  Pt reports Parkinson's dx approx. 3 years ago (approx. 2021)  REFERRING DIAG:   R29.3 (ICD-10-CM) - Abnormal posture  R27.8 (ICD-10-CM) - Other lack of coordination  R25.1 (ICD-10-CM) - Tremor  M62.81 (ICD-10-CM) - Muscle weakness (generalized)  R29.898 (ICD-10-CM) - Other symptoms and signs involving the musculoskeletal system  R26.81 (ICD-10-CM) - Unsteadiness on feet  R26.89 (ICD-10-CM) - Other abnormalities of gait and mobility    THERAPY DIAG:  Unsteadiness on feet  Muscle weakness (generalized)  Other symptoms and signs involving  the nervous system  Other lack of coordination  Rationale for Evaluation and Treatment: Rehabilitation  SUBJECTIVE:   SUBJECTIVE STATEMENT: Pt reports utilizing big movements to aid in getting jacket on and recognizes that that is very helpful.  Pt reports increased challenge with getting pants on and shirt will get balled up in back when donning shirt overhead.    Pt accompanied by: self  PERTINENT HISTORY: 08/12/22 MD Progress Notes and 08/18/22 DO Progress Notes: Parkinson's disease, left distal supraclinoid ICA aneurysm (aneurysm size stable), MCI, HTN, gout, hypercholesterolemia  PRECAUTIONS: Fall, posterior lean when standing/walking   WEIGHT BEARING RESTRICTIONS: No  PAIN:  Are you having pain? Yes: NPRS scale: 3/10 Pain location: back - from shoveling snow over the weekend Pain description: sore Aggravating factors: twisting Relieving factors: rest  FALLS: Has patient fallen in last 6 months? Yes. Number of falls Pt reports approx. 4 falls. Pt reports picking up sticks in yard when 2 falls occurred.  Per 12/11/22 PT Screen: Pt reports "Balance is bad". Have had several falls in the yard.   LIVING ENVIRONMENT: Lives with: lives with their spouse Lives in: House/apartment Stairs: Yes: External: 3 steps; on left going up Has following equipment at home: Grab bars  PLOF: Independent with basic ADLs, pt currently drives  PATIENT GOALS: "to do learn how to  do things better."  OBJECTIVE:  Note: Objective measures were completed at Evaluation unless otherwise noted.  HAND DOMINANCE: Right  ADLs: Overall ADLs:  Transfers/ambulation related to ADLs: Eating: ind, pt reports slower and more meticulous to get items on fork. No difficulty with cutting and serving food. Grooming: Pt reports shaving with standard razor blade is slower. Pt has electric shaver available though pt has not attempted to use it. UB Dressing: Pt reports "Not as good putting jacket on as I used to be."  Pt currently using cape method to don jacket, a strategy learned in previous OT sessions. Increased difficulty with buttons: "I have more trouble with buttoning buttons placed in front of me than when a shirt is on me." Pt reports taking extra time to work out zippers if zippers get stuck. LB Dressing: "Getting my pant legs on and off are a little slower but it's acceptable." Toileting: Ind Bathing: Ind with someone else in the home for safety Tub Shower transfers: ind Equipment: Grab bars, Sports administrator, and tub/shower  IADLs: Shopping: spouse completes grocery shopping, pt helps out with carrying heavy water Light housekeeping: pt folds laundry without difficulty Meal Prep: Ind with cutting, pt completes charcoal grilling tasks and pt reports being careful and walking slow around grill for safety. 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 Handwriting: Pt reports handwriting is slower than it used to be, and even slower if he focuses on writing legibly.   Pt wrote "[place]ales live in [place] ocean" in 18 seconds. Increased time required and approx. 66% legible.  MOBILITY STATUS: Ind without A/E. PT reported to OT that pt demo's significant posterior lean when standing and walking. Pt reports "imbalance tends to go backwards."  POSTURE COMMENTS:  rounded shoulders and forward head  ACTIVITY TOLERANCE: Activity tolerance: Pt reports increased fatigue and sleepiness during the day. Pt reports sleeping well at night. Pt reports increased fatigue may be d/t medication though pt is unsure.  FUNCTIONAL OUTCOME MEASURES: 03/19/23 Fastening/unfastening 3 buttons: 2:23 (however last button required 55 seconds to unfasten). OT noted mild tremors of BUE when completing task.  Physical performance test: PPT#2 (simulated eating) 15.22 seconds. OT noted mild tremors of RUE when  completing task.  PPT#4 (donning/doffing jacket): 22.31 sec   Fastening/unfastening 3 buttons: 98 seconds. OT noted mild tremors of BUE when completing task.  Physical performance test: PPT#2 (simulated eating) 17 seconds. OT noted mild tremors of RUE when completing task.  PPT#4 (donning/doffing jacket): 18 seconds. With gait belt. OT noted mild tremors of BUE during task.  COORDINATION: 03/19/23 9 Hole Peg test: Right: 46.56 sec; Left: 49.85 sec Box and Blocks:  Right 37 blocks, Left 40 blocks  01/22/23: 9 Hole Peg test: Right: 43.5 sec; Left: 41.25 sec Box and Blocks:  Right 38 blocks, Left 38 blocks  EVAL: 9 Hole Peg test: Right: 36 sec; Left: 36 sec Box and Blocks:  Right 34 blocks, Left 41 blocks  UE ROM:    BUE shoulders flex/ABD - WFL.  BUE elbow flex/ext - WFL with v/c to fully ext. R elbow ext slower than L elbow ext.  Wrist flex/ext - WFL, v/c to fully ext.  Digits flex/ext - WFL, v/c to fully ext thumbs with slightly decreased AROM of B thumbs noted.  Note: Pt demo'd decreased speed to attain full AROM of BUE.   UE MMT:   Not tested  SENSATION:  WFL Pt reports no change.  COGNITION: Overall cognitive status: Within functional limits for tasks assessed. Pt reports more difficulty remembering day of the week, taking medication at 4 PM specifically. Pt reports using calendar to help with memory.   OBSERVATIONS: Pt was pleasant and agreeable. Pt recognized difficulty with ADL/IADL tasks and described current level of functioning well. OT noted Bradykinesia and mild tremors with FM tasks . Pt donned reading glasses for handwriting task. Pt had hematoma on LUE, dorsal aspect of L hand. When asked about hematoma, pt reports "I don't know [how I got it], maybe on screen door, but I bruise easily."  TODAY'S TREATMENT:                                                                      DATE:  03/19/23 Handwriting: engaged in writing with focus on legibility, sizing,  and pacing.  Pt wrote sample check with 75% legibility and min micrographia when writing in cursive.  Pt reports "trying to eliminate" the small sizing when writing, however still with progressive micrographia with increased writing. Large amplitude: engaged in finger flicks and forward reaching with forearm reach with elbow and wrist extension and hand opening.  Completed x10 with min cues and demonstration for increased amplitude.  Engaged in reaching outside BOS in sitting to facilitate increased amplitude and weight shifting as needed to carry over to ADLs.      02/03/23 Large amplitude: engaged in thumb opposition with finger extension and finger flicks with focus on full finger extension and opening hand prior to engaging in [facility] tasks.  OT encouraging pt to exaggerate opening throughout coordination tasks.   Coins: completed with R and L hand, engaged in picking up coins, stacking and unstacking coins, and translating from palm to finger tips to place into coin slot with focus on large amplitude, purposeful movements.  Pen tricks: engaged in rotation, flip, translation, and shift with pen. OT providing demonstration and verbal cues to facilitate increased carryover of technique. Pt completing rotation and flip with focus on amplitude and education on intrinsic hand muscles and coordination to carryover to handwriting.   Handwriting: engaged in sample handwriting with print and cursive.  Pt with improved legibilty with print, therefore OT encouraging print and taking a break between longer words to "reset" hand placement.  Reviewed use of lined paper and focus on writing large.   Pt with mild improvements with repetition and focus on pacing and size.   01/28/23 Large amplitude: engaged in finger flicks with focus on full finger extension and opening hand prior to engaging in [facility] tasks.  OT encouraging pt to exaggerate opening throughout coordination tasks.   Pegs: engaged in small peg board  pattern replication with R hand with focus on large amplitude opening of hand to facilitate increased ease when picking up pegs.  OT providing min cues initially for exaggerated hand opening with improved carryover with repetition.  Removed pegs one at a time, placing multiple into palm and then translating one by one to finger tips to place in container.  Pt with increased difficulty when removing pegs from peg board, however with good technique with translation of pegs from palm to finger tips.   Buttons: completed in 83 seconds with  increased time/effort and 79 seconds.  Pt continues to demonstrate difficulty with fastening and unfastening buttons, greater difficulty with fastening.   Coins: modifying card exercises to complete with coins.  Pt picking up and rotating coins large with focus on rotation in finger tips as well as forearm rotation, engaged in translation of coins from palm to finger tips.  Pt with good technique with use of coins this session.    PATIENT EDUCATION: Education details:  PD specific education for large amplitude/coordination Person educated: Patient Education method: Explanation and Handouts Education comprehension: verbalized understanding  HOME EXERCISE PROGRAM: 01/20/23 - coordination exercises (see pt instructions)  01/15/23 - bag exercises (see pt instructions)  01/08/23 - large amplitude hands (see pt instructions)  GOALS: Goals reviewed with patient? Yes  GOALS:  SHORT TERM GOALS: Target date: 01/23/23    Pt will be independent with PD specific HEP. Baseline: not yet initiated Goal status: MET - completing large amplitude, bag exercises, and coordination HEP on 01/22/23  2.  Pt will verbalize understanding of adapted strategies to maximize safety and independence with ADLs/IADLs. Baseline: not yet initiated Goal status: MET - 01/22/23 3.  Pt will write a simple sentence with at least 75% legibility and complete the sentence in 15 seconds or less.   Baseline: 66% legibility, 18 seconds for simple sentence Goal status: PARTIALLY MET - required increased time (26 sec) however improved legibility to 75% on 01/22/23  4.  Pt will demonstrate improved fine motor coordination for ADLs as evidenced by completing 9 hole peg test score for BUE in 30 seconds or less. Baseline: 9 Hole Peg test: Right: 36 sec; Left: 36 sec Goal status: NOT MET - Right: 43.5 sec; Left: 41.25 sec on 01/22/23   5.  Pt will be able to place at least 45 blocks with BUE hand with completion of Box and Blocks test. Baseline: Right 34 blocks, Left 41 blocks Goal status: NOT MET - Completed R: 38 and L: 38 on 01/22/23    LONG TERM GOALS: Target date: 02/20/23    Pt will verbalize understanding of ways to prevent future PD related complications and PD community resources. Baseline: not yet initiated Goal status: IN PROGRESS  2.  Pt will write a simulated/sample check with at least 85% legibility while maintaining steady speed. Baseline: 66% legibility for simple sentence, 18 seconds for simple sentence  Goal status: NOT MET - 75% legibility with slower pace - 30 seconds for simple sentence on 03/19/23  5.  Pt will demonstrate improved ease with fastening buttons as evidenced by decreasing 3 button/unbutton time to 80 seconds or less. Baseline: 98 seconds Goal status: NOT MET - 2:23 (last button required 55 seconds to unfasten) on 03/19/23  3.  Pt will verbalize understanding of ways to keep thinking skills sharp and ways to compensate for STM changes in the future. Baseline: not yet initiated Goal status: IN PROGRESS  4.  Pt will demonstrate improved ease with feeding as evidenced by decreasing PPT#2 to 15 seconds or less. Baseline: PPT #2: 17 secs, pt reports difficulty placing food on fork Goal status: MET - 15 secs on 03/19/23  5.  Pt will demonstrate increased ease with dressing as evidenced by decreasing PPT#4 (don/ doff jacket) to 15 secs or less. Baseline:  PPT #4: 18 secs Goal status: NOT MET 22.31 sec on 03/19/23   NEW LONG TERM GOALS: Target date: 04/30/22    Pt will verbalize understanding of ways to prevent future PD related complications and  PD community resources. Baseline: not yet initiated Goal status: IN PROGRESS  2.  Pt will write a simulated/sample check with at least 85% legibility while maintaining steady speed. Baseline: 66% legibility for simple sentence, 18 seconds for simple sentence   03/19/23 - 75% legibility, 30 seconds for simple sentence Goal status: IN PROGRESS  3.  Pt will demonstrate improved ease with fastening buttons as evidenced by decreasing 3 button/unbutton time to 80 seconds or less. Baseline: 98 seconds 03/19/23 - 2:23 (last button required 55 seconds to unfasten) Goal status: IN PROGRESS  4.  Pt will verbalize understanding of ways to keep thinking skills sharp and ways to compensate for STM changes in the future. Baseline: not yet initiated Goal status: IN PROGRESS  5.  Pt will demonstrate increased ease with dressing as evidenced by decreasing PPT#4 (don/ doff jacket) to 15 secs or less. Baseline: PPT #4: 18 secs 03/19/23 - 22:31 sec Goal status: IN PROGRESS    ASSESSMENT:  CLINICAL IMPRESSION: Pt had extensive gap between last visit and this visit due to scheduling issues, therefore not seen since 02/03/23.  Therefore OT completed re-evaluation with focus on areas of concern and discussion of meaningful goals.  Pt demonstrating overall slowing with fine motor control tasks, with exception of self-feeding.  Pt does demonstrate mild improvements in legibility with handwriting, however still requiring increased time with handwriting.  Pt will benefit from continued skilled occupational therapy services to address coordination, ROM, balance, GM/FM control, safety awareness, introduction of compensatory strategies/AE prn, and implementation of an HEP to improve participation and safety during ADLs, IADLs, and  quality of life.    PERFORMANCE DEFICITS: in functional skills including ADLs, IADLs, coordination, dexterity, proprioception, ROM, strength, Fine motor control, Gross motor control, mobility, balance, body mechanics, endurance, and UE functional use, cognitive skills including energy/drive and memory, and psychosocial skills including environmental adaptation.   IMPAIRMENTS: are limiting patient from ADLs, IADLs, leisure, and social participation.     PLAN:  OT FREQUENCY: 2x/week  OT DURATION: 6 weeks (dates extended to allow for scheduling)  PLANNED INTERVENTIONS: 97168 OT Re-evaluation, 97535 self care/ADL training, 16109 therapeutic exercise, 97530 therapeutic activity, 97112 neuromuscular re-education, 97140 manual therapy, 97035 ultrasound, 97018 paraffin, 60454 fluidotherapy, 97010 moist heat, 97010 cryotherapy, 97032 electrical stimulation (manual), 97014 electrical stimulation unattended, 97760 Orthotics management and training, 09811 Splinting (initial encounter), [id number] Subsequent splinting/medication, passive range of motion, functional mobility training, energy conservation, patient/family education, and DME and/or AE instructions  RECOMMENDED OTHER SERVICES: PT eval already completed on 12/24/22.  CONSULTED AND AGREED WITH PLAN OF CARE: Patient  PLAN FOR NEXT SESSION:   Large amplitude movements  Bag exercises for UB and LB dressing  FM tasks - buttons, other FMC tasks, handwriting  Donning/doffing jacket - review strategies (e.g. "cape method")   Birdie Beveridge, OT 03/20/2023, 7:45 AM

## 2023-03-20 NOTE — Therapy (Signed)
OUTPATIENT PHYSICAL THERAPY NEURO TREATMENT NOTE   Patient Name: Cody Dickerson MRN: [id number] DOB:06-Mar-1950, 73 y.o., male Today's Date: 03/23/2023   PCP: Daisy Floro, MD REFERRING PROVIDER: Tat, Octaviano Batty, DO        END OF SESSION:  PT End of Session - 03/23/23 1056     Visit Number 18    Number of Visits 23    Date for PT Re-Evaluation 03/27/23    Authorization Type Medicare/[employer]    Progress Note Due on Visit 20    PT Start Time 1015    PT Stop Time 1056    PT Time Calculation (min) 41 min    Equipment Utilized During Treatment Gait belt    Activity Tolerance Patient tolerated treatment well    Behavior During Therapy WFL for tasks assessed/performed                          Past Medical History:  Diagnosis Date   Balance problem    Gout    Hypercholesterolemia    Hypertension    Parkinson's disease (HCC)    Small bowel obstruction (HCC)    Past Surgical History:  Procedure Laterality Date   BOWEL BLOCKAGE  05/2019   IR ANGIO INTRA EXTRACRAN SEL COM CAROTID INNOMINATE BILAT MOD SED  10/03/2021   IR ANGIO VERTEBRAL SEL VERTEBRAL UNI R MOD SED  10/03/2021   IR RADIOLOGIST EVAL & MGMT  08/19/2021   IR US GUIDE VASC ACCESS RIGHT  10/04/2021   Patient Active Problem List   Diagnosis Date Noted   Pain due to onychomycosis of toenails of both feet 09/03/2022   Porokeratosis 06/04/2022   Parkinson's disease (HCC) 02/06/2021   SBO (small bowel obstruction) (HCC) 07/06/2019    ONSET DATE: 12/15/2022 (MD referral)  REFERRING DIAG:  R29.3 (ICD-10-CM) - Abnormal posture  R27.8 (ICD-10-CM) - Other lack of coordination  R25.1 (ICD-10-CM) - Tremor  M62.81 (ICD-10-CM) - Muscle weakness (generalized)  R29.898 (ICD-10-CM) - Other symptoms and signs involving the musculoskeletal system  R26.81 (ICD-10-CM) - Unsteadiness on feet  R26.89 (ICD-10-CM) - Other abnormalities of gait and mobility    THERAPY DIAG:  Unsteadiness on feet  Muscle  weakness (generalized)  Other symptoms and signs involving the nervous system  Other abnormalities of gait and mobility  Rationale for Evaluation and Treatment: Rehabilitation  SUBJECTIVE:                                                                                                                                                                                             SUBJECTIVE STATEMENT:  Nothing new since last session.   Pt accompanied by: self  PERTINENT HISTORY: Left distal supraclinoid ICA aneurysm, MCI  PAIN:  Are you having pain? Yes: NPRS scale: 0/10 Pain location: low back Pain description: aching Aggravating factors: shoveling snow, standing  Relieving factors: sitting  PRECAUTIONS: Fall  RED FLAGS: None   WEIGHT BEARING RESTRICTIONS: No  FALLS: Has patient fallen in last 6 months? Yes. Number of falls 3-4 -Able to get up on his own from falls, though slowly per report.    LIVING ENVIRONMENT: Lives with: lives with their spouse; pt has to help her at times Lives in: House/apartment Stairs: Yes: External: 3 steps; on right going up Has following equipment at home: None  PLOF: Independent; Spin class 1x/wk, has tried [employer]; walks several times per week; enjoys yard work  PATIENT GOALS: Pt's goals for therapy to get up better from chairs, have better balance.  OBJECTIVE:    TODAY'S TREATMENT: 03/23/23 Activity Comments  TM walking 0.6-1.5 MPH Cueing to look ahead at target on wall, lift chest, long steps   sitting PWR moves (add into HEP) Up 2x10 Rock  2x10 Twist  2x10 Step  2x10 Cueing to lift chest as pt has tendency to look down at floor, manual cueing to coordinate rock, cues for eye boost (pt had some difficulty coordinating where to look in each movement)  gait training with cane, focusing on completing certain distance in limited # of steps Great improvement in step length- manual/verbal cueing for proper sentencing of cane     PATIENT  EDUCATION: Education details: provided sitting PWR moves HEP handout ; discussed PWR moves class and pt's concern about getting up from the floor  Person educated: Patient Education method: Explanation, Demonstration, Tactile cues, Verbal cues, and Handouts Education comprehension: verbalized understanding and returned demonstration     Access Code: [email or web address] URL: [email or web address] Date: 02/20/2023 Prepared by: [facility] - Outpatient  Rehab - [facility]  Exercises - Seated Hamstring Stretch  - 1-2 x daily - 7 x weekly - 1 sets - 3 reps - 30 sec hold - Standing Gastroc Stretch at Counter  - 1-2 x daily - 7 x weekly - 1 sets - 3 reps - 30sec hold - Seated Active Hip Flexion  - 1 x daily - 5 x weekly - 2 sets - 10 reps - Sit to Stand with Armchair  - 1 x daily - 7 x weekly - 3 sets - 5 reps - Mini Squat with Counter Support  - 1 x daily - 5 x weekly - 3 sets - 10 reps - Alternating Step Backward with Support  - 1 x daily - 5 x weekly - 2 sets - 10 reps - Side Stepping with Counter Support  - 1 x daily - 5 x weekly - 2 sets - 10 reps - Standing Quarter Turn with Counter Support  - 1-2 x daily - 7 x weekly - 1 sets - 3-5 reps    ----------------------------------------------------------------- Note: Objective measures below were completed at Evaluation unless otherwise noted.  DIAGNOSTIC FINDINGS: NA per this episode  COGNITION: Overall cognitive status:  hx of MCI per MD note; WFL for session today   SENSATION: WFL  COORDINATION: Slowed RAM with toe tapping  EDEMA:  1+pitting edema BLEs MD is aware-has recommended compression stocking  MUSCLE TONE: Mild increase LLE tone  POSTURE: rounded shoulders, forward head, and posterior pelvic tilt  LOWER EXTREMITY ROM:     Active  Right Eval  Left Eval  Hip flexion    Hip extension    Hip abduction    Hip adduction    Hip internal rotation    Hip external rotation    Knee flexion    Knee extension     Ankle dorsiflexion 10 8  Ankle plantarflexion    Ankle inversion    Ankle eversion     (Blank rows = not tested)    TRANSFERS: Assistive device utilized: None  Sit to stand: CGA and without use of BUEs, he has posterior lean, not as prominent with use of BUEs Stand to sit: CGA   GAIT: Gait pattern: step through pattern, decreased step length- Right, decreased step length- Left, shuffling, trunk flexed, and narrow BOS Distance walked: 50 ft x 2 Assistive device utilized: None Level of assistance: SBA  FUNCTIONAL TESTS:  5 times sit to stand: 18 sec with 3 of 5 episodes of posterior lean Timed up and go (TUG): 12.41 sec 10 meter walk test: 10 sec  MiniBESTest:  14/28 TUG cognitive:  23.25 esc TUG manual:  12.94 sec 360 turn R:  13 steps, 4.66 sec 360 turn L:  12 steps, 4.44 sec 56M back:  7.34 sec, small, quick, narrow BOS steps       GOALS: Goals reviewed with patient? Yes  SHORT TERM GOALS: Target date: 01/23/2023  Pt will be independent with HEP for improved balance, strength, gait. Baseline: Goal status: MET 01/22/2023  2.  Pt will improve 5x sit<>stand to less than or equal to 15 sec with no posterior lean to demonstrate improved functional strength and transfer efficiency. Baseline: 18 sec, 3 of 5 reps posterior lean>15.13 sec with UE support at chair, no posterior lean Goal status: MET 01/22/2023  3.  Pt will improve TUG cognitive score to less than or equal to 18 sec for decreased fall risk. Baseline: 23.45 sec>15.53 sec Goal status: MET 01/22/2023   LONG TERM GOALS: Target date: 02/20/2023>03/27/2023  Pt will be independent with progression of HEP for improved balance, strength, gait. Baseline: update given 02/20/2023 Goal status: IN PROGRESS, 12/202024  2.  Pt will improve 5x sit<>stand to less than or equal to 12.5 sec and no posterior lean to demonstrate improved functional strength and transfer efficiency. Baseline: 22.44 sec with initial  posterior lean (hands on knees) Goal status: NOT YET MET, 02/18/2023; IN PROGRESS  3.  Pt will improve MiniBESTest score to at least 19/28 to decrease fall risk. Baseline: 14/28>17/28 02/2023 Goal status: IN PROGRESS, 02/20/2023  4.  Pt will perform 360 turn to R and L in 8 steps or less for improved balance. Baseline: 12-13 steps; 12-14 steps with supervision 02/18/2023 Goal status: NOT YET MET, IN PROGRESS 02/18/2023  5.  Pt will perform posterior push and release test in 2 steps or less, for improved posterior balance recovery. Baseline: multiple small steps, would fall if unaided 02/20/2023 Goal status: IN PROGRESS, 02/20/2023  6.  Pt will verbalize understanding of local Parkinson's disease community resources, including optimal fitness program. Baseline:  Goal status: IN PROGRESS  ASSESSMENT:  CLINICAL IMPRESSION: Patient arrived to session without new complaints. Initiated gait training on TM at slow pace with focus on large step length and use of visual cues to correct posture. Sitting PWR moves also incorporated eye boost as pt with kyphotic posture throughout session. Updated HEP with sitting PWR moves and discussed possibility of joining PWR moves classes. Patient continues to struggle sequencing his quad tip cane. No complaints at end of  session.   OBJECTIVE IMPAIRMENTS: Abnormal gait, decreased balance, decreased knowledge of use of DME, decreased mobility, difficulty walking, decreased ROM, decreased strength, impaired flexibility, and postural dysfunction.   ACTIVITY LIMITATIONS: standing, squatting, transfers, and locomotion level  PARTICIPATION LIMITATIONS: community activity and yard work  PERSONAL FACTORS: 3+ comorbidities: see above  are also affecting patient's functional outcome.   REHAB POTENTIAL: Good  CLINICAL DECISION MAKING: Evolving/moderate complexity  EVALUATION COMPLEXITY: Moderate  PLAN:  PT FREQUENCY: 2x/week  PT DURATION: 8 weeks plus  eval  PLANNED INTERVENTIONS: 97110-Therapeutic exercises, 97530- Therapeutic activity, [id number]- Neuromuscular re-education, 97535- Self Care, [id number]- Manual therapy, [phone]- Gait training, Patient/Family education, Balance training, and DME instructions  PLAN FOR NEXT SESSION: patient will need re-cert on 0/98/11 to continue POC since he cancelled his 03/26/23 appointment gait training with turns and cane.  Continue to address balance/retropulsion, turns, backwards and side balance recovery.  Work on sit to stand, squats, balance strategies, compliant surface work to counteract posterior lean/LOB.       Baldemar Friday, PT, DPT 03/23/23 10:58 AM  [facility] at [facility] [address] [place], [address] [place], [place] [address] Phone # [phone] Fax # [phone]

## 2023-03-23 ENCOUNTER — Ambulatory Visit: Payer: Medicare Other | Admitting: Physical Therapy

## 2023-03-23 ENCOUNTER — Encounter: Payer: Medicare Other | Admitting: Occupational Therapy

## 2023-03-23 ENCOUNTER — Encounter: Payer: Self-pay | Admitting: Physical Therapy

## 2023-03-23 ENCOUNTER — Ambulatory Visit: Payer: Medicare Other | Admitting: Occupational Therapy

## 2023-03-23 DIAGNOSIS — R29818 Other symptoms and signs involving the nervous system: Secondary | ICD-10-CM

## 2023-03-23 DIAGNOSIS — R2689 Other abnormalities of gait and mobility: Secondary | ICD-10-CM

## 2023-03-23 DIAGNOSIS — R2681 Unsteadiness on feet: Secondary | ICD-10-CM | POA: Diagnosis not present

## 2023-03-23 DIAGNOSIS — R278 Other lack of coordination: Secondary | ICD-10-CM | POA: Diagnosis not present

## 2023-03-23 DIAGNOSIS — M6281 Muscle weakness (generalized): Secondary | ICD-10-CM | POA: Diagnosis not present

## 2023-03-23 DIAGNOSIS — R293 Abnormal posture: Secondary | ICD-10-CM | POA: Diagnosis not present

## 2023-03-23 NOTE — Therapy (Signed)
OUTPATIENT OCCUPATIONAL THERAPY PARKINSON'S  Treatment Note   Patient Name: Cody Dickerson MRN: [id number] DOB:[birth year], 73 y.o., male Today's Date: 03/23/2023  PCP: Daisy Floro, MD  REFERRING PROVIDER: Tat, Octaviano Batty, DO   END OF SESSION:  OT End of Session - 03/23/23 1150     Visit Number 11    Number of Visits 24    Date for OT Re-Evaluation 05/01/23    Authorization Type Medicare A&B    Progress Note Due on Visit 10    OT Start Time 0930    OT Stop Time 1013    OT Time Calculation (min) 43 min    Activity Tolerance Patient tolerated treatment well    Behavior During Therapy WFL for tasks assessed/performed                       Past Medical History:  Diagnosis Date   Balance problem    Gout    Hypercholesterolemia    Hypertension    Parkinson's disease (HCC)    Small bowel obstruction (HCC)    Past Surgical History:  Procedure Laterality Date   BOWEL BLOCKAGE  05/2019   IR ANGIO INTRA EXTRACRAN SEL COM CAROTID INNOMINATE BILAT MOD SED  10/03/2021   IR ANGIO VERTEBRAL SEL VERTEBRAL UNI R MOD SED  10/03/2021   IR RADIOLOGIST EVAL & MGMT  08/19/2021   IR US GUIDE VASC ACCESS RIGHT  10/04/2021   Patient Active Problem List   Diagnosis Date Noted   Pain due to onychomycosis of toenails of both feet 09/03/2022   Porokeratosis 06/04/2022   Parkinson's disease (HCC) 02/06/2021   SBO (small bowel obstruction) (HCC) 07/06/2019    ONSET DATE: 12/15/22 (referral date),  Pt reports Parkinson's dx approx. 3 years ago (approx. 2021)  REFERRING DIAG:   R29.3 (ICD-10-CM) - Abnormal posture  R27.8 (ICD-10-CM) - Other lack of coordination  R25.1 (ICD-10-CM) - Tremor  M62.81 (ICD-10-CM) - Muscle weakness (generalized)  R29.898 (ICD-10-CM) - Other symptoms and signs involving the musculoskeletal system  R26.81 (ICD-10-CM) - Unsteadiness on feet  R26.89 (ICD-10-CM) - Other abnormalities of gait and mobility    THERAPY DIAG:  Muscle weakness  (generalized)  Other symptoms and signs involving the nervous system  Abnormal posture  Other lack of coordination  Rationale for Evaluation and Treatment: Rehabilitation  SUBJECTIVE:   SUBJECTIVE STATEMENT: Pt reports some minimal back pain from shoveling snow, but not impacting activity. Reports wife cues him for upright posture at home.  Pt accompanied by: self  PERTINENT HISTORY: 08/12/22 MD Progress Notes and 08/18/22 DO Progress Notes: Parkinson's disease, left distal supraclinoid ICA aneurysm (aneurysm size stable), MCI, HTN, gout, hypercholesterolemia  PRECAUTIONS: Fall, posterior lean when standing/walking   WEIGHT BEARING RESTRICTIONS: No  PAIN:  Are you having pain? Yes 1/10 backpain from shoveling snow a few weeks ago  FALLS: Has patient fallen in last 6 months? Yes. Number of falls Pt reports approx. 4 falls. Pt reports picking up sticks in yard when 2 falls occurred.  Per 12/11/22 PT Screen: Pt reports "Balance is bad". Have had several falls in the yard.   LIVING ENVIRONMENT: Lives with: lives with their spouse Lives in: House/apartment Stairs: Yes: External: 3 steps; on left going up Has following equipment at home: Grab bars  PLOF: Independent with basic ADLs, pt currently drives  PATIENT GOALS: "to do learn how to do things better."  OBJECTIVE:  Note: Objective measures were completed at Evaluation unless otherwise noted.  HAND DOMINANCE: Right  ADLs: Overall ADLs:  Transfers/ambulation related to ADLs: Eating: ind, pt reports slower and more meticulous to get items on fork. No difficulty with cutting and serving food. Grooming: Pt reports shaving with standard razor blade is slower. Pt has electric shaver available though pt has not attempted to use it. UB Dressing: Pt reports "Not as good putting jacket on as I used to be." Pt currently using cape method to don jacket, a strategy learned in previous OT sessions. Increased difficulty with buttons: "I  have more trouble with buttoning buttons placed in front of me than when a shirt is on me." Pt reports taking extra time to work out zippers if zippers get stuck. LB Dressing: "Getting my pant legs on and off are a little slower but it's acceptable." Toileting: Ind Bathing: Ind with someone else in the home for safety Tub Shower transfers: ind Equipment: Grab bars, Sports administrator, and tub/shower  IADLs: Shopping: spouse completes grocery shopping, pt helps out with carrying heavy water Light housekeeping: pt folds laundry without difficulty Meal Prep: Ind with cutting, pt completes charcoal grilling tasks and pt reports being careful and walking slow around grill for safety. 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 Handwriting: Pt reports handwriting is slower than it used to be, and even slower if he focuses on writing legibly.   Pt wrote "Whales live in the ocean" in 18 seconds. Increased time required and approx. 66% legible.  MOBILITY STATUS: Ind without A/E. PT reported to OT that pt demo's significant posterior lean when standing and walking. Pt reports "imbalance tends to go backwards."  POSTURE COMMENTS:  rounded shoulders and forward head  ACTIVITY TOLERANCE: Activity tolerance: Pt reports increased fatigue and sleepiness during the day. Pt reports sleeping well at night. Pt reports increased fatigue may be d/t medication though pt is unsure.  FUNCTIONAL OUTCOME MEASURES: 03/19/23 Fastening/unfastening 3 buttons: 2:23 (however last button required 55 seconds to unfasten). OT noted mild tremors of BUE when completing task.  Physical performance test: PPT#2 (simulated eating) 15.22 seconds. OT noted mild tremors of RUE when completing task.  PPT#4 (donning/doffing jacket): 22.31 sec   Fastening/unfastening 3 buttons: 98 seconds. OT noted mild  tremors of BUE when completing task.  Physical performance test: PPT#2 (simulated eating) 17 seconds. OT noted mild tremors of RUE when completing task.  PPT#4 (donning/doffing jacket): 18 seconds. With gait belt. OT noted mild tremors of BUE during task.  COORDINATION: 03/19/23 9 Hole Peg test: Right: 46.56 sec; Left: 49.85 sec Box and Blocks:  Right 37 blocks, Left 40 blocks  01/22/23: 9 Hole Peg test: Right: 43.5 sec; Left: 41.25 sec Box and Blocks:  Right 38 blocks, Left 38 blocks  EVAL: 9 Hole Peg test: Right: 36 sec; Left: 36 sec Box and Blocks:  Right 34 blocks, Left 41 blocks  UE ROM:    BUE shoulders flex/ABD - WFL.  BUE elbow flex/ext - WFL with v/c to fully ext. R elbow ext slower than L elbow ext.  Wrist flex/ext - WFL, v/c to fully ext.  Digits flex/ext - WFL, v/c to fully ext thumbs with slightly decreased AROM of B thumbs noted.  Note: Pt demo'd decreased speed to attain full AROM of BUE.   UE MMT:   Not tested  SENSATION: WFL Pt reports no change.  COGNITION: Overall cognitive status: Within functional limits for tasks assessed. Pt  reports more difficulty remembering day of the week, taking medication at 4 PM specifically. Pt reports using calendar to help with memory.   OBSERVATIONS: Pt was pleasant and agreeable. Pt recognized difficulty with ADL/IADL tasks and described current level of functioning well. OT noted Bradykinesia and mild tremors with FM tasks . Pt donned reading glasses for handwriting task. Pt with good recall of techniques and strategies from previous OT session, motivated to participate in [facility] tasks.   TODAY'S TREATMENT:                                                                      DATE:  03/23/23 GMC/FMC: pt engaged preparatory activity on UBE for 5 mins on level 1 as this is a new task prior to FMC/GMC. Pt enaging in BIG movements for chest openers, shoulder rolls (difficulty sequencing and OT providing tactile cues),  diagonals, all with cues for BIG movements. Pt engaging in crossing midline and translation of small object, "around the worlds" for simulating dressing activity 1x15 each direction. Handwriting: engaged in writing task for familiar words and names focusing on consistency, legibility, and sizing with approx 75% legibility.    03/19/23 Handwriting: engaged in writing with focus on legibility, sizing, and pacing.  Pt wrote sample check with 75% legibility and min micrographia when writing in cursive.  Pt reports "trying to eliminate" the small sizing when writing, however still with progressive micrographia with increased writing. Large amplitude: engaged in finger flicks and forward reaching with forearm reach with elbow and wrist extension and hand opening.  Completed x10 with min cues and demonstration for increased amplitude.  Engaged in reaching outside BOS in sitting to facilitate increased amplitude and weight shifting as needed to carry over to ADLs.      02/03/23 Large amplitude: engaged in thumb opposition with finger extension and finger flicks with focus on full finger extension and opening hand prior to engaging in [facility] tasks.  OT encouraging pt to exaggerate opening throughout coordination tasks.   Coins: completed with R and L hand, engaged in picking up coins, stacking and unstacking coins, and translating from palm to finger tips to place into coin slot with focus on large amplitude, purposeful movements.  Pen tricks: engaged in rotation, flip, translation, and shift with pen. OT providing demonstration and verbal cues to facilitate increased carryover of technique. Pt completing rotation and flip with focus on amplitude and education on intrinsic hand muscles and coordination to carryover to handwriting.   Handwriting: engaged in sample handwriting with print and cursive.  Pt with improved legibilty with print, therefore OT encouraging print and taking a break between longer words to  "reset" hand placement.  Reviewed use of lined paper and focus on writing large.   Pt with mild improvements with repetition and focus on pacing and size.   01/28/23 Large amplitude: engaged in finger flicks with focus on full finger extension and opening hand prior to engaging in [facility] tasks.  OT encouraging pt to exaggerate opening throughout coordination tasks.   Pegs: engaged in small peg board pattern replication with R hand with focus on large amplitude opening of hand to facilitate increased ease when picking up pegs.  OT providing min cues initially for exaggerated hand opening  with improved carryover with repetition.  Removed pegs one at a time, placing multiple into palm and then translating one by one to finger tips to place in container.  Pt with increased difficulty when removing pegs from peg board, however with good technique with translation of pegs from palm to finger tips.   Buttons: completed in 83 seconds with increased time/effort and 79 seconds.  Pt continues to demonstrate difficulty with fastening and unfastening buttons, greater difficulty with fastening.   Coins: modifying card exercises to complete with coins.  Pt picking up and rotating coins large with focus on rotation in finger tips as well as forearm rotation, engaged in translation of coins from palm to finger tips.  Pt with good technique with use of coins this session.    PATIENT EDUCATION: Education details:  PD specific education for large amplitude/coordination Person educated: Patient Education method: Explanation and Handouts Education comprehension: verbalized understanding  HOME EXERCISE PROGRAM: 01/20/23 - coordination exercises (see pt instructions)  01/15/23 - bag exercises (see pt instructions)  01/08/23 - large amplitude hands (see pt instructions)  GOALS: Goals reviewed with patient? Yes  GOALS:  SHORT TERM GOALS: Target date: 01/23/23    Pt will be independent with PD specific  HEP. Baseline: not yet initiated Goal status: MET - completing large amplitude, bag exercises, and coordination HEP on 01/22/23  2.  Pt will verbalize understanding of adapted strategies to maximize safety and independence with ADLs/IADLs. Baseline: not yet initiated Goal status: MET - 01/22/23 3.  Pt will write a simple sentence with at least 75% legibility and complete the sentence in 15 seconds or less.  Baseline: 66% legibility, 18 seconds for simple sentence Goal status: PARTIALLY MET - required increased time (26 sec) however improved legibility to 75% on 01/22/23  4.  Pt will demonstrate improved fine motor coordination for ADLs as evidenced by completing 9 hole peg test score for BUE in 30 seconds or less. Baseline: 9 Hole Peg test: Right: 36 sec; Left: 36 sec Goal status: NOT MET - Right: 43.5 sec; Left: 41.25 sec on 01/22/23   5.  Pt will be able to place at least 45 blocks with BUE hand with completion of Box and Blocks test. Baseline: Right 34 blocks, Left 41 blocks Goal status: NOT MET - Completed R: 38 and L: 38 on 01/22/23    LONG TERM GOALS: Target date: 02/20/23    Pt will verbalize understanding of ways to prevent future PD related complications and PD community resources. Baseline: not yet initiated Goal status: IN PROGRESS  2.  Pt will write a simulated/sample check with at least 85% legibility while maintaining steady speed. Baseline: 66% legibility for simple sentence, 18 seconds for simple sentence  Goal status: NOT MET - 75% legibility with slower pace - 30 seconds for simple sentence on 03/19/23  5.  Pt will demonstrate improved ease with fastening buttons as evidenced by decreasing 3 button/unbutton time to 80 seconds or less. Baseline: 98 seconds Goal status: NOT MET - 2:23 (last button required 55 seconds to unfasten) on 03/19/23  3.  Pt will verbalize understanding of ways to keep thinking skills sharp and ways to compensate for STM changes in the  future. Baseline: not yet initiated Goal status: IN PROGRESS  4.  Pt will demonstrate improved ease with feeding as evidenced by decreasing PPT#2 to 15 seconds or less. Baseline: PPT #2: 17 secs, pt reports difficulty placing food on fork Goal status: MET - 15 secs on 03/19/23  5.  Pt will demonstrate increased ease with dressing as evidenced by decreasing PPT#4 (don/ doff jacket) to 15 secs or less. Baseline: PPT #4: 18 secs Goal status: NOT MET 22.31 sec on 03/19/23   NEW LONG TERM GOALS: Target date: 04/30/22    Pt will verbalize understanding of ways to prevent future PD related complications and PD community resources. Baseline: not yet initiated Goal status: IN PROGRESS  2.  Pt will write a simulated/sample check with at least 85% legibility while maintaining steady speed. Baseline: 66% legibility for simple sentence, 18 seconds for simple sentence   03/19/23 - 75% legibility, 30 seconds for simple sentence Goal status: IN PROGRESS  3.  Pt will demonstrate improved ease with fastening buttons as evidenced by decreasing 3 button/unbutton time to 80 seconds or less. Baseline: 98 seconds 03/19/23 - 2:23 (last button required 55 seconds to unfasten) Goal status: IN PROGRESS  4.  Pt will verbalize understanding of ways to keep thinking skills sharp and ways to compensate for STM changes in the future. Baseline: not yet initiated Goal status: IN PROGRESS  5.  Pt will demonstrate increased ease with dressing as evidenced by decreasing PPT#4 (don/ doff jacket) to 15 secs or less. Baseline: PPT #4: 18 secs 03/19/23 - 22:31 sec Goal status: IN PROGRESS    ASSESSMENT:  CLINICAL IMPRESSION: Pt continues to demonstrate postural deficits consistent with PD, micrographia, difficulty with [facility] activities, in hand manipulation, and requiring increased time for these tasks.  Pt will benefit from continued skilled occupational therapy services to address coordination, ROM, balance, GM/FM  control, safety awareness, introduction of compensatory strategies/AE prn, and implementation of an HEP to improve participation and safety during ADLs, IADLs, and quality of life.   PERFORMANCE DEFICITS: in functional skills including ADLs, IADLs, coordination, dexterity, proprioception, ROM, strength, Fine motor control, Gross motor control, mobility, balance, body mechanics, endurance, and UE functional use, cognitive skills including energy/drive and memory, and psychosocial skills including environmental adaptation.   IMPAIRMENTS: are limiting patient from ADLs, IADLs, leisure, and social participation.     PLAN:  OT FREQUENCY: 2x/week  OT DURATION: 6 weeks (dates extended to allow for scheduling)  PLANNED INTERVENTIONS: 97168 OT Re-evaluation, 97535 self care/ADL training, 16109 therapeutic exercise, 97530 therapeutic activity, 97112 neuromuscular re-education, 97140 manual therapy, 97035 ultrasound, 97018 paraffin, 60454 fluidotherapy, 97010 moist heat, 97010 cryotherapy, 97032 electrical stimulation (manual), 97014 electrical stimulation unattended, 97760 Orthotics management and training, 09811 Splinting (initial encounter), [id number] Subsequent splinting/medication, passive range of motion, functional mobility training, energy conservation, patient/family education, and DME and/or AE instructions  RECOMMENDED OTHER SERVICES: PT eval already completed on 12/24/22.  CONSULTED AND AGREED WITH PLAN OF CARE: Patient  PLAN FOR NEXT SESSION:   Large amplitude movements  Bag exercises for UB and LB dressing  FM tasks - buttons, other FMC tasks, handwriting  Donning/doffing jacket - review strategies (e.g. "cape method")   Erasmo Score, OT 03/23/2023, 11:57 AM

## 2023-03-25 ENCOUNTER — Ambulatory Visit: Payer: Medicare Other | Admitting: Physical Therapy

## 2023-03-25 ENCOUNTER — Encounter: Payer: Medicare Other | Admitting: Occupational Therapy

## 2023-03-26 ENCOUNTER — Ambulatory Visit: Payer: Medicare Other | Admitting: Physical Therapy

## 2023-03-30 ENCOUNTER — Encounter: Payer: Self-pay | Admitting: Physical Therapy

## 2023-03-30 ENCOUNTER — Ambulatory Visit: Payer: Medicare Other | Admitting: Occupational Therapy

## 2023-03-30 ENCOUNTER — Ambulatory Visit: Payer: Medicare Other | Admitting: Physical Therapy

## 2023-03-30 DIAGNOSIS — R29818 Other symptoms and signs involving the nervous system: Secondary | ICD-10-CM

## 2023-03-30 DIAGNOSIS — R2689 Other abnormalities of gait and mobility: Secondary | ICD-10-CM | POA: Diagnosis not present

## 2023-03-30 DIAGNOSIS — R2681 Unsteadiness on feet: Secondary | ICD-10-CM

## 2023-03-30 DIAGNOSIS — R293 Abnormal posture: Secondary | ICD-10-CM

## 2023-03-30 DIAGNOSIS — M6281 Muscle weakness (generalized): Secondary | ICD-10-CM | POA: Diagnosis not present

## 2023-03-30 DIAGNOSIS — R278 Other lack of coordination: Secondary | ICD-10-CM

## 2023-03-30 NOTE — Therapy (Signed)
OUTPATIENT OCCUPATIONAL THERAPY PARKINSON'S  Treatment Note   Patient Name: Cody Dickerson MRN: [id number] DOB:[birth year], 73 y.o., male Today's Date: 03/30/2023  PCP: Daisy Floro, MD  REFERRING PROVIDER: Tat, Octaviano Batty, DO   END OF SESSION:  OT End of Session - 03/30/23 1551     Visit Number 12    Number of Visits 24    Date for OT Re-Evaluation 05/01/23    Authorization Type Medicare A&B    Progress Note Due on Visit 10    OT Start Time 1318    OT Stop Time 1400    OT Time Calculation (min) 42 min    Activity Tolerance Patient tolerated treatment well    Behavior During Therapy WFL for tasks assessed/performed                        Past Medical History:  Diagnosis Date   Balance problem    Gout    Hypercholesterolemia    Hypertension    Parkinson's disease (HCC)    Small bowel obstruction (HCC)    Past Surgical History:  Procedure Laterality Date   BOWEL BLOCKAGE  05/2019   IR ANGIO INTRA EXTRACRAN SEL COM CAROTID INNOMINATE BILAT MOD SED  10/03/2021   IR ANGIO VERTEBRAL SEL VERTEBRAL UNI R MOD SED  10/03/2021   IR RADIOLOGIST EVAL & MGMT  08/19/2021   IR US GUIDE VASC ACCESS RIGHT  10/04/2021   Patient Active Problem List   Diagnosis Date Noted   Pain due to onychomycosis of toenails of both feet 09/03/2022   Porokeratosis 06/04/2022   Parkinson's disease (HCC) 02/06/2021   SBO (small bowel obstruction) (HCC) 07/06/2019    ONSET DATE: 12/15/22 (referral date),  Pt reports Parkinson's dx approx. 3 years ago (approx. 2021)  REFERRING DIAG:   R29.3 (ICD-10-CM) - Abnormal posture  R27.8 (ICD-10-CM) - Other lack of coordination  R25.1 (ICD-10-CM) - Tremor  M62.81 (ICD-10-CM) - Muscle weakness (generalized)  R29.898 (ICD-10-CM) - Other symptoms and signs involving the musculoskeletal system  R26.81 (ICD-10-CM) - Unsteadiness on feet  R26.89 (ICD-10-CM) - Other abnormalities of gait and mobility    THERAPY DIAG:  Muscle weakness  (generalized)  Other symptoms and signs involving the nervous system  Other lack of coordination  Unsteadiness on feet  Rationale for Evaluation and Treatment: Rehabilitation  SUBJECTIVE:   SUBJECTIVE STATEMENT: Pt reports "nothing new".  Pt accompanied by: self  PERTINENT HISTORY: 08/12/22 MD Progress Notes and 08/18/22 DO Progress Notes: Parkinson's disease, left distal supraclinoid ICA aneurysm (aneurysm size stable), MCI, HTN, gout, hypercholesterolemia  PRECAUTIONS: Fall, posterior lean when standing/walking   WEIGHT BEARING RESTRICTIONS: No  PAIN:  Are you having pain? Yes 1/10 backpain from shoveling snow a few weeks ago  FALLS: Has patient fallen in last 6 months? Yes. Number of falls Pt reports approx. 4 falls. Pt reports picking up sticks in yard when 2 falls occurred.  Per 12/11/22 PT Screen: Pt reports "Balance is bad". Have had several falls in the yard.   LIVING ENVIRONMENT: Lives with: lives with their spouse Lives in: House/apartment Stairs: Yes: External: 3 steps; on left going up Has following equipment at home: Grab bars  PLOF: Independent with basic ADLs, pt currently drives  PATIENT GOALS: "to do learn how to do things better."  OBJECTIVE:  Note: Objective measures were completed at Evaluation unless otherwise noted.  HAND DOMINANCE: Right  ADLs: Overall ADLs:  Transfers/ambulation related to ADLs: Eating: ind, pt reports  slower and more meticulous to get items on fork. No difficulty with cutting and serving food. Grooming: Pt reports shaving with standard razor blade is slower. Pt has electric shaver available though pt has not attempted to use it. UB Dressing: Pt reports "Not as good putting jacket on as I used to be." Pt currently using cape method to don jacket, a strategy learned in previous OT sessions. Increased difficulty with buttons: "I have more trouble with buttoning buttons placed in front of me than when a shirt is on me." Pt reports  taking extra time to work out zippers if zippers get stuck. LB Dressing: "Getting my pant legs on and off are a little slower but it's acceptable." Toileting: Ind Bathing: Ind with someone else in the home for safety Tub Shower transfers: ind Equipment: Grab bars, Sports administrator, and tub/shower  IADLs: Shopping: spouse completes grocery shopping, pt helps out with carrying heavy water Light housekeeping: pt folds laundry without difficulty Meal Prep: Ind with cutting, pt completes charcoal grilling tasks and pt reports being careful and walking slow around grill for safety. 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 Handwriting: Pt reports handwriting is slower than it used to be, and even slower if he focuses on writing legibly.   Pt wrote "Whales live in the ocean" in 18 seconds. Increased time required and approx. 66% legible.  MOBILITY STATUS: Ind without A/E. PT reported to OT that pt demo's significant posterior lean when standing and walking. Pt reports "imbalance tends to go backwards."  POSTURE COMMENTS:  rounded shoulders and forward head  ACTIVITY TOLERANCE: Activity tolerance: Pt reports increased fatigue and sleepiness during the day. Pt reports sleeping well at night. Pt reports increased fatigue may be d/t medication though pt is unsure.  FUNCTIONAL OUTCOME MEASURES: 03/19/23 Fastening/unfastening 3 buttons: 2:23 (however last button required 55 seconds to unfasten). OT noted mild tremors of BUE when completing task.  Physical performance test: PPT#2 (simulated eating) 15.22 seconds. OT noted mild tremors of RUE when completing task.  PPT#4 (donning/doffing jacket): 22.31 sec   Fastening/unfastening 3 buttons: 98 seconds. OT noted mild tremors of BUE when completing task.  Physical performance test: PPT#2 (simulated eating) 17 seconds.  OT noted mild tremors of RUE when completing task.  PPT#4 (donning/doffing jacket): 18 seconds. With gait belt. OT noted mild tremors of BUE during task.  COORDINATION: 03/19/23 9 Hole Peg test: Right: 46.56 sec; Left: 49.85 sec Box and Blocks:  Right 37 blocks, Left 40 blocks  01/22/23: 9 Hole Peg test: Right: 43.5 sec; Left: 41.25 sec Box and Blocks:  Right 38 blocks, Left 38 blocks  EVAL: 9 Hole Peg test: Right: 36 sec; Left: 36 sec Box and Blocks:  Right 34 blocks, Left 41 blocks  UE ROM:    BUE shoulders flex/ABD - WFL.  BUE elbow flex/ext - WFL with v/c to fully ext. R elbow ext slower than L elbow ext.  Wrist flex/ext - WFL, v/c to fully ext.  Digits flex/ext - WFL, v/c to fully ext thumbs with slightly decreased AROM of B thumbs noted.  Note: Pt demo'd decreased speed to attain full AROM of BUE.   UE MMT:   Not tested  SENSATION: WFL Pt reports no change.  COGNITION: Overall cognitive status: Within functional limits for tasks assessed. Pt reports more difficulty remembering day of the week, taking medication at 4 PM specifically. Pt reports  using calendar to help with memory.   OBSERVATIONS: Pt was pleasant and agreeable. Pt recognized difficulty with ADL/IADL tasks and described current level of functioning well. OT noted Bradykinesia and mild tremors with FM tasks . Pt donned reading glasses for handwriting task. Pt with good recall of techniques and strategies from previous OT session, motivated to participate in [facility] tasks.   TODAY'S TREATMENT:                                                                      DATE:  03/30/23 Large amplitude: pt completing finger flicks and thumb opposition with focus on big, intentional movements prior to engaging in [facility] tasks. Peg board: engaged in small peg board pattern replication with focus on intentional movements when picking up and placing pegs into peg board.  Pt with some tremors, most prevalent on L >  R. Coordination: engaged in picking up and placing coins into coin slot one at a time, progressing to completing in-hand manipulation and translation of coins from palm to finger tips to place into coin slot.   Buttons: massed practice with buttoning/unbuttoning.  Pt reports wearing button up shirt about 1x/week, reporting increased difficulty in the mornings compared to later in the day.  OT educating on "push and pull" technique and hand placement with pt utilizing intermittently.  Pt completed 3 button/unbutton in 50.25 sec.  OT also educating on keeping bottom buttons fastened and donning/doffing shirt in pull over shirt manner to decrease need to manage as many buttons.      03/23/23 GMC/FMC: pt engaged preparatory activity on UBE for 5 mins on level 1 as this is a new task prior to FMC/GMC. Pt enaging in BIG movements for chest openers, shoulder rolls (difficulty sequencing and OT providing tactile cues), diagonals, all with cues for BIG movements. Pt engaging in crossing midline and translation of small object, "around the worlds" for simulating dressing activity 1x15 each direction. Handwriting: engaged in writing task for familiar words and names focusing on consistency, legibility, and sizing with approx 75% legibility.     03/19/23 Handwriting: engaged in writing with focus on legibility, sizing, and pacing.  Pt wrote sample check with 75% legibility and min micrographia when writing in cursive.  Pt reports "trying to eliminate" the small sizing when writing, however still with progressive micrographia with increased writing. Large amplitude: engaged in finger flicks and forward reaching with forearm reach with elbow and wrist extension and hand opening.  Completed x10 with min cues and demonstration for increased amplitude.  Engaged in reaching outside BOS in sitting to facilitate increased amplitude and weight shifting as needed to carry over to ADLs.    PATIENT EDUCATION: Education  details:  PD specific education for large amplitude/coordination Person educated: Patient Education method: Explanation and Handouts Education comprehension: verbalized understanding  HOME EXERCISE PROGRAM: 01/20/23 - coordination exercises (see pt instructions)  01/15/23 - bag exercises (see pt instructions)  01/08/23 - large amplitude hands (see pt instructions)  GOALS: Goals reviewed with patient? Yes  GOALS:  SHORT TERM GOALS: Target date: 01/23/23    Pt will be independent with PD specific HEP. Baseline: not yet initiated Goal status: MET - completing large amplitude, bag exercises, and coordination HEP on 01/22/23  2.  Pt will verbalize understanding of adapted strategies to maximize safety and independence with ADLs/IADLs. Baseline: not yet initiated Goal status: MET - 01/22/23 3.  Pt will write a simple sentence with at least 75% legibility and complete the sentence in 15 seconds or less.  Baseline: 66% legibility, 18 seconds for simple sentence Goal status: PARTIALLY MET - required increased time (26 sec) however improved legibility to 75% on 01/22/23  4.  Pt will demonstrate improved fine motor coordination for ADLs as evidenced by completing 9 hole peg test score for BUE in 30 seconds or less. Baseline: 9 Hole Peg test: Right: 36 sec; Left: 36 sec Goal status: NOT MET - Right: 43.5 sec; Left: 41.25 sec on 01/22/23   5.  Pt will be able to place at least 45 blocks with BUE hand with completion of Box and Blocks test. Baseline: Right 34 blocks, Left 41 blocks Goal status: NOT MET - Completed R: 38 and L: 38 on 01/22/23    NEW LONG TERM GOALS: Target date: 04/30/22    Pt will verbalize understanding of ways to prevent future PD related complications and PD community resources. Baseline: not yet initiated Goal status: IN PROGRESS  2.  Pt will write a simulated/sample check with at least 85% legibility while maintaining steady speed. Baseline: 66% legibility for  simple sentence, 18 seconds for simple sentence   03/19/23 - 75% legibility, 30 seconds for simple sentence Goal status: IN PROGRESS  3.  Pt will demonstrate improved ease with fastening buttons as evidenced by decreasing 3 button/unbutton time to 80 seconds or less. Baseline: 98 seconds 03/19/23 - 2:23 (last button required 55 seconds to unfasten) Goal status: IN PROGRESS  4.  Pt will verbalize understanding of ways to keep thinking skills sharp and ways to compensate for STM changes in the future. Baseline: not yet initiated Goal status: IN PROGRESS  5.  Pt will demonstrate increased ease with dressing as evidenced by decreasing PPT#4 (don/ doff jacket) to 15 secs or less. Baseline: PPT #4: 18 secs 03/19/23 - 22:31 sec Goal status: IN PROGRESS    ASSESSMENT:  CLINICAL IMPRESSION: Pt continues to demonstrate postural deficits consistent with PD, difficulty with [facility] activities, in hand manipulation, and requiring increased time for these tasks.  Pt demonstrating improvements with fastening buttons when educated on "push and pull" and intentional movements as well as after massed practice.  OT reiterating use of "large amplitude hands" prior to engaging in [facility] tasks to facilitate increased amplitude to mimic practice prior to completing singular task.  Pt will benefit from continued skilled occupational therapy services to address coordination, ROM, balance, GM/FM control, safety awareness, introduction of compensatory strategies/AE prn, and implementation of an HEP to improve participation and safety during ADLs, IADLs, and quality of life.   PERFORMANCE DEFICITS: in functional skills including ADLs, IADLs, coordination, dexterity, proprioception, ROM, strength, Fine motor control, Gross motor control, mobility, balance, body mechanics, endurance, and UE functional use, cognitive skills including energy/drive and memory, and psychosocial skills including environmental adaptation.    IMPAIRMENTS: are limiting patient from ADLs, IADLs, leisure, and social participation.     PLAN:  OT FREQUENCY: 2x/week  OT DURATION: 6 weeks (dates extended to allow for scheduling)  PLANNED INTERVENTIONS: 97168 OT Re-evaluation, 97535 self care/ADL training, 44010 therapeutic exercise, 97530 therapeutic activity, 97112 neuromuscular re-education, 97140 manual therapy, 97035 ultrasound, 97018 paraffin, 27253 fluidotherapy, 97010 moist heat, 97010 cryotherapy, 97032 electrical stimulation (manual), 97014 electrical stimulation unattended, 97760 Orthotics management and training,  16109 Splinting (initial encounter), [phone] Subsequent splinting/medication, passive range of motion, functional mobility training, energy conservation, patient/family education, and DME and/or AE instructions  RECOMMENDED OTHER SERVICES: PT eval already completed on 12/24/22.  CONSULTED AND AGREED WITH PLAN OF CARE: Patient  PLAN FOR NEXT SESSION:   Large amplitude movements  Bag exercises for UB and LB dressing  FM tasks - buttons, other FMC tasks, handwriting  Donning/doffing jacket - review strategies (e.g. "cape method")   Asaf Elmquist, OT 03/30/2023, 3:52 PM

## 2023-03-30 NOTE — Therapy (Signed)
OUTPATIENT PHYSICAL THERAPY NEURO TREATMENT NOTE/PROGRESS NOTE/RECERT   Patient Name: Cody Dickerson MRN: [id number] DOB:Oct 19, 1950, 73 y.o., male Today's Date: 03/31/2023   PCP: Daisy Floro, MD REFERRING PROVIDER: Vladimir Faster, DO    Progress Note Reporting Period 02/05/2024 to 03/31/2023  See note below for Objective Data and Assessment of Progress/Goals.        END OF SESSION:  PT End of Session - 03/30/23 1139     Visit Number 19    Number of Visits 25    Date for PT Re-Evaluation 04/24/23    Authorization Type Medicare/[employer]    Progress Note Due on Visit 29   (completed visit 19)   PT Start Time 1142    PT Stop Time 1232    PT Time Calculation (min) 50 min    Equipment Utilized During Treatment Gait belt    Activity Tolerance Patient tolerated treatment well    Behavior During Therapy WFL for tasks assessed/performed                           Past Medical History:  Diagnosis Date   Balance problem    Gout    Hypercholesterolemia    Hypertension    Parkinson's disease (HCC)    Small bowel obstruction (HCC)    Past Surgical History:  Procedure Laterality Date   BOWEL BLOCKAGE  05/2019   IR ANGIO INTRA EXTRACRAN SEL COM CAROTID INNOMINATE BILAT MOD SED  10/03/2021   IR ANGIO VERTEBRAL SEL VERTEBRAL UNI R MOD SED  10/03/2021   IR RADIOLOGIST EVAL & MGMT  08/19/2021   IR US GUIDE VASC ACCESS RIGHT  10/04/2021   Patient Active Problem List   Diagnosis Date Noted   Pain due to onychomycosis of toenails of both feet 09/03/2022   Porokeratosis 06/04/2022   Parkinson's disease (HCC) 02/06/2021   SBO (small bowel obstruction) (HCC) 07/06/2019    ONSET DATE: 12/15/2022 (MD referral)  REFERRING DIAG:  R29.3 (ICD-10-CM) - Abnormal posture  R27.8 (ICD-10-CM) - Other lack of coordination  R25.1 (ICD-10-CM) - Tremor  M62.81 (ICD-10-CM) - Muscle weakness (generalized)  R29.898 (ICD-10-CM) - Other symptoms and signs involving the musculoskeletal  system  R26.81 (ICD-10-CM) - Unsteadiness on feet  R26.89 (ICD-10-CM) - Other abnormalities of gait and mobility    THERAPY DIAG:  Muscle weakness (generalized)  Abnormal posture  Other symptoms and signs involving the nervous system  Other abnormalities of gait and mobility  Unsteadiness on feet  Rationale for Evaluation and Treatment: Rehabilitation  SUBJECTIVE:  SUBJECTIVE STATEMENT: Nothing new today.  Getting up and down is still a problem and getting better with cane; I like the support of the cane.  Pt accompanied by: self  PERTINENT HISTORY: Left distal supraclinoid ICA aneurysm, MCI  PAIN:  Are you having pain? Yes: NPRS scale: 0-2/10 Pain location: low back Pain description: aching Aggravating factors: shoveling snow, standing  Relieving factors: sitting  PRECAUTIONS: Fall  RED FLAGS: None   WEIGHT BEARING RESTRICTIONS: No  FALLS: Has patient fallen in last 6 months? Yes. Number of falls 3-4 -Able to get up on his own from falls, though slowly per report.    LIVING ENVIRONMENT: Lives with: lives with their spouse; pt has to help her at times Lives in: House/apartment Stairs: Yes: External: 3 steps; on right going up Has following equipment at home: None  PLOF: Independent; Spin class 1x/wk, has tried [employer]; walks several times per week; enjoys yard work  PATIENT GOALS: Pt's goals for therapy to get up better from chairs, have better balance.  OBJECTIVE:    TODAY'S TREATMENT: 03/30/2023 Activity Comments  FTSTS:  16.43 sec>  27.47 sec> Uses BUE support, good form, no posterior lean Hands on knees, posterior lean each time  MiniBESTest:  15/28 Decreased from 17/28  TUG:  13.06 sec TUG cognitive:  16.1 No cane             [facility] PT Assessment - 03/30/23 1158        Mini-BESTest   Sit To Stand Moderate: Comes to stand WITH use of hands on first attempt.    Rise to Toes Normal: Stable for 3 s with maximum height.    Stand on one leg (left) Moderate: < 20 s    Stand on one leg (right) Moderate: < 20 s    Stand on one leg - lowest score 1    Compensatory Stepping Correction - Forward No step, OR would fall if not caught, OR falls spontaneously.   no step   Compensatory Stepping Correction - Backward No step, OR would fall if not caught, OR falls spontaneously.    Compensatory Stepping Correction - Left Lateral Severe: Falls, or cannot step   no step   Compensatory Stepping Correction - Right Lateral Severe:  Falls, or cannot step   no step   Stepping Corredtion Lateral - lowest score 0    Stance - Feet together, eyes open, firm surface  Normal: 30s    Stance - Feet together, eyes closed, foam surface  Moderate: < 30s   17.19   Incline - Eyes Closed Moderate: Stands independently < 30s OR aligns with surface   7-9 sec   Change in Gait Speed Normal: Significantly changes walkling speed without imbalance    Walk with head turns - Horizontal Normal: performs head turns with no change in gait speed and good balance    Walk with pivot turns Moderate:Turns with feet close SLOW (>4 steps) with good balance.    Step over obstacles Moderate: Steps over box but touches box OR displays cautious behavior by slowing gait.    Timed UP & GO with Dual Task Moderate: Dual Task affects either counting OR walking (>10%) when compared to the TUG without Dual Task.    Mini-BEST total score 15              PATIENT EDUCATION: Education details: 03/31/2023-discussed progress towards goals, POC; discussed continued fall risk with balance measures and continuing POC to focus on education, strategies for  safety with gait; discussed [employer] exercise classes and provided handout for Tuesday 1pm [employer] class as this may be a safer option than [employer] class Person  educated: Patient Education method: Explanation, Demonstration, Tactile cues, Verbal cues, and Handouts Education comprehension: verbalized understanding and returned demonstration     Access Code: [email or web address] URL: [email or web address] Date: 02/20/2023 Prepared by: [facility] - Outpatient  Rehab - [facility]  Exercises - Seated Hamstring Stretch  - 1-2 x daily - 7 x weekly - 1 sets - 3 reps - 30 sec hold - Standing Gastroc Stretch at Counter  - 1-2 x daily - 7 x weekly - 1 sets - 3 reps - 30sec hold - Seated Active Hip Flexion  - 1 x daily - 5 x weekly - 2 sets - 10 reps - Sit to Stand with Armchair  - 1 x daily - 7 x weekly - 3 sets - 5 reps - Mini Squat with Counter Support  - 1 x daily - 5 x weekly - 3 sets - 10 reps - Alternating Step Backward with Support  - 1 x daily - 5 x weekly - 2 sets - 10 reps - Side Stepping with Counter Support  - 1 x daily - 5 x weekly - 2 sets - 10 reps - Standing Quarter Turn with Counter Support  - 1-2 x daily - 7 x weekly - 1 sets - 3-5 reps    ----------------------------------------------------------------- Note: Objective measures below were completed at Evaluation unless otherwise noted.  DIAGNOSTIC FINDINGS: NA per this episode  COGNITION: Overall cognitive status:  hx of MCI per MD note; WFL for session today   SENSATION: WFL  COORDINATION: Slowed RAM with toe tapping  EDEMA:  1+pitting edema BLEs MD is aware-has recommended compression stocking  MUSCLE TONE: Mild increase LLE tone  POSTURE: rounded shoulders, forward head, and posterior pelvic tilt  LOWER EXTREMITY ROM:     Active  Right Eval Left Eval  Hip flexion    Hip extension    Hip abduction    Hip adduction    Hip internal rotation    Hip external rotation    Knee flexion    Knee extension    Ankle dorsiflexion 10 8  Ankle plantarflexion    Ankle inversion    Ankle eversion     (Blank rows = not tested)    TRANSFERS: Assistive device  utilized: None  Sit to stand: CGA and without use of BUEs, he has posterior lean, not as prominent with use of BUEs Stand to sit: CGA   GAIT: Gait pattern: step through pattern, decreased step length- Right, decreased step length- Left, shuffling, trunk flexed, and narrow BOS Distance walked: 50 ft x 2 Assistive device utilized: None Level of assistance: SBA  FUNCTIONAL TESTS:  5 times sit to stand: 18 sec with 3 of 5 episodes of posterior lean Timed up and go (TUG): 12.41 sec 10 meter walk test: 10 sec  MiniBESTest:  14/28 TUG cognitive:  23.25 esc TUG manual:  12.94 sec 360 turn R:  13 steps, 4.66 sec 360 turn L:  12 steps, 4.44 sec 64M back:  7.34 sec, small, quick, narrow BOS steps   [facility] PT Assessment - 03/30/23 1158       Mini-BESTest   Sit To Stand Moderate: Comes to stand WITH use of hands on first attempt.    Rise to Toes Normal: Stable for 3 s with maximum height.    Stand on one leg (  left) Moderate: < 20 s    Stand on one leg (right) Moderate: < 20 s    Stand on one leg - lowest score 1    Compensatory Stepping Correction - Forward No step, OR would fall if not caught, OR falls spontaneously.   no step   Compensatory Stepping Correction - Backward No step, OR would fall if not caught, OR falls spontaneously.    Compensatory Stepping Correction - Left Lateral Severe: Falls, or cannot step   no step   Compensatory Stepping Correction - Right Lateral Severe:  Falls, or cannot step   no step   Stepping Corredtion Lateral - lowest score 0    Stance - Feet together, eyes open, firm surface  Normal: 30s    Stance - Feet together, eyes closed, foam surface  Moderate: < 30s   17.19   Incline - Eyes Closed Moderate: Stands independently < 30s OR aligns with surface   7-9 sec   Change in Gait Speed Normal: Significantly changes walkling speed without imbalance    Walk with head turns - Horizontal Normal: performs head turns with no change in gait speed and good balance    Walk  with pivot turns Moderate:Turns with feet close SLOW (>4 steps) with good balance.    Step over obstacles Moderate: Steps over box but touches box OR displays cautious behavior by slowing gait.    Timed UP & GO with Dual Task Moderate: Dual Task affects either counting OR walking (>10%) when compared to the TUG without Dual Task.    Mini-BEST total score 15                GOALS: Goals reviewed with patient? Yes  SHORT TERM GOALS: Target date: 01/23/2023  Pt will be independent with HEP for improved balance, strength, gait. Baseline: Goal status: MET 01/22/2023  2.  Pt will improve 5x sit<>stand to less than or equal to 15 sec with no posterior lean to demonstrate improved functional strength and transfer efficiency. Baseline: 18 sec, 3 of 5 reps posterior lean>15.13 sec with UE support at chair, no posterior lean Goal status: MET 01/22/2023  3.  Pt will improve TUG cognitive score to less than or equal to 18 sec for decreased fall risk. Baseline: 23.45 sec>15.53 sec Goal status: MET 01/22/2023   LONG TERM GOALS: Target date: 02/20/2023>03/27/2023  Pt will be independent with progression of HEP for improved balance, strength, gait. Baseline: update given 02/20/2023; needs cues 03/30/2023 Goal status: NOT MET 03/30/2023  2.  Pt will improve 5x sit<>stand to less than or equal to 12.5 sec and no posterior lean to demonstrate improved functional strength and transfer efficiency. Baseline: 22.44 sec with initial posterior lean (hands on knees)>16.43 sec 03/30/2023; 27 sec wit posterior lean no UE support Goal status: NOT  MET 03/30/2023  3.  Pt will improve MiniBESTest score to at least 19/28 to decrease fall risk. Baseline: 14/28>17/28 02/2023>15/28 03/30/2023 Goal status: NOT MET 03/30/2023  4.  Pt will perform 360 turn to R and L in 8 steps or less for improved balance. Baseline: 12-13 steps; 12-14 steps with supervision 02/18/2023>10-11 steps with min guard Goal status: NOT  YET MET, IN PROGRESS 02/18/2023;IN PROGRESS 03/30/2023  5.  Pt will perform posterior push and release test in 2 steps or less, for improved posterior balance recovery. Baseline: 2-3 steps Goal status: IN PROGRESS, 03/30/2023  6.  Pt will verbalize understanding of local Parkinson's disease community resources, including optimal fitness program. Baseline: info  provided 03/30/2023 Goal status: MET 03/30/2023  LONG TERM GOALS: UPDATED Target date: 04/24/2023  Pt will be independent with final HEP for improved balance, transfers, and gait. Baseline:  Goal status: INITIAL  2.  Pt will perform 8 of 10 reps of sit to stand with no retropulsion, using BUEs as needed, for improved safety with transfers. Baseline: without UE support, pt has retropulsion 5 of 5 trials Goal status: INITIAL  3.  Pt will perform 360 turn to R and L in 8 steps or less for improved balance. Baseline: 10-11 steps Goal status: IN PROGRESS  4.  Pt will verbalize understanding of continued community fitness to maximize gains made in PT. Baseline:  Goal status: INITIAL  5.  Pt will ambulate at least 500 ft, indoor/outdoor surfaces with appropriate assistive device (cane vs walker), mod I, for improved safety with community gait. Baseline:  Goal status: INITIAL    ASSESSMENT:  CLINICAL IMPRESSION: 10th Visit PN:  Pt presents to OPPT and reports no falls; he had to cancel a visit last week due to a conflicting appointment.  Pt reports continueing to have issues with balance, though he does feel the cane helps.  Objective measures:  MiniBESTest 15/28 (score has declined from 17/28 last check), FTSTS 16.93 sec with BUE support, >27 sec without UE support and posterior lean each rep.  He has shown improvement with 360 turn, 10-11 steps, improved from 12-14 steps.  He continues to have difficulty with balance on compliant surfaces with vision removed.  He is trying to use cane more consistently; however, he continues to have  difficulty with consistent sequence of cane.  Had frank discussion with pt in regards to balance measures that are showing some decline.  He has had several falls during our course of therapy thus far, and he continues to demo posterior tendency and retropulsion with vision removed and with unlevel surfaces.  Discussed continueing PT for several additional weeks to really work on safety with assistive device (may need to consider rollator or U-step to help with balance, pacing, step length) and balance exercises to encourage balance recovery.    OBJECTIVE IMPAIRMENTS: Abnormal gait, decreased balance, decreased knowledge of use of DME, decreased mobility, difficulty walking, decreased ROM, decreased strength, impaired flexibility, and postural dysfunction.   ACTIVITY LIMITATIONS: standing, squatting, transfers, and locomotion level  PARTICIPATION LIMITATIONS: community activity and yard work  PERSONAL FACTORS: 3+ comorbidities: see above  are also affecting patient's functional outcome.   REHAB POTENTIAL: Good  CLINICAL DECISION MAKING: Evolving/moderate complexity  EVALUATION COMPLEXITY: Moderate  PLAN:  PT FREQUENCY: 2x/week  PT DURATION: 8 weeks plus eval  PLANNED INTERVENTIONS: 97110-Therapeutic exercises, 97530- Therapeutic activity, 97112- Neuromuscular re-education, 97535- Self Care, [id number]- Manual therapy, [phone]- Gait training, Patient/Family education, Balance training, and DME instructions  PLAN FOR NEXT SESSION: Sit to stand with chairs without arms to simulate household chairs; gait training with cane (or consider rollator/U-step) Update HEP as needed for targeted sit to stand/squats/balance strategies.     Lonia Blood, PT 03/31/23 10:04 AM Phone: [phone] Fax: [phone]   [facility] at [facility] [address] [place], [address] [place], [place] [address] Phone # [phone] Fax # [phone]

## 2023-03-31 ENCOUNTER — Ambulatory Visit: Payer: Medicare Other | Admitting: Physical Therapy

## 2023-03-31 NOTE — Therapy (Signed)
OUTPATIENT PHYSICAL THERAPY NEURO TREATMENT NOTE   Patient Name: Cody Dickerson MRN: [id number] DOB:[birth year], 74 y.o., male Today's Date: 04/02/2023   PCP: Daisy Floro, MD REFERRING PROVIDER: Tat, Octaviano Batty, DO          END OF SESSION:  PT End of Session - 04/02/23 1059     Visit Number 20    Number of Visits 25    Date for PT Re-Evaluation 04/24/23    Authorization Type Medicare/[employer]    Progress Note Due on Visit 29   (completed visit 19)   PT Start Time 1017    PT Stop Time 1057    PT Time Calculation (min) 40 min    Equipment Utilized During Treatment Gait belt    Activity Tolerance Patient tolerated treatment well    Behavior During Therapy WFL for tasks assessed/performed                            Past Medical History:  Diagnosis Date   Balance problem    Gout    Hypercholesterolemia    Hypertension    Parkinson's disease (HCC)    Small bowel obstruction (HCC)    Past Surgical History:  Procedure Laterality Date   BOWEL BLOCKAGE  05/2019   IR ANGIO INTRA EXTRACRAN SEL COM CAROTID INNOMINATE BILAT MOD SED  10/03/2021   IR ANGIO VERTEBRAL SEL VERTEBRAL UNI R MOD SED  10/03/2021   IR RADIOLOGIST EVAL & MGMT  08/19/2021   IR US GUIDE VASC ACCESS RIGHT  10/04/2021   Patient Active Problem List   Diagnosis Date Noted   Pain due to onychomycosis of toenails of both feet 09/03/2022   Porokeratosis 06/04/2022   Parkinson's disease (HCC) 02/06/2021   SBO (small bowel obstruction) (HCC) 07/06/2019    ONSET DATE: 12/15/2022 (MD referral)  REFERRING DIAG:  R29.3 (ICD-10-CM) - Abnormal posture  R27.8 (ICD-10-CM) - Other lack of coordination  R25.1 (ICD-10-CM) - Tremor  M62.81 (ICD-10-CM) - Muscle weakness (generalized)  R29.898 (ICD-10-CM) - Other symptoms and signs involving the musculoskeletal system  R26.81 (ICD-10-CM) - Unsteadiness on feet  R26.89 (ICD-10-CM) - Other abnormalities of gait and mobility    THERAPY DIAG:  Muscle  weakness (generalized)  Abnormal posture  Other symptoms and signs involving the nervous system  Other abnormalities of gait and mobility  Unsteadiness on feet  Rationale for Evaluation and Treatment: Rehabilitation  SUBJECTIVE:  SUBJECTIVE STATEMENT: "Not much"  Pt accompanied by: self  PERTINENT HISTORY: Left distal supraclinoid ICA aneurysm, MCI  PAIN:  Are you having pain? Yes: NPRS scale: 0-2/10 Pain location: low back Pain description: aching Aggravating factors: shoveling snow, standing  Relieving factors: sitting  PRECAUTIONS: Fall  RED FLAGS: None   WEIGHT BEARING RESTRICTIONS: No  FALLS: Has patient fallen in last 6 months? Yes. Number of falls 3-4 -Able to get up on his own from falls, though slowly per report.    LIVING ENVIRONMENT: Lives with: lives with their spouse; pt has to help her at times Lives in: House/apartment Stairs: Yes: External: 3 steps; on right going up Has following equipment at home: None  PLOF: Independent; Spin class 1x/wk, has tried [employer]; walks several times per week; enjoys yard work  PATIENT GOALS: Pt's goals for therapy to get up better from chairs, have better balance.  OBJECTIVE:      TODAY'S TREATMENT: 04/02/23 Activity Comments  TM walking 0.9-1.5 mph with cueing for longer, more efficient steps and gaze on target out in front ot prevent trunk lean. Required frequent cues to maintain safety and PT adjusted speed d/t pt unable to focus on pressing button and walking at the same time   STS focusing on no retropulsion  Difficult time following cueing to perform "nose over toes" and excessive retropulsion requiring min A  Sitting forward trunk lean to cone on floor  To prime for STS  STS, reaching arms down towards cone on floor Use of  teach-back for proper set up. Heavy cueing for allowing full anterior trunk lean  figure 8 turns with cane Cues for wider BOS, sequencing   gait training outside with cane 548ft  Pt unsteady, discontinuous steps and speed and required CGA-min A, posterior lean with step ups     PATIENT EDUCATION: Education details: added notes for STS into HEP and encouraged to increase practice of STS at home d/t difficulty; encouraged to perform STS at counter for safety  Person educated: Patient Education method: Explanation, Demonstration, Tactile cues, Verbal cues, and Handouts Education comprehension: verbalized understanding and returned demonstration     Access Code: [email or web address] URL: [email or web address] Date: 02/20/2023 Prepared by: [facility] Outpatient  Re[facility] - [facility]  Exercises - Seated Hamstring Stretch  - 1-2 x daily - 7 x weekly - 1 sets - 3 reps - 30 sec hold - Standing Gastroc Stretch at Counter  - 1-2 x daily - 7 x weekly - 1 sets - 3 reps - 30sec hold - Seated Active Hip Flexion  - 1 x daily - 5 x weekly - 2 sets - 10 reps - Sit to Stand with Armchair  - 1 x daily - 7 x weekly - 3 sets - 5 reps - Mini Squat with Counter Support  - 1 x daily - 5 x weekly - 3 sets - 10 reps - Alternating Step Backward with Support  - 1 x daily - 5 x weekly - 2 sets - 10 reps - Side Stepping with Counter Support  - 1 x daily - 5 x weekly - 2 sets - 10 reps - Standing Quarter Turn with Counter Support  - 1-2 x daily - 7 x weekly - 1 sets - 3-5 reps    ----------------------------------------------------------------- Note: Objective measures below were completed at Evaluation unless otherwise noted.  DIAGNOSTIC FINDINGS: NA per this episode  COGNITION: Overall cognitive status:  hx of MCI per MD note; [facility] for session today  SENSATION: WFL  COORDINATION: Slowed RAM with toe tapping  EDEMA:  1+pitting edema BLEs MD is aware-has recommended compression  stocking  MUSCLE TONE: Mild increase LLE tone  POSTURE: rounded shoulders, forward head, and posterior pelvic tilt  LOWER EXTREMITY ROM:     Active  Right Eval Left Eval  Hip flexion    Hip extension    Hip abduction    Hip adduction    Hip internal rotation    Hip external rotation    Knee flexion    Knee extension    Ankle dorsiflexion 10 8  Ankle plantarflexion    Ankle inversion    Ankle eversion     (Blank rows = not tested)    TRANSFERS: Assistive device utilized: None  Sit to stand: CGA and without use of BUEs, he has posterior lean, not as prominent with use of BUEs Stand to sit: CGA   GAIT: Gait pattern: step through pattern, decreased step length- Right, decreased step length- Left, shuffling, trunk flexed, and narrow BOS Distance walked: 50 ft x 2 Assistive device utilized: None Level of assistance: SBA  FUNCTIONAL TESTS:  5 times sit to stand: 18 sec with 3 of 5 episodes of posterior lean Timed up and go (TUG): 12.41 sec 10 meter walk test: 10 sec  MiniBESTest:  14/28 TUG cognitive:  23.25 esc TUG manual:  12.94 sec 360 turn R:  13 steps, 4.66 sec 360 turn L:  12 steps, 4.44 sec 37M back:  7.34 sec, small, quick, narrow BOS steps        GOALS: Goals reviewed with patient? Yes  SHORT TERM GOALS: Target date: 01/23/2023  Pt will be independent with HEP for improved balance, strength, gait. Baseline: Goal status: MET 01/22/2023  2.  Pt will improve 5x sit<>stand to less than or equal to 15 sec with no posterior lean to demonstrate improved functional strength and transfer efficiency. Baseline: 18 sec, 3 of 5 reps posterior lean>15.13 sec with UE support at chair, no posterior lean Goal status: MET 01/22/2023  3.  Pt will improve TUG cognitive score to less than or equal to 18 sec for decreased fall risk. Baseline: 23.45 sec>15.53 sec Goal status: MET 01/22/2023   LONG TERM GOALS: Target date: 02/20/2023>03/27/2023  Pt will be  independent with progression of HEP for improved balance, strength, gait. Baseline: update given 02/20/2023; needs cues 03/30/2023 Goal status: NOT MET 03/30/2023  2.  Pt will improve 5x sit<>stand to less than or equal to 12.5 sec and no posterior lean to demonstrate improved functional strength and transfer efficiency. Baseline: 22.44 sec with initial posterior lean (hands on knees)>16.43 sec 03/30/2023; 27 sec wit posterior lean no UE support Goal status: NOT  MET 03/30/2023  3.  Pt will improve MiniBESTest score to at least 19/28 to decrease fall risk. Baseline: 14/28>17/28 02/2023>15/28 03/30/2023 Goal status: NOT MET 03/30/2023  4.  Pt will perform 360 turn to R and L in 8 steps or less for improved balance. Baseline: 12-13 steps; 12-14 steps with supervision 02/18/2023>10-11 steps with min guard Goal status: NOT YET MET, IN PROGRESS 02/18/2023;IN PROGRESS 03/30/2023  5.  Pt will perform posterior push and release test in 2 steps or less, for improved posterior balance recovery. Baseline: 2-3 steps Goal status: IN PROGRESS, 03/30/2023  6.  Pt will verbalize understanding of local Parkinson's disease community resources, including optimal fitness program. Baseline: info provided 03/30/2023 Goal status: MET 03/30/2023  LONG TERM GOALS: UPDATED Target date: 04/24/2023  Pt will  be independent with final HEP for improved balance, transfers, and gait. Baseline:  Goal status: IN PROGRESS  2.  Pt will perform 8 of 10 reps of sit to stand with no retropulsion, using BUEs as needed, for improved safety with transfers. Baseline: without UE support, pt has retropulsion 5 of 5 trials Goal status: IN PROGRESS  3.  Pt will perform 360 turn to R and L in 8 steps or less for improved balance. Baseline: 10-11 steps Goal status: IN PROGRESS  4.  Pt will verbalize understanding of continued community fitness to maximize gains made in PT. Baseline:  Goal status: IN PROGRESS  5.  Pt will ambulate at  least 500 ft, indoor/outdoor surfaces with appropriate assistive device (cane vs walker), mod I, for improved safety with community gait. Baseline:  Goal status: IN PROGRESS    ASSESSMENT:  CLINICAL IMPRESSION: Patient arrived to session without complaints. Upon standing up from table at start of session, patient with increased retropulsion today. Worked on addressing this with visual and verbal cues. Patient continues to struggle using cane with turns and ambulation. Gait training outside required CGA-min A d/t instability and discontinuous steps/speed. As patient continues to struggle with this device, may need to this about more AD in future.  OBJECTIVE IMPAIRMENTS: Abnormal gait, decreased balance, decreased knowledge of use of DME, decreased mobility, difficulty walking, decreased ROM, decreased strength, impaired flexibility, and postural dysfunction.   ACTIVITY LIMITATIONS: standing, squatting, transfers, and locomotion level  PARTICIPATION LIMITATIONS: community activity and yard work  PERSONAL FACTORS: 3+ comorbidities: see above  are also affecting patient's functional outcome.   REHAB POTENTIAL: Good  CLINICAL DECISION MAKING: Evolving/moderate complexity  EVALUATION COMPLEXITY: Moderate  PLAN:  PT FREQUENCY: 2x/week  PT DURATION: 8 weeks plus eval  PLANNED INTERVENTIONS: 97110-Therapeutic exercises, 97530- Therapeutic activity, 97112- Neuromuscular re-education, 97535- Self Care, [id number]- Manual therapy, [phone]- Gait training, Patient/Family education, Balance training, and DME instructions  PLAN FOR NEXT SESSION: Sit to stand with chairs without arms to simulate household chairs; gait training with cane (or consider rollator/U-step) Update HEP as needed for targeted sit to stand/squats/balance strategies.     Baldemar Friday, PT, DPT 04/02/23 11:00 AM  [facility] at [facility] [address] [place], [address] [place], [place]  [address] Phone # [phone] Fax # [phone]

## 2023-04-02 ENCOUNTER — Encounter: Payer: Self-pay | Admitting: Physical Therapy

## 2023-04-02 ENCOUNTER — Ambulatory Visit: Payer: Medicare Other | Admitting: Physical Therapy

## 2023-04-02 ENCOUNTER — Ambulatory Visit: Payer: Medicare Other | Admitting: Occupational Therapy

## 2023-04-02 DIAGNOSIS — M6281 Muscle weakness (generalized): Secondary | ICD-10-CM

## 2023-04-02 DIAGNOSIS — R29818 Other symptoms and signs involving the nervous system: Secondary | ICD-10-CM | POA: Diagnosis not present

## 2023-04-02 DIAGNOSIS — R293 Abnormal posture: Secondary | ICD-10-CM

## 2023-04-02 DIAGNOSIS — R2689 Other abnormalities of gait and mobility: Secondary | ICD-10-CM | POA: Diagnosis not present

## 2023-04-02 DIAGNOSIS — R278 Other lack of coordination: Secondary | ICD-10-CM

## 2023-04-02 DIAGNOSIS — R41840 Attention and concentration deficit: Secondary | ICD-10-CM

## 2023-04-02 DIAGNOSIS — R2681 Unsteadiness on feet: Secondary | ICD-10-CM

## 2023-04-02 NOTE — Therapy (Signed)
OUTPATIENT OCCUPATIONAL THERAPY PARKINSON'S  Treatment Note   Patient Name: Cody Dickerson MRN: [id number] DOB:02/04/1951, 73 y.o., male Today's Date: 04/02/2023  PCP: Daisy Floro, MD  REFERRING PROVIDER: Tat, Octaviano Batty, DO   END OF SESSION:  OT End of Session - 04/02/23 0936     Visit Number 13    Number of Visits 24    Date for OT Re-Evaluation 05/01/23    Authorization Type Medicare A&B    Progress Note Due on Visit 10    OT Start Time 0934    OT Stop Time 1015    OT Time Calculation (min) 41 min    Activity Tolerance Patient tolerated treatment well    Behavior During Therapy [facility] for tasks assessed/performed                         Past Medical History:  Diagnosis Date   Balance problem    Gout    Hypercholesterolemia    Hypertension    Parkinson's disease (HCC)    Small bowel obstruction (HCC)    Past Surgical History:  Procedure Laterality Date   BOWEL BLOCKAGE  05/2019   IR ANGIO INTRA EXTRACRAN SEL COM CAROTID INNOMINATE BILAT MOD SED  10/03/2021   IR ANGIO VERTEBRAL SEL VERTEBRAL UNI R MOD SED  10/03/2021   IR RADIOLOGIST EVAL & MGMT  08/19/2021   IR US GUIDE VASC ACCESS RIGHT  10/04/2021   Patient Active Problem List   Diagnosis Date Noted   Pain due to onychomycosis of toenails of both feet 09/03/2022   Porokeratosis 06/04/2022   Parkinson's disease (HCC) 02/06/2021   SBO (small bowel obstruction) (HCC) 07/06/2019    ONSET DATE: 12/15/22 (referral date),  Pt reports Parkinson's dx approx. 3 years ago (approx. 2021)  REFERRING DIAG:   R29.3 (ICD-10-CM) - Abnormal posture  R27.8 (ICD-10-CM) - Other lack of coordination  R25.1 (ICD-10-CM) - Tremor  M62.81 (ICD-10-CM) - Muscle weakness (generalized)  R29.898 (ICD-10-CM) - Other symptoms and signs involving the musculoskeletal system  R26.81 (ICD-10-CM) - Unsteadiness on feet  R26.89 (ICD-10-CM) - Other abnormalities of gait and mobility    THERAPY DIAG:  Other symptoms and signs  involving the nervous system  Unsteadiness on feet  Other lack of coordination  Attention and concentration deficit  Rationale for Evaluation and Treatment: Rehabilitation  SUBJECTIVE:   SUBJECTIVE STATEMENT: Pt reports "nothing new".  Pt accompanied by: self  PERTINENT HISTORY: 08/12/22 MD Progress Notes and 08/18/22 DO Progress Notes: Parkinson's disease, left distal supraclinoid ICA aneurysm (aneurysm size stable), MCI, HTN, gout, hypercholesterolemia  PRECAUTIONS: Fall, posterior lean when standing/walking   WEIGHT BEARING RESTRICTIONS: No  PAIN:  Are you having pain? Yes 1/10 backpain from shoveling snow a few weeks ago  FALLS: Has patient fallen in last 6 months? Yes. Number of falls Pt reports approx. 4 falls. Pt reports picking up sticks in yard when 2 falls occurred.  Per 12/11/22 PT Screen: Pt reports "Balance is bad". Have had several falls in the yard.   LIVING ENVIRONMENT: Lives with: lives with their spouse Lives in: House/apartment Stairs: Yes: External: 3 steps; on left going up Has following equipment at home: Grab bars  PLOF: Independent with basic ADLs, pt currently drives  PATIENT GOALS: "to do learn how to do things better."  OBJECTIVE:  Note: Objective measures were completed at Evaluation unless otherwise noted.  HAND DOMINANCE: Right  ADLs: Overall ADLs:  Transfers/ambulation related to ADLs: Eating: ind,  pt reports slower and more meticulous to get items on fork. No difficulty with cutting and serving food. Grooming: Pt reports shaving with standard razor blade is slower. Pt has electric shaver available though pt has not attempted to use it. UB Dressing: Pt reports "Not as good putting jacket on as I used to be." Pt currently using cape method to don jacket, a strategy learned in previous OT sessions. Increased difficulty with buttons: "I have more trouble with buttoning buttons placed in front of me than when a shirt is on me." Pt reports  taking extra time to work out zippers if zippers get stuck. LB Dressing: "Getting my pant legs on and off are a little slower but it's acceptable." Toileting: Ind Bathing: Ind with someone else in the home for safety Tub Shower transfers: ind Equipment: Grab bars, Sports administrator, and tub/shower  IADLs: Shopping: spouse completes grocery shopping, pt helps out with carrying heavy water Light housekeeping: pt folds laundry without difficulty Meal Prep: Ind with cutting, pt completes charcoal grilling tasks and pt reports being careful and walking slow around grill for safety. 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 Handwriting: Pt reports handwriting is slower than it used to be, and even slower if he focuses on writing legibly.   Pt wrote "Whales live in [place] ocean" in 18 seconds. Increased time required and approx. 66% legible.  MOBILITY STATUS: Ind without A/E. PT reported to OT that pt demo's significant posterior lean when standing and walking. Pt reports "imbalance tends to go backwards."  POSTURE COMMENTS:  rounded shoulders and forward head  ACTIVITY TOLERANCE: Activity tolerance: Pt reports increased fatigue and sleepiness during the day. Pt reports sleeping well at night. Pt reports increased fatigue may be d/t medication though pt is unsure.  FUNCTIONAL OUTCOME MEASURES: 03/19/23 Fastening/unfastening 3 buttons: 2:23 (however last button required 55 seconds to unfasten). OT noted mild tremors of BUE when completing task.  Physical performance test: PPT#2 (simulated eating) 15.22 seconds. OT noted mild tremors of RUE when completing task.  PPT#4 (donning/doffing jacket): 22.31 sec   Fastening/unfastening 3 buttons: 98 seconds. OT noted mild tremors of BUE when completing task.  Physical performance test: PPT#2 (simulated eating) 17 seconds.  OT noted mild tremors of RUE when completing task.  PPT#4 (donning/doffing jacket): 18 seconds. With gait belt. OT noted mild tremors of BUE during task.  COORDINATION: 03/19/23 9 Hole Peg test: Right: 46.56 sec; Left: 49.85 sec Box and Blocks:  Right 37 blocks, Left 40 blocks  01/22/23: 9 Hole Peg test: Right: 43.5 sec; Left: 41.25 sec Box and Blocks:  Right 38 blocks, Left 38 blocks  EVAL: 9 Hole Peg test: Right: 36 sec; Left: 36 sec Box and Blocks:  Right 34 blocks, Left 41 blocks  UE ROM:    BUE shoulders flex/ABD - WFL.  BUE elbow flex/ext - WFL with v/c to fully ext. R elbow ext slower than L elbow ext.  Wrist flex/ext - WFL, v/c to fully ext.  Digits flex/ext - WFL, v/c to fully ext thumbs with slightly decreased AROM of B thumbs noted.  Note: Pt demo'd decreased speed to attain full AROM of BUE.   UE MMT:   Not tested  SENSATION: WFL Pt reports no change.  COGNITION: Overall cognitive status: Within functional limits for tasks assessed. Pt reports more difficulty remembering day of the week, taking medication at 4 PM specifically.  Pt reports using calendar to help with memory.   OBSERVATIONS: Pt was pleasant and agreeable. Pt recognized difficulty with ADL/IADL tasks and described current level of functioning well. OT noted Bradykinesia and mild tremors with FM tasks . Pt donned reading glasses for handwriting task. Pt with good recall of techniques and strategies from previous OT session, motivated to participate in [facility] tasks.   TODAY'S TREATMENT:                                                                      DATE:  04/02/23 Resistance Clothespins: 2,4,6# with RUE and then LUE for low and high functional reaching and sustained pinch with cues to focus on intentional movements with hand opening and reaching. Engaged in reaching out to R to low surface to pick up clothespins and reaching across midline to place onto high vertical dowel, then removing from  vertical dowel and further crossing midline to place clothespin onto low surface; after seated rest break completing reaching in same manner with LUE.  OT providing intermittent cues for intentional reach and CGA with trunk rotation across midline.  OT providing cues initially for wide stance to increase balance and BOS prior to engaging in reaching.  Coordination: with RUE and LUE, engaged in placing small wooden pegs one at a time and then with in-hand manipulation and translation to place into peg board.  Pt with increased difficulty with translation on R> L hand dropping 1/5 pegs each attempt. Buttons: massed practice with buttoning/unbuttoning. OT reiterating education on "push and pull" technique and hand placement with pt utilizing intermittently.  Pt completed 3 button/unbutton in 57.59 sec.  Pt reports inability to unbutton  pocket on back of pants, requiring need to take pants off and ensure button unbuttons prior to donning.  Engaged in discussion in regards to use of large amplitude movements even when reaching behind self as when needing to remove/place wallet in back pocket.      03/30/23 Large amplitude: pt completing finger flicks and thumb opposition with focus on big, intentional movements prior to engaging in [facility] tasks. Peg board: engaged in small peg board pattern replication with focus on intentional movements when picking up and placing pegs into peg board.  Pt with some tremors, most prevalent on L > R. Coordination: engaged in picking up and placing coins into coin slot one at a time, progressing to completing in-hand manipulation and translation of coins from palm to finger tips to place into coin slot.   Buttons: massed practice with buttoning/unbuttoning.  Pt reports wearing button up shirt about 1x/week, reporting increased difficulty in the mornings compared to later in the day.  OT educating on "push and pull" technique and hand placement with pt utilizing intermittently.  Pt  completed 3 button/unbutton in 50.25 sec.  OT also educating on keeping bottom buttons fastened and donning/doffing shirt in pull over shirt manner to decrease need to manage as many buttons.      03/23/23 GMC/FMC: pt engaged preparatory activity on UBE for 5 mins on level 1 as this is a new task prior to FMC/GMC. Pt enaging in BIG movements for chest openers, shoulder rolls (difficulty sequencing and OT providing tactile cues), diagonals, all with cues for BIG movements. Pt  engaging in crossing midline and translation of small object, "around the worlds" for simulating dressing activity 1x15 each direction. Handwriting: engaged in writing task for familiar words and names focusing on consistency, legibility, and sizing with approx 75% legibility.    PATIENT EDUCATION: Education details:  PD specific education for large amplitude/coordination Person educated: Patient Education method: Explanation and Handouts Education comprehension: verbalized understanding  HOME EXERCISE PROGRAM: 01/20/23 - coordination exercises (see pt instructions)  01/15/23 - bag exercises (see pt instructions)  01/08/23 - large amplitude hands (see pt instructions)  GOALS: Goals reviewed with patient? Yes  GOALS:  SHORT TERM GOALS: Target date: 01/23/23    Pt will be independent with PD specific HEP. Baseline: not yet initiated Goal status: MET - completing large amplitude, bag exercises, and coordination HEP on 01/22/23  2.  Pt will verbalize understanding of adapted strategies to maximize safety and independence with ADLs/IADLs. Baseline: not yet initiated Goal status: MET - 01/22/23 3.  Pt will write a simple sentence with at least 75% legibility and complete the sentence in 15 seconds or less.  Baseline: 66% legibility, 18 seconds for simple sentence Goal status: PARTIALLY MET - required increased time (26 sec) however improved legibility to 75% on 01/22/23  4.  Pt will demonstrate improved fine  motor coordination for ADLs as evidenced by completing 9 hole peg test score for BUE in 30 seconds or less. Baseline: 9 Hole Peg test: Right: 36 sec; Left: 36 sec Goal status: NOT MET - Right: 43.5 sec; Left: 41.25 sec on 01/22/23   5.  Pt will be able to place at least 45 blocks with BUE hand with completion of Box and Blocks test. Baseline: Right 34 blocks, Left 41 blocks Goal status: NOT MET - Completed R: 38 and L: 38 on 01/22/23    NEW LONG TERM GOALS: Target date: 04/30/22    Pt will verbalize understanding of ways to prevent future PD related complications and PD community resources. Baseline: not yet initiated Goal status: IN PROGRESS  2.  Pt will write a simulated/sample check with at least 85% legibility while maintaining steady speed. Baseline: 66% legibility for simple sentence, 18 seconds for simple sentence   03/19/23 - 75% legibility, 30 seconds for simple sentence Goal status: IN PROGRESS  3.  Pt will demonstrate improved ease with fastening buttons as evidenced by decreasing 3 button/unbutton time to 80 seconds or less. Baseline: 98 seconds 03/19/23 - 2:23 (last button required 55 seconds to unfasten) Goal status: IN PROGRESS  4.  Pt will verbalize understanding of ways to keep thinking skills sharp and ways to compensate for STM changes in the future. Baseline: not yet initiated Goal status: IN PROGRESS  5.  Pt will demonstrate increased ease with dressing as evidenced by decreasing PPT#4 (don/ doff jacket) to 15 secs or less. Baseline: PPT #4: 18 secs 03/19/23 - 22:31 sec Goal status: IN PROGRESS    ASSESSMENT:  CLINICAL IMPRESSION: Pt continues to benefit from cues for foot placement, with wider BOS, when standing as well as upright posture when standing and engaging in reaching tasks.  Pt demonstrating increased dropping of pegs this session with attempts at in-hand manipulation and translation, requiring increased time. Pt maintaining improvements with  fastening buttons this session, although 7 seconds slower than previous session.  OT reiterating use of "large amplitude hands" prior to engaging in [facility] tasks to facilitate increased amplitude to mimic practice prior to completing singular task.  Pt will benefit from continued skilled occupational  therapy services to address coordination, ROM, balance, GM/FM control, safety awareness, introduction of compensatory strategies/AE prn, and implementation of an HEP to improve participation and safety during ADLs, IADLs, and quality of life.   PERFORMANCE DEFICITS: in functional skills including ADLs, IADLs, coordination, dexterity, proprioception, ROM, strength, Fine motor control, Gross motor control, mobility, balance, body mechanics, endurance, and UE functional use, cognitive skills including energy/drive and memory, and psychosocial skills including environmental adaptation.   IMPAIRMENTS: are limiting patient from ADLs, IADLs, leisure, and social participation.     PLAN:  OT FREQUENCY: 2x/week  OT DURATION: 6 weeks (dates extended to allow for scheduling)  PLANNED INTERVENTIONS: 97168 OT Re-evaluation, 97535 self care/ADL training, 54098 therapeutic exercise, 97530 therapeutic activity, 97112 neuromuscular re-education, 97140 manual therapy, 97035 ultrasound, 97018 paraffin, 11914 fluidotherapy, 97010 moist heat, 97010 cryotherapy, 97032 electrical stimulation (manual), 97014 electrical stimulation unattended, 97760 Orthotics management and training, 78295 Splinting (initial encounter), [id number] Subsequent splinting/medication, passive range of motion, functional mobility training, energy conservation, patient/family education, and DME and/or AE instructions  RECOMMENDED OTHER SERVICES: PT eval already completed on 12/24/22.  CONSULTED AND AGREED WITH PLAN OF CARE: Patient  PLAN FOR NEXT SESSION:   Large amplitude movements  Bag exercises for UB and LB dressing  FM tasks - buttons, other FMC  tasks, handwriting  Donning/doffing jacket - review strategies (e.g. "cape method")   Renada Cronin, OT 04/02/2023, 9:37 AM

## 2023-04-03 ENCOUNTER — Encounter: Payer: Self-pay | Admitting: Neurology

## 2023-04-06 ENCOUNTER — Ambulatory Visit: Payer: Medicare Other | Admitting: Occupational Therapy

## 2023-04-06 ENCOUNTER — Ambulatory Visit: Payer: Medicare Other | Attending: Family Medicine

## 2023-04-06 DIAGNOSIS — R278 Other lack of coordination: Secondary | ICD-10-CM | POA: Insufficient documentation

## 2023-04-06 DIAGNOSIS — R29818 Other symptoms and signs involving the nervous system: Secondary | ICD-10-CM | POA: Diagnosis not present

## 2023-04-06 DIAGNOSIS — R41840 Attention and concentration deficit: Secondary | ICD-10-CM | POA: Diagnosis not present

## 2023-04-06 DIAGNOSIS — R2681 Unsteadiness on feet: Secondary | ICD-10-CM | POA: Insufficient documentation

## 2023-04-06 DIAGNOSIS — R293 Abnormal posture: Secondary | ICD-10-CM | POA: Diagnosis not present

## 2023-04-06 DIAGNOSIS — M6281 Muscle weakness (generalized): Secondary | ICD-10-CM | POA: Diagnosis not present

## 2023-04-06 DIAGNOSIS — R2689 Other abnormalities of gait and mobility: Secondary | ICD-10-CM | POA: Insufficient documentation

## 2023-04-06 NOTE — Therapy (Signed)
OUTPATIENT PHYSICAL THERAPY NEURO TREATMENT NOTE   Patient Name: Cody Dickerson MRN: [id number] DOB:Jul 29, 1950, 73 y.o., male Today's Date: 04/06/2023   PCP: Daisy Floro, MD REFERRING PROVIDER: Tat, Octaviano Batty, DO          END OF SESSION:  PT End of Session - 04/06/23 1054     Visit Number 21    Number of Visits 25    Date for PT Re-Evaluation 04/24/23    Authorization Type Medicare/AARP    Progress Note Due on Visit 29   (completed visit 19)   PT Start Time 1100    PT Stop Time 1145    PT Time Calculation (min) 45 min    Equipment Utilized During Treatment Gait belt    Activity Tolerance Patient tolerated treatment well    Behavior During Therapy WFL for tasks assessed/performed                            Past Medical History:  Diagnosis Date   Balance problem    Gout    Hypercholesterolemia    Hypertension    Parkinson's disease (HCC)    Small bowel obstruction (HCC)    Past Surgical History:  Procedure Laterality Date   BOWEL BLOCKAGE  05/2019   IR ANGIO INTRA EXTRACRAN SEL COM CAROTID INNOMINATE BILAT MOD SED  10/03/2021   IR ANGIO VERTEBRAL SEL VERTEBRAL UNI R MOD SED  10/03/2021   IR RADIOLOGIST EVAL & MGMT  08/19/2021   IR US GUIDE VASC ACCESS RIGHT  10/04/2021   Patient Active Problem List   Diagnosis Date Noted   Pain due to onychomycosis of toenails of both feet 09/03/2022   Porokeratosis 06/04/2022   Parkinson's disease (HCC) 02/06/2021   SBO (small bowel obstruction) (HCC) 07/06/2019    ONSET DATE: 12/15/2022 (MD referral)  REFERRING DIAG:  R29.3 (ICD-10-CM) - Abnormal posture  R27.8 (ICD-10-CM) - Other lack of coordination  R25.1 (ICD-10-CM) - Tremor  M62.81 (ICD-10-CM) - Muscle weakness (generalized)  R29.898 (ICD-10-CM) - Other symptoms and signs involving the musculoskeletal system  R26.81 (ICD-10-CM) - Unsteadiness on feet  R26.89 (ICD-10-CM) - Other abnormalities of gait and mobility    THERAPY DIAG:  Other  symptoms and signs involving the nervous system  Unsteadiness on feet  Muscle weakness (generalized)  Abnormal posture  Other abnormalities of gait and mobility  Rationale for Evaluation and Treatment: Rehabilitation  SUBJECTIVE:  SUBJECTIVE STATEMENT: "Balance is going to hell".  No falls since last here.   Pt accompanied by: self  PERTINENT HISTORY: Left distal supraclinoid ICA aneurysm, MCI  PAIN:  Are you having pain? Yes: NPRS scale: 0-2/10 Pain location: low back Pain description: aching Aggravating factors: shoveling snow, standing  Relieving factors: sitting  PRECAUTIONS: Fall  RED FLAGS: None   WEIGHT BEARING RESTRICTIONS: No  FALLS: Has patient fallen in last 6 months? Yes. Number of falls 3-4 -Able to get up on his own from falls, though slowly per report.    LIVING ENVIRONMENT: Lives with: lives with their spouse; pt has to help her at times Lives in: House/apartment Stairs: Yes: External: 3 steps; on right going up Has following equipment at home: None  PLOF: Independent; Spin class 1x/wk, has tried [employer]; walks several times per week; enjoys yard work  PATIENT GOALS: Pt's goals for therapy to get up better from chairs, have better balance.  OBJECTIVE:   TODAY'S TREATMENT: 04/06/23 Activity Comments  Trials in sit to stand Use of rollator and cues for extending hands beyond cross bar to improve trunk flexion over BOS  Gait training W/ rollator and U-step, level surfaces, ramp, curb negotiation  Multisensory balance Rocker board, airex pad,   Dynamic balance Large amplitude sidestep Forward-retro Alt cone taps              TODAY'S TREATMENT: 04/02/23 Activity Comments  TM walking 0.9-1.5 mph with cueing for longer, more efficient steps and gaze on target  out in front ot prevent trunk lean. Required frequent cues to maintain safety and PT adjusted speed d/t pt unable to focus on pressing button and walking at the same time   STS focusing on no retropulsion  Difficult time following cueing to perform "nose over toes" and excessive retropulsion requiring min A  Sitting forward trunk lean to cone on floor  To prime for STS  STS, reaching arms down towards cone on floor Use of teach-back for proper set up. Heavy cueing for allowing full anterior trunk lean  figure 8 turns with cane Cues for wider BOS, sequencing   gait training outside with cane 542ft  Pt unsteady, discontinuous steps and speed and required CGA-min A, posterior lean with step ups     PATIENT EDUCATION: Education details: added notes for STS into HEP and encouraged to increase practice of STS at home d/t difficulty; encouraged to perform STS at counter for safety  Person educated: Patient Education method: Explanation, Demonstration, Tactile cues, Verbal cues, and Handouts Education comprehension: verbalized understanding and returned demonstration     Access Code: [email or web address] URL: [email or web address] Date: 02/20/2023 Prepared by: [facility] - Outpatient  Rehab - [facility]  Exercises - Seated Hamstring Stretch  - 1-2 x daily - 7 x weekly - 1 sets - 3 reps - 30 sec hold - Standing Gastroc Stretch at Counter  - 1-2 x daily - 7 x weekly - 1 sets - 3 reps - 30sec hold - Seated Active Hip Flexion  - 1 x daily - 5 x weekly - 2 sets - 10 reps - Sit to Stand with Armchair  - 1 x daily - 7 x weekly - 3 sets - 5 reps - Mini Squat with Counter Support  - 1 x daily - 5 x weekly - 3 sets - 10 reps - Alternating Step Backward with Support  - 1 x daily - 5 x weekly - 2 sets - 10 reps - Side Stepping  with Counter Support  - 1 x daily - 5 x weekly - 2 sets - 10 reps - Standing Quarter Turn with Counter Support  - 1-2 x daily - 7 x weekly - 1 sets - 3-5  reps    ----------------------------------------------------------------- Note: Objective measures below were completed at Evaluation unless otherwise noted.  DIAGNOSTIC FINDINGS: NA per this episode  COGNITION: Overall cognitive status:  hx of MCI per MD note; WFL for session today   SENSATION: WFL  COORDINATION: Slowed RAM with toe tapping  EDEMA:  1+pitting edema BLEs MD is aware-has recommended compression stocking  MUSCLE TONE: Mild increase LLE tone  POSTURE: rounded shoulders, forward head, and posterior pelvic tilt  LOWER EXTREMITY ROM:     Active  Right Eval Left Eval  Hip flexion    Hip extension    Hip abduction    Hip adduction    Hip internal rotation    Hip external rotation    Knee flexion    Knee extension    Ankle dorsiflexion 10 8  Ankle plantarflexion    Ankle inversion    Ankle eversion     (Blank rows = not tested)    TRANSFERS: Assistive device utilized: None  Sit to stand: CGA and without use of BUEs, he has posterior lean, not as prominent with use of BUEs Stand to sit: CGA   GAIT: Gait pattern: step through pattern, decreased step length- Right, decreased step length- Left, shuffling, trunk flexed, and narrow BOS Distance walked: 50 ft x 2 Assistive device utilized: None Level of assistance: SBA  FUNCTIONAL TESTS:  5 times sit to stand: 18 sec with 3 of 5 episodes of posterior lean Timed up and go (TUG): 12.41 sec 10 meter walk test: 10 sec  MiniBESTest:  14/28 TUG cognitive:  23.25 esc TUG manual:  12.94 sec 360 turn R:  13 steps, 4.66 sec 360 turn L:  12 steps, 4.44 sec 43M back:  7.34 sec, small, quick, narrow BOS steps        GOALS: Goals reviewed with patient? Yes  SHORT TERM GOALS: Target date: 01/23/2023  Pt will be independent with HEP for improved balance, strength, gait. Baseline: Goal status: MET 01/22/2023  2.  Pt will improve 5x sit<>stand to less than or equal to 15 sec with no posterior lean to  demonstrate improved functional strength and transfer efficiency. Baseline: 18 sec, 3 of 5 reps posterior lean>15.13 sec with UE support at chair, no posterior lean Goal status: MET 01/22/2023  3.  Pt will improve TUG cognitive score to less than or equal to 18 sec for decreased fall risk. Baseline: 23.45 sec>15.53 sec Goal status: MET 01/22/2023   LONG TERM GOALS: Target date: 02/20/2023>03/27/2023  Pt will be independent with progression of HEP for improved balance, strength, gait. Baseline: update given 02/20/2023; needs cues 03/30/2023 Goal status: NOT MET 03/30/2023  2.  Pt will improve 5x sit<>stand to less than or equal to 12.5 sec and no posterior lean to demonstrate improved functional strength and transfer efficiency. Baseline: 22.44 sec with initial posterior lean (hands on knees)>16.43 sec 03/30/2023; 27 sec wit posterior lean no UE support Goal status: NOT  MET 03/30/2023  3.  Pt will improve MiniBESTest score to at least 19/28 to decrease fall risk. Baseline: 14/28>17/28 02/2023>15/28 03/30/2023 Goal status: NOT MET 03/30/2023  4.  Pt will perform 360 turn to R and L in 8 steps or less for improved balance. Baseline: 12-13 steps; 12-14 steps with supervision  02/18/2023>10-11 steps with min guard Goal status: NOT YET MET, IN PROGRESS 02/18/2023;IN PROGRESS 03/30/2023  5.  Pt will perform posterior push and release test in 2 steps or less, for improved posterior balance recovery. Baseline: 2-3 steps Goal status: IN PROGRESS, 03/30/2023  6.  Pt will verbalize understanding of local Parkinson's disease community resources, including optimal fitness program. Baseline: info provided 03/30/2023 Goal status: MET 03/30/2023  LONG TERM GOALS: UPDATED Target date: 04/24/2023  Pt will be independent with final HEP for improved balance, transfers, and gait. Baseline:  Goal status: IN PROGRESS  2.  Pt will perform 8 of 10 reps of sit to stand with no retropulsion, using BUEs as needed,  for improved safety with transfers. Baseline: without UE support, pt has retropulsion 5 of 5 trials Goal status: IN PROGRESS  3.  Pt will perform 360 turn to R and L in 8 steps or less for improved balance. Baseline: 10-11 steps Goal status: IN PROGRESS  4.  Pt will verbalize understanding of continued community fitness to maximize gains made in PT. Baseline:  Goal status: IN PROGRESS  5.  Pt will ambulate at least 500 ft, indoor/outdoor surfaces with appropriate assistive device (cane vs walker), mod I, for improved safety with community gait. Baseline:  Goal status: IN PROGRESS    ASSESSMENT:  CLINICAL IMPRESSION: Training in use of rollator in strategies for sit to stand using device for external cue/support for trunk flexion over BOS with decrease in retropulsion using strategies. Gait training with devices to improve safety and stability with various surfaces demonstrating good ability to sustain stride length with 4WW vs cane and no freezing of gait appreciated with device. Continued session with static multisensory balance challenges to improve postural stability/awareness and dynamic strategies to facilitate righting reactions. Continued sessions to progress POC details to improve mobility and reduce risk fro falls  OBJECTIVE IMPAIRMENTS: Abnormal gait, decreased balance, decreased knowledge of use of DME, decreased mobility, difficulty walking, decreased ROM, decreased strength, impaired flexibility, and postural dysfunction.   ACTIVITY LIMITATIONS: standing, squatting, transfers, and locomotion level  PARTICIPATION LIMITATIONS: community activity and yard work  PERSONAL FACTORS: 3+ comorbidities: see above  are also affecting patient's functional outcome.   REHAB POTENTIAL: Good  CLINICAL DECISION MAKING: Evolving/moderate complexity  EVALUATION COMPLEXITY: Moderate  PLAN:  PT FREQUENCY: 2x/week  PT DURATION: 8 weeks plus eval  PLANNED INTERVENTIONS:  97110-Therapeutic exercises, 97530- Therapeutic activity, 97112- Neuromuscular re-education, 97535- Self Care, 16109- Manual therapy, [phone]- Gait training, Patient/Family education, Balance training, and DME instructions  PLAN FOR NEXT SESSION: Sit to stand with chairs without arms to simulate household chairs; gait training with cane (or consider rollator/U-step) Update HEP as needed for targeted sit to stand/squats/balance strategies.     12:37 PM, 04/06/23 M. Shary Decamp, PT, DPT Physical Therapist- [place] Office Number: [phone]

## 2023-04-07 ENCOUNTER — Other Ambulatory Visit: Payer: Self-pay

## 2023-04-07 DIAGNOSIS — G20A1 Parkinson's disease without dyskinesia, without mention of fluctuations: Secondary | ICD-10-CM

## 2023-04-07 NOTE — Therapy (Signed)
 OUTPATIENT PHYSICAL THERAPY NEURO TREATMENT NOTE   Patient Name: Cody Dickerson MRN: [id number] DOB:1950/11/15, 73 y.o., male Today's Date: 04/08/2023   PCP: Okey Carlin Redbird, MD REFERRING PROVIDER: Tat, Asberry RAMAN, DO          END OF SESSION:  PT End of Session - 04/08/23 1010     Visit Number 22    Number of Visits 25    Date for PT Re-Evaluation 04/24/23    Authorization Type Medicare/AARP    Progress Note Due on Visit 29   (completed visit 19)   PT Start Time 0930    PT Stop Time 1010    PT Time Calculation (min) 40 min    Equipment Utilized During Treatment Gait belt    Activity Tolerance Patient tolerated treatment well    Behavior During Therapy WFL for tasks assessed/performed                             Past Medical History:  Diagnosis Date   Balance problem    Gout    Hypercholesterolemia    Hypertension    Parkinson's disease (HCC)    Small bowel obstruction (HCC)    Past Surgical History:  Procedure Laterality Date   BOWEL BLOCKAGE  05/2019   IR ANGIO INTRA EXTRACRAN SEL COM CAROTID INNOMINATE BILAT MOD SED  10/03/2021   IR ANGIO VERTEBRAL SEL VERTEBRAL UNI R MOD SED  10/03/2021   IR RADIOLOGIST EVAL & MGMT  08/19/2021   IR US  GUIDE VASC ACCESS RIGHT  10/04/2021   Patient Active Problem List   Diagnosis Date Noted   Pain due to onychomycosis of toenails of both feet 09/03/2022   Porokeratosis 06/04/2022   Parkinson's disease (HCC) 02/06/2021   SBO (small bowel obstruction) (HCC) 07/06/2019    ONSET DATE: 12/15/2022 (MD referral)  REFERRING DIAG:  R29.3 (ICD-10-CM) - Abnormal posture  R27.8 (ICD-10-CM) - Other lack of coordination  R25.1 (ICD-10-CM) - Tremor  M62.81 (ICD-10-CM) - Muscle weakness (generalized)  R29.898 (ICD-10-CM) - Other symptoms and signs involving the musculoskeletal system  R26.81 (ICD-10-CM) - Unsteadiness on feet  R26.89 (ICD-10-CM) - Other abnormalities of gait and mobility    THERAPY DIAG:  Other  symptoms and signs involving the nervous system  Unsteadiness on feet  Muscle weakness (generalized)  Abnormal posture  Other abnormalities of gait and mobility  Rationale for Evaluation and Treatment: Rehabilitation  SUBJECTIVE:  SUBJECTIVE STATEMENT: Nothing so far. Denies falls since last session.   Pt accompanied by: self  PERTINENT HISTORY: Left distal supraclinoid ICA aneurysm, MCI  PAIN:  Are you having pain? Yes: NPRS scale: 0-2/10 Pain location: low back Pain description: aching Aggravating factors: shoveling snow, standing  Relieving factors: sitting  PRECAUTIONS: Fall  RED FLAGS: None   WEIGHT BEARING RESTRICTIONS: No  FALLS: Has patient fallen in last 6 months? Yes. Number of falls 3-4 -Able to get up on his own from falls, though slowly per report.    LIVING ENVIRONMENT: Lives with: lives with their spouse; pt has to help her at times Lives in: House/apartment Stairs: Yes: External: 3 steps; on right going up Has following equipment at home: None  PLOF: Independent; Spin class 1x/wk, has tried [employer]; walks several times per week; enjoys yard work  PATIENT GOALS: Pt's goals for therapy to get up better from chairs, have better balance.  OBJECTIVE:     TODAY'S TREATMENT: 04/08/23 Activity Comments  sitting trunk flexion before STS Priming for anterior trunk lean with STS  STS focusing on no retropulsion  Reviewed STS technique ; cueing to reset back to tall sitting posture to allow full trunk lean excursion; better success today     sidestepping onto/off foam Weaned from B to no UE support; CGA and verbal cues required to maintain safe foot placement   Gait training with 4WW outside on sidewalk, ramp, curbs 539ft Reminders to stay close to walker (keep elbows  bent) and on locking brakes before transfers and navigating curbs   alt posterior steps in resisted pulley  facing pulley, then facing away) 15# and CGA-min A. Verbal cues and eye boost for posture to avoid retropulsion. Also cues for longer steps. Unable to maintain stability facing away with 15#, thus dropped down to 10#     PATIENT EDUCATION: Education details: printed order from Dr. Evonnie for 5TT from patient's chart with his consent and educated on how to/where to obtain 4WW Person educated: Patient Education method: Explanation, Demonstration, Tactile cues, Verbal cues, and Handouts Education comprehension: verbalized understanding and returned demonstration    Access Code: [email or web address] URL: [email or web address] Date: 02/20/2023 Prepared by: [facility] - Outpatient  Rehab - [facility]  Exercises - Seated Hamstring Stretch  - 1-2 x daily - 7 x weekly - 1 sets - 3 reps - 30 sec hold - Standing Gastroc Stretch at Counter  - 1-2 x daily - 7 x weekly - 1 sets - 3 reps - 30sec hold - Seated Active Hip Flexion  - 1 x daily - 5 x weekly - 2 sets - 10 reps - Sit to Stand with Armchair  - 1 x daily - 7 x weekly - 3 sets - 5 reps - Mini Squat with Counter Support  - 1 x daily - 5 x weekly - 3 sets - 10 reps - Alternating Step Backward with Support  - 1 x daily - 5 x weekly - 2 sets - 10 reps - Side Stepping with Counter Support  - 1 x daily - 5 x weekly - 2 sets - 10 reps - Standing Quarter Turn with Counter Support  - 1-2 x daily - 7 x weekly - 1 sets - 3-5 reps    ----------------------------------------------------------------- Note: Objective measures below were completed at Evaluation unless otherwise noted.  DIAGNOSTIC FINDINGS: NA per this episode  COGNITION: Overall cognitive status:  hx of MCI per MD note; [facility] for session today  SENSATION: WFL  COORDINATION: Slowed RAM with toe tapping  EDEMA:  1+pitting edema BLEs MD is aware-has recommended  compression stocking  MUSCLE TONE: Mild increase LLE tone  POSTURE: rounded shoulders, forward head, and posterior pelvic tilt  LOWER EXTREMITY ROM:     Active  Right Eval Left Eval  Hip flexion    Hip extension    Hip abduction    Hip adduction    Hip internal rotation    Hip external rotation    Knee flexion    Knee extension    Ankle dorsiflexion 10 8  Ankle plantarflexion    Ankle inversion    Ankle eversion     (Blank rows = not tested)    TRANSFERS: Assistive device utilized: None  Sit to stand: CGA and without use of BUEs, he has posterior lean, not as prominent with use of BUEs Stand to sit: CGA   GAIT: Gait pattern: step through pattern, decreased step length- Right, decreased step length- Left, shuffling, trunk flexed, and narrow BOS Distance walked: 50 ft x 2 Assistive device utilized: None Level of assistance: SBA  FUNCTIONAL TESTS:  5 times sit to stand: 18 sec with 3 of 5 episodes of posterior lean Timed up and go (TUG): 12.41 sec 10 meter walk test: 10 sec  MiniBESTest:  14/28 TUG cognitive:  23.25 esc TUG manual:  12.94 sec 360 turn R:  13 steps, 4.66 sec 360 turn L:  12 steps, 4.44 sec 86M back:  7.34 sec, small, quick, narrow BOS steps        GOALS: Goals reviewed with patient? Yes  SHORT TERM GOALS: Target date: 01/23/2023  Pt will be independent with HEP for improved balance, strength, gait. Baseline: Goal status: MET 01/22/2023  2.  Pt will improve 5x sit<>stand to less than or equal to 15 sec with no posterior lean to demonstrate improved functional strength and transfer efficiency. Baseline: 18 sec, 3 of 5 reps posterior lean>15.13 sec with UE support at chair, no posterior lean Goal status: MET 01/22/2023  3.  Pt will improve TUG cognitive score to less than or equal to 18 sec for decreased fall risk. Baseline: 23.45 sec>15.53 sec Goal status: MET 01/22/2023   LONG TERM GOALS: Target date: 02/20/2023>03/27/2023  Pt will  be independent with progression of HEP for improved balance, strength, gait. Baseline: update given 02/20/2023; needs cues 03/30/2023 Goal status: NOT MET 03/30/2023  2.  Pt will improve 5x sit<>stand to less than or equal to 12.5 sec and no posterior lean to demonstrate improved functional strength and transfer efficiency. Baseline: 22.44 sec with initial posterior lean (hands on knees)>16.43 sec 03/30/2023; 27 sec wit posterior lean no UE support Goal status: NOT  MET 03/30/2023  3.  Pt will improve MiniBESTest score to at least 19/28 to decrease fall risk. Baseline: 14/28>17/28 02/2023>15/28 03/30/2023 Goal status: NOT MET 03/30/2023  4.  Pt will perform 360 turn to R and L in 8 steps or less for improved balance. Baseline: 12-13 steps; 12-14 steps with supervision 02/18/2023>10-11 steps with min guard Goal status: NOT YET MET, IN PROGRESS 02/18/2023;IN PROGRESS 03/30/2023  5.  Pt will perform posterior push and release test in 2 steps or less, for improved posterior balance recovery. Baseline: 2-3 steps Goal status: IN PROGRESS, 03/30/2023  6.  Pt will verbalize understanding of local Parkinson's disease community resources, including optimal fitness program. Baseline: info provided 03/30/2023 Goal status: MET 03/30/2023  LONG TERM GOALS: UPDATED Target date: 04/24/2023  Pt will  be independent with final HEP for improved balance, transfers, and gait. Baseline:  Goal status: IN PROGRESS  2.  Pt will perform 8 of 10 reps of sit to stand with no retropulsion, using BUEs as needed, for improved safety with transfers. Baseline: without UE support, pt has retropulsion 5 of 5 trials Goal status: IN PROGRESS  3.  Pt will perform 360 turn to R and L in 8 steps or less for improved balance. Baseline: 10-11 steps Goal status: IN PROGRESS  4.  Pt will verbalize understanding of continued community fitness to maximize gains made in PT. Baseline:  Goal status: IN PROGRESS  5.  Pt will ambulate  at least 500 ft, indoor/outdoor surfaces with appropriate assistive device (cane vs walker), mod I, for improved safety with community gait. Baseline:  Goal status: IN PROGRESS    ASSESSMENT:  CLINICAL IMPRESSION: Patient arrived to session without new complaints and denied recent falls. Reviewed STS technique with better success today. Gait training with [phone] reviewed safety with this new device; patient was very responsive to cueing and appeared safe with this device. Continued with resisted stepping to address retropulsion which required modifications for safety/stability. Patient tolerated session ell and without complaints upon leaving.   OBJECTIVE IMPAIRMENTS: Abnormal gait, decreased balance, decreased knowledge of use of DME, decreased mobility, difficulty walking, decreased ROM, decreased strength, impaired flexibility, and postural dysfunction.   ACTIVITY LIMITATIONS: standing, squatting, transfers, and locomotion level  PARTICIPATION LIMITATIONS: community activity and yard work  PERSONAL FACTORS: 3+ comorbidities: see above  are also affecting patient's functional outcome.   REHAB POTENTIAL: Good  CLINICAL DECISION MAKING: Evolving/moderate complexity  EVALUATION COMPLEXITY: Moderate  PLAN:  PT FREQUENCY: 2x/week  PT DURATION: 8 weeks plus eval  PLANNED INTERVENTIONS: 97110-Therapeutic exercises, 97530- Therapeutic activity, 97112- Neuromuscular re-education, 97535- Self Care, [id number]- Manual therapy, [phone]- Gait training, Patient/Family education, Balance training, and DME instructions  PLAN FOR NEXT SESSION: Sit to stand with chairs without arms to simulate household chairs; gait training with cane (or consider rollator/U-step) Update HEP as needed for targeted sit to stand/squats/balance strategies.      Louana Terrilyn Christians, PT, DPT 04/08/23 10:13 AM  [facility] [address] [place], [address] [place], [place]  [address] Phone # [phone] Fax # [phone]

## 2023-04-07 NOTE — Telephone Encounter (Signed)
 Printed and on your desk

## 2023-04-08 ENCOUNTER — Ambulatory Visit: Payer: Medicare Other | Admitting: Occupational Therapy

## 2023-04-08 ENCOUNTER — Encounter: Payer: Self-pay | Admitting: Physical Therapy

## 2023-04-08 ENCOUNTER — Ambulatory Visit: Payer: Medicare Other | Admitting: Physical Therapy

## 2023-04-08 DIAGNOSIS — R2681 Unsteadiness on feet: Secondary | ICD-10-CM | POA: Diagnosis not present

## 2023-04-08 DIAGNOSIS — R293 Abnormal posture: Secondary | ICD-10-CM | POA: Diagnosis not present

## 2023-04-08 DIAGNOSIS — R29818 Other symptoms and signs involving the nervous system: Secondary | ICD-10-CM

## 2023-04-08 DIAGNOSIS — R2689 Other abnormalities of gait and mobility: Secondary | ICD-10-CM

## 2023-04-08 DIAGNOSIS — M6281 Muscle weakness (generalized): Secondary | ICD-10-CM | POA: Diagnosis not present

## 2023-04-08 DIAGNOSIS — R278 Other lack of coordination: Secondary | ICD-10-CM | POA: Diagnosis not present

## 2023-04-08 DIAGNOSIS — R41840 Attention and concentration deficit: Secondary | ICD-10-CM

## 2023-04-08 NOTE — Therapy (Signed)
 OUTPATIENT OCCUPATIONAL THERAPY PARKINSON'S  Treatment Note   Patient Name: Cody Dickerson MRN: [id number] DOB:11/28/1950, 73 y.o., male Today's Date: 04/08/2023  PCP: Okey Carlin Redbird, MD  REFERRING PROVIDER: Tat, Asberry RAMAN, DO   END OF SESSION:  OT End of Session - 04/08/23 1107     Visit Number 14    Number of Visits 24    Date for OT Re-Evaluation 05/01/23    Authorization Type Medicare A&B    Progress Note Due on Visit 10    OT Start Time 1016    OT Stop Time 1058    OT Time Calculation (min) 42 min    Activity Tolerance Patient tolerated treatment well    Behavior During Therapy WFL for tasks assessed/performed                          Past Medical History:  Diagnosis Date   Balance problem    Gout    Hypercholesterolemia    Hypertension    Parkinson's disease (HCC)    Small bowel obstruction (HCC)    Past Surgical History:  Procedure Laterality Date   BOWEL BLOCKAGE  05/2019   IR ANGIO INTRA EXTRACRAN SEL COM CAROTID INNOMINATE BILAT MOD SED  10/03/2021   IR ANGIO VERTEBRAL SEL VERTEBRAL UNI R MOD SED  10/03/2021   IR RADIOLOGIST EVAL & MGMT  08/19/2021   IR US  GUIDE VASC ACCESS RIGHT  10/04/2021   Patient Active Problem List   Diagnosis Date Noted   Pain due to onychomycosis of toenails of both feet 09/03/2022   Porokeratosis 06/04/2022   Parkinson's disease (HCC) 02/06/2021   SBO (small bowel obstruction) (HCC) 07/06/2019    ONSET DATE: 12/15/22 (referral date),  Pt reports Parkinson's dx approx. 3 years ago (approx. 2021)  REFERRING DIAG:   R29.3 (ICD-10-CM) - Abnormal posture  R27.8 (ICD-10-CM) - Other lack of coordination  R25.1 (ICD-10-CM) - Tremor  M62.81 (ICD-10-CM) - Muscle weakness (generalized)  R29.898 (ICD-10-CM) - Other symptoms and signs involving the musculoskeletal system  R26.81 (ICD-10-CM) - Unsteadiness on feet  R26.89 (ICD-10-CM) - Other abnormalities of gait and mobility    THERAPY DIAG:  Other symptoms and  signs involving the nervous system  Unsteadiness on feet  Other lack of coordination  Attention and concentration deficit  Rationale for Evaluation and Treatment: Rehabilitation  SUBJECTIVE:   SUBJECTIVE STATEMENT: Pt reported things are going pretty good, my handwriting is terrible and my buttoning ability is reduced. 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  Pt accompanied by: self  PERTINENT HISTORY: 08/12/22 MD Progress Notes and 08/18/22 DO Progress Notes: Parkinson's disease, left distal supraclinoid ICA aneurysm (aneurysm size stable), MCI, HTN, gout, hypercholesterolemia  PRECAUTIONS: Fall, posterior lean when standing/walking   WEIGHT BEARING RESTRICTIONS: No  PAIN:  Are you having pain? Yes 2/10 lower back pain from shoveling snow a few weeks ago  FALLS: Has patient fallen in last 6 months? Yes. Number of falls Pt reports approx. 4 falls. Pt reports picking up sticks in yard when 2 falls occurred.  Per 12/11/22 PT Screen: Pt reports Balance is bad. Have had several falls in the yard.   LIVING ENVIRONMENT: Lives with: lives with their spouse Lives in: House/apartment Stairs: Yes: External: 3 steps; on left going up Has following equipment at home: Grab bars  PLOF: Independent with basic ADLs, pt currently drives  PATIENT GOALS: to do learn how to do things better.  OBJECTIVE:  Note: Objective measures were completed at Evaluation unless otherwise noted.  HAND DOMINANCE: Right  ADLs: Overall ADLs:  Transfers/ambulation related to ADLs: Eating: ind, pt reports slower and more meticulous to get items on fork. No difficulty with cutting and serving food. Grooming: Pt reports shaving with standard razor blade is slower. Pt has electric shaver available though pt has not attempted to use it. UB Dressing: Pt reports Not as good putting jacket on as I used to be. Pt currently using cape method to don jacket, a strategy learned in previous OT  sessions. Increased difficulty with buttons: I have more trouble with buttoning buttons placed in front of me than when a shirt is on me. Pt reports taking extra time to work out zippers if zippers get stuck. LB Dressing: Getting my pant legs on and off are a little slower but it's acceptable. Toileting: Ind Bathing: Ind with someone else in the home for safety Tub Shower transfers: ind Equipment: Grab bars, Sports Administrator, and tub/shower  IADLs: Shopping: spouse completes grocery shopping, pt helps out with carrying heavy water Light housekeeping: pt folds laundry without difficulty Meal Prep: Ind with cutting, pt completes charcoal grilling tasks and pt reports being careful and walking slow around grill for safety. 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 Handwriting: Pt reports handwriting is slower than it used to be, and even slower if he focuses on writing legibly.   Pt wrote [place] live [place] in 18 seconds. Increased time required and approx. 66% legible.  MOBILITY STATUS: Ind without A/E. PT reported to OT that pt demo's significant posterior lean when standing and walking. Pt reports imbalance tends to go backwards.  POSTURE COMMENTS:  rounded shoulders and forward head  ACTIVITY TOLERANCE: Activity tolerance: Pt reports increased fatigue and sleepiness during the day. Pt reports sleeping well at night. Pt reports increased fatigue may be d/t medication though pt is unsure.  FUNCTIONAL OUTCOME MEASURES: 03/19/23 Fastening/unfastening 3 buttons: 2:23 (however last button required 55 seconds to unfasten). OT noted mild tremors of BUE when completing task.  Physical performance test: PPT#2 (simulated eating) 15.22 seconds. OT noted mild tremors of RUE when completing task.  PPT#4 (donning/doffing jacket): 22.31  sec   Fastening/unfastening 3 buttons: 98 seconds. OT noted mild tremors of BUE when completing task.  Physical performance test: PPT#2 (simulated eating) 17 seconds. OT noted mild tremors of RUE when completing task.  PPT#4 (donning/doffing jacket): 18 seconds. With gait belt. OT noted mild tremors of BUE during task.  COORDINATION: 03/19/23 9 Hole Peg test: Right: 46.56 sec; Left: 49.85 sec Box and Blocks:  Right 37 blocks, Left 40 blocks  01/22/23: 9 Hole Peg test: Right: 43.5 sec; Left: 41.25 sec Box and Blocks:  Right 38 blocks, Left 38 blocks  EVAL: 9 Hole Peg test: Right: 36 sec; Left: 36 sec Box and Blocks:  Right 34 blocks, Left 41 blocks  UE ROM:    BUE shoulders flex/ABD - WFL.  BUE elbow flex/ext - WFL with v/c to fully ext. R elbow ext slower than L elbow ext.  Wrist flex/ext - WFL, v/c to fully ext.  Digits flex/ext - WFL, v/c to fully ext thumbs with slightly decreased AROM of B thumbs noted.  Note: Pt demo'd decreased speed to attain full AROM of BUE.   UE MMT:   Not tested  SENSATION: WFL Pt reports no change.  COGNITION: Overall cognitive status: Within functional limits for tasks assessed. Pt reports more difficulty remembering day of the week, taking medication at 4 PM specifically. Pt reports using calendar to help with memory.   OBSERVATIONS: Pt was pleasant and agreeable. Pt recognized difficulty with ADL/IADL tasks and described current level of functioning well. OT noted Bradykinesia and mild tremors with FM tasks . Pt donned reading glasses for handwriting task. Pt with good recall of techniques and strategies from previous OT session, motivated to participate in [facility] tasks.   TODAY'S TREATMENT:                                                                      DATE:   Self Care OT educated pt on adaptive strategies and A/E for ADL/IADL tasks, including button hook, built-up writing utensils, abbreviating names/dates to allow additional  space to write, and upright seated posture for improved distal BUE stability. Pt returned demonstration of strategies throughout tasks (see below).  Handwriting on lined paper then practice writing simulated checks - - to improve understanding of adaptive strategies and A/E, to improve FM coordination and dexterity for handwriting, to improve large amplitude movements. Pt benefited from education, v/c and visual cues (3-lined paper) for large letter size, decreasing speed, UC letters, and print style handwriting with greatly improved legibility by end of task: 100% legibility. Pt demo'd better quality of writing strokes without built-up writing utensil and therefore used standard pen for majority of task.  Buttoning task - to improve understanding of adaptive strategies and A/E for ADL UB dressing tasks, to improve FM coordination and dexterity. Pt required extra time for buttoning without A/E. Pt demo'd improved efficiency with button hook. Pt benefited from v/c and therapist modeling for unbuttoning to improve efficiency.   Pt benefited from v/c for sit-to-stand transfer and for large steps when ambulating to improve safety and decrease fall risk.   PATIENT EDUCATION: Education details:  see today's tx above, PD specific education for large amplitude/coordination Person educated: Patient Education method: Explanation and Handouts Education comprehension: verbalized understanding  HOME EXERCISE PROGRAM: 01/20/23 - coordination exercises (see pt instructions)  01/15/23 - bag exercises (see pt instructions)  01/08/23 - large amplitude hands (see pt instructions)  GOALS: Goals reviewed with patient? Yes  GOALS:  SHORT TERM GOALS: Target date: 01/23/23    Pt will be independent with PD specific HEP. Baseline: not yet initiated Goal status: MET - completing large amplitude, bag exercises, and coordination HEP on 01/22/23  2.  Pt will verbalize understanding of adapted strategies to  maximize safety and independence with ADLs/IADLs. Baseline: not yet initiated Goal status: MET - 01/22/23 3.  Pt will write a simple sentence with at least 75% legibility and complete the sentence in 15 seconds or less.  Baseline: 66% legibility, 18 seconds for simple sentence Goal status: PARTIALLY MET - required increased time (26 sec) however improved legibility to 75% on 01/22/23  4.  Pt will demonstrate improved fine motor coordination for ADLs as evidenced by completing 9 hole peg test score for BUE in 30 seconds or less. Baseline: 9 Hole Peg test: Right: 36 sec; Left: 36 sec Goal status: NOT MET - Right: 43.5 sec; Left: 41.25 sec on 01/22/23   5.  Pt will be able to place at least 45 blocks with BUE hand with completion of Box and Blocks test. Baseline: Right 34 blocks, Left 41 blocks Goal status: NOT MET - Completed R: 38 and L: 38 on 01/22/23    NEW LONG TERM GOALS: Target date: 04/30/22    Pt will verbalize understanding of ways to prevent future PD related complications and PD community resources. Baseline: not yet initiated Goal status: IN PROGRESS  2.  Pt will write a simulated/sample check with at least 85% legibility while maintaining steady speed. Baseline: 66% legibility for simple sentence, 18 seconds for simple sentence   03/19/23 - 75% legibility, 30 seconds for simple sentence Goal status: IN PROGRESS  3.  Pt will demonstrate improved ease with fastening buttons as evidenced by decreasing 3 button/unbutton time to 80 seconds or less. Baseline: 98 seconds 03/19/23 - 2:23 (last button required 55 seconds to unfasten) Goal status: IN PROGRESS  4.  Pt will verbalize understanding of ways to keep thinking skills sharp and ways to compensate for STM changes in the future. Baseline: not yet initiated Goal status: IN PROGRESS  5.  Pt will demonstrate increased ease with dressing as evidenced by decreasing PPT#4 (don/ doff jacket) to 15 secs or less. Baseline: PPT #4:  18 secs 03/19/23 - 22:31 sec Goal status: IN PROGRESS    ASSESSMENT:  CLINICAL IMPRESSION: Pt tolerated tasks well and demo'd improved legibility when using adaptive strategies for handwriting. Pt benefited from v/c for upright posture when seated/standing.  Pt demo'd improved efficiency with buttoning tasks following therapist modeling and v/c for unbuttoning and when using button hook for buttoning.  Pt will benefit from continued skilled occupational therapy services to address coordination, ROM, balance, GM/FM control, safety awareness, introduction of compensatory strategies/AE prn, and implementation of an HEP to improve participation and safety during ADLs, IADLs, and quality of life.   PERFORMANCE DEFICITS: in functional skills including ADLs, IADLs, coordination, dexterity, proprioception, ROM, strength, Fine motor control, Gross motor control, mobility, balance, body mechanics, endurance, and UE functional use, cognitive skills including energy/drive and memory, and psychosocial skills including environmental adaptation.   IMPAIRMENTS: are limiting patient from ADLs, IADLs, leisure, and social participation.     PLAN:  OT FREQUENCY: 2x/week  OT DURATION: 6 weeks (dates extended to allow for scheduling)  PLANNED INTERVENTIONS: 97168 OT Re-evaluation, 97535 self care/ADL training, 02889 therapeutic exercise, 97530 therapeutic activity, 97112 neuromuscular re-education, 97140 manual therapy, 97035 ultrasound, 97018 paraffin, 02960 fluidotherapy, 97010 moist heat, 97010 cryotherapy, 97032 electrical stimulation (manual), 97014 electrical stimulation unattended, 97760 Orthotics management and training, 02239 Splinting (initial encounter), [id number] Subsequent splinting/medication, passive range of motion, functional mobility training, energy conservation, patient/family education, and DME and/or AE instructions  RECOMMENDED OTHER SERVICES: PT eval already completed on 12/24/22.  CONSULTED  AND AGREED WITH PLAN OF CARE: Patient  PLAN FOR NEXT SESSION:   Large amplitude movements  Bag exercises for UB and LB dressing  Continue FM tasks - buttons, other FMC tasks, handwriting  Donning/doffing jacket - review strategies (e.g. cape method)   Geofm FORBES Coder, OT 04/08/2023, 11:12 AM

## 2023-04-09 ENCOUNTER — Telehealth: Payer: Self-pay

## 2023-04-09 NOTE — Telephone Encounter (Signed)
 Encounter error

## 2023-04-13 ENCOUNTER — Ambulatory Visit: Payer: Medicare Other

## 2023-04-13 ENCOUNTER — Ambulatory Visit: Payer: Medicare Other | Admitting: Occupational Therapy

## 2023-04-13 DIAGNOSIS — M6281 Muscle weakness (generalized): Secondary | ICD-10-CM | POA: Diagnosis not present

## 2023-04-13 DIAGNOSIS — R2681 Unsteadiness on feet: Secondary | ICD-10-CM | POA: Diagnosis not present

## 2023-04-13 DIAGNOSIS — R293 Abnormal posture: Secondary | ICD-10-CM | POA: Diagnosis not present

## 2023-04-13 DIAGNOSIS — R2689 Other abnormalities of gait and mobility: Secondary | ICD-10-CM

## 2023-04-13 DIAGNOSIS — R29818 Other symptoms and signs involving the nervous system: Secondary | ICD-10-CM | POA: Diagnosis not present

## 2023-04-13 DIAGNOSIS — R278 Other lack of coordination: Secondary | ICD-10-CM | POA: Diagnosis not present

## 2023-04-13 DIAGNOSIS — R41840 Attention and concentration deficit: Secondary | ICD-10-CM

## 2023-04-13 NOTE — Therapy (Signed)
 OUTPATIENT OCCUPATIONAL THERAPY PARKINSON'S  Treatment Note   Patient Name: Cody Dickerson MRN: [id number] DOB:September 01, 1950, 73 y.o., male Today's Date: 04/13/2023  PCP: Jimmey Mould, MD  REFERRING PROVIDER: Tat, Von Grumbling, DO   END OF SESSION:  OT End of Session - 04/13/23 1147     Visit Number 15    Number of Visits 24    Date for OT Re-Evaluation 05/01/23    Authorization Type Medicare A&B    Progress Note Due on Visit 10    OT Start Time 1146    OT Stop Time 1228    OT Time Calculation (min) 42 min    Activity Tolerance Patient tolerated treatment well    Behavior During Therapy WFL for tasks assessed/performed                           Past Medical History:  Diagnosis Date   Balance problem    Gout    Hypercholesterolemia    Hypertension    Parkinson's disease (HCC)    Small bowel obstruction (HCC)    Past Surgical History:  Procedure Laterality Date   BOWEL BLOCKAGE  05/2019   IR ANGIO INTRA EXTRACRAN SEL COM CAROTID INNOMINATE BILAT MOD SED  10/03/2021   IR ANGIO VERTEBRAL SEL VERTEBRAL UNI R MOD SED  10/03/2021   IR RADIOLOGIST EVAL & MGMT  08/19/2021   IR US  GUIDE VASC ACCESS RIGHT  10/04/2021   Patient Active Problem List   Diagnosis Date Noted   Pain due to onychomycosis of toenails of both feet 09/03/2022   Porokeratosis 06/04/2022   Parkinson's disease (HCC) 02/06/2021   SBO (small bowel obstruction) (HCC) 07/06/2019    ONSET DATE: 12/15/22 (referral date),  Pt reports Parkinson's dx approx. 3 years ago (approx. 2021)  REFERRING DIAG:   R29.3 (ICD-10-CM) - Abnormal posture  R27.8 (ICD-10-CM) - Other lack of coordination  R25.1 (ICD-10-CM) - Tremor  M62.81 (ICD-10-CM) - Muscle weakness (generalized)  R29.898 (ICD-10-CM) - Other symptoms and signs involving the musculoskeletal system  R26.81 (ICD-10-CM) - Unsteadiness on feet  R26.89 (ICD-10-CM) - Other abnormalities of gait and mobility    THERAPY DIAG:  Other symptoms and  signs involving the nervous system  Unsteadiness on feet  Muscle weakness (generalized)  Other lack of coordination  Attention and concentration deficit  Rationale for Evaluation and Treatment: Rehabilitation  SUBJECTIVE:   SUBJECTIVE STATEMENT: Pt reports writing checks over the weekend and stated that he made changes based on recommendations from previous OT.    Pt accompanied by: self  PERTINENT HISTORY: 08/12/22 MD Progress Notes and 08/18/22 DO Progress Notes: Parkinson's disease, left distal supraclinoid ICA aneurysm (aneurysm size stable), MCI, HTN, gout, hypercholesterolemia  PRECAUTIONS: Fall, posterior lean when standing/walking   WEIGHT BEARING RESTRICTIONS: No  PAIN:  Are you having pain? Yes 2/10 lower back pain from shoveling snow a few weeks ago  FALLS: Has patient fallen in last 6 months? Yes. Number of falls Pt reports approx. 4 falls. Pt reports picking up sticks in yard when 2 falls occurred.  Per 12/11/22 PT Screen: Pt reports "Balance is bad". Have had several falls in the yard.   LIVING ENVIRONMENT: Lives with: lives with their spouse Lives in: House/apartment Stairs: Yes: External: 3 steps; on left going up Has following equipment at home: Grab bars  PLOF: Independent with basic ADLs, pt currently drives  PATIENT GOALS: "to do learn how to do things better."  OBJECTIVE:  Note: Objective measures were completed at Evaluation unless otherwise noted.  HAND DOMINANCE: Right  ADLs: Overall ADLs:  Transfers/ambulation related to ADLs: Eating: ind, pt reports slower and more meticulous to get items on fork. No difficulty with cutting and serving food. Grooming: Pt reports shaving with standard razor blade is slower. Pt has electric shaver available though pt has not attempted to use it. UB Dressing: Pt reports "Not as good putting jacket on as I used to be." Pt currently using cape method to don jacket, a strategy learned in previous OT sessions.  Increased difficulty with buttons: "I have more trouble with buttoning buttons placed in front of me than when a shirt is on me." Pt reports taking extra time to work out zippers if zippers get stuck. LB Dressing: "Getting my pant legs on and off are a little slower but it's acceptable." Toileting: Ind Bathing: Ind with someone else in the home for safety Tub Shower transfers: ind Equipment: Grab bars, Sports administrator, and tub/shower  IADLs: Shopping: spouse completes grocery shopping, pt helps out with carrying heavy water Light housekeeping: pt folds laundry without difficulty Meal Prep: Ind with cutting, pt completes charcoal grilling tasks and pt reports being careful and walking slow around grill for safety. 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 Handwriting: Pt reports handwriting is slower than it used to be, and even slower if he focuses on writing legibly.   Pt wrote "Whales live in the ocean" in 18 seconds. Increased time required and approx. 66% legible.  MOBILITY STATUS: Ind without A/E. PT reported to OT that pt demo's significant posterior lean when standing and walking. Pt reports "imbalance tends to go backwards."  POSTURE COMMENTS:  rounded shoulders and forward head  ACTIVITY TOLERANCE: Activity tolerance: Pt reports increased fatigue and sleepiness during the day. Pt reports sleeping well at night. Pt reports increased fatigue may be d/t medication though pt is unsure.  FUNCTIONAL OUTCOME MEASURES: 03/19/23 Fastening/unfastening 3 buttons: 2:23 (however last button required 55 seconds to unfasten). OT noted mild tremors of BUE when completing task.  Physical performance test: PPT#2 (simulated eating) 15.22 seconds. OT noted mild tremors of RUE when completing task.  PPT#4 (donning/doffing jacket): 22.31 sec   Fastening/unfastening  3 buttons: 98 seconds. OT noted mild tremors of BUE when completing task.  Physical performance test: PPT#2 (simulated eating) 17 seconds. OT noted mild tremors of RUE when completing task.  PPT#4 (donning/doffing jacket): 18 seconds. With gait belt. OT noted mild tremors of BUE during task.  COORDINATION: 03/19/23 9 Hole Peg test: Right: 46.56 sec; Left: 49.85 sec Box and Blocks:  Right 37 blocks, Left 40 blocks  01/22/23: 9 Hole Peg test: Right: 43.5 sec; Left: 41.25 sec Box and Blocks:  Right 38 blocks, Left 38 blocks  EVAL: 9 Hole Peg test: Right: 36 sec; Left: 36 sec Box and Blocks:  Right 34 blocks, Left 41 blocks  UE ROM:    BUE shoulders flex/ABD - WFL.  BUE elbow flex/ext - WFL with v/c to fully ext. R elbow ext slower than L elbow ext.  Wrist flex/ext - WFL, v/c to fully ext.  Digits flex/ext - WFL, v/c to fully ext thumbs with slightly decreased AROM of B thumbs noted.  Note: Pt demo'd decreased speed to attain full AROM of BUE.   UE MMT:   Not tested  SENSATION: WFL Pt reports no change.  COGNITION: Overall cognitive status: Within functional limits for tasks assessed. Pt reports more difficulty remembering day of the week, taking medication at 4 PM specifically. Pt reports using calendar to help with memory.   OBSERVATIONS: Pt was pleasant and agreeable. Pt recognized difficulty with ADL/IADL tasks and described current level of functioning well. OT noted Bradykinesia and mild tremors with FM tasks . Pt donned reading glasses for handwriting task. Pt with good recall of techniques and strategies from previous OT session, motivated to participate in [facility] tasks.   TODAY'S TREATMENT:                                                                      DATE:  04/13/23 Handwriting: engaged in massed practice with handwriting with focus on letter size, cursive vs print, and use of upper case when writing in print to increase legibility.  Pt utilizing personal pen  with good sizing throughout on lined paper and 90% legibility overall.  Pt recognizing tendency to write smaller as task continued, therefore reiterated using space available on lined paper.  OT also educating on use of return labels to decrease need for writing name and address on bills, especially as space for return address is quite small.   Self-care:  Buttoning: reviewed button hook and problem solving various styles of shirts and fabrics with pt reporting decreased difficulty with golf style shirt.  Will continue to benefit from practice with various styles/fabrics. Don/doff Jacket: engaged in massed practice with donning and doffing jacket.  Pt demonstrating good amplitude when donning jacket this session.  OT providing cues for hand placement and amplitude when doffing jacket.  Pt completed 5x with improved ease and amplitude by end of task.   PATIENT EDUCATION: Education details:  see today's tx above, PD specific education for large amplitude/coordination Person educated: Patient Education method: Explanation and Handouts Education comprehension: verbalized understanding  HOME EXERCISE PROGRAM: 01/20/23 - coordination exercises (see pt instructions)  01/15/23 - bag exercises (see pt instructions)  01/08/23 - large amplitude hands (see pt instructions)  GOALS: Goals reviewed with patient? Yes  GOALS:  SHORT TERM GOALS: Target date: 01/23/23    Pt will be independent with PD specific HEP. Baseline: not yet initiated Goal status: MET - completing large amplitude, bag exercises, and coordination HEP on 01/22/23  2.  Pt will verbalize understanding of adapted strategies to maximize safety and independence with ADLs/IADLs. Baseline: not yet initiated Goal status: MET - 01/22/23 3.  Pt will write a simple sentence with at least 75% legibility and complete the sentence in 15 seconds or less.  Baseline: 66% legibility, 18 seconds for simple sentence Goal status: PARTIALLY MET -  required increased time (26 sec) however improved legibility to 75% on 01/22/23  4.  Pt will demonstrate improved fine motor coordination for ADLs as evidenced by completing 9 hole peg test score for BUE in 30 seconds or less. Baseline: 9 Hole Peg test: Right: 36 sec; Left: 36 sec Goal status: NOT MET - Right: 43.5 sec; Left: 41.25 sec on 01/22/23   5.  Pt will be able to place at least 45 blocks with BUE hand with completion of Box and Blocks test. Baseline: Right 34 blocks, Left 41 blocks Goal status: NOT  MET - Completed R: 38 and L: 38 on 01/22/23    NEW LONG TERM GOALS: Target date: 04/30/22    Pt will verbalize understanding of ways to prevent future PD related complications and PD community resources. Baseline: not yet initiated Goal status: IN PROGRESS  2.  Pt will write a simulated/sample check with at least 85% legibility while maintaining steady speed. Baseline: 66% legibility for simple sentence, 18 seconds for simple sentence   03/19/23 - 75% legibility, 30 seconds for simple sentence Goal status: IN PROGRESS  3.  Pt will demonstrate improved ease with fastening buttons as evidenced by decreasing 3 button/unbutton time to 80 seconds or less. Baseline: 98 seconds 03/19/23 - 2:23 (last button required 55 seconds to unfasten) Goal status: IN PROGRESS  4.  Pt will verbalize understanding of ways to keep thinking skills sharp and ways to compensate for STM changes in the future. Baseline: not yet initiated Goal status: IN PROGRESS  5.  Pt will demonstrate increased ease with dressing as evidenced by decreasing PPT#4 (don/ doff jacket) to 15 secs or less. Baseline: PPT #4: 18 secs 03/19/23 - 22:31 sec Goal status: IN PROGRESS    ASSESSMENT:  CLINICAL IMPRESSION: Pt tolerated tasks well and continues to demonstrate improved legibility when using adaptive strategies for handwriting.  Pt demonstrating improved efficiency with donning/doffing jacket following therapist  modeling and cues for hand placement and amplitude when doffing jacket.  Pt will benefit from continued skilled occupational therapy services to address coordination, ROM, balance, GM/FM control, safety awareness, introduction of compensatory strategies/AE prn, and implementation of an HEP to improve participation and safety during ADLs, IADLs, and quality of life.   PERFORMANCE DEFICITS: in functional skills including ADLs, IADLs, coordination, dexterity, proprioception, ROM, strength, Fine motor control, Gross motor control, mobility, balance, body mechanics, endurance, and UE functional use, cognitive skills including energy/drive and memory, and psychosocial skills including environmental adaptation.   IMPAIRMENTS: are limiting patient from ADLs, IADLs, leisure, and social participation.     PLAN:  OT FREQUENCY: 2x/week  OT DURATION: 6 weeks (dates extended to allow for scheduling)  PLANNED INTERVENTIONS: 97168 OT Re-evaluation, 97535 self care/ADL training, 40981 therapeutic exercise, 97530 therapeutic activity, 97112 neuromuscular re-education, 97140 manual therapy, 97035 ultrasound, 97018 paraffin, 19147 fluidotherapy, 97010 moist heat, 97010 cryotherapy, 97032 electrical stimulation (manual), 97014 electrical stimulation unattended, 97760 Orthotics management and training, 82956 Splinting (initial encounter), [id number] Subsequent splinting/medication, passive range of motion, functional mobility training, energy conservation, patient/family education, and DME and/or AE instructions  RECOMMENDED OTHER SERVICES: PT eval already completed on 12/24/22.  CONSULTED AND AGREED WITH PLAN OF CARE: Patient  PLAN FOR NEXT SESSION:   Large amplitude movements  LB dressing - pt asking questions about sequencing and problem solving when undressing.  Bag exercises for UB and LB dressing  Continue FM tasks - buttons, other FMC tasks, handwriting    Anthonette Kinsman, OT 04/13/2023, 12:42 PM

## 2023-04-13 NOTE — Therapy (Signed)
 OUTPATIENT PHYSICAL THERAPY NEURO TREATMENT NOTE   Patient Name: Cody Dickerson MRN: [id number] DOB:January 15, 1951, 73 y.o., male Today's Date: 04/13/2023   PCP: Jimmey Mould, MD REFERRING PROVIDER: Tat, Von Grumbling, DO          END OF SESSION:  PT End of Session - 04/13/23 1110     Visit Number 23    Number of Visits 25    Date for PT Re-Evaluation 04/24/23    Authorization Type Medicare/AARP    Progress Note Due on Visit 29   (completed visit 19)   PT Start Time 1100    PT Stop Time 1145    PT Time Calculation (min) 45 min    Equipment Utilized During Treatment Gait belt    Activity Tolerance Patient tolerated treatment well    Behavior During Therapy WFL for tasks assessed/performed                             Past Medical History:  Diagnosis Date   Balance problem    Gout    Hypercholesterolemia    Hypertension    Parkinson's disease (HCC)    Small bowel obstruction (HCC)    Past Surgical History:  Procedure Laterality Date   BOWEL BLOCKAGE  05/2019   IR ANGIO INTRA EXTRACRAN SEL COM CAROTID INNOMINATE BILAT MOD SED  10/03/2021   IR ANGIO VERTEBRAL SEL VERTEBRAL UNI R MOD SED  10/03/2021   IR RADIOLOGIST EVAL & MGMT  08/19/2021   IR US  GUIDE VASC ACCESS RIGHT  10/04/2021   Patient Active Problem List   Diagnosis Date Noted   Pain due to onychomycosis of toenails of both feet 09/03/2022   Porokeratosis 06/04/2022   Parkinson's disease (HCC) 02/06/2021   SBO (small bowel obstruction) (HCC) 07/06/2019    ONSET DATE: 12/15/2022 (MD referral)  REFERRING DIAG:  R29.3 (ICD-10-CM) - Abnormal posture  R27.8 (ICD-10-CM) - Other lack of coordination  R25.1 (ICD-10-CM) - Tremor  M62.81 (ICD-10-CM) - Muscle weakness (generalized)  R29.898 (ICD-10-CM) - Other symptoms and signs involving the musculoskeletal system  R26.81 (ICD-10-CM) - Unsteadiness on feet  R26.89 (ICD-10-CM) - Other abnormalities of gait and mobility    THERAPY DIAG:  Other  symptoms and signs involving the nervous system  Unsteadiness on feet  Muscle weakness (generalized)  Abnormal posture  Other abnormalities of gait and mobility  Rationale for Evaluation and Treatment: Rehabilitation  SUBJECTIVE:  SUBJECTIVE STATEMENT: "Same old same"   Pt accompanied by: self  PERTINENT HISTORY: Left distal supraclinoid ICA aneurysm, MCI  PAIN:  Are you having pain? Yes: NPRS scale: 0-2/10 Pain location: low back Pain description: aching Aggravating factors: shoveling snow, standing  Relieving factors: sitting  PRECAUTIONS: Fall  RED FLAGS: None   WEIGHT BEARING RESTRICTIONS: No  FALLS: Has patient fallen in last 6 months? Yes. Number of falls 3-4 -Able to get up on his own from falls, though slowly per report.    LIVING ENVIRONMENT: Lives with: lives with their spouse; pt has to help her at times Lives in: House/apartment Stairs: Yes: External: 3 steps; on right going up Has following equipment at home: None  PLOF: Independent; Spin class 1x/wk, has tried [employer]; walks several times per week; enjoys yard work  PATIENT GOALS: Pt's goals for therapy to get up better from chairs, have better balance.  OBJECTIVE:   TODAY'S TREATMENT: 04/13/23 Activity Comments  NU-step speed intervals x 8 min 2 min warm-up. 30 sec sprint, 30 sec recovery. Maintained 90-110 SPM for duration  Transfer training To reduce retropulsion  Push-release Anterior Lateral Retro -large amplitude leg swings Retro  Gait training : 315 ft w/ rollator w/ dual-tasking +rollator: 270 ft  Balance training Activities for single limb support/stance and postural perturbations          TODAY'S TREATMENT: 04/08/23 Activity Comments  sitting trunk flexion before STS Priming for  anterior trunk lean with STS  STS focusing on no retropulsion  Reviewed STS technique ; cueing to reset back to tall sitting posture to allow full trunk lean excursion; better success today     sidestepping onto/off foam Weaned from B to no UE support; CGA and verbal cues required to maintain safe foot placement   Gait training with 4WW outside on sidewalk, ramp, curbs 534ft Reminders to stay close to walker (keep elbows bent) and on locking brakes before transfers and navigating curbs   alt posterior steps in resisted pulley  facing pulley, then facing away) 15# and CGA-min A. Verbal cues and eye boost for posture to avoid retropulsion. Also cues for longer steps. Unable to maintain stability facing away with 15#, thus dropped down to 10#     PATIENT EDUCATION: Education details: printed order from Dr. Winferd Hatter for 2VZ from patient's chart with his consent and educated on how to/where to obtain 4WW Person educated: Patient Education method: Explanation, Demonstration, Tactile cues, Verbal cues, and Handouts Education comprehension: verbalized understanding and returned demonstration    Access Code: [email or web address] URL: [email or web address] Date: 02/20/2023 Prepared by: [facility] - Outpatient  Rehab - [facility]  Exercises - Seated Hamstring Stretch  - 1-2 x daily - 7 x weekly - 1 sets - 3 reps - 30 sec hold - Standing Gastroc Stretch at Counter  - 1-2 x daily - 7 x weekly - 1 sets - 3 reps - 30sec hold - Seated Active Hip Flexion  - 1 x daily - 5 x weekly - 2 sets - 10 reps - Sit to Stand with Armchair  - 1 x daily - 7 x weekly - 3 sets - 5 reps - Mini Squat with Counter Support  - 1 x daily - 5 x weekly - 3 sets - 10 reps - Alternating Step Backward with Support  - 1 x daily - 5 x weekly - 2 sets - 10 reps - Side Stepping with Counter Support  - 1 x daily - 5 x  weekly - 2 sets - 10 reps - Standing Quarter Turn with Counter Support  - 1-2 x daily - 7 x weekly - 1 sets - 3-5  reps    ----------------------------------------------------------------- Note: Objective measures below were completed at Evaluation unless otherwise noted.  DIAGNOSTIC FINDINGS: NA per this episode  COGNITION: Overall cognitive status:  hx of MCI per MD note; WFL for session today   SENSATION: WFL  COORDINATION: Slowed RAM with toe tapping  EDEMA:  1+pitting edema BLEs MD is aware-has recommended compression stocking  MUSCLE TONE: Mild increase LLE tone  POSTURE: rounded shoulders, forward head, and posterior pelvic tilt  LOWER EXTREMITY ROM:     Active  Right Eval Left Eval  Hip flexion    Hip extension    Hip abduction    Hip adduction    Hip internal rotation    Hip external rotation    Knee flexion    Knee extension    Ankle dorsiflexion 10 8  Ankle plantarflexion    Ankle inversion    Ankle eversion     (Blank rows = not tested)    TRANSFERS: Assistive device utilized: None  Sit to stand: CGA and without use of BUEs, he has posterior lean, not as prominent with use of BUEs Stand to sit: CGA   GAIT: Gait pattern: step through pattern, decreased step length- Right, decreased step length- Left, shuffling, trunk flexed, and narrow BOS Distance walked: 50 ft x 2 Assistive device utilized: None Level of assistance: SBA  FUNCTIONAL TESTS:  5 times sit to stand: 18 sec with 3 of 5 episodes of posterior lean Timed up and go (TUG): 12.41 sec 10 meter walk test: 10 sec  MiniBESTest:  14/28 TUG cognitive:  23.25 esc TUG manual:  12.94 sec 360 turn R:  13 steps, 4.66 sec 360 turn L:  12 steps, 4.44 sec 22M back:  7.34 sec, small, quick, narrow BOS steps        GOALS: Goals reviewed with patient? Yes  SHORT TERM GOALS: Target date: 01/23/2023  Pt will be independent with HEP for improved balance, strength, gait. Baseline: Goal status: MET 01/22/2023  2.  Pt will improve 5x sit<>stand to less than or equal to 15 sec with no posterior lean to  demonstrate improved functional strength and transfer efficiency. Baseline: 18 sec, 3 of 5 reps posterior lean>15.13 sec with UE support at chair, no posterior lean Goal status: MET 01/22/2023  3.  Pt will improve TUG cognitive score to less than or equal to 18 sec for decreased fall risk. Baseline: 23.45 sec>15.53 sec Goal status: MET 01/22/2023   LONG TERM GOALS: Target date: 02/20/2023>03/27/2023  Pt will be independent with progression of HEP for improved balance, strength, gait. Baseline: update given 02/20/2023; needs cues 03/30/2023 Goal status: NOT MET 03/30/2023  2.  Pt will improve 5x sit<>stand to less than or equal to 12.5 sec and no posterior lean to demonstrate improved functional strength and transfer efficiency. Baseline: 22.44 sec with initial posterior lean (hands on knees)>16.43 sec 03/30/2023; 27 sec wit posterior lean no UE support Goal status: NOT  MET 03/30/2023  3.  Pt will improve MiniBESTest score to at least 19/28 to decrease fall risk. Baseline: 14/28>17/28 02/2023>15/28 03/30/2023 Goal status: NOT MET 03/30/2023  4.  Pt will perform 360 turn to R and L in 8 steps or less for improved balance. Baseline: 12-13 steps; 12-14 steps with supervision 02/18/2023>10-11 steps with min guard Goal status: NOT YET MET, IN  PROGRESS 02/18/2023;IN PROGRESS 03/30/2023  5.  Pt will perform posterior push and release test in 2 steps or less, for improved posterior balance recovery. Baseline: 2-3 steps Goal status: IN PROGRESS, 03/30/2023  6.  Pt will verbalize understanding of local Parkinson's disease community resources, including optimal fitness program. Baseline: info provided 03/30/2023 Goal status: MET 03/30/2023  LONG TERM GOALS: UPDATED Target date: 04/24/2023  Pt will be independent with final HEP for improved balance, transfers, and gait. Baseline:  Goal status: IN PROGRESS  2.  Pt will perform 8 of 10 reps of sit to stand with no retropulsion, using BUEs as needed,  for improved safety with transfers. Baseline: without UE support, pt has retropulsion 5 of 5 trials Goal status: IN PROGRESS  3.  Pt will perform 360 turn to R and L in 8 steps or less for improved balance. Baseline: 10-11 steps Goal status: IN PROGRESS  4.  Pt will verbalize understanding of continued community fitness to maximize gains made in PT. Baseline:  Goal status: IN PROGRESS  5.  Pt will ambulate at least 500 ft, indoor/outdoor surfaces with appropriate assistive device (cane vs walker), mod I, for improved safety with community gait. Baseline:  Goal status: IN PROGRESS    ASSESSMENT:  CLINICAL IMPRESSION: Initiated with NU-step for rapid alternating movement with good attention to transitions for speed intervals and able to sustain rapid pace without difficulty.  Reinforced activities for transfer training to reduce retropulsion for improved postural stability and safety with sit to stand from lower seat heights with improved self-monitoring via external resistance during movement and use of rollator to provide reference for trunk flexion over BOS.  Gait training to improve safety and speed of ambulation and negotiation of obstacles/turns with no freezing of gait experienced using rollator vs cane, dual tasking did cause a slower pace but no loss of stability.  Push-release trials with greatest difficulty in backwards direction with delayed response and reliance on UE to rails to stop fall, performed trial of large amplitude leg swings for forceful flexion-extension and then able to stop backwards fall in 3-4 small steps! Progressing with POC details and would benefit from ongoing sessions for further restorative, adapative, and compensatory strategies.    OBJECTIVE IMPAIRMENTS: Abnormal gait, decreased balance, decreased knowledge of use of DME, decreased mobility, difficulty walking, decreased ROM, decreased strength, impaired flexibility, and postural dysfunction.   ACTIVITY  LIMITATIONS: standing, squatting, transfers, and locomotion level  PARTICIPATION LIMITATIONS: community activity and yard work  PERSONAL FACTORS: 3+ comorbidities: see above  are also affecting patient's functional outcome.   REHAB POTENTIAL: Good  CLINICAL DECISION MAKING: Evolving/moderate complexity  EVALUATION COMPLEXITY: Moderate  PLAN:  PT FREQUENCY: 2x/week  PT DURATION: 8 weeks plus eval  PLANNED INTERVENTIONS: 97110-Therapeutic exercises, 97530- Therapeutic activity, 97112- Neuromuscular re-education, 97535- Self Care, 16109- Manual therapy, [phone]- Gait training, Patient/Family education, Balance training, and DME instructions  PLAN FOR NEXT SESSION: Sit to stand with chairs without arms to simulate household chairs; gait training with cane (or consider rollator/U-step) Update HEP as needed for targeted sit to stand/squats/balance strategies.      12:44 PM, 04/13/23 M. Kelly Jaslynne Dahan, PT, DPT Physical Therapist- [place] Office Number: [phone]

## 2023-04-14 NOTE — Therapy (Signed)
OUTPATIENT PHYSICAL THERAPY NEURO TREATMENT NOTE   Patient Name: Cody Dickerson MRN: [id number] DOB:1950/12/26, 73 y.o., male Today's Date: 04/15/2023   PCP: Daisy Floro, MD REFERRING PROVIDER: Tat, Octaviano Batty, DO          END OF SESSION:  PT End of Session - 04/15/23 1346     Visit Number 24    Number of Visits 25    Date for PT Re-Evaluation 04/24/23    Authorization Type Medicare/AARP    Progress Note Due on Visit 29   (completed visit 19)   PT Start Time 1310    PT Stop Time 1355    PT Time Calculation (min) 45 min    Equipment Utilized During Treatment Gait belt    Activity Tolerance Patient tolerated treatment well    Behavior During Therapy WFL for tasks assessed/performed                              Past Medical History:  Diagnosis Date   Balance problem    Gout    Hypercholesterolemia    Hypertension    Parkinson's disease (HCC)    Small bowel obstruction (HCC)    Past Surgical History:  Procedure Laterality Date   BOWEL BLOCKAGE  05/2019   IR ANGIO INTRA EXTRACRAN SEL COM CAROTID INNOMINATE BILAT MOD SED  10/03/2021   IR ANGIO VERTEBRAL SEL VERTEBRAL UNI R MOD SED  10/03/2021   IR RADIOLOGIST EVAL & MGMT  08/19/2021   IR US GUIDE VASC ACCESS RIGHT  10/04/2021   Patient Active Problem List   Diagnosis Date Noted   Pain due to onychomycosis of toenails of both feet 09/03/2022   Porokeratosis 06/04/2022   Parkinson's disease (HCC) 02/06/2021   SBO (small bowel obstruction) (HCC) 07/06/2019    ONSET DATE: 12/15/2022 (MD referral)  REFERRING DIAG:  R29.3 (ICD-10-CM) - Abnormal posture  R27.8 (ICD-10-CM) - Other lack of coordination  R25.1 (ICD-10-CM) - Tremor  M62.81 (ICD-10-CM) - Muscle weakness (generalized)  R29.898 (ICD-10-CM) - Other symptoms and signs involving the musculoskeletal system  R26.81 (ICD-10-CM) - Unsteadiness on feet  R26.89 (ICD-10-CM) - Other abnormalities of gait and mobility    THERAPY DIAG:   Unsteadiness on feet  Muscle weakness (generalized)  Abnormal posture  Other abnormalities of gait and mobility  Rationale for Evaluation and Treatment: Rehabilitation  SUBJECTIVE:  SUBJECTIVE STATEMENT: Haven't gotten a walker yet, didn't know that I already had the prescription.   Pt accompanied by: self  PERTINENT HISTORY: Left distal supraclinoid ICA aneurysm, MCI  PAIN:  Are you having pain? Yes: NPRS scale: 0/10 Pain location: low back Pain description: aching Aggravating factors: shoveling snow, standing  Relieving factors: sitting  PRECAUTIONS: Fall  RED FLAGS: None   WEIGHT BEARING RESTRICTIONS: No  FALLS: Has patient fallen in last 6 months? Yes. Number of falls 3-4 -Able to get up on his own from falls, though slowly per report.    LIVING ENVIRONMENT: Lives with: lives with their spouse; pt has to help her at times Lives in: House/apartment Stairs: Yes: External: 3 steps; on right going up Has following equipment at home: None  PLOF: Independent; Spin class 1x/wk, has tried [employer]; walks several times per week; enjoys yard work  PATIENT GOALS: Pt's goals for therapy to get up better from chairs, have better balance.  OBJECTIVE:    TODAY'S TREATMENT: 04/15/23 Activity Comments  Nustep L5 x 6 min UEs/LEs  Good speed; maintaining ~90 SPM  stomp on bosu + step back Weaned from 2 UE support to no support; cues to look outside to maintain posture tall; cues to slow down, take longer step   Green TB resistance at trunk + fwd/back stepping  In II bars; cues to look outside to maintain posture tall; occasional freezing/difficulty lifting foot which causes retropulsion requiring guarding and reaching strategy; better stability when taking larger step    wall bumps EO/EC Heavy  cueing to increase anterior trunk lean to reduce retropulsion; cues not to rush  standing on incline EO and EC Verbal cueing to shift hips forward; required bars to prevent posterior LOB  standing PWR moves Up 10x  Rock 10x  Twist 10x  Step  10x  In II bars; cues to keep eyes ahead, slower pacing. Some use of UE support     PATIENT EDUCATION: Education details: discussed schedule as patient is scheduled out past his POC- advised of reassessment next session  Person educated: Patient Education method: Explanation Education comprehension: verbalized understanding    Access Code: [email or web address] URL: [email or web address] Date: 02/20/2023 Prepared by: [facility] - Outpatient  Rehab - [facility]  Exercises - Seated Hamstring Stretch  - 1-2 x daily - 7 x weekly - 1 sets - 3 reps - 30 sec hold - Standing Gastroc Stretch at Counter  - 1-2 x daily - 7 x weekly - 1 sets - 3 reps - 30sec hold - Seated Active Hip Flexion  - 1 x daily - 5 x weekly - 2 sets - 10 reps - Sit to Stand with Armchair  - 1 x daily - 7 x weekly - 3 sets - 5 reps - Mini Squat with Counter Support  - 1 x daily - 5 x weekly - 3 sets - 10 reps - Alternating Step Backward with Support  - 1 x daily - 5 x weekly - 2 sets - 10 reps - Side Stepping with Counter Support  - 1 x daily - 5 x weekly - 2 sets - 10 reps - Standing Quarter Turn with Counter Support  - 1-2 x daily - 7 x weekly - 1 sets - 3-5 reps    ----------------------------------------------------------------- Note: Objective measures below were completed at Evaluation unless otherwise noted.  DIAGNOSTIC FINDINGS: NA per this episode  COGNITION: Overall cognitive status:  hx of MCI per MD note; [facility] for session today  SENSATION: WFL  COORDINATION: Slowed RAM with toe tapping  EDEMA:  1+pitting edema BLEs MD is aware-has recommended compression stocking  MUSCLE TONE: Mild increase LLE tone  POSTURE: rounded shoulders, forward head,  and posterior pelvic tilt  LOWER EXTREMITY ROM:     Active  Right Eval Left Eval  Hip flexion    Hip extension    Hip abduction    Hip adduction    Hip internal rotation    Hip external rotation    Knee flexion    Knee extension    Ankle dorsiflexion 10 8  Ankle plantarflexion    Ankle inversion    Ankle eversion     (Blank rows = not tested)    TRANSFERS: Assistive device utilized: None  Sit to stand: CGA and without use of BUEs, he has posterior lean, not as prominent with use of BUEs Stand to sit: CGA   GAIT: Gait pattern: step through pattern, decreased step length- Right, decreased step length- Left, shuffling, trunk flexed, and narrow BOS Distance walked: 50 ft x 2 Assistive device utilized: None Level of assistance: SBA  FUNCTIONAL TESTS:  5 times sit to stand: 18 sec with 3 of 5 episodes of posterior lean Timed up and go (TUG): 12.41 sec 10 meter walk test: 10 sec  MiniBESTest:  14/28 TUG cognitive:  23.25 esc TUG manual:  12.94 sec 360 turn R:  13 steps, 4.66 sec 360 turn L:  12 steps, 4.44 sec 77M back:  7.34 sec, small, quick, narrow BOS steps        GOALS: Goals reviewed with patient? Yes  SHORT TERM GOALS: Target date: 01/23/2023  Pt will be independent with HEP for improved balance, strength, gait. Baseline: Goal status: MET 01/22/2023  2.  Pt will improve 5x sit<>stand to less than or equal to 15 sec with no posterior lean to demonstrate improved functional strength and transfer efficiency. Baseline: 18 sec, 3 of 5 reps posterior lean>15.13 sec with UE support at chair, no posterior lean Goal status: MET 01/22/2023  3.  Pt will improve TUG cognitive score to less than or equal to 18 sec for decreased fall risk. Baseline: 23.45 sec>15.53 sec Goal status: MET 01/22/2023   LONG TERM GOALS: Target date: 02/20/2023>03/27/2023  Pt will be independent with progression of HEP for improved balance, strength, gait. Baseline: update given  02/20/2023; needs cues 03/30/2023 Goal status: NOT MET 03/30/2023  2.  Pt will improve 5x sit<>stand to less than or equal to 12.5 sec and no posterior lean to demonstrate improved functional strength and transfer efficiency. Baseline: 22.44 sec with initial posterior lean (hands on knees)>16.43 sec 03/30/2023; 27 sec wit posterior lean no UE support Goal status: NOT  MET 03/30/2023  3.  Pt will improve MiniBESTest score to at least 19/28 to decrease fall risk. Baseline: 14/28>17/28 02/2023>15/28 03/30/2023 Goal status: NOT MET 03/30/2023  4.  Pt will perform 360 turn to R and L in 8 steps or less for improved balance. Baseline: 12-13 steps; 12-14 steps with supervision 02/18/2023>10-11 steps with min guard Goal status: NOT YET MET, IN PROGRESS 02/18/2023;IN PROGRESS 03/30/2023  5.  Pt will perform posterior push and release test in 2 steps or less, for improved posterior balance recovery. Baseline: 2-3 steps Goal status: IN PROGRESS, 03/30/2023  6.  Pt will verbalize understanding of local Parkinson's disease community resources, including optimal fitness program. Baseline: info provided 03/30/2023 Goal status: MET 03/30/2023  LONG TERM GOALS: UPDATED Target date: 04/24/2023  Pt will  be independent with final HEP for improved balance, transfers, and gait. Baseline:  Goal status: IN PROGRESS  2.  Pt will perform 8 of 10 reps of sit to stand with no retropulsion, using BUEs as needed, for improved safety with transfers. Baseline: without UE support, pt has retropulsion 5 of 5 trials Goal status: IN PROGRESS  3.  Pt will perform 360 turn to R and L in 8 steps or less for improved balance. Baseline: 10-11 steps Goal status: IN PROGRESS  4.  Pt will verbalize understanding of continued community fitness to maximize gains made in PT. Baseline:  Goal status: IN PROGRESS  5.  Pt will ambulate at least 500 ft, indoor/outdoor surfaces with appropriate assistive device (cane vs walker), mod I,  for improved safety with community gait. Baseline:  Goal status: IN PROGRESS    ASSESSMENT:  CLINICAL IMPRESSION: Patient arrived to session without complaints. Worked on eliciting stepping and hip strategy with several of today's balance activities. Patient required cues to maintain visual fixation on a target to maintain posture, encourage larger steps, and increase forward trunk lean with transitions. Some use of UE support required with standing PWR moves while also providing cues for posture and pacing. Patient tolerated session well and without complaints upon leaving.   OBJECTIVE IMPAIRMENTS: Abnormal gait, decreased balance, decreased knowledge of use of DME, decreased mobility, difficulty walking, decreased ROM, decreased strength, impaired flexibility, and postural dysfunction.   ACTIVITY LIMITATIONS: standing, squatting, transfers, and locomotion level  PARTICIPATION LIMITATIONS: community activity and yard work  PERSONAL FACTORS: 3+ comorbidities: see above  are also affecting patient's functional outcome.   REHAB POTENTIAL: Good  CLINICAL DECISION MAKING: Evolving/moderate complexity  EVALUATION COMPLEXITY: Moderate  PLAN:  PT FREQUENCY: 2x/week  PT DURATION: 8 weeks plus eval  PLANNED INTERVENTIONS: 97110-Therapeutic exercises, 97530- Therapeutic activity, [id number]- Neuromuscular re-education, 97535- Self Care, 16109- Manual therapy, [phone]- Gait training, Patient/Family education, Balance training, and DME instructions  PLAN FOR NEXT SESSION: reassessment; Sit to stand with chairs without arms to simulate household chairs; gait training with cane (or consider rollator/U-step) Update HEP as needed for targeted sit to stand/squats/balance strategies.      Baldemar Friday, PT, DPT 04/15/23 1:58 PM  [facility] at [facility] [address] [place], [address] [place], [place] [address] Phone # [phone] Fax # [phone]

## 2023-04-15 ENCOUNTER — Ambulatory Visit: Payer: Medicare Other | Admitting: Occupational Therapy

## 2023-04-15 ENCOUNTER — Encounter: Payer: Self-pay | Admitting: Physical Therapy

## 2023-04-15 ENCOUNTER — Ambulatory Visit: Payer: Medicare Other | Admitting: Physical Therapy

## 2023-04-15 DIAGNOSIS — R2681 Unsteadiness on feet: Secondary | ICD-10-CM

## 2023-04-15 DIAGNOSIS — R278 Other lack of coordination: Secondary | ICD-10-CM | POA: Diagnosis not present

## 2023-04-15 DIAGNOSIS — R293 Abnormal posture: Secondary | ICD-10-CM | POA: Diagnosis not present

## 2023-04-15 DIAGNOSIS — R29818 Other symptoms and signs involving the nervous system: Secondary | ICD-10-CM | POA: Diagnosis not present

## 2023-04-15 DIAGNOSIS — R2689 Other abnormalities of gait and mobility: Secondary | ICD-10-CM

## 2023-04-15 DIAGNOSIS — M6281 Muscle weakness (generalized): Secondary | ICD-10-CM

## 2023-04-15 NOTE — Therapy (Signed)
OUTPATIENT OCCUPATIONAL THERAPY PARKINSON'S  Treatment Note   Patient Name: Cody Dickerson MRN: [id number] DOB:05-Mar-1950, 73 y.o., male Today's Date: 04/15/2023  PCP: Daisy Floro, MD  REFERRING PROVIDER: Tat, Octaviano Batty, DO   END OF SESSION:  OT End of Session - 04/15/23 1107     Visit Number 16    Number of Visits 24    Date for OT Re-Evaluation 05/01/23    Authorization Type Medicare A&B    Progress Note Due on Visit 10    OT Start Time 1104    OT Stop Time 1144    OT Time Calculation (min) 40 min    Activity Tolerance Patient tolerated treatment well    Behavior During Therapy WFL for tasks assessed/performed                            Past Medical History:  Diagnosis Date   Balance problem    Gout    Hypercholesterolemia    Hypertension    Parkinson's disease (HCC)    Small bowel obstruction (HCC)    Past Surgical History:  Procedure Laterality Date   BOWEL BLOCKAGE  05/2019   IR ANGIO INTRA EXTRACRAN SEL COM CAROTID INNOMINATE BILAT MOD SED  10/03/2021   IR ANGIO VERTEBRAL SEL VERTEBRAL UNI R MOD SED  10/03/2021   IR RADIOLOGIST EVAL & MGMT  08/19/2021   IR US GUIDE VASC ACCESS RIGHT  10/04/2021   Patient Active Problem List   Diagnosis Date Noted   Pain due to onychomycosis of toenails of both feet 09/03/2022   Porokeratosis 06/04/2022   Parkinson's disease (HCC) 02/06/2021   SBO (small bowel obstruction) (HCC) 07/06/2019    ONSET DATE: 12/15/22 (referral date),  Pt reports Parkinson's dx approx. 3 years ago (approx. 2021)  REFERRING DIAG:   R29.3 (ICD-10-CM) - Abnormal posture  R27.8 (ICD-10-CM) - Other lack of coordination  R25.1 (ICD-10-CM) - Tremor  M62.81 (ICD-10-CM) - Muscle weakness (generalized)  R29.898 (ICD-10-CM) - Other symptoms and signs involving the musculoskeletal system  R26.81 (ICD-10-CM) - Unsteadiness on feet  R26.89 (ICD-10-CM) - Other abnormalities of gait and mobility    THERAPY DIAG:  Other symptoms and  signs involving the nervous system  Unsteadiness on feet  Muscle weakness (generalized)  Other lack of coordination  Abnormal posture  Rationale for Evaluation and Treatment: Rehabilitation  SUBJECTIVE:   SUBJECTIVE STATEMENT: Pt reports difficulty with LB dressing, reporting that his left leg goes on "fine" but that he has difficulty with donning and doffing pant leg on L.   Pt accompanied by: self  PERTINENT HISTORY: 08/12/22 MD Progress Notes and 08/18/22 DO Progress Notes: Parkinson's disease, left distal supraclinoid ICA aneurysm (aneurysm size stable), MCI, HTN, gout, hypercholesterolemia  PRECAUTIONS: Fall, posterior lean when standing/walking   WEIGHT BEARING RESTRICTIONS: No  PAIN:  Are you having pain? Yes 2/10 lower back pain from shoveling snow a few weeks ago  FALLS: Has patient fallen in last 6 months? Yes. Number of falls Pt reports approx. 4 falls. Pt reports picking up sticks in yard when 2 falls occurred.  Per 12/11/22 PT Screen: Pt reports "Balance is bad". Have had several falls in the yard.   LIVING ENVIRONMENT: Lives with: lives with their spouse Lives in: House/apartment Stairs: Yes: External: 3 steps; on left going up Has following equipment at home: Grab bars  PLOF: Independent with basic ADLs, pt currently drives  PATIENT GOALS: "to do learn how to  do things better."  OBJECTIVE:  Note: Objective measures were completed at Evaluation unless otherwise noted.  HAND DOMINANCE: Right  ADLs: Overall ADLs:  Transfers/ambulation related to ADLs: Eating: ind, pt reports slower and more meticulous to get items on fork. No difficulty with cutting and serving food. Grooming: Pt reports shaving with standard razor blade is slower. Pt has electric shaver available though pt has not attempted to use it. UB Dressing: Pt reports "Not as good putting jacket on as I used to be." Pt currently using cape method to don jacket, a strategy learned in previous OT  sessions. Increased difficulty with buttons: "I have more trouble with buttoning buttons placed in front of me than when a shirt is on me." Pt reports taking extra time to work out zippers if zippers get stuck. LB Dressing: "Getting my pant legs on and off are a little slower but it's acceptable." Toileting: Ind Bathing: Ind with someone else in the home for safety Tub Shower transfers: ind Equipment: Grab bars, Sports administrator, and tub/shower  IADLs: Shopping: spouse completes grocery shopping, pt helps out with carrying heavy water Light housekeeping: pt folds laundry without difficulty Meal Prep: Ind with cutting, pt completes charcoal grilling tasks and pt reports being careful and walking slow around grill for safety. 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 Handwriting: Pt reports handwriting is slower than it used to be, and even slower if he focuses on writing legibly.   Pt wrote "Whales live in [place] ocean" in 18 seconds. Increased time required and approx. 66% legible.  MOBILITY STATUS: Ind without A/E. PT reported to OT that pt demo's significant posterior lean when standing and walking. Pt reports "imbalance tends to go backwards."  POSTURE COMMENTS:  rounded shoulders and forward head  ACTIVITY TOLERANCE: Activity tolerance: Pt reports increased fatigue and sleepiness during the day. Pt reports sleeping well at night. Pt reports increased fatigue may be d/t medication though pt is unsure.  FUNCTIONAL OUTCOME MEASURES: 03/19/23 Fastening/unfastening 3 buttons: 2:23 (however last button required 55 seconds to unfasten). OT noted mild tremors of BUE when completing task.  Physical performance test: PPT#2 (simulated eating) 15.22 seconds. OT noted mild tremors of RUE when completing task.  PPT#4 (donning/doffing jacket): 22.31  sec   Fastening/unfastening 3 buttons: 98 seconds. OT noted mild tremors of BUE when completing task.  Physical performance test: PPT#2 (simulated eating) 17 seconds. OT noted mild tremors of RUE when completing task.  PPT#4 (donning/doffing jacket): 18 seconds. With gait belt. OT noted mild tremors of BUE during task.  COORDINATION: 03/19/23 9 Hole Peg test: Right: 46.56 sec; Left: 49.85 sec Box and Blocks:  Right 37 blocks, Left 40 blocks  01/22/23: 9 Hole Peg test: Right: 43.5 sec; Left: 41.25 sec Box and Blocks:  Right 38 blocks, Left 38 blocks  EVAL: 9 Hole Peg test: Right: 36 sec; Left: 36 sec Box and Blocks:  Right 34 blocks, Left 41 blocks  UE ROM:    BUE shoulders flex/ABD - WFL.  BUE elbow flex/ext - WFL with v/c to fully ext. R elbow ext slower than L elbow ext.  Wrist flex/ext - WFL, v/c to fully ext.  Digits flex/ext - WFL, v/c to fully ext thumbs with slightly decreased AROM of B thumbs noted.  Note: Pt demo'd decreased speed to attain full AROM of BUE.   UE MMT:   Not tested  SENSATION:  WFL Pt reports no change.  COGNITION: Overall cognitive status: Within functional limits for tasks assessed. Pt reports more difficulty remembering day of the week, taking medication at 4 PM specifically. Pt reports using calendar to help with memory.   OBSERVATIONS: Pt was pleasant and agreeable. Pt recognized difficulty with ADL/IADL tasks and described current level of functioning well. OT noted Bradykinesia and mild tremors with FM tasks . Pt donned reading glasses for handwriting task. Pt with good recall of techniques and strategies from previous OT session, motivated to participate in [facility] tasks.   TODAY'S TREATMENT:                                                                      DATE:  04/15/23 Bag exercises: engaged in large amplitude chest opening followed by simulated LB dressing with focus on large amplitude lifting of RLE to advance bag under foot.  OT  modified tasks to facilitate increased carryover, with pt demonstrating good large amplitude movements when reaching forward to simulate pulling pants up/down over leg.  OT providing demonstration and cues for amplitude, particularly when moving bag from back to front as needed for doffing pants.  OT providing demonstration with sample pants and educating on upright sitting posture prior to reaching down to doff pants.  OT also educating on use of "rocking" foot from heel up to toes up to advance pant leg off foot vs use of reacher to aid in doffing pants.  OT reiterating recommendation for intentional movements even when somewhat small in nature.    04/13/23 Handwriting: engaged in massed practice with handwriting with focus on letter size, cursive vs print, and use of upper case when writing in print to increase legibility.  Pt utilizing personal pen with good sizing throughout on lined paper and 90% legibility overall.  Pt recognizing tendency to write smaller as task continued, therefore reiterated using space available on lined paper.  OT also educating on use of return labels to decrease need for writing name and address on bills, especially as space for return address is quite small.   Self-care:  Buttoning: reviewed button hook and problem solving various styles of shirts and fabrics with pt reporting decreased difficulty with golf style shirt.  Will continue to benefit from practice with various styles/fabrics. Don/doff Jacket: engaged in massed practice with donning and doffing jacket.  Pt demonstrating good amplitude when donning jacket this session.  OT providing cues for hand placement and amplitude when doffing jacket.  Pt completed 5x with improved ease and amplitude by end of task.   PATIENT EDUCATION: Education details:  see today's tx above, PD specific education for large amplitude/coordination Person educated: Patient Education method: Explanation and Handouts Education  comprehension: verbalized understanding  HOME EXERCISE PROGRAM: 04/15/23 - bag exercises - updated (see pt instructions)  01/20/23 - coordination exercises (see pt instructions)  01/08/23 - large amplitude hands (see pt instructions)  GOALS: Goals reviewed with patient? Yes  GOALS:  SHORT TERM GOALS: Target date: 01/23/23    Pt will be independent with PD specific HEP. Baseline: not yet initiated Goal status: MET - completing large amplitude, bag exercises, and coordination HEP on 01/22/23  2.  Pt will verbalize understanding of adapted strategies to maximize safety  and independence with ADLs/IADLs. Baseline: not yet initiated Goal status: MET - 01/22/23 3.  Pt will write a simple sentence with at least 75% legibility and complete the sentence in 15 seconds or less.  Baseline: 66% legibility, 18 seconds for simple sentence Goal status: PARTIALLY MET - required increased time (26 sec) however improved legibility to 75% on 01/22/23  4.  Pt will demonstrate improved fine motor coordination for ADLs as evidenced by completing 9 hole peg test score for BUE in 30 seconds or less. Baseline: 9 Hole Peg test: Right: 36 sec; Left: 36 sec Goal status: NOT MET - Right: 43.5 sec; Left: 41.25 sec on 01/22/23   5.  Pt will be able to place at least 45 blocks with BUE hand with completion of Box and Blocks test. Baseline: Right 34 blocks, Left 41 blocks Goal status: NOT MET - Completed R: 38 and L: 38 on 01/22/23    NEW LONG TERM GOALS: Target date: 04/30/22    Pt will verbalize understanding of ways to prevent future PD related complications and PD community resources. Baseline: not yet initiated Goal status: IN PROGRESS  2.  Pt will write a simulated/sample check with at least 85% legibility while maintaining steady speed. Baseline: 66% legibility for simple sentence, 18 seconds for simple sentence   03/19/23 - 75% legibility, 30 seconds for simple sentence Goal status: IN  PROGRESS  3.  Pt will demonstrate improved ease with fastening buttons as evidenced by decreasing 3 button/unbutton time to 80 seconds or less. Baseline: 98 seconds 03/19/23 - 2:23 (last button required 55 seconds to unfasten) Goal status: IN PROGRESS  4.  Pt will verbalize understanding of ways to keep thinking skills sharp and ways to compensate for STM changes in the future. Baseline: not yet initiated Goal status: IN PROGRESS  5.  Pt will demonstrate increased ease with dressing as evidenced by decreasing PPT#4 (don/ doff jacket) to 15 secs or less. Baseline: PPT #4: 18 secs 03/19/23 - 22:31 sec Goal status: IN PROGRESS    ASSESSMENT:  CLINICAL IMPRESSION: Pt tolerated tasks well and continues to demonstrate improved carryover with cues for increased amplitude and "intentional" movements during bag exercises this session to simulate LB dressing.  Pt demonstrating improved ease and efficiency with massed practice.  Pt will benefit from continued skilled occupational therapy services to address coordination, ROM, balance, GM/FM control, safety awareness, introduction of compensatory strategies/AE prn, and implementation of an HEP to improve participation and safety during ADLs, IADLs, and quality of life.   PERFORMANCE DEFICITS: in functional skills including ADLs, IADLs, coordination, dexterity, proprioception, ROM, strength, Fine motor control, Gross motor control, mobility, balance, body mechanics, endurance, and UE functional use, cognitive skills including energy/drive and memory, and psychosocial skills including environmental adaptation.   IMPAIRMENTS: are limiting patient from ADLs, IADLs, leisure, and social participation.     PLAN:  OT FREQUENCY: 2x/week  OT DURATION: 6 weeks (dates extended to allow for scheduling)  PLANNED INTERVENTIONS: 97168 OT Re-evaluation, 97535 self care/ADL training, 19147 therapeutic exercise, 97530 therapeutic activity, 97112 neuromuscular  re-education, 97140 manual therapy, 97035 ultrasound, 97018 paraffin, 82956 fluidotherapy, 97010 moist heat, 97010 cryotherapy, 97032 electrical stimulation (manual), 97014 electrical stimulation unattended, 97760 Orthotics management and training, 21308 Splinting (initial encounter), [id number] Subsequent splinting/medication, passive range of motion, functional mobility training, energy conservation, patient/family education, and DME and/or AE instructions  RECOMMENDED OTHER SERVICES: PT eval already completed on 12/24/22.  CONSULTED AND AGREED WITH PLAN OF CARE: Patient  PLAN FOR NEXT  SESSION:   Large amplitude movements  Review LB dressing recommendations - particularly with undressing  Bag exercises for UB and LB dressing  Continue FM tasks - buttons, other FMC tasks, handwriting    Evanee Lubrano, OT 04/15/2023, 11:08 AM

## 2023-04-15 NOTE — Patient Instructions (Signed)
Bag Exercises:  Small trash bag or produce bag works best.  For all exercises, sit with big posture (sit up tall with head up) and use big movements. Perform the following exercises 1-2 times per day.  Hold bag in one hand. Stretch both arms/hands out to the side as big as you can. Then, pass bag from one hand to the other IN FRONT of you. Stretch arms back out big after each pass. Repeat 10 times. Hold bag in one hand. Stretch both arms/hands out to the side as big as you can. Then, pass bag from one hand to the other BEHIND you. Stretch arms back out big after each pass. Repeat 10 times. Hold bag in right hand. Move right hand to reach behind shoulder. Then, reach behind back with left hand to pass bag from right hand to left hand. Switch sides. Repeat 10 times on each side. Hold bag in both hands in front of you with hands/arms shoulder length apart. Move bag behind your head. Repeat 10 times.  Hold bag in both hands in front of you with hands/arms shoulder length apart. Lift leg and move bag completely under each foot and back. Repeat 10 times on each side. Hold bag in both hands, resting bag on thigh.  With large movement push bag towards floor/foot as if pushing pants off. Hold end of bag in one hand. Stretch fingers out big to draw the entire bag into your palm. Repeat 5 times with each hand.

## 2023-04-20 ENCOUNTER — Other Ambulatory Visit: Payer: Self-pay

## 2023-04-20 ENCOUNTER — Ambulatory Visit: Payer: Medicare Other

## 2023-04-20 DIAGNOSIS — R270 Ataxia, unspecified: Secondary | ICD-10-CM

## 2023-04-22 ENCOUNTER — Ambulatory Visit: Payer: Medicare Other | Admitting: Occupational Therapy

## 2023-04-22 ENCOUNTER — Ambulatory Visit: Payer: Medicare Other

## 2023-04-27 ENCOUNTER — Encounter: Payer: Self-pay | Admitting: Physical Therapy

## 2023-04-27 ENCOUNTER — Ambulatory Visit: Payer: Medicare Other | Admitting: Physical Therapy

## 2023-04-27 DIAGNOSIS — R2689 Other abnormalities of gait and mobility: Secondary | ICD-10-CM | POA: Diagnosis not present

## 2023-04-27 DIAGNOSIS — M6281 Muscle weakness (generalized): Secondary | ICD-10-CM

## 2023-04-27 DIAGNOSIS — R293 Abnormal posture: Secondary | ICD-10-CM

## 2023-04-27 DIAGNOSIS — R29818 Other symptoms and signs involving the nervous system: Secondary | ICD-10-CM | POA: Diagnosis not present

## 2023-04-27 DIAGNOSIS — R278 Other lack of coordination: Secondary | ICD-10-CM | POA: Diagnosis not present

## 2023-04-27 DIAGNOSIS — R2681 Unsteadiness on feet: Secondary | ICD-10-CM

## 2023-04-27 MED ORDER — DIAZEPAM 5 MG PO TABS: ORAL_TABLET | ORAL | 0 refills | Status: DC

## 2023-04-27 NOTE — Addendum Note (Signed)
 Addended by: Kerin Salen S on: 04/27/2023 04:00 PM   Modules accepted: Orders

## 2023-04-27 NOTE — Therapy (Signed)
 OUTPATIENT PHYSICAL THERAPY NEURO TREATMENT NOTE/RECERT   Patient Name: Cody Dickerson MRN: [id number] DOB:[birth year], 73 y.o., male Today's Date: 04/28/2023   PCP: Daisy Floro, MD REFERRING PROVIDER: Tat, Octaviano Batty, DO          END OF SESSION:  PT End of Session - 04/27/23 1105     Visit Number 25    Number of Visits 26    Date for PT Re-Evaluation 05/01/23    Authorization Type Medicare/AARP    Progress Note Due on Visit 29   (completed visit 19)   PT Start Time 1105    PT Stop Time 1147    PT Time Calculation (min) 42 min    Equipment Utilized During Treatment Gait belt    Activity Tolerance Patient tolerated treatment well    Behavior During Therapy WFL for tasks assessed/performed                               Past Medical History:  Diagnosis Date   Balance problem    Gout    Hypercholesterolemia    Hypertension    Parkinson's disease (HCC)    Small bowel obstruction (HCC)    Past Surgical History:  Procedure Laterality Date   BOWEL BLOCKAGE  05/2019   IR ANGIO INTRA EXTRACRAN SEL COM CAROTID INNOMINATE BILAT MOD SED  10/03/2021   IR ANGIO VERTEBRAL SEL VERTEBRAL UNI R MOD SED  10/03/2021   IR RADIOLOGIST EVAL & MGMT  08/19/2021   IR US GUIDE VASC ACCESS RIGHT  10/04/2021   Patient Active Problem List   Diagnosis Date Noted   Pain due to onychomycosis of toenails of both feet 09/03/2022   Porokeratosis 06/04/2022   Parkinson's disease (HCC) 02/06/2021   SBO (small bowel obstruction) (HCC) 07/06/2019    ONSET DATE: 12/15/2022 (MD referral)  REFERRING DIAG:  R29.3 (ICD-10-CM) - Abnormal posture  R27.8 (ICD-10-CM) - Other lack of coordination  R25.1 (ICD-10-CM) - Tremor  M62.81 (ICD-10-CM) - Muscle weakness (generalized)  R29.898 (ICD-10-CM) - Other symptoms and signs involving the musculoskeletal system  R26.81 (ICD-10-CM) - Unsteadiness on feet  R26.89 (ICD-10-CM) - Other abnormalities of gait and mobility    THERAPY  DIAG:  Unsteadiness on feet  Muscle weakness (generalized)  Abnormal posture  Other abnormalities of gait and mobility  Other symptoms and signs involving the nervous system  Rationale for Evaluation and Treatment: Rehabilitation  SUBJECTIVE:  SUBJECTIVE STATEMENT: Balance is not so good.  Have had some falls.  Brushed snow off of my car and fell into the neighbor's yard.  Could not get up.  Had to have EMS get me up and didn't get hurt.  Other falls were with moving too quickly.  Do have a new walker now and I use that in the house.  I tend to move very quickly without the walker.  Am scheduled for and MRI 05/18/23.    Pt accompanied by: self  PERTINENT HISTORY: Left distal supraclinoid ICA aneurysm, MCI  PAIN:  Are you having pain? Yes: NPRS scale: 0/10 Pain location: low back Pain description: aching Aggravating factors: shoveling snow, standing  Relieving factors: sitting  PRECAUTIONS: Fall  RED FLAGS: None   WEIGHT BEARING RESTRICTIONS: No  FALLS: Has patient fallen in last 6 months? Yes. Number of falls 3-4 -Able to get up on his own from falls, though slowly per report.    LIVING ENVIRONMENT: Lives with: lives with their spouse; pt has to help her at times Lives in: House/apartment Stairs: Yes: External: 3 steps; on right going up Has following equipment at home: None  PLOF: Independent; Spin class 1x/wk, has tried [employer]; walks several times per week; enjoys yard work  PATIENT GOALS: Pt's goals for therapy to get up better from chairs, have better balance.  OBJECTIVE:    TODAY'S TREATMENT: 04/27/2023 Activity Comments  Sit to stand 10 of 10 reps with UE support no retropulsion   Sit to stand 10 of 10 reps with UE support no retropulsion, with cognitive task   Turning 360  R and L 23 steps R, 21 steps L  Marching in place, marching turn; 2 reps 360    Marching in place then 180 marching turn UE support      Access Code: [email or web address] URL: [email or web address] Date: 04/27/2023 Prepared by: [facility] - Outpatient  Rehab - [facility]  Exercises - Seated Hamstring Stretch  - 1-2 x daily - 7 x weekly - 1 sets - 3 reps - 30 sec hold - Standing Gastroc Stretch at Counter  - 1-2 x daily - 7 x weekly - 1 sets - 3 reps - 30sec hold - Seated Active Hip Flexion  - 1 x daily - 5 x weekly - 2 sets - 10 reps - Sit to Stand with Armchair  - 1 x daily - 7 x weekly - 3 sets - 5 reps - Mini Squat with Counter Support  - 1 x daily - 5 x weekly - 3 sets - 10 reps - Alternating Step Backward with Support  - 1 x daily - 5 x weekly - 2 sets - 10 reps - Side Stepping with Counter Support  - 1 x daily - 5 x weekly - 2 sets - 10 reps - Alternating Step forward-Balance Reaction  - 1 x daily - 7 x weekly - 2 sets - 10 reps - Standing March with Counter Support  - 1 x daily - 7 x weekly - 1-2 sets - 10 reps      PATIENT EDUCATION: Education details: Recert, update to HEP; POC-to include this week's visits and then go on hold until after MRI  Person educated: Patient Education method: Explanation Education comprehension: verbalized understanding      ----------------------------------------------------------------- Note: Objective measures below were completed at Evaluation unless otherwise noted.  DIAGNOSTIC FINDINGS: NA per this episode  COGNITION: Overall cognitive status:  hx of MCI per MD note; [facility]  for session today   SENSATION: WFL  COORDINATION: Slowed RAM with toe tapping  EDEMA:  1+pitting edema BLEs MD is aware-has recommended compression stocking  MUSCLE TONE: Mild increase LLE tone  POSTURE: rounded shoulders, forward head, and posterior pelvic tilt  LOWER EXTREMITY ROM:     Active  Right Eval Left Eval  Hip flexion    Hip  extension    Hip abduction    Hip adduction    Hip internal rotation    Hip external rotation    Knee flexion    Knee extension    Ankle dorsiflexion 10 8  Ankle plantarflexion    Ankle inversion    Ankle eversion     (Blank rows = not tested)    TRANSFERS: Assistive device utilized: None  Sit to stand: CGA and without use of BUEs, he has posterior lean, not as prominent with use of BUEs Stand to sit: CGA   GAIT: Gait pattern: step through pattern, decreased step length- Right, decreased step length- Left, shuffling, trunk flexed, and narrow BOS Distance walked: 50 ft x 2 Assistive device utilized: None Level of assistance: SBA  FUNCTIONAL TESTS:  5 times sit to stand: 18 sec with 3 of 5 episodes of posterior lean Timed up and go (TUG): 12.41 sec 10 meter walk test: 10 sec  MiniBESTest:  14/28 TUG cognitive:  23.25 esc TUG manual:  12.94 sec 360 turn R:  13 steps, 4.66 sec 360 turn L:  12 steps, 4.44 sec 59M back:  7.34 sec, small, quick, narrow BOS steps        GOALS: Goals reviewed with patient? Yes  SHORT TERM GOALS: Target date: 01/23/2023  Pt will be independent with HEP for improved balance, strength, gait. Baseline: Goal status: MET 01/22/2023  2.  Pt will improve 5x sit<>stand to less than or equal to 15 sec with no posterior lean to demonstrate improved functional strength and transfer efficiency. Baseline: 18 sec, 3 of 5 reps posterior lean>15.13 sec with UE support at chair, no posterior lean Goal status: MET 01/22/2023  3.  Pt will improve TUG cognitive score to less than or equal to 18 sec for decreased fall risk. Baseline: 23.45 sec>15.53 sec Goal status: MET 01/22/2023   LONG TERM GOALS: UPDATED Target date: 04/24/2023>04/29/2023  Pt will be independent with final HEP for improved balance, transfers, and gait. Baseline: HEP consolidated/updates provided 04/27/2023 Goal status: IN PROGRESS 04/27/2023  2.  Pt will perform 8 of 10 reps of sit  to stand with no retropulsion, using BUEs as needed, for improved safety with transfers. Baseline: with UE support, 10 of 10 trials no retropulsion Goal status: MET 04/27/2023  3.  Pt will perform 360 turn to R and L in 8 steps or less for improved balance. Baseline: 10-11 steps; 04/27/2023:  23 steps to R, 21 steps to L Goal status: NOT MET  4.  Pt will verbalize understanding of continued community fitness to maximize gains made in PT. Baseline:  Goal status: IN PROGRESS2/24/2025  5.  Pt will ambulate at least 500 ft, indoor/outdoor surfaces with appropriate assistive device (cane vs walker), mod I, for improved safety with community gait. Baseline:  Goal status: IN PROGRESS2/24/2025    ASSESSMENT:  CLINICAL IMPRESSION: Pt presents today after cancellations last week due to therapist out unexpectedly and then inclement weather.  Pt does report he continues to have falls; he had several falls in the past 2 weeks, one was outside on the ground, trying to  scrape snow off of his car.  He does note that he is scheduled for MRI in mid-March.  Completed recert today to focus on several outstanding items in POC, given pt not being seen last week:  updated HEP to reflect marching for turns and forward/back weightshift, need to practice long distance outdoor gait with rollator (he has gotten one for home).  He has LTG 2 for improved sit to stand, and he does note improvement with that at home.  LTG 3 not met for turns.  LTG 1, 4, and 5 are in progress and will be addressed by the next visit.  At that time, will discuss either going on hold or discharging until after MRI has been completed, given pt's continued falls despite therapy intervention.    OBJECTIVE IMPAIRMENTS: Abnormal gait, decreased balance, decreased knowledge of use of DME, decreased mobility, difficulty walking, decreased ROM, decreased strength, impaired flexibility, and postural dysfunction.   ACTIVITY LIMITATIONS: standing,  squatting, transfers, and locomotion level  PARTICIPATION LIMITATIONS: community activity and yard work  PERSONAL FACTORS: 3+ comorbidities: see above  are also affecting patient's functional outcome.   REHAB POTENTIAL: Good  CLINICAL DECISION MAKING: Evolving/moderate complexity  EVALUATION COMPLEXITY: Moderate  PLAN:  PT FREQUENCY: 2x/week  PT DURATION: 1 week per recert 04/27/2023  PLANNED INTERVENTIONS: 97110-Therapeutic exercises, 97530- Therapeutic activity, 97112- Neuromuscular re-education, 97535- Self Care, 16109- Manual therapy, [phone]- Gait training, Patient/Family education, Balance training, and DME instructions  PLAN FOR NEXT SESSION: Review HEP additions this visit, check remaining LTGs, including outdoor gait with rollator.  Discuss going on hold versus discharge after next visit, awaiting MRI results.    Lonia Blood, PT 04/28/23 8:14 AM Phone: [phone] Fax: [phone]  [facility] at [facility] [address], [address] [place], [place] [address] Phone # [phone] Fax # [phone]

## 2023-04-29 ENCOUNTER — Ambulatory Visit: Payer: Medicare Other | Admitting: Physical Therapy

## 2023-04-29 ENCOUNTER — Ambulatory Visit: Payer: Medicare Other | Admitting: Occupational Therapy

## 2023-04-29 DIAGNOSIS — R2689 Other abnormalities of gait and mobility: Secondary | ICD-10-CM | POA: Diagnosis not present

## 2023-04-29 DIAGNOSIS — R2681 Unsteadiness on feet: Secondary | ICD-10-CM | POA: Diagnosis not present

## 2023-04-29 DIAGNOSIS — R29818 Other symptoms and signs involving the nervous system: Secondary | ICD-10-CM

## 2023-04-29 DIAGNOSIS — R293 Abnormal posture: Secondary | ICD-10-CM

## 2023-04-29 DIAGNOSIS — M6281 Muscle weakness (generalized): Secondary | ICD-10-CM

## 2023-04-29 DIAGNOSIS — R278 Other lack of coordination: Secondary | ICD-10-CM

## 2023-04-29 NOTE — Therapy (Signed)
 OUTPATIENT PHYSICAL THERAPY NEURO TREATMENT NOTE   Patient Name: Cody Dickerson MRN: [id number] DOB:[birth year], 73 y.o., male Today's Date: 04/30/2023   PCP: Daisy Floro, MD REFERRING PROVIDER: Tat, Octaviano Batty, DO          END OF SESSION:  PT End of Session - 04/29/23 1156     Visit Number 26    Number of Visits 26    Date for PT Re-Evaluation 05/01/23    Authorization Type Medicare/AARP    Progress Note Due on Visit 29   (completed visit 19)   PT Start Time 1153    PT Stop Time 1231    PT Time Calculation (min) 38 min    Equipment Utilized During Treatment Gait belt    Activity Tolerance Patient tolerated treatment well    Behavior During Therapy WFL for tasks assessed/performed                                Past Medical History:  Diagnosis Date   Balance problem    Gout    Hypercholesterolemia    Hypertension    Parkinson's disease (HCC)    Small bowel obstruction (HCC)    Past Surgical History:  Procedure Laterality Date   BOWEL BLOCKAGE  05/2019   IR ANGIO INTRA EXTRACRAN SEL COM CAROTID INNOMINATE BILAT MOD SED  10/03/2021   IR ANGIO VERTEBRAL SEL VERTEBRAL UNI R MOD SED  10/03/2021   IR RADIOLOGIST EVAL & MGMT  08/19/2021   IR US GUIDE VASC ACCESS RIGHT  10/04/2021   Patient Active Problem List   Diagnosis Date Noted   Pain due to onychomycosis of toenails of both feet 09/03/2022   Porokeratosis 06/04/2022   Parkinson's disease (HCC) 02/06/2021   SBO (small bowel obstruction) (HCC) 07/06/2019    ONSET DATE: 12/15/2022 (MD referral)  REFERRING DIAG:  R29.3 (ICD-10-CM) - Abnormal posture  R27.8 (ICD-10-CM) - Other lack of coordination  R25.1 (ICD-10-CM) - Tremor  M62.81 (ICD-10-CM) - Muscle weakness (generalized)  R29.898 (ICD-10-CM) - Other symptoms and signs involving the musculoskeletal system  R26.81 (ICD-10-CM) - Unsteadiness on feet  R26.89 (ICD-10-CM) - Other abnormalities of gait and mobility    THERAPY DIAG:   Unsteadiness on feet  Muscle weakness (generalized)  Abnormal posture  Other abnormalities of gait and mobility  Rationale for Evaluation and Treatment: Rehabilitation  SUBJECTIVE:  SUBJECTIVE STATEMENT: Having trouble getting out of chairs with no arms in OT session today.  No falls since last visit.  Still scheduled for and MRI 05/18/23.    Pt accompanied by: self  PERTINENT HISTORY: Left distal supraclinoid ICA aneurysm, MCI  PAIN:  Are you having pain? Yes: NPRS scale: 0/10 Pain location: low back Pain description: aching Aggravating factors: shoveling snow, standing  Relieving factors: sitting  PRECAUTIONS: Fall  RED FLAGS: None   WEIGHT BEARING RESTRICTIONS: No  FALLS: Has patient fallen in last 6 months? Yes. Number of falls 3-4 -Able to get up on his own from falls, though slowly per report.    LIVING ENVIRONMENT: Lives with: lives with their spouse; pt has to help her at times Lives in: House/apartment Stairs: Yes: External: 3 steps; on right going up Has following equipment at home: None  PLOF: Independent; Spin class 1x/wk, has tried [employer]; walks several times per week; enjoys yard work  PATIENT GOALS: Pt's goals for therapy to get up better from chairs, have better balance.  OBJECTIVE:    TODAY'S TREATMENT: 04/29/2023 Activity Comments  Review of full HEP-see below Pt has not yet done new ones at home; PT provides cues for technique for optimal support for standing exercises  Indoor/outdoor gait with rollator, >750 ft Supervision/CGA, including cues for inclines and downward slopes  Discussed continued community fitness See below  Sit to stand from armrests of chair 8 reps throughout session  No LOB, good positioning            Access Code: [email or web address] URL:  [email or web address] Date: 04/27/2023 Prepared by: [facility] - Outpatient  Rehab - [facility]  Exercises - Seated Hamstring Stretch  - 1-2 x daily - 7 x weekly - 1 sets - 3 reps - 30 sec hold - Standing Gastroc Stretch at Counter  - 1-2 x daily - 7 x weekly - 1 sets - 3 reps - 30sec hold - Seated Active Hip Flexion  - 1 x daily - 5 x weekly - 2 sets - 10 reps - Sit to Stand with Armchair  - 1 x daily - 7 x weekly - 3 sets - 5 reps - Mini Squat with Counter Support  - 1 x daily - 5 x weekly - 3 sets - 10 reps - Alternating Step Backward with Support  - 1 x daily - 5 x weekly - 2 sets - 10 reps - Side Stepping with Counter Support  - 1 x daily - 5 x weekly - 2 sets - 10 reps - Alternating Step forward-Balance Reaction  - 1 x daily - 7 x weekly - 2 sets - 10 reps - Standing March with Counter Support  - 1 x daily - 7 x weekly - 1-2 sets - 10 reps      PATIENT EDUCATION: Education details: Review of HEP; use of bike at [facility], 2 days/wk in addition to cycling class and continued HEP (at least 3x/wk); discussed going on hold for PT until after MRI (pt will likely need new order from Dr. Arbutus Leas at that time).  Safety with transfers, optimal safety with gait would be with rollator Person educated: Patient Education method: Explanation Education comprehension: verbalized understanding      ----------------------------------------------------------------- Note: Objective measures below were completed at Evaluation unless otherwise noted.  DIAGNOSTIC FINDINGS: NA per this episode  COGNITION: Overall cognitive status:  hx of MCI per MD note; WFL for session today   SENSATION: WFL  COORDINATION: Slowed  RAM with toe tapping  EDEMA:  1+pitting edema BLEs MD is aware-has recommended compression stocking  MUSCLE TONE: Mild increase LLE tone  POSTURE: rounded shoulders, forward head, and posterior pelvic tilt  LOWER EXTREMITY ROM:     Active  Right Eval Left Eval   Hip flexion    Hip extension    Hip abduction    Hip adduction    Hip internal rotation    Hip external rotation    Knee flexion    Knee extension    Ankle dorsiflexion 10 8  Ankle plantarflexion    Ankle inversion    Ankle eversion     (Blank rows = not tested)    TRANSFERS: Assistive device utilized: None  Sit to stand: CGA and without use of BUEs, he has posterior lean, not as prominent with use of BUEs Stand to sit: CGA   GAIT: Gait pattern: step through pattern, decreased step length- Right, decreased step length- Left, shuffling, trunk flexed, and narrow BOS Distance walked: 50 ft x 2 Assistive device utilized: None Level of assistance: SBA  FUNCTIONAL TESTS:  5 times sit to stand: 18 sec with 3 of 5 episodes of posterior lean Timed up and go (TUG): 12.41 sec 10 meter walk test: 10 sec  MiniBESTest:  14/28 TUG cognitive:  23.25 esc TUG manual:  12.94 sec 360 turn R:  13 steps, 4.66 sec 360 turn L:  12 steps, 4.44 sec 16M back:  7.34 sec, small, quick, narrow BOS steps        GOALS: Goals reviewed with patient? Yes  SHORT TERM GOALS: Target date: 01/23/2023  Pt will be independent with HEP for improved balance, strength, gait. Baseline: Goal status: MET 01/22/2023  2.  Pt will improve 5x sit<>stand to less than or equal to 15 sec with no posterior lean to demonstrate improved functional strength and transfer efficiency. Baseline: 18 sec, 3 of 5 reps posterior lean>15.13 sec with UE support at chair, no posterior lean Goal status: MET 01/22/2023  3.  Pt will improve TUG cognitive score to less than or equal to 18 sec for decreased fall risk. Baseline: 23.45 sec>15.53 sec Goal status: MET 01/22/2023   LONG TERM GOALS: UPDATED Target date: 04/24/2023>04/29/2023  Pt will be independent with final HEP for improved balance, transfers, and gait. Baseline: HEP consolidated/updates provided 04/27/2023 (has not done yet 04/29/2023) Goal status: IN PROGRESS  04/27/2023  2.  Pt will perform 8 of 10 reps of sit to stand with no retropulsion, using BUEs as needed, for improved safety with transfers. Baseline: with UE support, 10 of 10 trials no retropulsion Goal status: MET 04/27/2023  3.  Pt will perform 360 turn to R and L in 8 steps or less for improved balance. Baseline: 10-11 steps; 04/27/2023:  23 steps to R, 21 steps to L Goal status: NOT MET  4.  Pt will verbalize understanding of continued community fitness to maximize gains made in PT. Baseline:  Goal status: MET2/26/2025  5.  Pt will ambulate at least 500 ft, indoor/outdoor surfaces with appropriate assistive device (cane vs walker), mod I, for improved safety with community gait. Baseline: supervision 04/29/2023 Goal status: IN PROGRESS   ASSESSMENT:  CLINICAL IMPRESSION: Pt presents today with no new c/o, though OT reports having to focus a good deal of time on safety with transfers from chair without armrests today.  PT sees this at end of OT session prior to PT session, that pt has multiple episodes of retropulsion, pushing  chair backwards and would fall if unaided.  Discussed safety with transfers (review from previous PT sessions), that at home, he likely needs to sit in chair with arms to improved transfer safety; also reiterated to SLOW DOWN with transfers. Skilled PT session focused on review of HEP and education in trying to get more aerobic/cycling activity at [facility] as we will be holding until PT until we hear from pt/MD following MRI. He continues to demo decreased safety awareness and increased fall risk; rollator is a very safe option for patient, but he admits he will have difficulty getting it out of the house to use in the community.  OBJECTIVE IMPAIRMENTS: Abnormal gait, decreased balance, decreased knowledge of use of DME, decreased mobility, difficulty walking, decreased ROM, decreased strength, impaired flexibility, and postural dysfunction.   ACTIVITY LIMITATIONS:  standing, squatting, transfers, and locomotion level  PARTICIPATION LIMITATIONS: community activity and yard work  PERSONAL FACTORS: 3+ comorbidities: see above  are also affecting patient's functional outcome.   REHAB POTENTIAL: Good  CLINICAL DECISION MAKING: Evolving/moderate complexity  EVALUATION COMPLEXITY: Moderate  PLAN:  PT FREQUENCY: 2x/week  PT DURATION: 1 week per recert 04/27/2023  PLANNED INTERVENTIONS: 97110-Therapeutic exercises, 97530- Therapeutic activity, 97112- Neuromuscular re-education, 97535- Self Care, 56433- Manual therapy, [phone]- Gait training, Patient/Family education, Balance training, and DME instructions  PLAN FOR NEXT SESSION: HOLD PT, awaiting MRI results. (Will likely need new MD order)    Lonia Blood, PT 04/30/23 12:58 PM Phone: [phone] Fax: [phone]  [facility] at [facility] [address], [address] [place], [place] [address] Phone # [phone] Fax # [phone]

## 2023-04-29 NOTE — Patient Instructions (Addendum)
 Keeping Thinking Skills Sharp: 1. Jigsaw puzzles 2. Card/board games 3. Talking on the phone/social events 4. Lumosity.com 5. Online games 6. Word searches/crossword puzzles 7.  Logic puzzles 8. Aerobic exercise (stationary bike) 9. Eating balanced diet (fruits & veggies) 10. Drink water 11. Try something new--new recipe, hobby 12. Crafts 13. Do a variety of activities that are challenging 14.  Plan weekly meals and write a grocery list 15. --Only do this  If safe (no freezing/falls) - Safety first! Think BIG STEPS, avoid multi-tasking.  Sitting and Standing:   When standing up: Scoot to edge of seat Wide base of support for feet Nose over toes Use armrests if available to help push self up to standing  When sitting down: Come from straight in front of chair, not sideways Feel chair behind both legs Use armrests if available to help lower self.

## 2023-04-29 NOTE — Therapy (Signed)
 OUTPATIENT OCCUPATIONAL THERAPY PARKINSON'S  Treatment Note   Patient Name: Cody Dickerson MRN: [id number] DOB:May 28, 1950, 73 y.o., male Today's Date: 04/29/2023  PCP: Daisy Floro, MD  REFERRING PROVIDER: Arbutus Leas Octaviano Batty, DO   END OF SESSION:  OT End of Session - 04/29/23 1158     Visit Number 17    Number of Visits 24    Date for OT Re-Evaluation 05/01/23    Authorization Type Medicare A&B    Progress Note Due on Visit 10    OT Start Time 1106    OT Stop Time 1148    OT Time Calculation (min) 42 min    Activity Tolerance Patient tolerated treatment well    Behavior During Therapy [facility] for tasks assessed/performed;Impulsive                             Past Medical History:  Diagnosis Date   Balance problem    Gout    Hypercholesterolemia    Hypertension    Parkinson's disease (HCC)    Small bowel obstruction (HCC)    Past Surgical History:  Procedure Laterality Date   BOWEL BLOCKAGE  05/2019   IR ANGIO INTRA EXTRACRAN SEL COM CAROTID INNOMINATE BILAT MOD SED  10/03/2021   IR ANGIO VERTEBRAL SEL VERTEBRAL UNI R MOD SED  10/03/2021   IR RADIOLOGIST EVAL & MGMT  08/19/2021   IR US GUIDE VASC ACCESS RIGHT  10/04/2021   Patient Active Problem List   Diagnosis Date Noted   Pain due to onychomycosis of toenails of both feet 09/03/2022   Porokeratosis 06/04/2022   Parkinson's disease (HCC) 02/06/2021   SBO (small bowel obstruction) (HCC) 07/06/2019    ONSET DATE: 12/15/22 (referral date),  Pt reports Parkinson's dx approx. 3 years ago (approx. 2021)  REFERRING DIAG:   R29.3 (ICD-10-CM) - Abnormal posture  R27.8 (ICD-10-CM) - Other lack of coordination  R25.1 (ICD-10-CM) - Tremor  M62.81 (ICD-10-CM) - Muscle weakness (generalized)  R29.898 (ICD-10-CM) - Other symptoms and signs involving the musculoskeletal system  R26.81 (ICD-10-CM) - Unsteadiness on feet  R26.89 (ICD-10-CM) - Other abnormalities of gait and mobility    THERAPY DIAG:  Other  symptoms and signs involving the nervous system  Unsteadiness on feet  Muscle weakness (generalized)  Other lack of coordination  Abnormal posture  Rationale for Evaluation and Treatment: Rehabilitation  SUBJECTIVE:   SUBJECTIVE STATEMENT: Pt demo'd near fall when attempting to sit after ambulating into clinic today, and OT assisted pt with sitting down to prevent fall. Pt reported difficulty with sit-to-stand and feeling more unsteady today.   Pt reported using adaptive strategies discussed at previous OT session to write checks, which has improved ability to write checks.  Pt accompanied by: self  PERTINENT HISTORY: 08/12/22 MD Progress Notes and 08/18/22 DO Progress Notes: Parkinson's disease, left distal supraclinoid ICA aneurysm (aneurysm size stable), MCI, HTN, gout, hypercholesterolemia  PRECAUTIONS: Fall, posterior lean when standing/walking   WEIGHT BEARING RESTRICTIONS: No  PAIN:  Are you having pain? Yes 2/10 lower back pain from shoveling snow a few weeks ago  FALLS: Has patient fallen in last 6 months? Yes. Number of falls Pt reports approx. 4 falls. Pt reports picking up sticks in yard when 2 falls occurred.  Per 12/11/22 PT Screen: Pt reports "Balance is bad". Have had several falls in the yard.   04/29/23 - Pt reported brushing snow off car and took a step, then fell. Pt denied  injury though required assistance to get up from EMS. Pt's spouse called 9-1-1 though pt did not go to hospital.  LIVING ENVIRONMENT: Lives with: lives with their spouse Lives in: House/apartment Stairs: Yes: External: 3 steps; on left going up Has following equipment at home: Grab bars  PLOF: Independent with basic ADLs, pt currently drives  PATIENT GOALS: "to do learn how to do things better."  OBJECTIVE:  Note: Objective measures were completed at Evaluation unless otherwise noted.  HAND DOMINANCE: Right  ADLs: Overall ADLs:  Transfers/ambulation related to ADLs: Eating:  ind, pt reports slower and more meticulous to get items on fork. No difficulty with cutting and serving food. Grooming: Pt reports shaving with standard razor blade is slower. Pt has electric shaver available though pt has not attempted to use it. UB Dressing: Pt reports "Not as good putting jacket on as I used to be." Pt currently using cape method to don jacket, a strategy learned in previous OT sessions. Increased difficulty with buttons: "I have more trouble with buttoning buttons placed in front of me than when a shirt is on me." Pt reports taking extra time to work out zippers if zippers get stuck. LB Dressing: "Getting my pant legs on and off are a little slower but it's acceptable." Toileting: Ind Bathing: Ind with someone else in the home for safety Tub Shower transfers: ind Equipment: Grab bars, Sports administrator, and tub/shower  IADLs: Shopping: spouse completes grocery shopping, pt helps out with carrying heavy water Light housekeeping: pt folds laundry without difficulty Meal Prep: Ind with cutting, pt completes charcoal grilling tasks and pt reports being careful and walking slow around grill for safety. 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 Handwriting: Pt reports handwriting is slower than it used to be, and even slower if he focuses on writing legibly.   Pt wrote "Whales live in the ocean" in 18 seconds. Increased time required and approx. 66% legible.  MOBILITY STATUS: Ind without A/E. PT reported to OT that pt demo's significant posterior lean when standing and walking. Pt reports "imbalance tends to go backwards."  POSTURE COMMENTS:  rounded shoulders and forward head  ACTIVITY TOLERANCE: Activity tolerance: Pt reports increased fatigue and sleepiness during the day. Pt reports sleeping well at night. Pt reports increased fatigue may be  d/t medication though pt is unsure.  FUNCTIONAL OUTCOME MEASURES: 03/19/23 Fastening/unfastening 3 buttons: 2:23 (however last button required 55 seconds to unfasten). OT noted mild tremors of BUE when completing task.  Physical performance test: PPT#2 (simulated eating) 15.22 seconds. OT noted mild tremors of RUE when completing task.  PPT#4 (donning/doffing jacket): 22.31 sec   Fastening/unfastening 3 buttons: 98 seconds. OT noted mild tremors of BUE when completing task.  Physical performance test: PPT#2 (simulated eating) 17 seconds. OT noted mild tremors of RUE when completing task.  PPT#4 (donning/doffing jacket): 18 seconds. With gait belt. OT noted mild tremors of BUE during task.  COORDINATION: 03/19/23 9 Hole Peg test: Right: 46.56 sec; Left: 49.85 sec Box and Blocks:  Right 37 blocks, Left 40 blocks  01/22/23: 9 Hole Peg test: Right: 43.5 sec; Left: 41.25 sec Box and Blocks:  Right 38 blocks, Left 38 blocks  EVAL: 9 Hole Peg test: Right: 36 sec; Left: 36 sec Box and Blocks:  Right 34 blocks, Left 41 blocks  UE ROM:    BUE shoulders flex/ABD - WFL.  BUE elbow  flex/ext - WFL with v/c to fully ext. R elbow ext slower than L elbow ext.  Wrist flex/ext - WFL, v/c to fully ext.  Digits flex/ext - WFL, v/c to fully ext thumbs with slightly decreased AROM of B thumbs noted.  Note: Pt demo'd decreased speed to attain full AROM of BUE.   UE MMT:   Not tested  SENSATION: WFL Pt reports no change.  COGNITION: Overall cognitive status: Within functional limits for tasks assessed. Pt reports more difficulty remembering day of the week, taking medication at 4 PM specifically. Pt reports using calendar to help with memory.   OBSERVATIONS: Pt was pleasant and agreeable. Pt recognized difficulty with ADL/IADL tasks and described current level of functioning well. OT noted Bradykinesia and mild tremors with FM tasks . Pt donned reading glasses for handwriting task. Pt with  good recall of techniques and strategies from previous OT session, motivated to participate in [facility] tasks.   TODAY'S TREATMENT:                                                                      DATE:   Self-Care HEP update: OT educated pt on Activities to keep thinking skills sharp - to improve cognition, to prevent cognitive decline. Handout provided, see pt instructions. Pt verbalized understanding.  TherAct Logic puzzle, seated - to improve sequencing for unfamiliar and novel tasks, to improve deductive reasoning, to improve cognition, to practice handwriting with "big" letters. Pt benefited from mod v/c to assist with solving puzzle and min v/c for "big" writing strokes.  Neuro Re-Ed Sit-to-stand/stand-to-sit practice with gait belt - to reduce fall risk, to improve safety, to improve sequencing of safe motor movements. Handwritten instructions provided to pt on sequence to reduce fall risk during sitting/standing transitions, see pt instructions. Pt verbalized understanding though required v/c and assistance to improve carry over.  OT provided extensive education to pt on reducing fall risk and strategies to improve safety. Pt acknowledged understanding though benefited from v/c throughout session for big steps when ambulating and sequence of sit-to-stand/stand-to-sit transitions. Pt required min to modA to complete sit-to-stand transitions with tendency for posterior lean and demo'd some impulsivity when attempting to move without first positioning for safe transition.  PATIENT EDUCATION: Education details:  see today's tx above, PD specific education for large amplitude/coordination Person educated: Patient Education method: Explanation and Handouts Education comprehension: verbalized understanding  HOME EXERCISE PROGRAM: 04/15/23 - bag exercises - updated (see pt instructions)  01/20/23 - coordination exercises (see pt instructions)  01/08/23 - large amplitude hands (see pt  instructions)  04/29/23 - HEP handout: activities to keep thinking skills sharp  GOALS: Goals reviewed with patient? Yes  GOALS:  SHORT TERM GOALS: Target date: 01/23/23    Pt will be independent with PD specific HEP. Baseline: not yet initiated Goal status: MET - completing large amplitude, bag exercises, and coordination HEP on 01/22/23  2.  Pt will verbalize understanding of adapted strategies to maximize safety and independence with ADLs/IADLs. Baseline: not yet initiated Goal status: MET - 01/22/23 3.  Pt will write a simple sentence with at least 75% legibility and complete the sentence in 15 seconds or less.  Baseline: 66% legibility, 18 seconds for simple sentence Goal status: PARTIALLY  MET - required increased time (26 sec) however improved legibility to 75% on 01/22/23  4.  Pt will demonstrate improved fine motor coordination for ADLs as evidenced by completing 9 hole peg test score for BUE in 30 seconds or less. Baseline: 9 Hole Peg test: Right: 36 sec; Left: 36 sec Goal status: NOT MET - Right: 43.5 sec; Left: 41.25 sec on 01/22/23   5.  Pt will be able to place at least 45 blocks with BUE hand with completion of Box and Blocks test. Baseline: Right 34 blocks, Left 41 blocks Goal status: NOT MET - Completed R: 38 and L: 38 on 01/22/23    NEW LONG TERM GOALS: Target date: 04/30/22    Pt will verbalize understanding of ways to prevent future PD related complications and PD community resources. Baseline: not yet initiated Goal status: IN PROGRESS  2.  Pt will write a simulated/sample check with at least 85% legibility while maintaining steady speed. Baseline: 66% legibility for simple sentence, 18 seconds for simple sentence   03/19/23 - 75% legibility, 30 seconds for simple sentence Goal status: IN PROGRESS  3.  Pt will demonstrate improved ease with fastening buttons as evidenced by decreasing 3 button/unbutton time to 80 seconds or less. Baseline: 98  seconds 03/19/23 - 2:23 (last button required 55 seconds to unfasten) Goal status: IN PROGRESS  4.  Pt will verbalize understanding of ways to keep thinking skills sharp and ways to compensate for STM changes in the future. Baseline: not yet initiated 04/29/23 - activities to keep thinking skills sharp HEP Goal status: IN PROGRESS  5.  Pt will demonstrate increased ease with dressing as evidenced by decreasing PPT#4 (don/ doff jacket) to 15 secs or less. Baseline: PPT #4: 18 secs 03/19/23 - 22:31 sec Goal status: IN PROGRESS    ASSESSMENT:  CLINICAL IMPRESSION: Pt tolerated tasks though demo'd increased unsteadiness on feet today when completing sitting-to-standing transitions. Pt acknowledged increased difficulty getting up from chairs lately and reported feeling more unsteady today. OT provided extensive education regarding safety and reducing fall risk today with pt continuing to benefit from v/c for safety throughout session. Pt tolerated logic puzzle task well though benefited from v/c to assist with cognitive components of task. Pt identified relevant activities to keep thinking skills sharp at home (per pt instructions handout) to improve HEP carryover.   OT discussed with PT and pt about going on hold from therapy today until after pt's scheduled upcoming MRI, currently scheduled for mid-March, d/t potential influence of unknown underlying factors leading to recent decline which may be further clarified by MRI. OT recommended to pt to continue to complete OT HEP, and pt verbalized understanding. Pt agreeable to going on hold from therapy. OT to monitor and will f/u after pt's upcoming MRI.  PERFORMANCE DEFICITS: in functional skills including ADLs, IADLs, coordination, dexterity, proprioception, ROM, strength, Fine motor control, Gross motor control, mobility, balance, body mechanics, endurance, and UE functional use, cognitive skills including energy/drive and memory, and psychosocial  skills including environmental adaptation.   IMPAIRMENTS: are limiting patient from ADLs, IADLs, leisure, and social participation.     PLAN:  OT FREQUENCY: 2x/week  OT DURATION: 6 weeks (dates extended to allow for scheduling)  PLANNED INTERVENTIONS: 97168 OT Re-evaluation, 97535 self care/ADL training, 16109 therapeutic exercise, 97530 therapeutic activity, 97112 neuromuscular re-education, 97140 manual therapy, 97035 ultrasound, 97018 paraffin, 60454 fluidotherapy, 97010 moist heat, 97010 cryotherapy, 97032 electrical stimulation (manual), 97014 electrical stimulation unattended, 97760 Orthotics management and training, 09811  Splinting (initial encounter), [id number] Subsequent splinting/medication, passive range of motion, functional mobility training, energy conservation, patient/family education, and DME and/or AE instructions  RECOMMENDED OTHER SERVICES: PT eval already completed on 12/24/22.  CONSULTED AND AGREED WITH PLAN OF CARE: Patient  PLAN FOR NEXT SESSION:   Hold therapy - will monitor, pt has MRI currently scheduled for 05/18/23    Wynetta Emery, OT 04/29/2023, 12:22 PM

## 2023-05-14 DIAGNOSIS — I10 Essential (primary) hypertension: Secondary | ICD-10-CM | POA: Diagnosis not present

## 2023-05-18 ENCOUNTER — Ambulatory Visit
Admission: RE | Admit: 2023-05-18 | Discharge: 2023-05-18 | Disposition: A | Discharge status: Home / Self Care | Payer: Medicare Other | Source: Ambulatory Visit | Attending: Neurology | Admitting: Neurology

## 2023-05-18 DIAGNOSIS — R270 Ataxia, unspecified: Secondary | ICD-10-CM | POA: Diagnosis not present

## 2023-05-21 ENCOUNTER — Encounter: Payer: Self-pay | Admitting: Neurology

## 2023-05-27 ENCOUNTER — Telehealth: Payer: Self-pay | Admitting: Neurology

## 2023-05-27 ENCOUNTER — Emergency Department (HOSPITAL_COMMUNITY)
Admission: EM | Admit: 2023-05-27 | Discharge: 2023-05-28 | Disposition: A | Discharge status: Home / Self Care | Attending: Emergency Medicine | Admitting: Emergency Medicine

## 2023-05-27 ENCOUNTER — Emergency Department (HOSPITAL_COMMUNITY)

## 2023-05-27 ENCOUNTER — Other Ambulatory Visit: Payer: Self-pay

## 2023-05-27 DIAGNOSIS — G20C Parkinsonism, unspecified: Secondary | ICD-10-CM | POA: Diagnosis not present

## 2023-05-27 DIAGNOSIS — Z79899 Other long term (current) drug therapy: Secondary | ICD-10-CM | POA: Insufficient documentation

## 2023-05-27 DIAGNOSIS — Z736 Limitation of activities due to disability: Secondary | ICD-10-CM | POA: Diagnosis not present

## 2023-05-27 DIAGNOSIS — S0990XA Unspecified injury of head, initial encounter: Secondary | ICD-10-CM | POA: Diagnosis not present

## 2023-05-27 DIAGNOSIS — R531 Weakness: Secondary | ICD-10-CM | POA: Diagnosis not present

## 2023-05-27 DIAGNOSIS — R600 Localized edema: Secondary | ICD-10-CM | POA: Diagnosis not present

## 2023-05-27 DIAGNOSIS — R296 Repeated falls: Secondary | ICD-10-CM | POA: Insufficient documentation

## 2023-05-27 DIAGNOSIS — I6782 Cerebral ischemia: Secondary | ICD-10-CM | POA: Diagnosis not present

## 2023-05-27 DIAGNOSIS — G20A1 Parkinson's disease without dyskinesia, without mention of fluctuations: Secondary | ICD-10-CM

## 2023-05-27 DIAGNOSIS — Z789 Other specified health status: Secondary | ICD-10-CM

## 2023-05-27 DIAGNOSIS — R000 Tachycardia, unspecified: Secondary | ICD-10-CM | POA: Diagnosis not present

## 2023-05-27 DIAGNOSIS — I10 Essential (primary) hypertension: Secondary | ICD-10-CM | POA: Insufficient documentation

## 2023-05-27 LAB — CBC
HCT: 42.1 % (ref 39.0–52.0)
Hemoglobin: 13.3 g/dL (ref 13.0–17.0)
MCH: 30.9 pg (ref 26.0–34.0)
MCHC: 31.6 g/dL (ref 30.0–36.0)
MCV: 97.7 fL (ref 80.0–100.0)
Platelets: 114 10*3/uL — ABNORMAL LOW (ref 150–400)
RBC: 4.31 MIL/uL (ref 4.22–5.81)
RDW: 12.5 % (ref 11.5–15.5)
WBC: 5.3 10*3/uL (ref 4.0–10.5)
nRBC: 0 % (ref 0.0–0.2)

## 2023-05-27 LAB — BASIC METABOLIC PANEL
Anion gap: 9 (ref 5–15)
BUN: 24 mg/dL — ABNORMAL HIGH (ref 8–23)
CO2: 25 mmol/L (ref 22–32)
Calcium: 9.4 mg/dL (ref 8.9–10.3)
Chloride: 106 mmol/L (ref 98–111)
Creatinine, Ser: 0.93 mg/dL (ref 0.61–1.24)
GFR, Estimated: >60 mL/min (ref >60)
Glucose, Bld: 87 mg/dL (ref 70–99)
Potassium: 3.8 mmol/L (ref 3.5–5.1)
Sodium: 140 mmol/L (ref 135–145)

## 2023-05-27 LAB — URINALYSIS, MICROSCOPIC (REFLEX)

## 2023-05-27 LAB — URINALYSIS, ROUTINE W REFLEX MICROSCOPIC
Bilirubin Urine: NEGATIVE
Glucose, UA: NEGATIVE mg/dL
Ketones, ur: NEGATIVE mg/dL
Leukocytes,Ua: NEGATIVE
Nitrite: NEGATIVE
Specific Gravity, Urine: 1.025 (ref 1.005–1.030)
pH: 5.5 (ref 5.0–8.0)

## 2023-05-27 LAB — TSH: TSH: 1.298 u[IU]/mL (ref 0.350–4.500)

## 2023-05-27 MED ORDER — EZETIMIBE 10 MG PO TABS
10.0000 mg | ORAL_TABLET | Freq: Every day | ORAL | Status: DC
  Administered 2023-05-27 – 2023-05-28 (×2): 10 mg via ORAL
  Filled 2023-05-27 (×3): qty 1

## 2023-05-27 MED ORDER — ROSUVASTATIN CALCIUM 20 MG PO TABS
20.0000 mg | ORAL_TABLET | Freq: Every day | ORAL | Status: DC
Start: 2023-05-27 — End: 2023-05-28
  Administered 2023-05-27 – 2023-05-28 (×2): 20 mg via ORAL
  Filled 2023-05-27 (×2): qty 1

## 2023-05-27 MED ORDER — PRAMIPEXOLE DIHYDROCHLORIDE 0.25 MG PO TABS
0.5000 mg | ORAL_TABLET | Freq: Three times a day (TID) | ORAL | Status: DC
  Administered 2023-05-27 – 2023-05-28 (×3): 0.5 mg via ORAL
  Filled 2023-05-27 (×5): qty 2

## 2023-05-27 MED ORDER — CARBIDOPA-LEVODOPA 25-100 MG PO TABS
2.0000 | ORAL_TABLET | Freq: Three times a day (TID) | ORAL | Status: DC
  Administered 2023-05-27 – 2023-05-28 (×3): 2 via ORAL
  Filled 2023-05-27 (×5): qty 2

## 2023-05-27 MED ORDER — IBUPROFEN 200 MG PO TABS
400.0000 mg | ORAL_TABLET | Freq: Once | ORAL | Status: AC
  Administered 2023-05-27: 400 mg via ORAL
  Filled 2023-05-27: qty 2

## 2023-05-27 NOTE — ED Provider Notes (Signed)
 [place] EMERGENCY DEPARTMENT AT [facility] Provider Note   CSN: [id number] Arrival date & time: 05/27/23  7829     History  Chief Complaint  Patient presents with   Weakness    AH BOTT is a 73 y.o. male.  HPI Patient has history of Parkinson's disease.  Symptoms have been worsening since about Christmas per the patient's wife.  Before Christmas, he could carry some boxes and had adequate strength and balance to function in the house.  Over the past several months, patient's imbalance and coordination have been declining.  He has had 3 falls this week.  Last fall was day before yesterday.  Patient denies any injuries.  He reports he would just be standing there and then fall backwards.  He reports his neurologist has recommended canes and walkers.  He is trying to function with that but he reports today he was just so incoordinated he felt that he could not get out of bed.  He denies he has had fevers or chills.  No productive cough.  No vomiting or diarrhea.  No appetite change.  No dysuria urgency.  His wife notes that the patient's urine has been dark and is possible he has gotten an illness or something that changed sometime after Christmas.  Patient's wife reports she is going to start exploring possibilities of increased in-home care or placement.  Patient reports they have communicated with their neurologist Dr. Arbutus Leas.  The patient indicates that dr. Don Perking opinion is that this is likely the advancing Parkinson's disease.    Home Medications Prior to Admission medications   Medication Sig Start Date End Date Taking? Authorizing Provider  carbidopa-levodopa (SINEMET IR) 25-100 MG tablet Take 2 tablets by mouth 3 (three) times daily. 7am/11am/4pm 02/17/23   Tat, Octaviano Batty, DO  diazepam (VALIUM) 5 MG tablet 1 tab 1 hour prior to test; may repeat 30 min prior to test prn 04/27/23   Tat, Octaviano Batty, DO  ezetimibe (ZETIA) 10 MG tablet Take 1 tablet (10 mg total) by mouth  daily. 08/12/22 11/10/22  Yates Decamp, MD  indomethacin (INDOCIN) 50 MG capsule Take 50 mg by mouth 3 (three) times daily as needed for moderate pain (gout pain). 12/17/17   [provider]  pramipexole (MIRAPEX) 0.5 MG tablet TAKE 1 TABLET BY MOUTH THREE TIMES DAILY 01/26/23   Tat, Octaviano Batty, DO  rosuvastatin (CRESTOR) 20 MG tablet Take 20 mg by mouth daily.  12/17/17   [provider]      Allergies    Escitalopram and Lisinopril    Review of Systems   Review of Systems  Physical Exam Updated Vital Signs BP (!) 155/78   Pulse (!) 58   Temp (!) 97.4 F (36.3 C) (Oral)   Resp 16   Ht 5\' 9"  (1.753 m)   Wt 72.6 kg   SpO2 100%   BMI 23.63 kg/m  Physical Exam Constitutional:      Comments: Alert nontoxic no acute distress.  No respiratory distress.  HENT:     Head: Normocephalic and atraumatic.     Right Ear: External ear normal.     Left Ear: External ear normal.     Nose: Nose normal.     Mouth/Throat:     Mouth: Mucous membranes are moist.     Pharynx: Oropharynx is clear.  Eyes:     Extraocular Movements: Extraocular movements intact.     Conjunctiva/sclera: Conjunctivae normal.     Pupils: Pupils are  equal, round, and reactive to light.  Cardiovascular:     Rate and Rhythm: Normal rate and regular rhythm.  Pulmonary:     Effort: Pulmonary effort is normal.     Breath sounds: Normal breath sounds.  Chest:     Chest wall: No tenderness.  Abdominal:     General: There is no distension.     Palpations: Abdomen is soft.     Tenderness: There is no abdominal tenderness. There is no guarding.  Musculoskeletal:        General: No swelling or tenderness. Normal range of motion.     Cervical back: Neck supple.     Right lower leg: Edema present.     Left lower leg: Edema present.     Comments: About 2+ edema bilateral lower extremities.  Symmetric.  Skin:    General: Skin is warm and dry.  Neurological:     Comments: Patient is alert and situationally  appropriate.  Speech is clear and cognitively intact.  Patient has symmetric grip strength and symmetric lower extremity strength to extension.  He does have some tremor when put through passive range of motion.  He is slightly delayed in following commands for extension of the lower extremities.  Psychiatric:     Comments: Affect is flat.     ED Results / Procedures / Treatments   Labs (all labs ordered are listed, but only abnormal results are displayed) Labs Reviewed  BASIC METABOLIC PANEL - Abnormal; Notable for the following components:      Result Value   BUN 24 (*)    All other components within normal limits  CBC - Abnormal; Notable for the following components:   Platelets 114 (*)    All other components within normal limits  URINALYSIS, ROUTINE W REFLEX MICROSCOPIC - Abnormal; Notable for the following components:   Hgb urine dipstick SMALL (*)    Protein, ur TRACE (*)    All other components within normal limits  URINALYSIS, MICROSCOPIC (REFLEX) - Abnormal; Notable for the following components:   Bacteria, UA RARE (*)    All other components within normal limits  TSH    EKG EKG Interpretation Date/Time:  Wednesday May 27 2023 09:29:48 EDT Ventricular Rate:  53 PR Interval:  178 QRS Duration:  93 QT Interval:  444 QTC Calculation: 417 R Axis:   59  Text Interpretation: Sinus rhythm no acute ischemic appearance. no old comparison Confirmed by Arby Barrette [phone]) on 05/27/2023 10:14:18 AM  Radiology CT Head Wo Contrast Result Date: 05/27/2023 CLINICAL DATA:  Head trauma, minor (Age >= 65y) EXAM: CT HEAD WITHOUT CONTRAST TECHNIQUE: Contiguous axial images were obtained from the base of the skull through the vertex without intravenous contrast. RADIATION DOSE REDUCTION: This exam was performed according to the departmental dose-optimization program which includes automated exposure control, adjustment of the mA and/or kV according to patient size and/or use of  iterative reconstruction technique. COMPARISON:  06/11/2020, 05/18/2023 FINDINGS: Brain: No evidence of acute infarction, hemorrhage, hydrocephalus, extra-axial collection or mass lesion/mass effect. Scattered low-density changes within the periventricular and subcortical white matter most compatible with chronic microvascular ischemic change. Mild diffuse cerebral volume loss. Vascular: Atherosclerotic calcifications involving the large vessels of the skull base. No unexpected hyperdense vessel. Skull: Normal. Negative for fracture or focal lesion. Sinuses/Orbits: No acute finding. Other: Negative for scalp hematoma. IMPRESSION: 1. No acute intracranial findings. 2. Chronic microvascular ischemic change and cerebral volume loss. Electronically Signed   By: Duanne Guess D.O.  On: 05/27/2023 10:59    Procedures Procedures    Medications Ordered in ED Medications - No data to display  ED Course/ Medical Decision Making/ A&P                                 Medical Decision Making Amount and/or Complexity of Data Reviewed Labs: ordered. Radiology: ordered.   Patient presents as outlined.  He has had a fairly rapid progression in symptoms of Parkinson's disease over the past couple of months.  Unction has dramatically declined.  Patient had 3 falls this week and most recent day before yesterday.  Given acute change, will proceed with CT head to make sure there is no occult injury such as subdural hematoma.  Will obtain basic lab work to rule out electrolyte derangement\recurrent infection to explain decline..  CT head by radiology no acute findings.  Urinalysis negative.  Basic metabolic panel within normal limits.  CBC within normal limits.  At this time there do not appear to be acute medical conditions exacerbating patient's symptoms of Parkinson's.  He has had advancing incoordination and frequency of falls.  Patient reports he is so incoordinated his movements now is hard for him to  even get up.  TOC social work and PT consulted for assistance with SNF placement.  TOC has done consult and patient will qualify for SNF placement.        Final Clinical Impression(s) / ED Diagnoses Final diagnoses:  Frequent falls  Parkinson's disease, unspecified whether dyskinesia present, unspecified whether manifestations fluctuate (HCC)  Activity of daily living alteration    Rx / DC Orders ED Discharge Orders     None         Arby Barrette, MD 05/27/23 1308

## 2023-05-27 NOTE — Telephone Encounter (Signed)
 Pt left a voicemail stating that he needs to speak with someone please call  him back

## 2023-05-27 NOTE — Telephone Encounter (Signed)
 Left message with the after hour service on 05-27-23 at 1:02 pm   Caller wants to let Dr Tat know that he [facility]  [facility]

## 2023-05-27 NOTE — NC FL2 (Signed)
  [place] MEDICAID FL2 LEVEL OF CARE FORM     IDENTIFICATION  Patient Name: TAITE SCHOEPPNER Birthdate: 01/05/1951 Sex: male Admission Date (Current Location): 05/27/2023  [facility] and [place] Number:  Producer, television/film/video and Address:  [facility],  [address] [place] [address]      Provider Number: [id number]  Attending Physician Name and Address:  Arby Barrette, MD  Relative Name and Phone Number:  Rondel Oh (Spouse)  [phone] [facility])    Current Level of Care: Hospital Recommended Level of Care: Skilled Nursing Facility Prior Approval Number:    Date Approved/Denied:   PASRR Number: PENDING  Discharge Plan: SNF    Current Diagnoses: Patient Active Problem List   Diagnosis Date Noted   Pain due to onychomycosis of toenails of both feet 09/03/2022   Porokeratosis 06/04/2022   Parkinson's disease (HCC) 02/06/2021   SBO (small bowel obstruction) (HCC) 07/06/2019    Orientation RESPIRATION BLADDER Height & Weight     Self, Time, Situation, Place  Normal Continent Weight: 160 lb (72.6 kg) Height:  5\' 9"  (175.3 cm)  BEHAVIORAL SYMPTOMS/MOOD NEUROLOGICAL BOWEL NUTRITION STATUS      Continent Diet (Regular)  AMBULATORY STATUS COMMUNICATION OF NEEDS Skin   Limited Assist Verbally Normal                       Personal Care Assistance Level of Assistance  Bathing, Feeding, Dressing Bathing Assistance: Limited assistance Feeding assistance: Independent Dressing Assistance: Limited assistance     Functional Limitations Info  Sight, Hearing, Speech Sight Info: Adequate Hearing Info: Adequate Speech Info: Adequate    SPECIAL CARE FACTORS FREQUENCY  PT (By licensed PT), OT (By licensed OT)     PT Frequency: x5/week OT Frequency: x5/week            Contractures Contractures Info: Not present    Additional Factors Info  Code Status, Allergies Code Status Info: Full Allergies Info: Escitalopram  Lisinopril            Current Medications (05/27/2023):  This is the current hospital active medication list No current facility-administered medications for this encounter.   Current Outpatient Medications  Medication Sig Dispense Refill   carbidopa-levodopa (SINEMET IR) 25-100 MG tablet Take 2 tablets by mouth 3 (three) times daily. 7am/11am/4pm 540 tablet 1   diazepam (VALIUM) 5 MG tablet 1 tab 1 hour prior to test; may repeat 30 min prior to test prn 3 tablet 0   ezetimibe (ZETIA) 10 MG tablet Take 1 tablet (10 mg total) by mouth daily. 90 tablet 3   indomethacin (INDOCIN) 50 MG capsule Take 50 mg by mouth 3 (three) times daily as needed for moderate pain (gout pain).     pramipexole (MIRAPEX) 0.5 MG tablet TAKE 1 TABLET BY MOUTH THREE TIMES DAILY 270 tablet 0   rosuvastatin (CRESTOR) 20 MG tablet Take 20 mg by mouth daily.   4     Discharge Medications: Please see discharge summary for a list of discharge medications.  Relevant Imaging Results:  Relevant Lab Results:   Additional Information SSN: [id number]  Princella Ion, LCSW

## 2023-05-27 NOTE — Progress Notes (Signed)
 CSW spoke with wife, Meriam Sprague, who is interested in short term rehab. CSW explained insurance (Medicare A/B with [facility] Waiver) and provided list of Eligible SNFs in [place]. Meriam Sprague states her preferred is [facility]. TOC following for PT eval.

## 2023-05-27 NOTE — Evaluation (Signed)
 Physical Therapy Evaluation Patient Details Name: Cody Dickerson MRN: [id number] DOB: [birth year] Today's Date: 05/27/2023  History of Present Illness  73 yo male in ED d/t falls at home. PMH: Parkinson's  Clinical Impression  Pt admitted with above diagnosis.  Pt agreeable to work with PT. Cody Dickerson is overall min to mod assist for functional mobility, amb ~ 42' with min assist and RW; pt has had multiple recent falls at home. Pt is at risk for continued falls d/t weakness, balance impairments and decr coordination.  Pt will benefit from SNF post acute.    Pt currently with functional limitations due to the deficits listed below (see PT Problem List). Pt will benefit from acute skilled PT to increase their independence and safety with mobility to allow discharge.           If plan is discharge home, recommend the following: A little help with walking and/or transfers;A little help with bathing/dressing/bathroom;Assistance with cooking/housework;Assist for transportation;Help with stairs or ramp for entrance   Can travel by private vehicle        Equipment Recommendations None recommended by PT  Recommendations for Other Services       Functional Status Assessment Patient has had a recent decline in their functional status and demonstrates the ability to make significant improvements in function in a reasonable and predictable amount of time.     Precautions / Restrictions Precautions Precautions: Fall Restrictions Weight Bearing Restrictions Per Provider Order: No      Mobility  Bed Mobility Overal bed mobility: Needs Assistance Bed Mobility: Rolling, Sidelying to Sit, Sit to Sidelying Rolling: Min assist, Mod assist Sidelying to sit: Min assist, Mod assist     Sit to sidelying: Mod assist General bed mobility comments: assist to roll, progress LEs off bed, pt able to self assist trunk to upright. assist to brign LEs on to stretcher for return to s/l. cues  for technique     Transfers Overall transfer level: Needs assistance Equipment used: Rolling walker (2 wheels) Transfers: Sit to/from Stand Sit to Stand: Min assist           General transfer comment: STS x3 to chair back; STS x1 to RW. cues for incr knee flexion and trunk extension    Ambulation/Gait Ambulation/Gait assistance: Min assist Gait Distance (Feet): 80 Feet Assistive device: Rolling walker (2 wheels) Gait Pattern/deviations: Decreased step length - right, Decreased step length - left, Narrow base of support, Trunk flexed, Shuffle       General Gait Details: verbal and tactile cues for trunk extension, incr step length . assist to steady RW  Stairs            Wheelchair Mobility     Tilt Bed    Modified Rankin (Stroke Patients Only)       Balance Overall balance assessment: Needs assistance, History of Falls Sitting-balance support: No upper extremity supported, Feet supported Sitting balance-Leahy Scale: Fair     Standing balance support: Reliant on assistive device for balance, During functional activity Standing balance-Leahy Scale: Poor                               Pertinent Vitals/Pain Pain Assessment Pain Assessment: No/denies pain    Home Living Family/patient expects to be discharged to:: Skilled nursing facility Living Arrangements: Spouse/significant other Available Help at Discharge: Family Type of Home: House Home Access: Stairs to enter   [employer] of Steps: 2-3  Home Layout: One level Home Equipment: Rollator (4 wheels);Cane - single point      Prior Function Prior Level of Function : Independent/Modified Independent                     Extremity/Trunk Assessment   Upper Extremity Assessment Upper Extremity Assessment: Overall WFL for tasks assessed    Lower Extremity Assessment Lower Extremity Assessment: Generalized weakness (bil LEs edematous, knee flexion limited to ~ 75 degrees)     Cervical / Trunk Assessment Cervical / Trunk Assessment: Other exceptions Cervical / Trunk Exceptions: rounded shoulders, fwd head  Communication   Communication Communication: No apparent difficulties    Cognition Arousal: Alert Behavior During Therapy: WFL for tasks assessed/performed, Flat affect   PT - Cognitive impairments: No apparent impairments                       PT - Cognition Comments: slight delay in responses at times Following commands: Intact       Cueing Cueing Techniques: Verbal cues     General Comments      Exercises     Assessment/Plan    PT Assessment Patient needs continued PT services  PT Problem List Decreased mobility;Decreased coordination;Decreased activity tolerance;Decreased balance       PT Treatment Interventions DME instruction;Gait training;Functional mobility training;Therapeutic activities;Patient/family education;Therapeutic exercise;Balance training;Neuromuscular re-education    PT Goals (Current goals can be found in the Care Plan section)  Acute Rehab PT Goals Patient Stated Goal: get better and stop falling PT Goal Formulation: With patient Time For Goal Achievement: 06/10/23 Potential to Achieve Goals: Good    Frequency Min 3X/week     Co-evaluation               AM-PAC PT "6 Clicks" Mobility  Outcome Measure Help needed turning from your back to your side while in a flat bed without using bedrails?: A Little Help needed moving from lying on your back to sitting on the side of a flat bed without using bedrails?: A Little Help needed moving to and from a bed to a chair (including a wheelchair)?: A Little Help needed standing up from a chair using your arms (e.g., wheelchair or bedside chair)?: A Little Help needed to walk in hospital room?: A Little Help needed climbing 3-5 steps with a railing? : A Lot 6 Click Score: 17    End of Session Equipment Utilized During Treatment: Gait belt Activity  Tolerance: Patient tolerated treatment well Patient left: with call bell/phone within reach;in bed;with family/visitor present   PT Visit Diagnosis: Other abnormalities of gait and mobility (R26.89);History of falling (Z91.81)    Time: 7425-9563 PT Time Calculation (min) (ACUTE ONLY): 28 min   Charges:   PT Evaluation $PT Eval Low Complexity: 1 Low PT Treatments $Gait Training: 8-22 mins PT General Charges $$ ACUTE PT VISIT: 1 Visit         Anie Juniel, PT  Acute Rehab Dept [facility]) [phone]  05/27/2023   [facility] 05/27/2023, 1:53 PM

## 2023-05-27 NOTE — Telephone Encounter (Signed)
 Called patient back he is possibly going to [facility] for Rehab

## 2023-05-27 NOTE — Telephone Encounter (Signed)
 Pt left a voicemail stating that he was told to call back in 15 mins if he had not heard from our office, he states that he has not heard so he was calling back

## 2023-05-27 NOTE — Progress Notes (Signed)
 Pt faxed out. Bed offers pending. CSW outreached to [place] to review.

## 2023-05-27 NOTE — TOC Progression Note (Signed)
 Transition of Care [facility]) - Progression Note    Patient Details  Name: Cody Dickerson MRN: [id number] Date of Birth: 28-Jul-1950  Transition of [facility]) CM/SW Contact  Dannielle Karvonen Phone Number: 05/27/2023, 8:33 PM  Clinical Narrative:     CSW spoke with pt's spouse Cody Dickerson, advised that the only current bed offer is [facility], she states she still would prefer [facility] as it's closer to her home. TOC will continue to follow.         Expected Discharge Plan and Services                                               Social Determinants of Health (SDOH) Interventions SDOH Screenings   Depression (PHQ2-9): Low Risk  (08/04/2019)  Tobacco Use: Medium Risk (04/27/2023)    Readmission Risk Interventions     No data to display

## 2023-05-27 NOTE — ED Notes (Signed)
 Patient transported to CT

## 2023-05-27 NOTE — ED Triage Notes (Signed)
 Pt had a fall yesterday 3rd in a wk. Pt and wife report increasing weakness. Denies hitting head and denies blood thinners. Hx of HTN. Pt has hx of parkinson's. Did not take his medications yet this morning.

## 2023-05-28 DIAGNOSIS — I10 Essential (primary) hypertension: Secondary | ICD-10-CM | POA: Diagnosis not present

## 2023-05-28 DIAGNOSIS — Z7401 Bed confinement status: Secondary | ICD-10-CM | POA: Diagnosis not present

## 2023-05-28 DIAGNOSIS — G20A1 Parkinson's disease without dyskinesia, without mention of fluctuations: Secondary | ICD-10-CM | POA: Diagnosis not present

## 2023-05-28 DIAGNOSIS — L565 Disseminated superficial actinic porokeratosis (DSAP): Secondary | ICD-10-CM | POA: Diagnosis not present

## 2023-05-28 DIAGNOSIS — G2581 Restless legs syndrome: Secondary | ICD-10-CM | POA: Diagnosis not present

## 2023-05-28 DIAGNOSIS — R296 Repeated falls: Secondary | ICD-10-CM | POA: Diagnosis not present

## 2023-05-28 DIAGNOSIS — R278 Other lack of coordination: Secondary | ICD-10-CM | POA: Diagnosis not present

## 2023-05-28 DIAGNOSIS — K56609 Unspecified intestinal obstruction, unspecified as to partial versus complete obstruction: Secondary | ICD-10-CM | POA: Diagnosis not present

## 2023-05-28 DIAGNOSIS — G20A2 Parkinson's disease without dyskinesia, with fluctuations: Secondary | ICD-10-CM | POA: Diagnosis not present

## 2023-05-28 DIAGNOSIS — R531 Weakness: Secondary | ICD-10-CM | POA: Diagnosis not present

## 2023-05-28 DIAGNOSIS — G20C Parkinsonism, unspecified: Secondary | ICD-10-CM | POA: Diagnosis not present

## 2023-05-28 DIAGNOSIS — M6281 Muscle weakness (generalized): Secondary | ICD-10-CM | POA: Diagnosis not present

## 2023-05-28 DIAGNOSIS — E785 Hyperlipidemia, unspecified: Secondary | ICD-10-CM | POA: Diagnosis not present

## 2023-05-28 DIAGNOSIS — E7800 Pure hypercholesterolemia, unspecified: Secondary | ICD-10-CM | POA: Diagnosis not present

## 2023-05-28 DIAGNOSIS — M2689 Other dentofacial anomalies: Secondary | ICD-10-CM | POA: Diagnosis not present

## 2023-05-28 DIAGNOSIS — R2689 Other abnormalities of gait and mobility: Secondary | ICD-10-CM | POA: Diagnosis not present

## 2023-05-28 DIAGNOSIS — B351 Tinea unguium: Secondary | ICD-10-CM | POA: Diagnosis not present

## 2023-05-28 DIAGNOSIS — R2681 Unsteadiness on feet: Secondary | ICD-10-CM | POA: Diagnosis not present

## 2023-05-28 DIAGNOSIS — Z736 Limitation of activities due to disability: Secondary | ICD-10-CM | POA: Diagnosis not present

## 2023-05-28 DIAGNOSIS — R600 Localized edema: Secondary | ICD-10-CM | POA: Diagnosis not present

## 2023-05-28 DIAGNOSIS — Z79899 Other long term (current) drug therapy: Secondary | ICD-10-CM | POA: Diagnosis not present

## 2023-05-28 NOTE — ED Provider Notes (Addendum)
  Physical Exam  BP (!) 157/80   Pulse (!) 48   Temp 98.5 F (36.9 C)   Resp 15   Ht 5\' 9"  (1.753 m)   Wt 72.6 kg   SpO2 98%   BMI 23.63 kg/m   Physical Exam  Procedures  Procedures  ED Course / MDM    Medical Decision Making Amount and/or Complexity of Data Reviewed Labs: ordered. Radiology: ordered.  Risk Prescription drug management.   Patient history of Parkinson's disease.  Unable to manage well at home now.  Pending skilled nursing placement.       Benjiman Core, MD 05/28/23 [phone]  Patient reportedly accepted at [facility] for today.    Benjiman Core, MD 05/28/23 [phone]

## 2023-05-28 NOTE — ED Notes (Signed)
 Report given to [facility] LPN at [facility]

## 2023-05-28 NOTE — Progress Notes (Addendum)
 [facility] offere bed. CSW presented offer to wife, Meriam Sprague, who accepts offer. CSW notified [place]. She prefers pt transport via [facility]. EDP and RN aware of discharge plan.   Addend @ 9:40AM Discharge pending - awaiting PASRR.   Addend @ 1:44PM [phone] A: PASRR   Sent to [place] at [facility]. [facility] to transport,

## 2023-06-01 ENCOUNTER — Other Ambulatory Visit: Payer: Self-pay | Admitting: *Deleted

## 2023-06-01 DIAGNOSIS — I10 Essential (primary) hypertension: Secondary | ICD-10-CM | POA: Diagnosis not present

## 2023-06-01 DIAGNOSIS — E7800 Pure hypercholesterolemia, unspecified: Secondary | ICD-10-CM | POA: Diagnosis not present

## 2023-06-01 NOTE — Patient Outreach (Signed)
 Mr. Cody Dickerson recently admitted under [facility] SNF waiver for admission to [facility] SN[facility].  Screening for potential complex care management services as benefit of health plan and primary care provider.  Collaboration with Deseree, Geophysicist/field seismologist. Mr. Cody Dickerson is from home with spouse. Care plan meeting scheduled for today.   Will continue to follow.   Raiford Noble, MSN, RN, BSN [place]  [facility], [facility] Commun[facility] RN Post- Acute Care Manager Direct Dial: [phone]

## 2023-06-04 DIAGNOSIS — E785 Hyperlipidemia, unspecified: Secondary | ICD-10-CM

## 2023-06-04 DIAGNOSIS — K56609 Unspecified intestinal obstruction, unspecified as to partial versus complete obstruction: Secondary | ICD-10-CM | POA: Diagnosis not present

## 2023-06-04 DIAGNOSIS — G20A1 Parkinson's disease without dyskinesia, without mention of fluctuations: Secondary | ICD-10-CM

## 2023-06-04 DIAGNOSIS — R2689 Other abnormalities of gait and mobility: Secondary | ICD-10-CM | POA: Diagnosis not present

## 2023-06-04 DIAGNOSIS — R2681 Unsteadiness on feet: Secondary | ICD-10-CM | POA: Diagnosis not present

## 2023-06-04 DIAGNOSIS — G2581 Restless legs syndrome: Secondary | ICD-10-CM

## 2023-06-05 ENCOUNTER — Ambulatory Visit: Payer: Medicare Other | Admitting: Podiatry

## 2023-06-08 NOTE — Progress Notes (Unsigned)
 Assessment/Plan:   1.  Parkinsons Disease  -DaTscan previously fairly equivocal, butmeets clinical criteria  -Increase carbidopa/levodopa 25/100, 2/2/2  -Continue pramipexole 0.5 mg 3 times per day.  -discussed using cane faithfully.  Discussed however, that this won't help when he bends over and picks up sticks/gumballs.  This is just not a recommended activity and will likely cause falls due to Parkinsons Disease and OH  2.  Left distal supraclinoid ICA aneurysm  -CTA was repeated in April, 2022 and aneurysm size was stable (small, 2mm).    3.  MCI  -Neurocognitive testing done with Dr. Roseanne Reno in September, 2022 demonstrated MCI.  I don't see any progression  4.  Dizziness, improved  -don't think that this is related to Parkinsons Disease or meds, as I d/c the carbidopa/levodopa and it didn't change sx's.   -Dr. Jacinto Halim has discontinued his amlodipine due to significant orthostasis in his office.  Has improved some.   Subjective:   Cody Dickerson was seen today in follow up for Parkinsons disease.  My previous records were reviewed prior to todays visit as well as outside records available to me. Pt with wife who supplements hx. patient worked in today because he was not doing well.  Numerous records are reviewed.  I did increase his levodopa last visit.  Not long after the last visit he called about persistent loss of balance.  MRI brain was ordered.  It was ordered on February 4 but was not done until March 17.  This was essentially unremarkable and it was nonacute.  They have been asked about nursing home in the [place] area, which would have been a dramatic change since I saw him, so we asked for him to come back in person just to make sure that there was nothing else that we could see neurologically (he was last seen in December).  He was in the emergency room on March 26 because of weakness.  CT brain was nonacute.  Social work consult was done and noted that patient qualified for  SNF placement.  He was subsequently sent to [facility] for rehab.  Current prescribed movement disorder medications: carbidopa/levodopa 25/100,2/2/2 Pramipexole, 0.5 mg 3 times per day   Previous meds:  lexapro (took for a few days and felt it caused decreased energy)  ALLERGIES:   Allergies  Allergen Reactions   Escitalopram Other (See Comments)    "Felt weird and tired"   Lisinopril Cough    CURRENT MEDICATIONS:  Outpatient Encounter Medications as of 06/09/2023  Medication Sig   ADVIL 200 MG CAPS Take 400 mg by mouth every 6 (six) hours as needed (for pain or headaches).   carbidopa-levodopa (SINEMET IR) 25-100 MG tablet Take 2 tablets by mouth 3 (three) times daily. 7am/11am/4pm (Patient taking differently: Take 2 tablets by mouth See admin instructions. Take 2 tablet by mouth at 7 AM, 11 AM, and 4 PM)   diazepam (VALIUM) 5 MG tablet 1 tab 1 hour prior to test; may repeat 30 min prior to test prn (Patient not taking: Reported on 05/27/2023)   ezetimibe (ZETIA) 10 MG tablet Take 1 tablet (10 mg total) by mouth daily.   indomethacin (INDOCIN) 50 MG capsule Take 50 mg by mouth 3 (three) times daily as needed for moderate pain (gout pain).   pramipexole (MIRAPEX) 0.5 MG tablet TAKE 1 TABLET BY MOUTH THREE TIMES DAILY (Patient taking differently: Take 0.5 mg by mouth See admin instructions. Take 0.5 mg by mouth at 7 AM, 11  AM, and 4 PM)   rosuvastatin (CRESTOR) 20 MG tablet Take 20 mg by mouth daily.    No facility-administered encounter medications on file as of 06/09/2023.    Objective:   PHYSICAL EXAMINATION:    VITALS:   There were no vitals filed for this visit.    GEN:  The patient appears stated age and is in NAD. HEENT:  Normocephalic.  Some old ecchymosis (very yellow) on both sides of face/eyes but mostly gone (from 3 weeks ago).    The mucous membranes are moist. The superficial temporal arteries are without ropiness or tenderness. CV:  RRR Lungs:  CTAB Neck/HEME:   There are no carotid bruits bilaterally.  Neurological examination:  Orientation: The patient is alert and oriented x3. Cranial nerves: There is good facial symmetry with facial hypomimia. The speech is fluent and clear. Soft palate rises symmetrically and there is no tongue deviation. Hearing is intact to conversational tone. Sensation: Sensation is intact to light touch throughout Motor: Strength is at least antigravity x4.  Movement examination: Tone: There is mild increased in tone in the bilateral UE Abnormal movements: none Coordination:  There is mild to mod decremation, with any form of RAMS, including alternating supination and pronation of the forearm, hand opening and closing, finger taps, heel taps and toe taps bilaterally Gait and Station: The patient pushes off to arise. The patient's stride length is good.  He is holding his cane.    Total time spent on today's visit was *** minutes, including both face-to-face time and nonface-to-face time.  Time included that spent on review of records (prior notes available to me/labs/imaging if pertinent), discussing treatment and goals, answering patient's questions and coordinating care.   Cc:  Daisy Floro, MD

## 2023-06-09 ENCOUNTER — Encounter: Payer: Self-pay | Admitting: Neurology

## 2023-06-09 ENCOUNTER — Other Ambulatory Visit: Payer: Self-pay

## 2023-06-09 ENCOUNTER — Ambulatory Visit (INDEPENDENT_AMBULATORY_CARE_PROVIDER_SITE_OTHER): Admitting: Neurology

## 2023-06-09 VITALS — BP 144/88 | HR 81 | Ht 69.0 in | Wt 160.0 lb

## 2023-06-09 DIAGNOSIS — G20A1 Parkinson's disease without dyskinesia, without mention of fluctuations: Secondary | ICD-10-CM | POA: Diagnosis not present

## 2023-06-09 MED ORDER — PRAMIPEXOLE DIHYDROCHLORIDE 0.5 MG PO TABS: 0.5000 mg | ORAL_TABLET | Freq: Three times a day (TID) | ORAL | 0 refills | Status: DC

## 2023-06-09 MED ORDER — CARBIDOPA-LEVODOPA 25-100 MG PO TABS: 2.0000 | ORAL_TABLET | Freq: Three times a day (TID) | ORAL | 0 refills | Status: DC

## 2023-06-15 ENCOUNTER — Other Ambulatory Visit: Payer: Self-pay | Admitting: *Deleted

## 2023-06-15 NOTE — Patient Outreach (Signed)
 Mr. Wauneta Haddock resides in [facility]. [facility] SNF waiver utilized for admission to [facility].   Collaboration meeting with Deseree, [employer] social worker. Anticipated transition plans is to return home with spouse. Therapy home visit was performed. Additional equipment needed. Likely transition home soon.   PCP office [facility] has care management services available.  Nolberto Batty, MSN, RN, BSN [place]  [facility], Healthy Communities RN Post- Acute Care Manager Direct Dial: [phone]

## 2023-06-17 DIAGNOSIS — I10 Essential (primary) hypertension: Secondary | ICD-10-CM | POA: Diagnosis not present

## 2023-06-21 ENCOUNTER — Encounter (HOSPITAL_COMMUNITY): Payer: Self-pay | Admitting: Emergency Medicine

## 2023-06-21 ENCOUNTER — Emergency Department (HOSPITAL_COMMUNITY)
Admission: EM | Admit: 2023-06-21 | Discharge: 2023-06-21 | Disposition: A | Discharge status: Home / Self Care | Attending: Emergency Medicine | Admitting: Emergency Medicine

## 2023-06-21 ENCOUNTER — Emergency Department (HOSPITAL_COMMUNITY)

## 2023-06-21 ENCOUNTER — Other Ambulatory Visit: Payer: Self-pay

## 2023-06-21 DIAGNOSIS — Y92009 Unspecified place in unspecified non-institutional (private) residence as the place of occurrence of the external cause: Secondary | ICD-10-CM | POA: Insufficient documentation

## 2023-06-21 DIAGNOSIS — G20C Parkinsonism, unspecified: Secondary | ICD-10-CM | POA: Diagnosis not present

## 2023-06-21 DIAGNOSIS — R296 Repeated falls: Secondary | ICD-10-CM | POA: Diagnosis not present

## 2023-06-21 DIAGNOSIS — S0990XA Unspecified injury of head, initial encounter: Secondary | ICD-10-CM | POA: Diagnosis not present

## 2023-06-21 DIAGNOSIS — R58 Hemorrhage, not elsewhere classified: Secondary | ICD-10-CM | POA: Diagnosis not present

## 2023-06-21 DIAGNOSIS — S0181XA Laceration without foreign body of other part of head, initial encounter: Secondary | ICD-10-CM

## 2023-06-21 DIAGNOSIS — M4312 Spondylolisthesis, cervical region: Secondary | ICD-10-CM | POA: Diagnosis not present

## 2023-06-21 DIAGNOSIS — M4182 Other forms of scoliosis, cervical region: Secondary | ICD-10-CM | POA: Diagnosis not present

## 2023-06-21 DIAGNOSIS — S01112A Laceration without foreign body of left eyelid and periocular area, initial encounter: Secondary | ICD-10-CM | POA: Diagnosis not present

## 2023-06-21 DIAGNOSIS — W06XXXA Fall from bed, initial encounter: Secondary | ICD-10-CM | POA: Insufficient documentation

## 2023-06-21 DIAGNOSIS — M47812 Spondylosis without myelopathy or radiculopathy, cervical region: Secondary | ICD-10-CM | POA: Diagnosis not present

## 2023-06-21 DIAGNOSIS — Z23 Encounter for immunization: Secondary | ICD-10-CM | POA: Insufficient documentation

## 2023-06-21 DIAGNOSIS — W19XXXA Unspecified fall, initial encounter: Secondary | ICD-10-CM | POA: Diagnosis not present

## 2023-06-21 DIAGNOSIS — I10 Essential (primary) hypertension: Secondary | ICD-10-CM | POA: Diagnosis not present

## 2023-06-21 DIAGNOSIS — Z043 Encounter for examination and observation following other accident: Secondary | ICD-10-CM | POA: Diagnosis not present

## 2023-06-21 MED ORDER — TETANUS-DIPHTH-ACELL PERTUSSIS 5-2.5-18.5 LF-MCG/0.5 IM SUSY
0.5000 mL | PREFILLED_SYRINGE | Freq: Once | INTRAMUSCULAR | Status: AC
  Administered 2023-06-21: 0.5 mL via INTRAMUSCULAR
  Filled 2023-06-21: qty 0.5

## 2023-06-21 MED ORDER — LIDOCAINE-EPINEPHRINE (PF) 2 %-1:200000 IJ SOLN
10.0000 mL | Freq: Once | INTRAMUSCULAR | Status: AC
  Administered 2023-06-21: 10 mL
  Filled 2023-06-21: qty 20

## 2023-06-21 NOTE — ED Provider Notes (Signed)
 [facility] AT [facility] Provider Note   CSN: [id number] Arrival date & time: 06/21/23  0145     History  Chief Complaint  Patient presents with   Fall   Laceration    Cody Dickerson is a 73 y.o. male.  Patient presents to emergency department for evaluation after a fall.  Patient reports that he has Parkinson's which causes him balance difficulties.  Patient reports that he fell out of bed trying to use his urinal, did hit his head and has a laceration of the left eyebrow.  No loss of consciousness.  He did have another fall earlier in the day and suffered a small abrasion to his scalp at that time.       Home Medications Prior to Admission medications   Medication Sig Start Date End Date Taking? Authorizing Provider  ADVIL  200 MG CAPS Take 400 mg by mouth every 6 (six) hours as needed (for pain or headaches).   Yes [provider]  carbidopa -levodopa  (SINEMET  IR) 25-100 MG tablet Take 2 tablets by mouth 3 (three) times daily. Take 2 tab  at 8 am Take 2 tab at 12 pm Take 2 tablets at 4 pm 06/09/23  Yes Tat, Von Grumbling, DO  ezetimibe  (ZETIA ) 10 MG tablet Take 1 tablet (10 mg total) by mouth daily. 08/12/22 06/21/23 Yes Knox Perl, MD  indomethacin (INDOCIN) 50 MG capsule Take 50 mg by mouth 3 (three) times daily as needed for moderate pain (gout pain). 12/17/17  Yes [provider]  pramipexole  (MIRAPEX ) 0.5 MG tablet Take 1 tablet (0.5 mg total) by mouth 3 (three) times daily. Take 1 tablet at 8am Take 1 tablet at 12 pm and 1 tablet at 4pm 06/09/23  Yes Tat, Ivette Marks S, DO  rosuvastatin  (CRESTOR ) 20 MG tablet Take 20 mg by mouth daily.  12/17/17  Yes [provider]  diazepam  (VALIUM ) 5 MG tablet 1 tab 1 hour prior to test; may repeat 30 min prior to test prn Patient not taking: Reported on 05/27/2023 04/27/23   Tat, Von Grumbling, DO      Allergies    Escitalopram  and Lisinopril    Review of Systems   Review of  Systems  Physical Exam Updated Vital Signs BP 136/66   Pulse 65   Temp 98 F (36.7 C) (Oral)   Resp 17   SpO2 97%  Physical Exam Vitals and nursing note reviewed.  Constitutional:      General: He is not in acute distress.    Appearance: He is well-developed.  HENT:     Head: Normocephalic and atraumatic.      Mouth/Throat:     Mouth: Mucous membranes are moist.  Eyes:     General: Vision grossly intact. Gaze aligned appropriately.     Extraocular Movements: Extraocular movements intact.     Conjunctiva/sclera: Conjunctivae normal.  Cardiovascular:     Rate and Rhythm: Normal rate and regular rhythm.     Pulses: Normal pulses.     Heart sounds: Normal heart sounds, S1 normal and S2 normal. No murmur heard.    No friction rub. No gallop.  Pulmonary:     Effort: Pulmonary effort is normal. No respiratory distress.     Breath sounds: Normal breath sounds.  Abdominal:     Palpations: Abdomen is soft.     Tenderness: There is no abdominal tenderness. There is no guarding or rebound.     Hernia: No hernia is present.  Musculoskeletal:  General: No swelling.     Cervical back: Full passive range of motion without pain, normal range of motion and neck supple. No pain with movement, spinous process tenderness or muscular tenderness. Normal range of motion.     Right lower leg: No edema.     Left lower leg: No edema.  Skin:    General: Skin is warm and dry.     Capillary Refill: Capillary refill takes less than 2 seconds.     Findings: No ecchymosis, erythema, lesion or wound.  Neurological:     Mental Status: He is alert and oriented to person, place, and time.     GCS: GCS eye subscore is 4. GCS verbal subscore is 5. GCS motor subscore is 6.     Cranial Nerves: Cranial nerves 2-12 are intact.     Sensory: Sensation is intact.     Motor: Motor function is intact. No weakness or abnormal muscle tone.     Coordination: Coordination is intact.  Psychiatric:        Mood  and Affect: Mood normal.        Speech: Speech normal.        Behavior: Behavior normal.     ED Results / Procedures / Treatments   Labs (all labs ordered are listed, but only abnormal results are displayed) Labs Reviewed - No data to display  EKG None  Radiology CT CERVICAL SPINE WO CONTRAST Result Date: 06/21/2023 CLINICAL DATA:  73 year old male with recurrent falls. EXAM: CT CERVICAL SPINE WITHOUT CONTRAST TECHNIQUE: Multidetector CT imaging of the cervical spine was performed without intravenous contrast. Multiplanar CT image reconstructions were also generated. RADIATION DOSE REDUCTION: This exam was performed according to the departmental dose-optimization program which includes automated exposure control, adjustment of the mA and/or kV according to patient size and/or use of iterative reconstruction technique. COMPARISON:  Head CT today.  Brain MRI 05/18/2023. FINDINGS: Alignment: Straightening of cervical lordosis with mild degenerative anterolisthesis C3-C4 and C4-C5. Dextroconvex cervical scoliosis. Cervicothoracic junction alignment is within normal limits. Bilateral posterior element alignment is within normal limits. Skull base and vertebrae: Bone mineralization is within normal limits for age. Visualized skull base is intact. No atlanto-occipital dissociation. C1 and C2 appear intact and aligned. No acute osseous abnormality identified. Soft tissues and spinal canal: No prevertebral fluid or swelling. No visible canal hematoma. Calcified atherosclerosis, otherwise negative visible noncontrast neck soft tissues. Disc levels: Cervical spine disc degeneration and facet arthropathy in the setting of multilevel anterolisthesis. Mild cervical spinal stenosis is possible at C3-C4. Upper chest: Visible upper thoracic levels appear intact. Symmetric apical lung scarring. IMPRESSION: 1. No acute traumatic injury identified in the cervical spine. 2. Cervical spine degeneration in the setting of  multilevel mild spondylolisthesis. Electronically Signed   By: Marlise Simpers M.D.   On: 06/21/2023 04:03   CT HEAD WO CONTRAST ( ) Result Date: 06/21/2023 CLINICAL DATA:  73 year old male with recurrent falls. EXAM: CT HEAD WITHOUT CONTRAST TECHNIQUE: Contiguous axial images were obtained from the base of the skull through the vertex without intravenous contrast. RADIATION DOSE REDUCTION: This exam was performed according to the departmental dose-optimization program which includes automated exposure control, adjustment of the mA and/or kV according to patient size and/or use of iterative reconstruction technique. COMPARISON:  Brain MRI 05/18/2023.  Head CT 05/27/2023. FINDINGS: Brain: Stable cerebral volume. No midline shift, ventriculomegaly, mass effect, evidence of mass lesion, intracranial hemorrhage or evidence of cortically based acute infarction. Gray-white differentiation stable and within normal limits for  age. Vascular: Extensive calcified atherosclerosis at the skull base. No suspicious intracranial vascular hyperdensity. Skull: Stable, intact.  No fracture identified. Sinuses/Orbits: Visualized paranasal sinuses and mastoids are stable and well aerated. Other: Left superior vertex mild scalp hematoma or contusion series 5, image 43. No scalp soft tissue gas. Stable orbits. IMPRESSION: 1. Left superior vertex scalp hematoma or contusion. No skull fracture identified. 2. Stable and negative for age non contrast CT appearance of the brain. Electronically Signed   By: Marlise Simpers M.D.   On: 06/21/2023 04:00    Procedures .Laceration Repair  Date/Time: 06/21/2023 5:29 AM  Performed by: Ballard Bongo, MD Authorized by: Ballard Bongo, MD   Consent:    Consent obtained:  Verbal   Consent given by:  Patient   Risks, benefits, and alternatives were discussed: yes     Risks discussed:  Infection, pain, retained foreign body and poor cosmetic result Universal protocol:    Procedure  explained and questions answered to patient or proxy's satisfaction: yes     Relevant documents present and verified: yes     Test results available: yes     Imaging studies available: yes     Required blood products, implants, devices, and special equipment available: yes     Site/side marked: yes     Immediately prior to procedure, a time out was called: yes     Patient identity confirmed:  Verbally with patient Anesthesia:    Anesthesia method:  Local infiltration   Local anesthetic:  Lidocaine  2% WITH epi Laceration details:    Location:  Face   Face location:  L eyebrow   Length (cm):  2.5 Pre-procedure details:    Preparation:  Patient was prepped and draped in usual sterile fashion and imaging obtained to evaluate for foreign bodies Exploration:    Hemostasis achieved with:  Epinephrine   Contaminated: no   Treatment:    Area cleansed with:  Chlorhexidine   Amount of cleaning:  Standard   Irrigation solution:  Sterile saline   Irrigation method:  Syringe   Debridement:  None Skin repair:    Repair method:  Sutures   Suture size:  5-0   Suture material:  Fast-absorbing gut   Suture technique:  Simple interrupted   Number of sutures:  6 Approximation:    Approximation:  Close Repair type:    Repair type:  Simple Post-procedure details:    Dressing:  Open (no dressing)   Procedure completion:  Tolerated well, no immediate complications     Medications Ordered in ED Medications  Tdap (BOOSTRIX) injection 0.5 mL (0.5 mLs Intramuscular Given 06/21/23 0218)  lidocaine -EPINEPHrine (XYLOCAINE  W/EPI) 2 %-1:200000 (PF) injection 10 mL (10 mLs Infiltration Given by Other 06/21/23 4098)    ED Course/ Medical Decision Making/ A&P                                 Medical Decision Making Amount and/or Complexity of Data Reviewed Radiology: ordered.  Risk Prescription drug management.   Presents evaluation after a fall.  Patient has balance issues secondary to  Parkinson's and fell tonight while trying to use his urinal.  He presents with a laceration to the left eyebrow.  CT head and cervical spine do not show any acute abnormality.  Patient's examination is otherwise unremarkable, no other signs of injury elsewhere.  Aspiration sutured, fast-absorbing plain gut utilized.  Wife reports that he seems to  be declining lately.  He has previously gone to skilled nursing facility for rehab, she thinks he may need to go back.  Will place TOC in face-to-face consult for outpatient follow-up.        Final Clinical Impression(s) / ED Diagnoses Final diagnoses:  Facial laceration, initial encounter    Rx / DC Orders ED Discharge Orders          Ordered    Face-to-face encounter (required for Medicare/Medicaid patients)       Comments: I Ballard Bongo certify that this patient is under my care and that I, or a nurse practitioner or physician's assistant working with me, had a face-to-face encounter that meets the physician face-to-face encounter requirements with this patient on 06/21/2023. The encounter with the patient was in whole, or in part for the following medical condition(s) which is the primary reason for home health care (List medical condition): weakness and falls   06/21/23 0510    Consult to Transition of Care Team       Provider:  (Not yet assigned)   06/21/23 0510              Ballard Bongo, MD 06/21/23 [phone]

## 2023-06-21 NOTE — ED Triage Notes (Signed)
 Pt presents to ED via EMS post fall at home. Patient reports 2 falls. No injuries with first fall. 2nd fall patient states he was trying to get to urinal and tripped. Hit head on unknown object and sustained laceration above L eye. Denies hitting head and blood thinners. EMS states hx of falls.

## 2023-06-22 DIAGNOSIS — S0180XD Unspecified open wound of other part of head, subsequent encounter: Secondary | ICD-10-CM | POA: Diagnosis not present

## 2023-06-22 DIAGNOSIS — Z9181 History of falling: Secondary | ICD-10-CM | POA: Diagnosis not present

## 2023-06-22 DIAGNOSIS — E785 Hyperlipidemia, unspecified: Secondary | ICD-10-CM | POA: Diagnosis not present

## 2023-06-22 DIAGNOSIS — G20C Parkinsonism, unspecified: Secondary | ICD-10-CM | POA: Diagnosis not present

## 2023-06-22 DIAGNOSIS — G2581 Restless legs syndrome: Secondary | ICD-10-CM | POA: Diagnosis not present

## 2023-06-24 DIAGNOSIS — Z9181 History of falling: Secondary | ICD-10-CM | POA: Diagnosis not present

## 2023-06-24 DIAGNOSIS — G20C Parkinsonism, unspecified: Secondary | ICD-10-CM | POA: Diagnosis not present

## 2023-06-24 DIAGNOSIS — S0180XD Unspecified open wound of other part of head, subsequent encounter: Secondary | ICD-10-CM | POA: Diagnosis not present

## 2023-06-24 DIAGNOSIS — G2581 Restless legs syndrome: Secondary | ICD-10-CM | POA: Diagnosis not present

## 2023-06-24 DIAGNOSIS — E785 Hyperlipidemia, unspecified: Secondary | ICD-10-CM | POA: Diagnosis not present

## 2023-06-25 ENCOUNTER — Other Ambulatory Visit: Admitting: Neurology

## 2023-06-25 DIAGNOSIS — Z9181 History of falling: Secondary | ICD-10-CM | POA: Diagnosis not present

## 2023-06-25 DIAGNOSIS — G2581 Restless legs syndrome: Secondary | ICD-10-CM | POA: Diagnosis not present

## 2023-06-25 DIAGNOSIS — E785 Hyperlipidemia, unspecified: Secondary | ICD-10-CM | POA: Diagnosis not present

## 2023-06-25 DIAGNOSIS — G20C Parkinsonism, unspecified: Secondary | ICD-10-CM | POA: Diagnosis not present

## 2023-06-25 DIAGNOSIS — S0180XD Unspecified open wound of other part of head, subsequent encounter: Secondary | ICD-10-CM | POA: Diagnosis not present

## 2023-06-26 ENCOUNTER — Ambulatory Visit (INDEPENDENT_AMBULATORY_CARE_PROVIDER_SITE_OTHER): Admitting: Neurology

## 2023-06-26 DIAGNOSIS — G20C Parkinsonism, unspecified: Secondary | ICD-10-CM | POA: Diagnosis not present

## 2023-06-26 NOTE — Patient Instructions (Signed)
 INFORMATION FOR PATIENTS AFTER SKIN BIOPSY:   What You Need to Do:  1. Keep your current (large) bandage on for 24 hours and do not shower during this time. Leave today's bandage on until AFTER your next shower tomorrow.  Change your bandages every day starting tomorrow after that shower. Keep the bandages on while you shower. Change them once a day until a scab forms.  2. You can let the small steristrips fall off naturally and do not need to ever take them off.  They will eventually fall off on their own (after showering)  3. You may take showers after 24 hours, but DO NOT take tub baths, go in hot tubs or go swimming for seven days after the procedure.  4. You may use vasoline, bacitracin, or Polysporin ointment on the wounds as needed.  5. If a scab forms at the biopsy site, leave it alone.  6. If bleeding occurs, apply firm pressure for two minutes with a clean piece of gauze.   Please contact us  immediately if there is any redness, any signs of infection, or significant bleeding at the biopsy site.   Please call our office at [phone].

## 2023-06-26 NOTE — Procedures (Signed)
 Punch Biopsy Procedure Note  Preprocedure Diagnosis: Bradykinesia; Tremor;   Postprocedure Diagnosis: same  Locations: Site 1: Left posterior cervical Site 2: above, left knee;  Site 3: above, left foot  Indications: r/o alpha synucleinopathy  Anesthesia: 3 mL Lidocaine  1% with epinephrine without added sodium bicarbonate  Procedure Details Patient informed of the risks (including but not limited to bleeding, pain, infection, scar and infection) and benefits of the procedure.  Informed consent obtained.  The areas which were chosen for biopsy, as above, and surrounding areas were given a sterile prep using betadyne and draped in the usual sterile fashion. The skin was then stretched perpendicular to the skin tension lines and sample removed using the 3 mm punch. Pressure applied, hemostasis achieved.   Dressing applied. The specimen(s) was sent for pathologic examination. The patient tolerated the procedure well.  Estimated Blood Loss: 0 ml  Condition: Stable  Complications: none.  Plan: 1. Instructed to keep the wound dry and covered for 24-48h and clean thereafter. 2. Warning signs of infection were reviewed.

## 2023-06-28 DIAGNOSIS — S0180XD Unspecified open wound of other part of head, subsequent encounter: Secondary | ICD-10-CM | POA: Diagnosis not present

## 2023-06-28 DIAGNOSIS — G2581 Restless legs syndrome: Secondary | ICD-10-CM | POA: Diagnosis not present

## 2023-06-28 DIAGNOSIS — E785 Hyperlipidemia, unspecified: Secondary | ICD-10-CM | POA: Diagnosis not present

## 2023-06-28 DIAGNOSIS — G20C Parkinsonism, unspecified: Secondary | ICD-10-CM | POA: Diagnosis not present

## 2023-06-28 DIAGNOSIS — Z9181 History of falling: Secondary | ICD-10-CM | POA: Diagnosis not present

## 2023-06-29 ENCOUNTER — Other Ambulatory Visit: Payer: Self-pay | Admitting: *Deleted

## 2023-06-29 NOTE — Patient Outreach (Signed)
 Post-Acute Care Manager follow up. Verified in [facility] Mr. Cody Dickerson discharged from [facility] on 06/19/23. Home health provided by [facility]. Mr. Cody Dickerson previously utilized [facility] waiver for admission to [facility].   PCP office [facility] has care management services.   No identifiable complex care management needs.   Cody Batty, MSN, RN, BSN [place]  [facility] RN Post- Acute Care Manager Direct Dial: [phone]

## 2023-07-01 DIAGNOSIS — I10 Essential (primary) hypertension: Secondary | ICD-10-CM | POA: Diagnosis not present

## 2023-07-01 DIAGNOSIS — E7800 Pure hypercholesterolemia, unspecified: Secondary | ICD-10-CM | POA: Diagnosis not present

## 2023-07-02 DIAGNOSIS — S0180XD Unspecified open wound of other part of head, subsequent encounter: Secondary | ICD-10-CM | POA: Diagnosis not present

## 2023-07-02 DIAGNOSIS — Z9181 History of falling: Secondary | ICD-10-CM | POA: Diagnosis not present

## 2023-07-02 DIAGNOSIS — G2581 Restless legs syndrome: Secondary | ICD-10-CM | POA: Diagnosis not present

## 2023-07-02 DIAGNOSIS — G20C Parkinsonism, unspecified: Secondary | ICD-10-CM | POA: Diagnosis not present

## 2023-07-02 DIAGNOSIS — E785 Hyperlipidemia, unspecified: Secondary | ICD-10-CM | POA: Diagnosis not present

## 2023-07-03 DIAGNOSIS — W19XXXD Unspecified fall, subsequent encounter: Secondary | ICD-10-CM | POA: Diagnosis not present

## 2023-07-03 DIAGNOSIS — Z6826 Body mass index (BMI) 26.0-26.9, adult: Secondary | ICD-10-CM | POA: Diagnosis not present

## 2023-07-03 DIAGNOSIS — E7800 Pure hypercholesterolemia, unspecified: Secondary | ICD-10-CM | POA: Diagnosis not present

## 2023-07-03 DIAGNOSIS — G20A1 Parkinson's disease without dyskinesia, without mention of fluctuations: Secondary | ICD-10-CM | POA: Diagnosis not present

## 2023-07-03 DIAGNOSIS — Z09 Encounter for follow-up examination after completed treatment for conditions other than malignant neoplasm: Secondary | ICD-10-CM | POA: Diagnosis not present

## 2023-07-03 DIAGNOSIS — R8290 Unspecified abnormal findings in urine: Secondary | ICD-10-CM | POA: Diagnosis not present

## 2023-07-03 DIAGNOSIS — L219 Seborrheic dermatitis, unspecified: Secondary | ICD-10-CM | POA: Diagnosis not present

## 2023-07-03 DIAGNOSIS — R531 Weakness: Secondary | ICD-10-CM | POA: Diagnosis not present

## 2023-07-08 DIAGNOSIS — E785 Hyperlipidemia, unspecified: Secondary | ICD-10-CM | POA: Diagnosis not present

## 2023-07-08 DIAGNOSIS — S0180XD Unspecified open wound of other part of head, subsequent encounter: Secondary | ICD-10-CM | POA: Diagnosis not present

## 2023-07-08 DIAGNOSIS — G20C Parkinsonism, unspecified: Secondary | ICD-10-CM | POA: Diagnosis not present

## 2023-07-08 DIAGNOSIS — G2581 Restless legs syndrome: Secondary | ICD-10-CM | POA: Diagnosis not present

## 2023-07-08 DIAGNOSIS — Z9181 History of falling: Secondary | ICD-10-CM | POA: Diagnosis not present

## 2023-07-10 DIAGNOSIS — S0180XD Unspecified open wound of other part of head, subsequent encounter: Secondary | ICD-10-CM | POA: Diagnosis not present

## 2023-07-10 DIAGNOSIS — E785 Hyperlipidemia, unspecified: Secondary | ICD-10-CM | POA: Diagnosis not present

## 2023-07-10 DIAGNOSIS — G2581 Restless legs syndrome: Secondary | ICD-10-CM | POA: Diagnosis not present

## 2023-07-10 DIAGNOSIS — Z9181 History of falling: Secondary | ICD-10-CM | POA: Diagnosis not present

## 2023-07-10 DIAGNOSIS — G20C Parkinsonism, unspecified: Secondary | ICD-10-CM | POA: Diagnosis not present

## 2023-07-14 DIAGNOSIS — E785 Hyperlipidemia, unspecified: Secondary | ICD-10-CM | POA: Diagnosis not present

## 2023-07-14 DIAGNOSIS — G2581 Restless legs syndrome: Secondary | ICD-10-CM | POA: Diagnosis not present

## 2023-07-14 DIAGNOSIS — S0180XD Unspecified open wound of other part of head, subsequent encounter: Secondary | ICD-10-CM | POA: Diagnosis not present

## 2023-07-14 DIAGNOSIS — G20C Parkinsonism, unspecified: Secondary | ICD-10-CM | POA: Diagnosis not present

## 2023-07-14 DIAGNOSIS — Z9181 History of falling: Secondary | ICD-10-CM | POA: Diagnosis not present

## 2023-07-16 DIAGNOSIS — S0180XD Unspecified open wound of other part of head, subsequent encounter: Secondary | ICD-10-CM | POA: Diagnosis not present

## 2023-07-16 DIAGNOSIS — G20C Parkinsonism, unspecified: Secondary | ICD-10-CM | POA: Diagnosis not present

## 2023-07-16 DIAGNOSIS — E785 Hyperlipidemia, unspecified: Secondary | ICD-10-CM | POA: Diagnosis not present

## 2023-07-16 DIAGNOSIS — G2581 Restless legs syndrome: Secondary | ICD-10-CM | POA: Diagnosis not present

## 2023-07-16 DIAGNOSIS — Z9181 History of falling: Secondary | ICD-10-CM | POA: Diagnosis not present

## 2023-07-17 DIAGNOSIS — I10 Essential (primary) hypertension: Secondary | ICD-10-CM | POA: Diagnosis not present

## 2023-07-22 DIAGNOSIS — S0180XD Unspecified open wound of other part of head, subsequent encounter: Secondary | ICD-10-CM | POA: Diagnosis not present

## 2023-07-22 DIAGNOSIS — G2581 Restless legs syndrome: Secondary | ICD-10-CM | POA: Diagnosis not present

## 2023-07-22 DIAGNOSIS — E785 Hyperlipidemia, unspecified: Secondary | ICD-10-CM | POA: Diagnosis not present

## 2023-07-22 DIAGNOSIS — Z9181 History of falling: Secondary | ICD-10-CM | POA: Diagnosis not present

## 2023-07-22 DIAGNOSIS — G20C Parkinsonism, unspecified: Secondary | ICD-10-CM | POA: Diagnosis not present

## 2023-07-23 DIAGNOSIS — S0180XD Unspecified open wound of other part of head, subsequent encounter: Secondary | ICD-10-CM | POA: Diagnosis not present

## 2023-07-23 DIAGNOSIS — G20C Parkinsonism, unspecified: Secondary | ICD-10-CM | POA: Diagnosis not present

## 2023-07-23 DIAGNOSIS — Z9181 History of falling: Secondary | ICD-10-CM | POA: Diagnosis not present

## 2023-07-23 DIAGNOSIS — E785 Hyperlipidemia, unspecified: Secondary | ICD-10-CM | POA: Diagnosis not present

## 2023-07-23 DIAGNOSIS — G2581 Restless legs syndrome: Secondary | ICD-10-CM | POA: Diagnosis not present

## 2023-07-28 ENCOUNTER — Telehealth: Payer: Self-pay | Admitting: Neurology

## 2023-07-28 NOTE — Telephone Encounter (Signed)
 Pts skin biopsy came back.  There was:  evidence of alpha synuclein in the cutaneous nerves, in the posterior cervical and distal thigh biopsies  2.  No evidence of small fiber neuropathy 3.  No evidence of amyloid deposition within the cutaneous nerves  Please call pt/family and let them know that the skin biopsy did show evidence of alpha-synuclein, consistent with his diagnosis of Parkinsons disease.  There is nothing further to be done right now, and we will discuss further at his next visit.

## 2023-07-28 NOTE — Telephone Encounter (Signed)
 Called and left voice mail

## 2023-07-29 NOTE — Telephone Encounter (Signed)
 Spoke to patients wife and she said patient has had some significant decline. They are coming for appointment in mid June. She was wondering if we could send a urology referral due to patietns extreme incontinence at night time

## 2023-07-30 DIAGNOSIS — Z9181 History of falling: Secondary | ICD-10-CM | POA: Diagnosis not present

## 2023-07-30 DIAGNOSIS — G2581 Restless legs syndrome: Secondary | ICD-10-CM | POA: Diagnosis not present

## 2023-07-30 DIAGNOSIS — G20C Parkinsonism, unspecified: Secondary | ICD-10-CM | POA: Diagnosis not present

## 2023-07-30 DIAGNOSIS — S0180XD Unspecified open wound of other part of head, subsequent encounter: Secondary | ICD-10-CM | POA: Diagnosis not present

## 2023-07-30 DIAGNOSIS — E785 Hyperlipidemia, unspecified: Secondary | ICD-10-CM | POA: Diagnosis not present

## 2023-08-01 DIAGNOSIS — E7800 Pure hypercholesterolemia, unspecified: Secondary | ICD-10-CM | POA: Diagnosis not present

## 2023-08-01 DIAGNOSIS — G2581 Restless legs syndrome: Secondary | ICD-10-CM | POA: Diagnosis not present

## 2023-08-01 DIAGNOSIS — G20C Parkinsonism, unspecified: Secondary | ICD-10-CM | POA: Diagnosis not present

## 2023-08-01 DIAGNOSIS — S0180XD Unspecified open wound of other part of head, subsequent encounter: Secondary | ICD-10-CM | POA: Diagnosis not present

## 2023-08-01 DIAGNOSIS — E785 Hyperlipidemia, unspecified: Secondary | ICD-10-CM | POA: Diagnosis not present

## 2023-08-01 DIAGNOSIS — Z9181 History of falling: Secondary | ICD-10-CM | POA: Diagnosis not present

## 2023-08-01 DIAGNOSIS — I10 Essential (primary) hypertension: Secondary | ICD-10-CM | POA: Diagnosis not present

## 2023-08-05 ENCOUNTER — Encounter: Payer: Self-pay | Admitting: Neurology

## 2023-08-05 DIAGNOSIS — G2581 Restless legs syndrome: Secondary | ICD-10-CM | POA: Diagnosis not present

## 2023-08-05 DIAGNOSIS — S0180XD Unspecified open wound of other part of head, subsequent encounter: Secondary | ICD-10-CM | POA: Diagnosis not present

## 2023-08-05 DIAGNOSIS — G20C Parkinsonism, unspecified: Secondary | ICD-10-CM | POA: Diagnosis not present

## 2023-08-05 DIAGNOSIS — Z9181 History of falling: Secondary | ICD-10-CM | POA: Diagnosis not present

## 2023-08-05 DIAGNOSIS — E785 Hyperlipidemia, unspecified: Secondary | ICD-10-CM | POA: Diagnosis not present

## 2023-08-06 DIAGNOSIS — G2581 Restless legs syndrome: Secondary | ICD-10-CM | POA: Diagnosis not present

## 2023-08-06 DIAGNOSIS — Z9181 History of falling: Secondary | ICD-10-CM | POA: Diagnosis not present

## 2023-08-06 DIAGNOSIS — G20C Parkinsonism, unspecified: Secondary | ICD-10-CM | POA: Diagnosis not present

## 2023-08-06 DIAGNOSIS — E785 Hyperlipidemia, unspecified: Secondary | ICD-10-CM | POA: Diagnosis not present

## 2023-08-06 DIAGNOSIS — S0180XD Unspecified open wound of other part of head, subsequent encounter: Secondary | ICD-10-CM | POA: Diagnosis not present

## 2023-08-10 DIAGNOSIS — E785 Hyperlipidemia, unspecified: Secondary | ICD-10-CM | POA: Diagnosis not present

## 2023-08-10 DIAGNOSIS — S0180XD Unspecified open wound of other part of head, subsequent encounter: Secondary | ICD-10-CM | POA: Diagnosis not present

## 2023-08-10 DIAGNOSIS — G20C Parkinsonism, unspecified: Secondary | ICD-10-CM | POA: Diagnosis not present

## 2023-08-10 DIAGNOSIS — Z9181 History of falling: Secondary | ICD-10-CM | POA: Diagnosis not present

## 2023-08-10 DIAGNOSIS — G2581 Restless legs syndrome: Secondary | ICD-10-CM | POA: Diagnosis not present

## 2023-08-12 ENCOUNTER — Ambulatory Visit: Payer: Medicare Other | Admitting: Cardiology

## 2023-08-12 DIAGNOSIS — E785 Hyperlipidemia, unspecified: Secondary | ICD-10-CM | POA: Diagnosis not present

## 2023-08-12 DIAGNOSIS — S0180XD Unspecified open wound of other part of head, subsequent encounter: Secondary | ICD-10-CM | POA: Diagnosis not present

## 2023-08-12 DIAGNOSIS — G20C Parkinsonism, unspecified: Secondary | ICD-10-CM | POA: Diagnosis not present

## 2023-08-12 DIAGNOSIS — Z9181 History of falling: Secondary | ICD-10-CM | POA: Diagnosis not present

## 2023-08-12 DIAGNOSIS — G2581 Restless legs syndrome: Secondary | ICD-10-CM | POA: Diagnosis not present

## 2023-08-14 ENCOUNTER — Encounter: Payer: Self-pay | Admitting: Cardiology

## 2023-08-14 ENCOUNTER — Ambulatory Visit: Payer: Medicare Other | Attending: Cardiology | Admitting: Cardiology

## 2023-08-14 VITALS — BP 122/70 | HR 57 | Resp 16 | Ht 69.0 in | Wt 169.4 lb

## 2023-08-14 DIAGNOSIS — E7800 Pure hypercholesterolemia, unspecified: Secondary | ICD-10-CM | POA: Insufficient documentation

## 2023-08-14 DIAGNOSIS — I951 Orthostatic hypotension: Secondary | ICD-10-CM | POA: Diagnosis not present

## 2023-08-14 DIAGNOSIS — I10 Essential (primary) hypertension: Secondary | ICD-10-CM | POA: Insufficient documentation

## 2023-08-14 DIAGNOSIS — I6521 Occlusion and stenosis of right carotid artery: Secondary | ICD-10-CM | POA: Insufficient documentation

## 2023-08-14 NOTE — Telephone Encounter (Signed)
 Called patients wife and patient to check on referral and if patient is doing better

## 2023-08-14 NOTE — Progress Notes (Unsigned)
 Assessment/Plan:   1.  Parkinsons Disease  -DaTscan  previously fairly equivocal, but meets clinical criteria  - continue carbidopa /levodopa  25/100, 2 tablets at 8 AM/noon/4 PM.  -add carbidopa /levodopa  50/200 cr at bed to assist with first AM on  -Continue pramipexole  0.5 mg 3 times per day.  Changed timing to match levodopa , as above.  -he looks pretty well with the walker and he admits I do well with the walker.  I think that the biggest issue is that he was not previously using the walker and was falling.  - Skin biopsy April, 2025 demonstrated evidence of alpha-synuclein in the posterior cervical and distal thigh biopsies.  We reviewed the skin biopsies today.  I also looked at his skin sites.  None of them were infected, but the distal leg site had not completely healed and was still scabbed over.  We discussed proper wound healing.  If it was to become erythematous, he is to let somebody know.  2.  Left distal supraclinoid ICA aneurysm  -CTA was repeated in April, 2022 and aneurysm size was stable (small, 2mm).    3.  MCI  -Neurocognitive testing done with Dr. Annette Barters in September, 2022 demonstrated MCI.  I don't see any progression  4.  Dizziness, improved  -don't think that this is related to Parkinsons Disease or meds, as I d/c the carbidopa /levodopa  and it didn't change sx's.   -Dr. Ganji has discontinued his amlodipine  due to significant orthostasis in his office.  Has improved some.   Subjective:   Cody Dickerson was seen today in follow up for Parkinsons disease.  My previous records were reviewed prior to todays visit as well as outside records available to me. Wife accompanies the patient to the visit.  He had a skin biopsy for alpha-synuclein since last visit.  That was positive, with evidence of alpha-synuclein in the posterior cervical and distal thigh biopsies.  Last visit, changed the timing of his levodopa  so that he was taking it at 8 AM/noon/4 PM.  He is doing  this in addition to the pramipexole .  He has trouble with first morning on.  He has no hallucinations. Using walker all the time.   He saw Dr. Berry Bristol at the end of last week.  Medications were not changed.  PT/OT are coming out to the house 1 time per week.  He is using a urinal at night and his incontinence is doing better with that.    Current prescribed movement disorder medications: carbidopa /levodopa  25/100,2/2/2  Pramipexole , 0.5 mg 3 times per day   Previous meds:  lexapro  (took for a few days and felt it caused decreased energy)  ALLERGIES:   Allergies  Allergen Reactions   Escitalopram  Other (See Comments)    Felt weird and tired   Lisinopril Cough    CURRENT MEDICATIONS:  Outpatient Encounter Medications as of 08/18/2023  Medication Sig   ADVIL  200 MG CAPS Take 400 mg by mouth every 6 (six) hours as needed (for pain or headaches).   carbidopa -levodopa  (SINEMET  IR) 25-100 MG tablet Take 2 tablets by mouth 3 (three) times daily. Take 2 tab  at 8 am Take 2 tab at 12 pm Take 2 tablets at 4 pm   ezetimibe  (ZETIA ) 10 MG tablet Take 1 tablet (10 mg total) by mouth daily.   indomethacin (INDOCIN) 50 MG capsule Take 50 mg by mouth 3 (three) times daily as needed for moderate pain (gout pain).   pramipexole  (MIRAPEX ) 0.5 MG tablet Take  1 tablet (0.5 mg total) by mouth 3 (three) times daily. Take 1 tablet at 8am Take 1 tablet at 12 pm and 1 tablet at 4pm   rosuvastatin  (CRESTOR ) 20 MG tablet Take 20 mg by mouth daily.    [DISCONTINUED] diazepam  (VALIUM ) 5 MG tablet 1 tab 1 hour prior to test; may repeat 30 min prior to test prn (Patient not taking: Reported on 05/27/2023)   No facility-administered encounter medications on file as of 08/18/2023.    Objective:   PHYSICAL EXAMINATION:    VITALS:   Vitals:   08/18/23 1026  BP: (!) 142/80  Pulse: 63  SpO2: 97%  Weight: 166 lb (75.3 kg)  Height: 5' 9 (1.753 m)    GEN:  The patient appears stated age and is in NAD. HEENT:   Normocephalic, atraumatic.  the mucous membranes are moist. The superficial temporal arteries are without ropiness or tenderness. CV:  RRR Lungs:  CTAB Neck/HEME:  There are no carotid bruits bilaterally.  The neck is held in a flexed position. Skin :  some scabbing without erythema over the L distal biopsy site.  No swelling.  No drainage.  Neurological examination:  Orientation: The patient is alert and oriented x3. Cranial nerves: There is good facial symmetry with facial hypomimia. The speech is fluent and clear. Soft palate rises symmetrically and there is no tongue deviation. Hearing is intact to conversational tone. Sensation: Sensation is intact to light touch throughout Motor: Strength is at least antigravity x4.  Movement examination: Tone: There is mild increased tone in the L>R Abnormal movements: He has no tremor today Coordination:  There is mild to mod decremation, with any form of RAMS, including alternating supination and pronation of the forearm, hand opening and closing, finger taps, heel taps and toe taps bilaterally, L>R Gait and Station: The patient pushes off to arise. The patient easily arises out of the transport chair.  He has a gait belt.  He is given a walker.  He ambulates well with the walker.  He does turn en bloc.    Total time spent on today's visit was 30 minutes, including both face-to-face time and nonface-to-face time.  Time included that spent on review of records (prior notes available to me/labs/imaging if pertinent), discussing treatment and goals, answering patient's questions and coordinating care.   Cc:  Jimmey Mould, MD

## 2023-08-14 NOTE — Progress Notes (Signed)
 Cardiology Office Note:  .   Date:  08/14/2023  ID:  Cody Dickerson, DOB Oct 09, 1950, MRN [id number] PCP: Cody Mould, MD  [facility] Providers Cardiologist:  None   History of Present Illness: .   Cody Dickerson is a 73 y.o. Fairly active Caucasian male patient with supine hypertension, orthostatic hypotension due to Parkinson's disease, asymptomatic bilateral carotid artery stenosis, hypercholesterolemia normal echocardiogram on 09/21/2020 with EF 60 to 65% and a negative treadmill exercise stress test on 10/05/2020 and is asymptomatic right ICA stenosis.  Underwent carotid artery duplex and presents for follow-up.  Discussed the use of AI scribe software for clinical note transcription with the patient, who gave verbal consent to proceed.  History of Present Illness   Cody Dickerson is a 74 year old male with carotid artery stenosis who presents for cardiovascular follow-up. He has mild stenosis in the right carotid artery and minimal stenosis on the left.   He experiences generalized weakness and leg swelling. He does not have orthostatic hypotension. He finds support stockings difficult to use and spends most of his time sitting, contributing to leg swelling.  He has severe Parkinson's disease, affecting his mobility. He receives assistance from a caregiver for daily activities and uses a  wheelchair at home.      Labs   No results found for: CHOL, HDL, LDLCALC, LDLDIRECT, TRIG, CHOLHDL Lab Results  Component Value Date   NA 140 05/27/2023   K 3.8 05/27/2023   CO2 25 05/27/2023   GLUCOSE 87 05/27/2023   BUN 24 (H) 05/27/2023   CREATININE 0.93 05/27/2023   CALCIUM  9.4 05/27/2023   GFRNONAA >60 05/27/2023      Latest Ref Rng & Units 05/27/2023   10:06 AM 10/03/2021    7:17 AM  BMP  Glucose 70 - 99 mg/dL 87  88   BUN 8 - 23 mg/dL 24  15   Creatinine 7.82 - 1.24 mg/dL 9.56  2.13   Sodium 086 - 145 mmol/L 140  140   Potassium 3.5 - 5.1 mmol/L 3.8   3.6   Chloride 98 - 111 mmol/L 106  107   CO2 22 - 32 mmol/L 25  26   Calcium  8.9 - 10.3 mg/dL 9.4  9.3       Latest Ref Rng & Units 05/27/2023   10:06 AM 10/03/2021    7:17 AM  CBC  WBC 4.0 - 10.5 K/uL 5.3  5.1   Hemoglobin 13.0 - 17.0 g/dL 57.8  46.9   Hematocrit 39.0 - 52.0 % 42.1  38.9   Platelets 150 - 400 K/uL 114  134    No results found for: HGBA1C  Lab Results  Component Value Date   TSH 1.298 05/27/2023     ROS  Review of Systems  Cardiovascular:  Positive for leg swelling. Negative for chest pain and dyspnea on exertion.    Physical Exam:   VS:  There were no vitals taken for this visit.   Wt Readings from Last 3 Encounters:  06/09/23 160 lb (72.6 kg)  05/27/23 160 lb (72.6 kg)  03/06/23 166 lb (75.3 kg)    Orthostatic VS for the past 24 hrs (Last 3 readings):  BP- Sitting Pulse- Sitting BP- Standing at 0 minutes Pulse- Standing at 0 minutes  08/14/23 1016 130/72 55 121/61 59   Physical Exam Neck:     Vascular: No carotid bruit or JVD.   Cardiovascular:     Rate and Rhythm:  Normal rate and regular rhythm.     Pulses: Intact distal pulses.          Carotid pulses are  on the right side with bruit.    Heart sounds: Normal heart sounds. No murmur heard.    No gallop.  Pulmonary:     Effort: Pulmonary effort is normal.     Breath sounds: Normal breath sounds.  Abdominal:     General: Bowel sounds are normal.     Palpations: Abdomen is soft.   Musculoskeletal:     Right lower leg: Edema (1-2+ ankle) present.     Left lower leg: Edema (1-2+ ankle) present.    Studies Reviewed: .    Carotid artery duplex 02/16/2023: Right Carotid: Velocities in the right ICA are consistent with a 40-59%  stenosis. Non-hemodynamically significant plaque <50% noted in  the CCA.  Left Carotid: Velocities in the left ICA are consistent with a 1-39%  stenosis.  Non-hemodynamically significant plaque <50% noted in the CCA.  Vertebrals: Bilateral vertebral arteries  demonstrate antegrade flow.  Subclavians: Right subclavian artery flow was disturbed. Normal flow  hemodynamics were seen in the left subclavian artery.   EKG:         Medications ordered    No orders of the defined types were placed in this encounter.    ASSESSMENT AND PLAN: .      ICD-10-CM   1. Orthostatic hypotension  I95.1     2. Supine hypertension  I10     3. Hypercholesteremia  E78.00     4. Bilateral carotid artery stenosis  I65.23      Assessment and Plan    Parkinson's disease Contributing to generalized weakness and reduced mobility, leading to increased sedentary behavior and subsequent edema. Resolved previous hypertension, resulting in softer blood pressure.  Dependant Edema Likely due to prolonged sitting associated with Parkinson's disease. Diuretics not recommended due to orthostatic hypotension risk. - Recommend use of over-the-counter support stockings to manage edema. Assistance may be required to put them on.  Orthostatic hypotension Present but minimal blood pressure drop upon position change while laying down to sitting. Unable to stand due to Parkinson's. Previous hypertension resolved with Parkinson's onset.  Mild carotid artery stenosis Mild stenosis in the right carotid artery, minimal on the left. Condition possibly improved since 2022. Current lipid management with Crestor  and Zetia  is effective, with excellent cholesterol levels. No further carotid artery studies recommended due to effective lipid management. - Continue current medications: Crestor  20 mg once daily and Zetia  10 mg once daily.  -Given his advanced Parkinson's disease, no further evaluation is needed. I will see him back on a PRN basis. Discussed with wife who is in agreement.       Signed,  Knox Perl, MD, [facility] 08/14/2023, 7:11 AM [facility], [place] [address] [place], [place] [address] Phone: [phone]. Fax:  [phone]

## 2023-08-14 NOTE — Patient Instructions (Signed)

## 2023-08-16 DIAGNOSIS — I10 Essential (primary) hypertension: Secondary | ICD-10-CM | POA: Diagnosis not present

## 2023-08-17 DIAGNOSIS — Z9181 History of falling: Secondary | ICD-10-CM | POA: Diagnosis not present

## 2023-08-17 DIAGNOSIS — G20C Parkinsonism, unspecified: Secondary | ICD-10-CM | POA: Diagnosis not present

## 2023-08-17 DIAGNOSIS — G2581 Restless legs syndrome: Secondary | ICD-10-CM | POA: Diagnosis not present

## 2023-08-17 DIAGNOSIS — E785 Hyperlipidemia, unspecified: Secondary | ICD-10-CM | POA: Diagnosis not present

## 2023-08-17 DIAGNOSIS — S0180XD Unspecified open wound of other part of head, subsequent encounter: Secondary | ICD-10-CM | POA: Diagnosis not present

## 2023-08-18 ENCOUNTER — Other Ambulatory Visit: Payer: Self-pay | Admitting: Cardiology

## 2023-08-18 ENCOUNTER — Ambulatory Visit (INDEPENDENT_AMBULATORY_CARE_PROVIDER_SITE_OTHER): Payer: Medicare Other | Admitting: Neurology

## 2023-08-18 ENCOUNTER — Other Ambulatory Visit: Payer: Self-pay

## 2023-08-18 ENCOUNTER — Encounter: Payer: Self-pay | Admitting: Neurology

## 2023-08-18 DIAGNOSIS — E7800 Pure hypercholesterolemia, unspecified: Secondary | ICD-10-CM

## 2023-08-18 DIAGNOSIS — G20A1 Parkinson's disease without dyskinesia, without mention of fluctuations: Secondary | ICD-10-CM

## 2023-08-18 DIAGNOSIS — I6521 Occlusion and stenosis of right carotid artery: Secondary | ICD-10-CM

## 2023-08-18 MED ORDER — PRAMIPEXOLE DIHYDROCHLORIDE 0.5 MG PO TABS
0.5000 mg | ORAL_TABLET | Freq: Three times a day (TID) | ORAL | 1 refills | Status: DC
Start: 2023-08-18 — End: 2023-08-18

## 2023-08-18 MED ORDER — PRAMIPEXOLE DIHYDROCHLORIDE 0.5 MG PO TABS: 0.5000 mg | ORAL_TABLET | Freq: Three times a day (TID) | ORAL | 1 refills | Status: DC

## 2023-08-18 MED ORDER — CARBIDOPA-LEVODOPA ER 50-200 MG PO TBCR: 1.0000 | EXTENDED_RELEASE_TABLET | Freq: Every day | ORAL | 1 refills | Status: DC

## 2023-08-18 NOTE — Patient Instructions (Signed)
 Continue carbidopa /levodopa  25/100, 2 at 8am/noon/4pm ADD carbidopa /levodopa  50/200 ER at bed Continue pramipexole  0.5 mg three times per day as you previously were  SAVE THE DATE!  We are planning a Parkinsons Disease educational symposium at [facility] in [place] on September 19.  More details to come!  We will have a movement disorder physician expert from [employer] coming to speak and a caregiver speaker.  We will have a panel of experts that will show you who you may need on your team of people on your journey with Parkinsons.  If you would like to be added to our email list to get further information, email [email or web address].  I hope to see you there!

## 2023-08-20 DIAGNOSIS — G20C Parkinsonism, unspecified: Secondary | ICD-10-CM | POA: Diagnosis not present

## 2023-08-20 DIAGNOSIS — Z9181 History of falling: Secondary | ICD-10-CM | POA: Diagnosis not present

## 2023-08-20 DIAGNOSIS — E785 Hyperlipidemia, unspecified: Secondary | ICD-10-CM | POA: Diagnosis not present

## 2023-08-20 DIAGNOSIS — S0180XD Unspecified open wound of other part of head, subsequent encounter: Secondary | ICD-10-CM | POA: Diagnosis not present

## 2023-08-20 DIAGNOSIS — G2581 Restless legs syndrome: Secondary | ICD-10-CM | POA: Diagnosis not present

## 2023-08-21 DIAGNOSIS — Z9181 History of falling: Secondary | ICD-10-CM | POA: Diagnosis not present

## 2023-08-21 DIAGNOSIS — G20A1 Parkinson's disease without dyskinesia, without mention of fluctuations: Secondary | ICD-10-CM | POA: Diagnosis not present

## 2023-08-21 DIAGNOSIS — E785 Hyperlipidemia, unspecified: Secondary | ICD-10-CM | POA: Diagnosis not present

## 2023-08-21 DIAGNOSIS — G2581 Restless legs syndrome: Secondary | ICD-10-CM | POA: Diagnosis not present

## 2023-08-26 DIAGNOSIS — Z9181 History of falling: Secondary | ICD-10-CM | POA: Diagnosis not present

## 2023-08-26 DIAGNOSIS — G20A1 Parkinson's disease without dyskinesia, without mention of fluctuations: Secondary | ICD-10-CM | POA: Diagnosis not present

## 2023-08-26 DIAGNOSIS — E785 Hyperlipidemia, unspecified: Secondary | ICD-10-CM | POA: Diagnosis not present

## 2023-08-26 DIAGNOSIS — G2581 Restless legs syndrome: Secondary | ICD-10-CM | POA: Diagnosis not present

## 2023-08-27 ENCOUNTER — Telehealth: Payer: Self-pay | Admitting: Neurology

## 2023-08-27 DIAGNOSIS — G20A1 Parkinson's disease without dyskinesia, without mention of fluctuations: Secondary | ICD-10-CM | POA: Diagnosis not present

## 2023-08-27 DIAGNOSIS — E785 Hyperlipidemia, unspecified: Secondary | ICD-10-CM | POA: Diagnosis not present

## 2023-08-27 DIAGNOSIS — Z9181 History of falling: Secondary | ICD-10-CM | POA: Diagnosis not present

## 2023-08-27 DIAGNOSIS — G2581 Restless legs syndrome: Secondary | ICD-10-CM | POA: Diagnosis not present

## 2023-08-27 NOTE — Telephone Encounter (Signed)
 Error

## 2023-08-29 ENCOUNTER — Encounter (HOSPITAL_COMMUNITY): Payer: Self-pay | Admitting: Interventional Radiology

## 2023-09-02 DIAGNOSIS — G20A1 Parkinson's disease without dyskinesia, without mention of fluctuations: Secondary | ICD-10-CM | POA: Diagnosis not present

## 2023-09-02 DIAGNOSIS — E785 Hyperlipidemia, unspecified: Secondary | ICD-10-CM | POA: Diagnosis not present

## 2023-09-02 DIAGNOSIS — Z9181 History of falling: Secondary | ICD-10-CM | POA: Diagnosis not present

## 2023-09-02 DIAGNOSIS — G2581 Restless legs syndrome: Secondary | ICD-10-CM | POA: Diagnosis not present

## 2023-09-03 DIAGNOSIS — E785 Hyperlipidemia, unspecified: Secondary | ICD-10-CM | POA: Diagnosis not present

## 2023-09-03 DIAGNOSIS — G20A1 Parkinson's disease without dyskinesia, without mention of fluctuations: Secondary | ICD-10-CM | POA: Diagnosis not present

## 2023-09-03 DIAGNOSIS — Z9181 History of falling: Secondary | ICD-10-CM | POA: Diagnosis not present

## 2023-09-03 DIAGNOSIS — G2581 Restless legs syndrome: Secondary | ICD-10-CM | POA: Diagnosis not present

## 2023-09-09 ENCOUNTER — Telehealth: Payer: Self-pay | Admitting: Neurology

## 2023-09-09 DIAGNOSIS — G20A1 Parkinson's disease without dyskinesia, without mention of fluctuations: Secondary | ICD-10-CM | POA: Diagnosis not present

## 2023-09-09 DIAGNOSIS — E785 Hyperlipidemia, unspecified: Secondary | ICD-10-CM | POA: Diagnosis not present

## 2023-09-09 DIAGNOSIS — G2581 Restless legs syndrome: Secondary | ICD-10-CM | POA: Diagnosis not present

## 2023-09-09 DIAGNOSIS — Z9181 History of falling: Secondary | ICD-10-CM | POA: Diagnosis not present

## 2023-09-09 NOTE — Telephone Encounter (Signed)
 Pt. Wife says new Rx is not working and wants a call back

## 2023-09-09 NOTE — Telephone Encounter (Signed)
 Called patients wife and she is saying that the Carbidopa  Levodopa  is not working the 50/200 CR that was added last visit and patient is waking with horrible tremors. They want to know if they can D/C. They want to know if there is something else you want to add.

## 2023-09-10 DIAGNOSIS — Z9181 History of falling: Secondary | ICD-10-CM | POA: Diagnosis not present

## 2023-09-10 DIAGNOSIS — G2581 Restless legs syndrome: Secondary | ICD-10-CM | POA: Diagnosis not present

## 2023-09-10 DIAGNOSIS — G20A1 Parkinson's disease without dyskinesia, without mention of fluctuations: Secondary | ICD-10-CM | POA: Diagnosis not present

## 2023-09-10 DIAGNOSIS — E785 Hyperlipidemia, unspecified: Secondary | ICD-10-CM | POA: Diagnosis not present

## 2023-09-10 NOTE — Telephone Encounter (Signed)
 I am sorry not terrors  it was tremors

## 2023-09-11 ENCOUNTER — Other Ambulatory Visit: Payer: Self-pay

## 2023-09-11 NOTE — Telephone Encounter (Signed)
 Called patients wife and gave recommendation to D/C CR 50/200 at night

## 2023-09-15 DIAGNOSIS — Z9181 History of falling: Secondary | ICD-10-CM | POA: Diagnosis not present

## 2023-09-15 DIAGNOSIS — G2581 Restless legs syndrome: Secondary | ICD-10-CM | POA: Diagnosis not present

## 2023-09-15 DIAGNOSIS — E785 Hyperlipidemia, unspecified: Secondary | ICD-10-CM | POA: Diagnosis not present

## 2023-09-15 DIAGNOSIS — I10 Essential (primary) hypertension: Secondary | ICD-10-CM | POA: Diagnosis not present

## 2023-09-15 DIAGNOSIS — G20A1 Parkinson's disease without dyskinesia, without mention of fluctuations: Secondary | ICD-10-CM | POA: Diagnosis not present

## 2023-09-16 DIAGNOSIS — Z9181 History of falling: Secondary | ICD-10-CM | POA: Diagnosis not present

## 2023-09-16 DIAGNOSIS — G20A1 Parkinson's disease without dyskinesia, without mention of fluctuations: Secondary | ICD-10-CM | POA: Diagnosis not present

## 2023-09-16 DIAGNOSIS — G2581 Restless legs syndrome: Secondary | ICD-10-CM | POA: Diagnosis not present

## 2023-09-16 DIAGNOSIS — E785 Hyperlipidemia, unspecified: Secondary | ICD-10-CM | POA: Diagnosis not present

## 2023-09-20 DIAGNOSIS — E785 Hyperlipidemia, unspecified: Secondary | ICD-10-CM | POA: Diagnosis not present

## 2023-09-20 DIAGNOSIS — G20A1 Parkinson's disease without dyskinesia, without mention of fluctuations: Secondary | ICD-10-CM | POA: Diagnosis not present

## 2023-09-20 DIAGNOSIS — Z9181 History of falling: Secondary | ICD-10-CM | POA: Diagnosis not present

## 2023-09-20 DIAGNOSIS — G2581 Restless legs syndrome: Secondary | ICD-10-CM | POA: Diagnosis not present

## 2023-09-21 DIAGNOSIS — G2581 Restless legs syndrome: Secondary | ICD-10-CM | POA: Diagnosis not present

## 2023-09-21 DIAGNOSIS — G20A1 Parkinson's disease without dyskinesia, without mention of fluctuations: Secondary | ICD-10-CM | POA: Diagnosis not present

## 2023-09-21 DIAGNOSIS — Z9181 History of falling: Secondary | ICD-10-CM | POA: Diagnosis not present

## 2023-09-21 DIAGNOSIS — E785 Hyperlipidemia, unspecified: Secondary | ICD-10-CM | POA: Diagnosis not present

## 2023-09-24 DIAGNOSIS — G2581 Restless legs syndrome: Secondary | ICD-10-CM | POA: Diagnosis not present

## 2023-09-24 DIAGNOSIS — E785 Hyperlipidemia, unspecified: Secondary | ICD-10-CM | POA: Diagnosis not present

## 2023-09-24 DIAGNOSIS — Z9181 History of falling: Secondary | ICD-10-CM | POA: Diagnosis not present

## 2023-09-24 DIAGNOSIS — G20A1 Parkinson's disease without dyskinesia, without mention of fluctuations: Secondary | ICD-10-CM | POA: Diagnosis not present

## 2023-09-28 DIAGNOSIS — Z9181 History of falling: Secondary | ICD-10-CM | POA: Diagnosis not present

## 2023-09-28 DIAGNOSIS — G2581 Restless legs syndrome: Secondary | ICD-10-CM | POA: Diagnosis not present

## 2023-09-28 DIAGNOSIS — G20A1 Parkinson's disease without dyskinesia, without mention of fluctuations: Secondary | ICD-10-CM | POA: Diagnosis not present

## 2023-09-28 DIAGNOSIS — E785 Hyperlipidemia, unspecified: Secondary | ICD-10-CM | POA: Diagnosis not present

## 2023-10-01 DIAGNOSIS — Z9181 History of falling: Secondary | ICD-10-CM | POA: Diagnosis not present

## 2023-10-01 DIAGNOSIS — G20A1 Parkinson's disease without dyskinesia, without mention of fluctuations: Secondary | ICD-10-CM | POA: Diagnosis not present

## 2023-10-01 DIAGNOSIS — G2581 Restless legs syndrome: Secondary | ICD-10-CM | POA: Diagnosis not present

## 2023-10-01 DIAGNOSIS — E785 Hyperlipidemia, unspecified: Secondary | ICD-10-CM | POA: Diagnosis not present

## 2023-10-01 DIAGNOSIS — E7800 Pure hypercholesterolemia, unspecified: Secondary | ICD-10-CM | POA: Diagnosis not present

## 2023-10-01 DIAGNOSIS — I10 Essential (primary) hypertension: Secondary | ICD-10-CM | POA: Diagnosis not present

## 2023-10-05 DIAGNOSIS — E785 Hyperlipidemia, unspecified: Secondary | ICD-10-CM | POA: Diagnosis not present

## 2023-10-05 DIAGNOSIS — Z9181 History of falling: Secondary | ICD-10-CM | POA: Diagnosis not present

## 2023-10-05 DIAGNOSIS — G2581 Restless legs syndrome: Secondary | ICD-10-CM | POA: Diagnosis not present

## 2023-10-05 DIAGNOSIS — G20A1 Parkinson's disease without dyskinesia, without mention of fluctuations: Secondary | ICD-10-CM | POA: Diagnosis not present

## 2023-10-07 ENCOUNTER — Encounter: Payer: Self-pay | Admitting: Podiatry

## 2023-10-07 ENCOUNTER — Ambulatory Visit (INDEPENDENT_AMBULATORY_CARE_PROVIDER_SITE_OTHER): Admitting: Podiatry

## 2023-10-07 DIAGNOSIS — B351 Tinea unguium: Secondary | ICD-10-CM

## 2023-10-07 DIAGNOSIS — M79674 Pain in right toe(s): Secondary | ICD-10-CM

## 2023-10-07 DIAGNOSIS — M79675 Pain in left toe(s): Secondary | ICD-10-CM | POA: Diagnosis not present

## 2023-10-07 NOTE — Progress Notes (Signed)
This patient presents to the office for treatment of painful callus under the ball of his right foot.  He says this is painful walking and wearing his shoes.  He was referred to the office by his doctor since he has Parkinsons. He also requests his big toenails be trimmed. He presents to the office for evaluation and treatment.  Vascular  Dorsalis pedis and posterior tibial pulses are palpable  B/L.  Capillary return  WNL.  Temperature gradient is  WNL.  Skin turgor  WNL  Sensorium  Senn Weinstein monofilament wire  WNL. Normal tactile sensation.  Nail Exam  Patient has thick disfigured big toenails B/L.  Thick hallux nails  B/L.  Orthopedic  Exam  Muscle tone and muscle strength  WNL.  No limitations of motion feet  B/L.  No crepitus or joint effusion noted.  Foot type is unremarkable and digits show no abnormalities.  DJD 1st MPJ  right foot.  Skin  No open lesions.  Normal skin texture and turgor.  Onychomycosis  B/L   Debride  nails with nail nippers and dremel tool.   RTC 3 months.   Helane Gunther DPM

## 2023-10-15 DIAGNOSIS — E785 Hyperlipidemia, unspecified: Secondary | ICD-10-CM | POA: Diagnosis not present

## 2023-10-15 DIAGNOSIS — I10 Essential (primary) hypertension: Secondary | ICD-10-CM | POA: Diagnosis not present

## 2023-10-15 DIAGNOSIS — Z9181 History of falling: Secondary | ICD-10-CM | POA: Diagnosis not present

## 2023-10-15 DIAGNOSIS — G2581 Restless legs syndrome: Secondary | ICD-10-CM | POA: Diagnosis not present

## 2023-10-15 DIAGNOSIS — G20A1 Parkinson's disease without dyskinesia, without mention of fluctuations: Secondary | ICD-10-CM | POA: Diagnosis not present

## 2023-10-16 DIAGNOSIS — G20A1 Parkinson's disease without dyskinesia, without mention of fluctuations: Secondary | ICD-10-CM | POA: Diagnosis not present

## 2023-10-16 DIAGNOSIS — Z9181 History of falling: Secondary | ICD-10-CM | POA: Diagnosis not present

## 2023-10-16 DIAGNOSIS — E785 Hyperlipidemia, unspecified: Secondary | ICD-10-CM | POA: Diagnosis not present

## 2023-10-16 DIAGNOSIS — G2581 Restless legs syndrome: Secondary | ICD-10-CM | POA: Diagnosis not present

## 2023-10-23 ENCOUNTER — Other Ambulatory Visit: Payer: Self-pay

## 2023-10-23 DIAGNOSIS — Z7409 Other reduced mobility: Secondary | ICD-10-CM

## 2023-10-23 DIAGNOSIS — R21 Rash and other nonspecific skin eruption: Secondary | ICD-10-CM | POA: Diagnosis not present

## 2023-10-23 DIAGNOSIS — R531 Weakness: Secondary | ICD-10-CM

## 2023-10-23 DIAGNOSIS — R609 Edema, unspecified: Secondary | ICD-10-CM | POA: Diagnosis not present

## 2023-10-23 DIAGNOSIS — G20A1 Parkinson's disease without dyskinesia, without mention of fluctuations: Secondary | ICD-10-CM | POA: Diagnosis not present

## 2023-10-23 DIAGNOSIS — R2689 Other abnormalities of gait and mobility: Secondary | ICD-10-CM

## 2023-10-23 DIAGNOSIS — E7800 Pure hypercholesterolemia, unspecified: Secondary | ICD-10-CM | POA: Diagnosis not present

## 2023-10-23 DIAGNOSIS — R269 Unspecified abnormalities of gait and mobility: Secondary | ICD-10-CM

## 2023-10-23 DIAGNOSIS — Z23 Encounter for immunization: Secondary | ICD-10-CM | POA: Diagnosis not present

## 2023-10-23 DIAGNOSIS — Z6826 Body mass index (BMI) 26.0-26.9, adult: Secondary | ICD-10-CM | POA: Diagnosis not present

## 2023-10-28 ENCOUNTER — Other Ambulatory Visit: Payer: Self-pay | Admitting: Neurology

## 2023-10-28 DIAGNOSIS — G20A1 Parkinson's disease without dyskinesia, without mention of fluctuations: Secondary | ICD-10-CM

## 2023-11-01 DIAGNOSIS — I10 Essential (primary) hypertension: Secondary | ICD-10-CM | POA: Diagnosis not present

## 2023-11-01 DIAGNOSIS — E7800 Pure hypercholesterolemia, unspecified: Secondary | ICD-10-CM | POA: Diagnosis not present

## 2023-11-05 ENCOUNTER — Ambulatory Visit: Attending: Neurology | Admitting: Physical Therapy

## 2023-11-05 ENCOUNTER — Other Ambulatory Visit: Payer: Self-pay

## 2023-11-05 ENCOUNTER — Encounter: Payer: Self-pay | Admitting: Physical Therapy

## 2023-11-05 DIAGNOSIS — R2689 Other abnormalities of gait and mobility: Secondary | ICD-10-CM | POA: Diagnosis not present

## 2023-11-05 DIAGNOSIS — M6281 Muscle weakness (generalized): Secondary | ICD-10-CM | POA: Diagnosis not present

## 2023-11-05 DIAGNOSIS — Z7409 Other reduced mobility: Secondary | ICD-10-CM | POA: Insufficient documentation

## 2023-11-05 DIAGNOSIS — R269 Unspecified abnormalities of gait and mobility: Secondary | ICD-10-CM | POA: Insufficient documentation

## 2023-11-05 DIAGNOSIS — R2681 Unsteadiness on feet: Secondary | ICD-10-CM | POA: Insufficient documentation

## 2023-11-05 DIAGNOSIS — R278 Other lack of coordination: Secondary | ICD-10-CM | POA: Diagnosis not present

## 2023-11-05 DIAGNOSIS — R293 Abnormal posture: Secondary | ICD-10-CM | POA: Insufficient documentation

## 2023-11-05 DIAGNOSIS — R531 Weakness: Secondary | ICD-10-CM | POA: Insufficient documentation

## 2023-11-05 NOTE — Therapy (Signed)
 OUTPATIENT PHYSICAL THERAPY NEURO EVALUATION   Patient Name: Cody Dickerson MRN: [id number] DOB:07/09/1950, 73 y.o., male Today's Date: 11/05/2023   PCP: Okey Carlin Redbird, MD REFERRING PROVIDER: Tat, Asberry RAMAN, DO  END OF SESSION:  PT End of Session - 11/05/23 1020     Visit Number 1    Number of Visits 16    Date for PT Re-Evaluation 12/31/23    Authorization Type Medicare/AARP    PT Start Time 1017    PT Stop Time 1100    PT Time Calculation (min) 43 min    Equipment Utilized During Treatment Gait belt    Activity Tolerance Patient tolerated treatment well    Behavior During Therapy WFL for tasks assessed/performed;Impulsive          Past Medical History:  Diagnosis Date   Balance problem    Gout    Hypercholesterolemia    Hypertension    Parkinson's disease (HCC)    Small bowel obstruction (HCC)    Past Surgical History:  Procedure Laterality Date   BOWEL BLOCKAGE  05/2019   IR ANGIO INTRA EXTRACRAN SEL COM CAROTID INNOMINATE BILAT MOD SED  10/03/2021   IR ANGIO VERTEBRAL SEL VERTEBRAL UNI R MOD SED  10/03/2021   IR RADIOLOGIST EVAL & MGMT  08/19/2021   IR US  GUIDE VASC ACCESS RIGHT  10/04/2021   Patient Active Problem List   Diagnosis Date Noted   Stenosis of right carotid artery 08/14/2023   Orthostatic hypotension 08/14/2023   Supine hypertension 08/14/2023   Hypercholesteremia 08/14/2023   Pain due to onychomycosis of toenails of both feet 09/03/2022   Porokeratosis 06/04/2022   Parkinson's disease (HCC) 02/06/2021   SBO (small bowel obstruction) (HCC) 07/06/2019    ONSET DATE: 3-4 years  REFERRING DIAG: R53.1 (ICD-10-CM) - Strength loss of R26.9 (ICD-10-CM) - Gait difficulty R26.89 (ICD-10-CM) - Balance problem Z74.09 (ICD-10-CM) - Impaired functional mobility, balance, and endurance  THERAPY DIAG:  No diagnosis found.  Rationale for Evaluation and Treatment: Rehabilitation  SUBJECTIVE:                                                                                                                                                                                              SUBJECTIVE STATEMENT: Pt reports he is here for his Parkinson's. Caregiver, Ronal is present to help assist. Pt feels he is unable to keep his head up. Pt reports he has finished a bout of HHPT - worked primarily on walking and holding his head up and raising his arms. Pt reports unsteadiness with standing still. Uses a walker and wheel chair at home. Will  walk 2-3 laps around the house. Pt does report neck pain. Pt states was given arm/shoulder exercises for his neck but he wasn't good at doing them.  Pt accompanied by: Caregiver  PERTINENT HISTORY: Parkinson's, Left distal supraclinoid ICA aneurysm, mild cognitive impairment Has been doing HHPT 1x/wk  PAIN:  Are you having pain? Yes: NPRS scale: 3/10 Pain location: Base of neck Pain description: like something's in there, pinching Aggravating factors: holding head down Relieving factors: Advil   PRECAUTIONS: None  RED FLAGS: None   WEIGHT BEARING RESTRICTIONS: No  FALLS: Has patient fallen in last 6 months? Yes. Number of falls last fell out of chair ~1.5 weeks ago; 2-3 falls   LIVING ENVIRONMENT: Lives with: lives with their family and Has a caregiver present 8a-12p daily; Wife is otherwise present Lives in: House/apartment Stairs: 3 steps to enter/exit Has following equipment at home: Vannie - 2 wheeled and Wheelchair (manual)  PLOF: Needs assistance with ADLs and Needs assistance with gait  PATIENT GOALS: Improve neck posture and balance  OBJECTIVE:  Note: Objective measures were completed at Evaluation unless otherwise noted.  DIAGNOSTIC FINDINGS: n/a  COGNITION: Overall cognitive status: History of cognitive impairments - at baseline   SENSATION: WFL  COORDINATION: Tremors present throughout  EDEMA:  none  MUSCLE TONE: none noted  MUSCLE LENGTH: Did not assess  DTRs:  Did not  assess  POSTURE: rounded shoulders, forward head, and increased thoracic kyphosis  CERVICAL ROM: At rest flexed ~40 deg  Active ROM A/PROM (deg) eval  Flexion Can get chin to chest  Extension -40 to -20 deg (unable to go into full extension)  Right lateral flexion 40  Left lateral flexion 20  Right rotation 35  Left rotation 40   (Blank rows = not tested)   LOWER EXTREMITY ROM:   WNL  LOWER EXTREMITY MMT:    MMT Right Eval Left Eval  Hip flexion 5 5  Hip extension 4 4  Hip abduction 3+ 3+  Hip adduction    Hip internal rotation    Hip external rotation    Knee flexion 4- 3+  Knee extension 5 5  Ankle dorsiflexion    Ankle plantarflexion    Ankle inversion    Ankle eversion    (Blank rows = not tested)  BED MOBILITY:  Findings: Sit to supine SBA Supine to sit SBA Multiple small scooting motions  TRANSFERS: Sit to stand: Min A  Assistive device utilized: Environmental consultant - 2 wheeled     Stand to sit: CGA and Min A  Assistive device utilized: Environmental consultant - 2 wheeled     Min A for stability -- will at times require multiple attempts to come to standing  RAMP:  Did not assess  CURB:  Did not assess  STAIRS: Did not assess GAIT: Findings: Gait Characteristics: Festination at times, step through pattern, decreased step length- Right, decreased step length- Left, decreased stance time- Right, decreased stance time- Left, shuffling, trunk flexed, poor foot clearance- Right, and poor foot clearance- Left, Distance walked: ~10', and Comments: RW  FUNCTIONAL TESTS:  5 times sit to stand: 25.84 sec -- uses UEs to push off mat table, legs braced against table to stand, mild tremors/unsteadiness, requires a few attempts to obtain momentum to stand Timed up and go (TUG): 23.06 sec -- shuffling with turns, diminished step length, required 2 attempts to use momentum to stand  PATIENT SURVEYS:  PSFS: THE PATIENT SPECIFIC FUNCTIONAL SCALE  Place score of 0-10 (0 = unable to  perform  activity and 10 = able to perform activity at the same level as before injury or problem)  Activity Date: 11/05/23    Holding head up 5    2.  Standing balance  5    3.       4.      Total Score 5      Total Score = Sum of activity scores/number of activities  Minimally Detectable Change: 3 points (for single activity); 2 points (for average score)  [facility] (nd). The Patient Specific Functional Scale . Retrieved from [email or web address]                                                                                                                               TREATMENT DATE: 11/05/23 See HEP below    PATIENT EDUCATION: Education details: Exam findings, POC, initial HEP Person educated: Patient and Designer, jewellery Education method: Explanation Education comprehension: verbalized understanding, returned demonstration, and needs further education  HOME EXERCISE PROGRAM: Access Code: [email or web address] URL: [email or web address] Date: 11/05/2023 Prepared by: Champ Keetch April Earnie Starring  Exercises - Standing Isometric Cervical Retraction with Lesleigh Pontiff and Ball at [employer]  - 3 x daily - 7 x weekly - 2-3 sets - 10 reps - Seated Thoracic Lumbar Extension with Pectoralis Stretch  - 3 x daily - 7 x weekly - 2-3 sets - 10 reps  GOALS: Goals reviewed with patient? Yes  SHORT TERM GOALS: Target date: 12/03/2023   Pt will be able to perform HEP with caregiver min A Baseline: Goal status: INITIAL  2.  Pt will be able to maintain neck extension to at least within 10 deg of neutral Baseline: pt is 40 to 20 deg away from neutral Goal status: INITIAL   LONG TERM GOALS: Target date: 12/31/2023   Pt will be able to perform head/neck and postural strengthening with SBA Baseline:  Goal status: INITIAL  2.  Pt will be able to maintain neutral neck for at least 10 min for ADLs Baseline: Unable Goal status:  INITIAL  3.  Pt will report improved PSFS by >/=7 to demo MCID Baseline: 5 Goal status: INITIAL  4.  Pt will have improved 5x STS to </=20 sec to demo increased functional LE strength Baseline: 25.84 sec Goal status: INITIAL  5.  Pt will have improved TUG to </=15 sec to decrease fall risk Baseline: 23.06 sec Goal status: INITIAL    ASSESSMENT:  CLINICAL IMPRESSION: Patient is a 73 y.o. M who was seen today for physical therapy evaluation and treatment for Parkinson's. Pt's goals to improve his neck posture and standing balance. Assessment is significant for very flexed neck with poor cervicothoracic extensor strength/endurance, bilat hip/posterior chain weakness, and increased fall risk based on his TUG and 5x STS. Pt will greatly benefit from PT to improve on these deficits for increased comfort and safety with home activities.    OBJECTIVE IMPAIRMENTS: Abnormal gait, decreased activity  tolerance, decreased balance, decreased endurance, decreased mobility, difficulty walking, decreased ROM, decreased strength, decreased safety awareness, hypomobility, increased fascial restrictions, increased muscle spasms, impaired flexibility, impaired UE functional use, improper body mechanics, postural dysfunction, and pain.   ACTIVITY LIMITATIONS: carrying, lifting, bending, sitting, standing, squatting, stairs, transfers, bed mobility, bathing, toileting, dressing, reach over head, hygiene/grooming, and locomotion level  PARTICIPATION LIMITATIONS: meal prep, cleaning, laundry, medication management, driving, shopping, community activity, and yard work  PERSONAL FACTORS: Age, Fitness, Past/current experiences, and Time since onset of injury/illness/exacerbation are also affecting patient's functional outcome.   REHAB POTENTIAL: Good  CLINICAL DECISION MAKING: Evolving/moderate complexity  EVALUATION COMPLEXITY: Moderate  PLAN:  PT FREQUENCY: 2x/week  PT DURATION: 8 weeks  PLANNED  INTERVENTIONS: 97164- PT Re-evaluation, 97110-Therapeutic exercises, 97530- Therapeutic activity, 97112- Neuromuscular re-education, 97535- Self Care, [id number]- Manual therapy, [id number]- Gait training, [phone]- Aquatic Therapy, [phone]- Electrical stimulation (unattended), [phone] (1-2 muscles), 20561 (3+ muscles)- Dry Needling, Patient/Family education, Balance training, Stair training, Taping, Joint mobilization, Spinal mobilization, Cryotherapy, and Moist heat  PLAN FOR NEXT SESSION: Assess response to HEP. Work on deep neck muscle strength/endurance. Cervical/thoracic extension in gravity eliminated position to start and then slowly progress to against gravity. Posterior chain strengthening and gross balance.    Londyn Hotard April Ma L Mayanna Garlitz, PT, DPT 11/05/2023, 1:02 PM

## 2023-11-14 DIAGNOSIS — I10 Essential (primary) hypertension: Secondary | ICD-10-CM | POA: Diagnosis not present

## 2023-11-18 ENCOUNTER — Encounter: Payer: Self-pay | Admitting: Neurology

## 2023-11-19 ENCOUNTER — Telehealth: Payer: Self-pay | Admitting: Neurology

## 2023-11-19 NOTE — Telephone Encounter (Signed)
 Pt. Is requesting to get a wheelchair, please reach out to him  to discuss it. Thanks

## 2023-11-23 ENCOUNTER — Other Ambulatory Visit: Payer: Self-pay

## 2023-11-23 DIAGNOSIS — Z7409 Other reduced mobility: Secondary | ICD-10-CM

## 2023-11-23 DIAGNOSIS — R2689 Other abnormalities of gait and mobility: Secondary | ICD-10-CM

## 2023-11-23 DIAGNOSIS — G20A1 Parkinson's disease without dyskinesia, without mention of fluctuations: Secondary | ICD-10-CM

## 2023-11-23 DIAGNOSIS — R269 Unspecified abnormalities of gait and mobility: Secondary | ICD-10-CM

## 2023-11-23 DIAGNOSIS — R531 Weakness: Secondary | ICD-10-CM

## 2023-11-23 NOTE — Telephone Encounter (Signed)
 Called patient and printed DME put in Dr. Martie box to sign

## 2023-11-24 ENCOUNTER — Ambulatory Visit: Admitting: Physical Therapy

## 2023-11-24 ENCOUNTER — Encounter: Payer: Self-pay | Admitting: Physical Therapy

## 2023-11-24 DIAGNOSIS — R2689 Other abnormalities of gait and mobility: Secondary | ICD-10-CM

## 2023-11-24 DIAGNOSIS — R2681 Unsteadiness on feet: Secondary | ICD-10-CM

## 2023-11-24 DIAGNOSIS — R293 Abnormal posture: Secondary | ICD-10-CM

## 2023-11-24 DIAGNOSIS — R269 Unspecified abnormalities of gait and mobility: Secondary | ICD-10-CM | POA: Diagnosis not present

## 2023-11-24 DIAGNOSIS — Z7409 Other reduced mobility: Secondary | ICD-10-CM | POA: Diagnosis not present

## 2023-11-24 DIAGNOSIS — R278 Other lack of coordination: Secondary | ICD-10-CM

## 2023-11-24 DIAGNOSIS — M6281 Muscle weakness (generalized): Secondary | ICD-10-CM

## 2023-11-24 DIAGNOSIS — R531 Weakness: Secondary | ICD-10-CM | POA: Diagnosis not present

## 2023-11-24 NOTE — Therapy (Signed)
 OUTPATIENT PHYSICAL THERAPY NEURO EVALUATION   Patient Name: ISSAAC Dickerson MRN: [id number] DOB:09-20-1950, 73 y.o., male Today's Date: 11/24/2023   PCP: Okey Carlin Redbird, MD REFERRING PROVIDER: Evonnie Asberry RAMAN, DO  END OF SESSION:  PT End of Session - 11/24/23 0927     Visit Number 2    Number of Visits 16    Date for Recertification  12/31/23    Authorization Type Medicare/AARP    PT Start Time 0930    PT Stop Time 1010    PT Time Calculation (min) 40 min    Equipment Utilized During Treatment Gait belt    Activity Tolerance Patient tolerated treatment well    Behavior During Therapy [facility] for tasks assessed/performed;Impulsive          Past Medical History:  Diagnosis Date   Balance problem    Gout    Hypercholesterolemia    Hypertension    Parkinson's disease (HCC)    Small bowel obstruction (HCC)    Past Surgical History:  Procedure Laterality Date   BOWEL BLOCKAGE  05/2019   IR ANGIO INTRA EXTRACRAN SEL COM CAROTID INNOMINATE BILAT MOD SED  10/03/2021   IR ANGIO VERTEBRAL SEL VERTEBRAL UNI R MOD SED  10/03/2021   IR RADIOLOGIST EVAL & MGMT  08/19/2021   IR US  GUIDE VASC ACCESS RIGHT  10/04/2021   Patient Active Problem List   Diagnosis Date Noted   Stenosis of right carotid artery 08/14/2023   Orthostatic hypotension 08/14/2023   Supine hypertension 08/14/2023   Hypercholesteremia 08/14/2023   Pain due to onychomycosis of toenails of both feet 09/03/2022   Porokeratosis 06/04/2022   Parkinson's disease (HCC) 02/06/2021   SBO (small bowel obstruction) (HCC) 07/06/2019    ONSET DATE: 3-4 years  REFERRING DIAG: R53.1 (ICD-10-CM) - Strength loss of R26.9 (ICD-10-CM) - Gait difficulty R26.89 (ICD-10-CM) - Balance problem Z74.09 (ICD-10-CM) - Impaired functional mobility, balance, and endurance  THERAPY DIAG:  Unsteadiness on feet  Muscle weakness (generalized)  Other lack of coordination  Abnormal posture  Other abnormalities of gait and  mobility  Rationale for Evaluation and Treatment: Rehabilitation  SUBJECTIVE:                                                                                                                                                                                             SUBJECTIVE STATEMENT: Pt's caregiver states pt fell out of his chair a few times this past week. Pt states he fell backwards when he went over a threshold in the house.   From eval: Pt reports he is here for his Parkinson's. Caregiver, Cody Dickerson is  present to help assist. Pt feels he is unable to keep his head up. Pt reports he has finished a bout of HHPT - worked primarily on walking and holding his head up and raising his arms. Pt reports unsteadiness with standing still. Uses a walker and wheel chair at home. Will walk 2-3 laps around the house. Pt does report neck pain. Pt states was given arm/shoulder exercises for his neck but he wasn't good at doing them.  Pt accompanied by: Caregiver  PERTINENT HISTORY: Parkinson's, Left distal supraclinoid ICA aneurysm, mild cognitive impairment Has been doing HHPT 1x/wk  PAIN:  Are you having pain? Yes: NPRS scale: 3/10 Pain location: Base of neck Pain description: like something's in there, pinching Aggravating factors: holding head down Relieving factors: Advil   PRECAUTIONS: None  RED FLAGS: None   WEIGHT BEARING RESTRICTIONS: No  FALLS: Has patient fallen in last 6 months? Yes. Number of falls last fell out of chair ~1.5 weeks ago; 2-3 falls   LIVING ENVIRONMENT: Lives with: lives with their family and Has a caregiver present 8a-12p daily; Wife is otherwise present Lives in: House/apartment Stairs: 3 steps to enter/exit Has following equipment at home: Cody Dickerson - 2 wheeled and Wheelchair (manual)  PLOF: Needs assistance with ADLs and Needs assistance with gait  PATIENT GOALS: Improve neck posture and balance  OBJECTIVE:  Note: Objective measures were completed at  Evaluation unless otherwise noted.  DIAGNOSTIC FINDINGS: n/a  COGNITION: Overall cognitive status: History of cognitive impairments - at baseline  COORDINATION: Tremors present throughout  POSTURE: rounded shoulders, forward head, and increased thoracic kyphosis  CERVICAL ROM: At rest flexed ~40 deg  Active ROM A/PROM (deg) eval  Flexion Can get chin to chest  Extension -40 to -20 deg (unable to go into full extension)  Right lateral flexion 40  Left lateral flexion 20  Right rotation 35  Left rotation 40   (Blank rows = not tested)   LOWER EXTREMITY ROM:   WNL  LOWER EXTREMITY MMT:    MMT Right Eval Left Eval  Hip flexion 5 5  Hip extension 4 4  Hip abduction 3+ 3+  Hip adduction    Hip internal rotation    Hip external rotation    Knee flexion 4- 3+  Knee extension 5 5  Ankle dorsiflexion    Ankle plantarflexion    Ankle inversion    Ankle eversion    (Blank rows = not tested)  BED MOBILITY:  Findings: Sit to supine SBA Supine to sit SBA Multiple small scooting motions  TRANSFERS: Sit to stand: Min A  Assistive device utilized: Environmental consultant - 2 wheeled     Stand to sit: CGA and Min A  Assistive device utilized: Environmental consultant - 2 wheeled     Min A for stability -- will at times require multiple attempts to come to standing  GAIT: Findings: Gait Characteristics: Festination at times, step through pattern, decreased step length- Right, decreased step length- Left, decreased stance time- Right, decreased stance time- Left, shuffling, trunk flexed, poor foot clearance- Right, and poor foot clearance- Left, Distance walked: ~10', and Comments: RW  FUNCTIONAL TESTS:  5 times sit to stand: 25.84 sec -- uses UEs to push off mat table, legs braced against table to stand, mild tremors/unsteadiness, requires a few attempts to obtain momentum to stand Timed up and go (TUG): 23.06 sec -- shuffling with turns, diminished step length, required 2 attempts to use momentum to  stand  PATIENT SURVEYS:  PSFS: THE PATIENT  SPECIFIC FUNCTIONAL SCALE  Place score of 0-10 (0 = unable to perform activity and 10 = able to perform activity at the same level as before injury or problem)  Activity Date: 11/05/23    Holding head up 5    2.  Standing balance  5    3.       4.      Total Score 5      Total Score = Sum of activity scores/number of activities  Minimally Detectable Change: 3 points (for single activity); 2 points (for average score)  [facility] (nd). The Patient Specific Functional Scale . Retrieved from [email or web address]                                                                                                                               TREATMENT DATE:  11/24/23 Seated PWR!up x10 Seated thoracic/lumbar extension against chair x10 Seated cervical retraction x10 Seated PWR!twist 2x10 Seated diagonal chops red weighted ball 2x10 Standing diagonal chops red weighted ball 2x10 Standing trunk and cervical ext with UE flexion and red weighted ball 2x10 Reclined on mat table:  cervical retraction 2x10 against PT hand Lateral cervical flexion iso x10 Cervical rotation iso with cervical ext x10 Standing 1/2 tandem with UE flexion and trunk extension x10 R&L Standing feet together eyes closed UE flexion + trunk extension x10 Standing alternating heel raise 2x10 Standing marching (very challenging for pt)  11/05/23 See HEP below    PATIENT EDUCATION: Education details: Exam findings, POC, initial HEP Person educated: Patient and Designer, jewellery Education method: Explanation Education comprehension: verbalized understanding, returned demonstration, and needs further education  HOME EXERCISE PROGRAM: Access Code: [email or web address] URL: [email or web address] Date: 11/05/2023 Prepared by: Bijon Mineer April Earnie Starring  Exercises - Standing Isometric Cervical Retraction with Lesleigh Pontiff and Ball at [employer]  - 3 x daily - 7 x weekly - 2-3 sets - 10 reps - Seated Thoracic Lumbar Extension with Pectoralis Stretch  - 3 x daily - 7 x weekly - 2-3 sets - 10 reps  GOALS: Goals reviewed with patient? Yes  SHORT TERM GOALS: Target date: 12/03/2023   Pt will be able to perform HEP with caregiver min A Baseline: Goal status: INITIAL  2.  Pt will be able to maintain neck extension to at least within 10 deg of neutral Baseline: pt is 40 to 20 deg away from neutral Goal status: INITIAL   LONG TERM GOALS: Target date: 12/31/2023   Pt will be able to perform head/neck and postural strengthening with SBA Baseline:  Goal status: INITIAL  2.  Pt will be able to maintain neutral neck for at least 10 min for ADLs Baseline: Unable Goal status: INITIAL  3.  Pt will report improved PSFS by >/=7 to demo MCID Baseline: 5 Goal status: INITIAL  4.  Pt will have improved 5x STS to </=20 sec to demo increased functional LE strength Baseline: 25.84 sec Goal  status: INITIAL  5.  Pt will have improved TUG to </=15 sec to decrease fall risk Baseline: 23.06 sec Goal status: INITIAL    ASSESSMENT:  CLINICAL IMPRESSION: Reviewed HEP and modified as needed to try and improve compliance. Worked on trunk stability in sitting and standing. Continued to work on improving postural strength and increasing cervical extension and retraction  From eval: Patient is a 73 y.o. M who was seen today for physical therapy evaluation and treatment for Parkinson's. Pt's goals to improve his neck posture and standing balance. Assessment is significant for very flexed neck with poor cervicothoracic extensor strength/endurance, bilat hip/posterior chain weakness, and increased fall risk based on his TUG and 5x STS. Pt will greatly benefit from PT to improve on these deficits for increased comfort and safety with home activities.    OBJECTIVE IMPAIRMENTS: Abnormal gait, decreased activity tolerance,  decreased balance, decreased endurance, decreased mobility, difficulty walking, decreased ROM, decreased strength, decreased safety awareness, hypomobility, increased fascial restrictions, increased muscle spasms, impaired flexibility, impaired UE functional use, improper body mechanics, postural dysfunction, and pain.   ACTIVITY LIMITATIONS: carrying, lifting, bending, sitting, standing, squatting, stairs, transfers, bed mobility, bathing, toileting, dressing, reach over head, hygiene/grooming, and locomotion level  PARTICIPATION LIMITATIONS: meal prep, cleaning, laundry, medication management, driving, shopping, community activity, and yard work  PERSONAL FACTORS: Age, Fitness, Past/current experiences, and Time since onset of injury/illness/exacerbation are also affecting patient's functional outcome.   REHAB POTENTIAL: Good  CLINICAL DECISION MAKING: Evolving/moderate complexity  EVALUATION COMPLEXITY: Moderate  PLAN:  PT FREQUENCY: 2x/week  PT DURATION: 8 weeks  PLANNED INTERVENTIONS: 97164- PT Re-evaluation, 97110-Therapeutic exercises, 97530- Therapeutic activity, 97112- Neuromuscular re-education, 97535- Self Care, [id number]- Manual therapy, [id number]- Gait training, [phone]- Aquatic Therapy, [phone]- Electrical stimulation (unattended), [phone] (1-2 muscles), [id number] (3+ muscles)- Dry Needling, Patient/Family education, Balance training, Stair training, Taping, Joint mobilization, Spinal mobilization, Cryotherapy, and Moist heat  PLAN FOR NEXT SESSION: Assess response to HEP. Work on deep neck muscle strength/endurance. Cervical/thoracic extension in gravity eliminated position to start and then slowly progress to against gravity. Posterior chain strengthening and gross balance.    Emeree Mahler April Ma L Alishah Schulte, PT, DPT 11/24/2023, 9:28 AM

## 2023-11-26 ENCOUNTER — Encounter: Payer: Self-pay | Admitting: Physical Therapy

## 2023-11-26 ENCOUNTER — Ambulatory Visit: Admitting: Physical Therapy

## 2023-11-26 DIAGNOSIS — Z7409 Other reduced mobility: Secondary | ICD-10-CM | POA: Diagnosis not present

## 2023-11-26 DIAGNOSIS — M6281 Muscle weakness (generalized): Secondary | ICD-10-CM

## 2023-11-26 DIAGNOSIS — R2689 Other abnormalities of gait and mobility: Secondary | ICD-10-CM | POA: Diagnosis not present

## 2023-11-26 DIAGNOSIS — R2681 Unsteadiness on feet: Secondary | ICD-10-CM | POA: Diagnosis not present

## 2023-11-26 DIAGNOSIS — R293 Abnormal posture: Secondary | ICD-10-CM

## 2023-11-26 DIAGNOSIS — R278 Other lack of coordination: Secondary | ICD-10-CM

## 2023-11-26 DIAGNOSIS — R269 Unspecified abnormalities of gait and mobility: Secondary | ICD-10-CM | POA: Diagnosis not present

## 2023-11-26 DIAGNOSIS — R531 Weakness: Secondary | ICD-10-CM | POA: Diagnosis not present

## 2023-11-26 NOTE — Therapy (Signed)
 OUTPATIENT PHYSICAL THERAPY TREATMENT   Patient Name: Cody Dickerson MRN: [id number] DOB:[birth year], 73 y.o., male Today's Date: 11/26/2023   PCP: Okey Carlin Redbird, MD REFERRING PROVIDER: Evonnie Asberry RAMAN, DO  END OF SESSION:  PT End of Session - 11/26/23 0926     Visit Number 3    Number of Visits 16    Date for Recertification  12/31/23    Authorization Type Medicare/AARP    PT Start Time 0930    PT Stop Time 1010    PT Time Calculation (min) 40 min    Equipment Utilized During Treatment Gait belt    Activity Tolerance Patient tolerated treatment well    Behavior During Therapy [facility] for tasks assessed/performed;Impulsive           Past Medical History:  Diagnosis Date   Balance problem    Gout    Hypercholesterolemia    Hypertension    Parkinson's disease (HCC)    Small bowel obstruction (HCC)    Past Surgical History:  Procedure Laterality Date   BOWEL BLOCKAGE  05/2019   IR ANGIO INTRA EXTRACRAN SEL COM CAROTID INNOMINATE BILAT MOD SED  10/03/2021   IR ANGIO VERTEBRAL SEL VERTEBRAL UNI R MOD SED  10/03/2021   IR RADIOLOGIST EVAL & MGMT  08/19/2021   IR US  GUIDE VASC ACCESS RIGHT  10/04/2021   Patient Active Problem List   Diagnosis Date Noted   Stenosis of right carotid artery 08/14/2023   Orthostatic hypotension 08/14/2023   Supine hypertension 08/14/2023   Hypercholesteremia 08/14/2023   Pain due to onychomycosis of toenails of both feet 09/03/2022   Porokeratosis 06/04/2022   Parkinson's disease (HCC) 02/06/2021   SBO (small bowel obstruction) (HCC) 07/06/2019    ONSET DATE: 3-4 years  REFERRING DIAG: R53.1 (ICD-10-CM) - Strength loss of R26.9 (ICD-10-CM) - Gait difficulty R26.89 (ICD-10-CM) - Balance problem Z74.09 (ICD-10-CM) - Impaired functional mobility, balance, and endurance  THERAPY DIAG:  Unsteadiness on feet  Muscle weakness (generalized)  Other lack of coordination  Abnormal posture  Other abnormalities of gait and  mobility  Rationale for Evaluation and Treatment: Rehabilitation  SUBJECTIVE:                                                                                                                                                                                             SUBJECTIVE STATEMENT: No falls since last session. Reports he has been compliant to his HEP.   From eval: Pt reports he is here for his Parkinson's. Caregiver, Ronal is present to help assist. Pt feels he is unable to keep his head up. Pt reports  he has finished a bout of HHPT - worked primarily on walking and holding his head up and raising his arms. Pt reports unsteadiness with standing still. Uses a walker and wheel chair at home. Will walk 2-3 laps around the house. Pt does report neck pain. Pt states was given arm/shoulder exercises for his neck but he wasn't good at doing them.  Pt accompanied by: Caregiver  PERTINENT HISTORY: Parkinson's, Left distal supraclinoid ICA aneurysm, mild cognitive impairment Has been doing HHPT 1x/wk  PAIN:  Are you having pain? Yes: NPRS scale: 3/10 Pain location: Base of neck Pain description: like something's in there, pinching Aggravating factors: holding head down Relieving factors: Advil   PRECAUTIONS: None  RED FLAGS: None   WEIGHT BEARING RESTRICTIONS: No  FALLS: Has patient fallen in last 6 months? Yes. Number of falls last fell out of chair ~1.5 weeks ago; 2-3 falls   LIVING ENVIRONMENT: Lives with: lives with their family and Has a caregiver present 8a-12p daily; Wife is otherwise present Lives in: House/apartment Stairs: 3 steps to enter/exit Has following equipment at home: Vannie - 2 wheeled and Wheelchair (manual)  PLOF: Needs assistance with ADLs and Needs assistance with gait  PATIENT GOALS: Improve neck posture and balance  OBJECTIVE:  Note: Objective measures were completed at Evaluation unless otherwise noted.  DIAGNOSTIC FINDINGS: n/a  COGNITION: Overall  cognitive status: History of cognitive impairments - at baseline  COORDINATION: Tremors present throughout  POSTURE: rounded shoulders, forward head, and increased thoracic kyphosis  CERVICAL ROM: At rest flexed ~40 deg  Active ROM A/PROM (deg) eval  Flexion Can get chin to chest  Extension -40 to -20 deg (unable to go into full extension)  Right lateral flexion 40  Left lateral flexion 20  Right rotation 35  Left rotation 40   (Blank rows = not tested)   LOWER EXTREMITY ROM:   WNL  LOWER EXTREMITY MMT:    MMT Right Eval Left Eval  Hip flexion 5 5  Hip extension 4 4  Hip abduction 3+ 3+  Hip adduction    Hip internal rotation    Hip external rotation    Knee flexion 4- 3+  Knee extension 5 5  Ankle dorsiflexion    Ankle plantarflexion    Ankle inversion    Ankle eversion    (Blank rows = not tested)  BED MOBILITY:  Findings: Sit to supine SBA Supine to sit SBA Multiple small scooting motions  TRANSFERS: Sit to stand: Min A  Assistive device utilized: Environmental consultant - 2 wheeled     Stand to sit: CGA and Min A  Assistive device utilized: Environmental consultant - 2 wheeled     Min A for stability -- will at times require multiple attempts to come to standing  GAIT: Findings: Gait Characteristics: Festination at times, step through pattern, decreased step length- Right, decreased step length- Left, decreased stance time- Right, decreased stance time- Left, shuffling, trunk flexed, poor foot clearance- Right, and poor foot clearance- Left, Distance walked: ~10', and Comments: RW  FUNCTIONAL TESTS:  5 times sit to stand: 25.84 sec -- uses UEs to push off mat table, legs braced against table to stand, mild tremors/unsteadiness, requires a few attempts to obtain momentum to stand Timed up and go (TUG): 23.06 sec -- shuffling with turns, diminished step length, required 2 attempts to use momentum to stand  PATIENT SURVEYS:  PSFS: THE PATIENT SPECIFIC FUNCTIONAL SCALE  Place score of 0-10  (0 = unable to perform activity and 10 =  able to perform activity at the same level as before injury or problem)  Activity Date: 11/05/23    Holding head up 5    2.  Standing balance  5    3.       4.      Total Score 5      Total Score = Sum of activity scores/number of activities  Minimally Detectable Change: 3 points (for single activity); 2 points (for average score)  [facility] (nd). The Patient Specific Functional Scale . Retrieved from [email or web address]                                                                                                                               TREATMENT DATE:  11/26/23 Activity Comment  Seated thoracic/lumbar ext against chair x10 Seated cervical retraction passive x10 Seated cervical retraction 5x5 Seated diagonals with red weighted ball x10 Seated V red weighted ball x10   Standing diagonal chops red weighted ball x10 Standing V red weighted ball x10 Standing shoulder ext red TB x10 Standing palloff press red TB x10 Standing alternating heel raise 2x10 Standing hip abduction 2x10 Standing marching 2x10           PATIENT EDUCATION: Education details: Exam findings, POC, initial HEP Person educated: Patient and Designer, jewellery Education method: Explanation Education comprehension: verbalized understanding, returned demonstration, and needs further education  HOME EXERCISE PROGRAM: Access Code: [email or web address] URL: [email or web address] Date: 11/26/2023 Prepared by: Richad Ramsay April Earnie Starring  Exercises - Seated Thoracic Lumbar Extension with Pectoralis Stretch  - 3 x daily - 7 x weekly - 2-3 sets - 10 reps - Seated Cervical Retraction Extension Passive Loaded  - 3 x daily - 7 x weekly - 2-3 sets - 10 reps - Seated Cervical Retraction  - 1 x daily - 7 x weekly - 2 sets - 5 reps - 5 sec hold - Seated Diagonal Chops with Medicine Ball  - 1 x daily - 7 x  weekly - 2 sets - 10 reps  GOALS: Goals reviewed with patient? Yes  SHORT TERM GOALS: Target date: 12/03/2023   Pt will be able to perform HEP with caregiver min A Baseline: Goal status: INITIAL  2.  Pt will be able to maintain neck extension to at least within 10 deg of neutral Baseline: pt is 40 to 20 deg away from neutral Goal status: INITIAL   LONG TERM GOALS: Target date: 12/31/2023   Pt will be able to perform head/neck and postural strengthening with SBA Baseline:  Goal status: INITIAL  2.  Pt will be able to maintain neutral neck for at least 10 min for ADLs Baseline: Unable Goal status: INITIAL  3.  Pt will report improved PSFS by >/=7 to demo MCID Baseline: 5 Goal status: INITIAL  4.  Pt will have improved 5x STS to </=20 sec to demo increased functional LE strength Baseline: 25.84 sec Goal status: INITIAL  5.  Pt will have improved TUG to </=15 sec to decrease fall risk Baseline: 23.06 sec Goal status: INITIAL    ASSESSMENT:  CLINICAL IMPRESSION: Pt demos good HEP compliance and carry over from last session. Improving cervical retraction - pt able to hold for 5 sec intervals today. Continued to work on postural strengthening to improve thoracic and cervical extension. Worked in standing to improve balance and weight shift. Requires min A for safety with standing exercises.   From eval: Patient is a 73 y.o. M who was seen today for physical therapy evaluation and treatment for Parkinson's. Pt's goals to improve his neck posture and standing balance. Assessment is significant for very flexed neck with poor cervicothoracic extensor strength/endurance, bilat hip/posterior chain weakness, and increased fall risk based on his TUG and 5x STS. Pt will greatly benefit from PT to improve on these deficits for increased comfort and safety with home activities.    OBJECTIVE IMPAIRMENTS: Abnormal gait, decreased activity tolerance, decreased balance, decreased endurance,  decreased mobility, difficulty walking, decreased ROM, decreased strength, decreased safety awareness, hypomobility, increased fascial restrictions, increased muscle spasms, impaired flexibility, impaired UE functional use, improper body mechanics, postural dysfunction, and pain.   ACTIVITY LIMITATIONS: carrying, lifting, bending, sitting, standing, squatting, stairs, transfers, bed mobility, bathing, toileting, dressing, reach over head, hygiene/grooming, and locomotion level  PARTICIPATION LIMITATIONS: meal prep, cleaning, laundry, medication management, driving, shopping, community activity, and yard work  PERSONAL FACTORS: Age, Fitness, Past/current experiences, and Time since onset of injury/illness/exacerbation are also affecting patient's functional outcome.   REHAB POTENTIAL: Good  CLINICAL DECISION MAKING: Evolving/moderate complexity  EVALUATION COMPLEXITY: Moderate  PLAN:  PT FREQUENCY: 2x/week  PT DURATION: 8 weeks  PLANNED INTERVENTIONS: 97164- PT Re-evaluation, 97110-Therapeutic exercises, 97530- Therapeutic activity, 97112- Neuromuscular re-education, 97535- Self Care, 02859- Manual therapy, [id number]- Gait training, [phone]- Aquatic Therapy, [phone]- Electrical stimulation (unattended), [phone] (1-2 muscles), 20561 (3+ muscles)- Dry Needling, Patient/Family education, Balance training, Stair training, Taping, Joint mobilization, Spinal mobilization, Cryotherapy, and Moist heat  PLAN FOR NEXT SESSION: Assess response to HEP. Work on deep neck muscle strength/endurance. Cervical/thoracic extension in gravity eliminated position to start and then slowly progress to against gravity. Posterior chain strengthening and gross balance.    Melanie Openshaw April Ma L Miriam Kestler, PT, DPT 11/26/2023, 9:27 AM

## 2023-11-30 NOTE — Therapy (Signed)
 OUTPATIENT PHYSICAL THERAPY TREATMENT   Patient Name: Cody Dickerson MRN: [id number] DOB:1951/02/22, 73 y.o., male Today's Date: 12/01/2023   PCP: Okey Carlin Redbird, MD REFERRING PROVIDER: Evonnie Asberry RAMAN, DO  END OF SESSION:  PT End of Session - 12/01/23 1015     Visit Number 4    Number of Visits 16    Date for Recertification  12/31/23    Authorization Type Medicare/AARP    PT Start Time 0932    PT Stop Time 1014    PT Time Calculation (min) 42 min    Equipment Utilized During Treatment Gait belt    Activity Tolerance Patient tolerated treatment well    Behavior During Therapy WFL for tasks assessed/performed;Impulsive            Past Medical History:  Diagnosis Date   Balance problem    Gout    Hypercholesterolemia    Hypertension    Parkinson's disease (HCC)    Small bowel obstruction (HCC)    Past Surgical History:  Procedure Laterality Date   BOWEL BLOCKAGE  05/2019   IR ANGIO INTRA EXTRACRAN SEL COM CAROTID INNOMINATE BILAT MOD SED  10/03/2021   IR ANGIO VERTEBRAL SEL VERTEBRAL UNI R MOD SED  10/03/2021   IR RADIOLOGIST EVAL & MGMT  08/19/2021   IR US  GUIDE VASC ACCESS RIGHT  10/04/2021   Patient Active Problem List   Diagnosis Date Noted   Stenosis of right carotid artery 08/14/2023   Orthostatic hypotension 08/14/2023   Supine hypertension 08/14/2023   Hypercholesteremia 08/14/2023   Pain due to onychomycosis of toenails of both feet 09/03/2022   Porokeratosis 06/04/2022   Parkinson's disease (HCC) 02/06/2021   SBO (small bowel obstruction) (HCC) 07/06/2019    ONSET DATE: 3-4 years  REFERRING DIAG: R53.1 (ICD-10-CM) - Strength loss of R26.9 (ICD-10-CM) - Gait difficulty R26.89 (ICD-10-CM) - Balance problem Z74.09 (ICD-10-CM) - Impaired functional mobility, balance, and endurance  THERAPY DIAG:  Unsteadiness on feet  Muscle weakness (generalized)  Other lack of coordination  Abnormal posture  Other abnormalities of gait and  mobility  Rationale for Evaluation and Treatment: Rehabilitation  SUBJECTIVE:                                                                                                                                                                                             SUBJECTIVE STATEMENT: Caregiver reports that pt had a fall on Thursday out of a w/c. Has a cut on the R forearm. Pt and caregiver report compliance with HEP.    Pt accompanied by: Caregiver  PERTINENT HISTORY: Parkinson's, Left distal supraclinoid ICA aneurysm,  mild cognitive impairment Has been doing HHPT 1x/wk  PAIN:  Are you having pain? Yes: NPRS scale: no/10 Pain location: Base of neck Pain description: like something's in there, pinching Aggravating factors: holding head down Relieving factors: Advil   PRECAUTIONS: None  RED FLAGS: None   WEIGHT BEARING RESTRICTIONS: No  FALLS: Has patient fallen in last 6 months? Yes. Number of falls last fell out of chair ~1.5 weeks ago; 2-3 falls   LIVING ENVIRONMENT: Lives with: lives with their family and Has a caregiver present 8a-12p daily; Wife is otherwise present Lives in: House/apartment Stairs: 3 steps to enter/exit Has following equipment at home: Vannie - 2 wheeled and Wheelchair (manual)  PLOF: Needs assistance with ADLs and Needs assistance with gait  PATIENT GOALS: Improve neck posture and balance  OBJECTIVE:      TODAY'S TREATMENT: 12/01/23 Activity Comments  Neck position at rest  17 degrees flexion. Able to get to actively get to neutral neck posture   sitting cervical extension/retraction into red TB 10x3 Cueing for proper form  standing red TB row 2x10 At walker. Visual target to maintain head lifted   standing red TB shoulder extension  2x10 At walker. Visual target to maintain head lifted   STS with medball Discontinued- unable   STS  With heady cueing for set up, reach down to stand up with CGA-min A  Sitting repeated trunk flexion  to cone on floor To assist with STS  alt backwards steps Focus on wide BOS; heavy UE reliance   backwards walking  Along TM rail; cues for eyes on target, larger steps   gait with RW out to lobby x32ft CGA for safety          Note: Objective measures were completed at Evaluation unless otherwise noted.  DIAGNOSTIC FINDINGS: n/a  COGNITION: Overall cognitive status: History of cognitive impairments - at baseline  COORDINATION: Tremors present throughout  POSTURE: rounded shoulders, forward head, and increased thoracic kyphosis  CERVICAL ROM: At rest flexed ~40 deg  Active ROM A/PROM (deg) eval  Flexion Can get chin to chest  Extension -40 to -20 deg (unable to go into full extension)  Right lateral flexion 40  Left lateral flexion 20  Right rotation 35  Left rotation 40   (Blank rows = not tested)   LOWER EXTREMITY ROM:   WNL  LOWER EXTREMITY MMT:    MMT Right Eval Left Eval  Hip flexion 5 5  Hip extension 4 4  Hip abduction 3+ 3+  Hip adduction    Hip internal rotation    Hip external rotation    Knee flexion 4- 3+  Knee extension 5 5  Ankle dorsiflexion    Ankle plantarflexion    Ankle inversion    Ankle eversion    (Blank rows = not tested)  BED MOBILITY:  Findings: Sit to supine SBA Supine to sit SBA Multiple small scooting motions  TRANSFERS: Sit to stand: Min A  Assistive device utilized: Environmental consultant - 2 wheeled     Stand to sit: CGA and Min A  Assistive device utilized: Environmental consultant - 2 wheeled     Min A for stability -- will at times require multiple attempts to come to standing  GAIT: Findings: Gait Characteristics: Festination at times, step through pattern, decreased step length- Right, decreased step length- Left, decreased stance time- Right, decreased stance time- Left, shuffling, trunk flexed, poor foot clearance- Right, and poor foot clearance- Left, Distance walked: ~10', and Comments: RW  FUNCTIONAL  TESTS:  5 times sit to stand: 25.84 sec  -- uses UEs to push off mat table, legs braced against table to stand, mild tremors/unsteadiness, requires a few attempts to obtain momentum to stand Timed up and go (TUG): 23.06 sec -- shuffling with turns, diminished step length, required 2 attempts to use momentum to stand  PATIENT SURVEYS:  PSFS: THE PATIENT SPECIFIC FUNCTIONAL SCALE  Place score of 0-10 (0 = unable to perform activity and 10 = able to perform activity at the same level as before injury or problem)  Activity Date: 11/05/23    Holding head up 5    2.  Standing balance  5    3.       4.      Total Score 5      Total Score = Sum of activity scores/number of activities  Minimally Detectable Change: 3 points (for single activity); 2 points (for average score)  [facility] (nd). The Patient Specific Functional Scale . Retrieved from [email or web address]                                                                                                                                PATIENT EDUCATION: Education details: Exam findings, POC, initial HEP Person educated: Patient and Nurse, mental health method: Explanation Education comprehension: verbalized understanding, returned demonstration, and needs further education  HOME EXERCISE PROGRAM: Access Code: [email or web address] URL: [email or web address] Date: 11/26/2023 Prepared by: Gellen April Earnie Starring  Exercises - Seated Thoracic Lumbar Extension with Pectoralis Stretch  - 3 x daily - 7 x weekly - 2-3 sets - 10 reps - Seated Cervical Retraction Extension Passive Loaded  - 3 x daily - 7 x weekly - 2-3 sets - 10 reps - Seated Cervical Retraction  - 1 x daily - 7 x weekly - 2 sets - 5 reps - 5 sec hold - Seated Diagonal Chops with Medicine Ball  - 1 x daily - 7 x weekly - 2 sets - 10 reps  GOALS: Goals reviewed with patient? Yes  SHORT TERM GOALS: Target date: 12/03/2023   Pt will be  able to perform HEP with caregiver min A Baseline: Goal status: MET 12/01/23  2.  Pt will be able to maintain neck extension to at least within 10 deg of neutral Baseline: pt is 40 to 20 deg away from neutral; 17 degrees flexion. Able to get to actively get to neutral neck posture 9.30.25 Goal status: NOT MET 12/01/23   LONG TERM GOALS: Target date: 12/31/2023   Pt will be able to perform head/neck and postural strengthening with SBA Baseline:  Goal status: INITIAL  2.  Pt will be able to maintain neutral neck for at least 10 min for ADLs Baseline: Unable Goal status: INITIAL  3.  Pt will report improved PSFS by >/=7 to demo MCID Baseline: 5 Goal status: INITIAL  4.  Pt will have improved 5x STS to </=20  sec to demo increased functional LE strength Baseline: 25.84 sec Goal status: INITIAL  5.  Pt will have improved TUG to </=15 sec to decrease fall risk Baseline: 23.06 sec Goal status: INITIAL    ASSESSMENT:  CLINICAL IMPRESSION: Patient arrived to session with caregiver who both report pt having a fall out of the w/c on Thursday, possibly resulting in abrasions on the R forearm. Patient has progressed towards STGs but they have not been fully met. Standing postural strengthening activities were performed for added balance challenge and used visual targets to assist with upright positioning. Patient demonstrated good standing balance. More difficulty evident with STS when trialing without UE support. Increased practice necessary for technique. No complaints at end of session.   OBJECTIVE IMPAIRMENTS: Abnormal gait, decreased activity tolerance, decreased balance, decreased endurance, decreased mobility, difficulty walking, decreased ROM, decreased strength, decreased safety awareness, hypomobility, increased fascial restrictions, increased muscle spasms, impaired flexibility, impaired UE functional use, improper body mechanics, postural dysfunction, and pain.   ACTIVITY  LIMITATIONS: carrying, lifting, bending, sitting, standing, squatting, stairs, transfers, bed mobility, bathing, toileting, dressing, reach over head, hygiene/grooming, and locomotion level  PARTICIPATION LIMITATIONS: meal prep, cleaning, laundry, medication management, driving, shopping, community activity, and yard work  PERSONAL FACTORS: Age, Fitness, Past/current experiences, and Time since onset of injury/illness/exacerbation are also affecting patient's functional outcome.   REHAB POTENTIAL: Good  CLINICAL DECISION MAKING: Evolving/moderate complexity  EVALUATION COMPLEXITY: Moderate  PLAN:  PT FREQUENCY: 2x/week  PT DURATION: 8 weeks  PLANNED INTERVENTIONS: 97164- PT Re-evaluation, 97110-Therapeutic exercises, 97530- Therapeutic activity, 97112- Neuromuscular re-education, 97535- Self Care, 02859- Manual therapy, [id number]- Gait training, [phone]- Aquatic Therapy, [phone]- Electrical stimulation (unattended), [phone] (1-2 muscles), 20561 (3+ muscles)- Dry Needling, Patient/Family education, Balance training, Stair training, Taping, Joint mobilization, Spinal mobilization, Cryotherapy, and Moist heat  PLAN FOR NEXT SESSION: STS without UE technique. Work on deep neck muscle strength/endurance. Cervical/thoracic extension in gravity eliminated position to start and then slowly progress to against gravity. Posterior chain strengthening and gross balance.    Louana Terrilyn Christians, PT, DPT 12/01/23 10:17 AM  [facility] [address] [place], [address] [place], [place] [address] Phone # [phone] Fax # [phone]

## 2023-12-01 ENCOUNTER — Ambulatory Visit: Admitting: Physical Therapy

## 2023-12-01 ENCOUNTER — Encounter: Payer: Self-pay | Admitting: Physical Therapy

## 2023-12-01 DIAGNOSIS — E7800 Pure hypercholesterolemia, unspecified: Secondary | ICD-10-CM | POA: Diagnosis not present

## 2023-12-01 DIAGNOSIS — M6281 Muscle weakness (generalized): Secondary | ICD-10-CM

## 2023-12-01 DIAGNOSIS — R2689 Other abnormalities of gait and mobility: Secondary | ICD-10-CM | POA: Diagnosis not present

## 2023-12-01 DIAGNOSIS — R2681 Unsteadiness on feet: Secondary | ICD-10-CM

## 2023-12-01 DIAGNOSIS — R278 Other lack of coordination: Secondary | ICD-10-CM

## 2023-12-01 DIAGNOSIS — Z7409 Other reduced mobility: Secondary | ICD-10-CM | POA: Diagnosis not present

## 2023-12-01 DIAGNOSIS — R269 Unspecified abnormalities of gait and mobility: Secondary | ICD-10-CM | POA: Diagnosis not present

## 2023-12-01 DIAGNOSIS — R531 Weakness: Secondary | ICD-10-CM | POA: Diagnosis not present

## 2023-12-01 DIAGNOSIS — I10 Essential (primary) hypertension: Secondary | ICD-10-CM | POA: Diagnosis not present

## 2023-12-01 DIAGNOSIS — R293 Abnormal posture: Secondary | ICD-10-CM

## 2023-12-02 ENCOUNTER — Telehealth: Payer: Self-pay | Admitting: Neurology

## 2023-12-02 NOTE — Telephone Encounter (Signed)
 Pt wants to know have the prescription been in put in  for the wheelchair. Please call. Thanks

## 2023-12-03 ENCOUNTER — Ambulatory Visit: Attending: Neurology

## 2023-12-03 DIAGNOSIS — R2689 Other abnormalities of gait and mobility: Secondary | ICD-10-CM | POA: Diagnosis not present

## 2023-12-03 DIAGNOSIS — R2681 Unsteadiness on feet: Secondary | ICD-10-CM | POA: Insufficient documentation

## 2023-12-03 DIAGNOSIS — M6281 Muscle weakness (generalized): Secondary | ICD-10-CM | POA: Insufficient documentation

## 2023-12-03 DIAGNOSIS — R269 Unspecified abnormalities of gait and mobility: Secondary | ICD-10-CM | POA: Diagnosis not present

## 2023-12-03 DIAGNOSIS — R293 Abnormal posture: Secondary | ICD-10-CM | POA: Diagnosis not present

## 2023-12-03 NOTE — Telephone Encounter (Signed)
 Called pateint and wheelchair order sent on 11/23/23 through [facility]

## 2023-12-03 NOTE — Therapy (Signed)
 OUTPATIENT PHYSICAL THERAPY TREATMENT   Patient Name: Cody Dickerson MRN: [id number] DOB:April 06, 1950, 73 y.o., male Today's Date: 12/03/2023   PCP: Okey Carlin Redbird, MD REFERRING PROVIDER: Tat, Asberry RAMAN, DO  END OF SESSION:  PT End of Session - 12/03/23 0930     Visit Number 5    Number of Visits 16    Date for Recertification  12/31/23    Authorization Type Medicare/AARP    PT Start Time 0930    PT Stop Time 1015    PT Time Calculation (min) 45 min    Equipment Utilized During Treatment Gait belt    Activity Tolerance Patient tolerated treatment well    Behavior During Therapy [facility] for tasks assessed/performed;Impulsive            Past Medical History:  Diagnosis Date   Balance problem    Gout    Hypercholesterolemia    Hypertension    Parkinson's disease (HCC)    Small bowel obstruction (HCC)    Past Surgical History:  Procedure Laterality Date   BOWEL BLOCKAGE  05/2019   IR ANGIO INTRA EXTRACRAN SEL COM CAROTID INNOMINATE BILAT MOD SED  10/03/2021   IR ANGIO VERTEBRAL SEL VERTEBRAL UNI R MOD SED  10/03/2021   IR RADIOLOGIST EVAL & MGMT  08/19/2021   IR US  GUIDE VASC ACCESS RIGHT  10/04/2021   Patient Active Problem List   Diagnosis Date Noted   Stenosis of right carotid artery 08/14/2023   Orthostatic hypotension 08/14/2023   Supine hypertension 08/14/2023   Hypercholesteremia 08/14/2023   Pain due to onychomycosis of toenails of both feet 09/03/2022   Porokeratosis 06/04/2022   Parkinson's disease (HCC) 02/06/2021   SBO (small bowel obstruction) (HCC) 07/06/2019    ONSET DATE: 3-4 years  REFERRING DIAG: R53.1 (ICD-10-CM) - Strength loss of R26.9 (ICD-10-CM) - Gait difficulty R26.89 (ICD-10-CM) - Balance problem Z74.09 (ICD-10-CM) - Impaired functional mobility, balance, and endurance  THERAPY DIAG:  Unsteadiness on feet  Muscle weakness (generalized)  Abnormal posture  Other abnormalities of gait and mobility  Rationale for Evaluation and  Treatment: Rehabilitation  SUBJECTIVE:                                                                                                                                                                                             SUBJECTIVE STATEMENT: Feel like there is a kink in the neck.    Pt accompanied by: Caregiver  PERTINENT HISTORY: Parkinson's, Left distal supraclinoid ICA aneurysm, mild cognitive impairment Has been doing HHPT 1x/wk  PAIN:  Are you having pain? Yes: NPRS scale: no/10 Pain location: Base of  neck Pain description: like something's in there, pinching Aggravating factors: holding head down Relieving factors: Advil   PRECAUTIONS: None  RED FLAGS: None   WEIGHT BEARING RESTRICTIONS: No  FALLS: Has patient fallen in last 6 months? Yes. Number of falls last fell out of chair ~1.5 weeks ago; 2-3 falls   LIVING ENVIRONMENT: Lives with: lives with their family and Has a caregiver present 8a-12p daily; Wife is otherwise present Lives in: House/apartment Stairs: 3 steps to enter/exit Has following equipment at home: Vannie - 2 wheeled and Wheelchair (manual)  PLOF: Needs assistance with ADLs and Needs assistance with gait  PATIENT GOALS: Improve neck posture and balance  OBJECTIVE:   TODAY'S TREATMENT: 12/03/23 Activity Comments  Neuro re-ed -Supine postural re-education techniques, contract-relax for cervical extension. Trials with BP cuff for biofeedback with difficulty coordinating. Manual facilitation for cervical extension/lordosis. Proprioceptive training for neutral with multi-direction contract-relax and passive to AAROM chin retraction -seated extension SNAG w/ towel  Gait training 2 min rounds: 3x85 ft w/ RW SBA-CGA -- 4 rounds completed  Postural strength Seated rows 3x10 35# Seated lat row 3x10 35#                TODAY'S TREATMENT: 12/01/23 Activity Comments  Neck position at rest  17 degrees flexion. Able to get to actively get to  neutral neck posture   sitting cervical extension/retraction into red TB 10x3 Cueing for proper form  standing red TB row 2x10 At walker. Visual target to maintain head lifted   standing red TB shoulder extension  2x10 At walker. Visual target to maintain head lifted   STS with medball Discontinued- unable   STS  With heady cueing for set up, reach down to stand up with CGA-min A  Sitting repeated trunk flexion to cone on floor To assist with STS  alt backwards steps Focus on wide BOS; heavy UE reliance   backwards walking  Along TM rail; cues for eyes on target, larger steps   gait with RW out to lobby x5ft CGA for safety    PATIENT EDUCATION: Education details: Exam findings, POC, initial HEP Person educated: Patient and Designer, jewellery Education method: Explanation Education comprehension: verbalized understanding, returned demonstration, and needs further education  HOME EXERCISE PROGRAM: Access Code: [email or web address] URL: [email or web address] Date: 11/26/2023 Prepared by: Gellen April Earnie Starring  Exercises - Seated Thoracic Lumbar Extension with Pectoralis Stretch  - 3 x daily - 7 x weekly - 2-3 sets - 10 reps - Seated Cervical Retraction Extension Passive Loaded  - 3 x daily - 7 x weekly - 2-3 sets - 10 reps - Seated Cervical Retraction  - 1 x daily - 7 x weekly - 2 sets - 5 reps - 5 sec hold - Seated Diagonal Chops with Medicine Ball  - 1 x daily - 7 x weekly - 2 sets - 10 reps - Supine Isometric Neck Extension  - 1 x daily - 7 x weekly - 3 sets - 10 reps - 2 sec hold      Note: Objective measures were completed at Evaluation unless otherwise noted.  DIAGNOSTIC FINDINGS: n/a  COGNITION: Overall cognitive status: History of cognitive impairments - at baseline  COORDINATION: Tremors present throughout  POSTURE: rounded shoulders, forward head, and increased thoracic kyphosis  CERVICAL ROM: At rest flexed ~40 deg  Active ROM A/PROM (deg) eval   Flexion Can get chin to chest  Extension -40 to -20 deg (unable to go into full extension)  Right lateral flexion 40  Left lateral flexion 20  Right rotation 35  Left rotation 40   (Blank rows = not tested)   LOWER EXTREMITY ROM:   WNL  LOWER EXTREMITY MMT:    MMT Right Eval Left Eval  Hip flexion 5 5  Hip extension 4 4  Hip abduction 3+ 3+  Hip adduction    Hip internal rotation    Hip external rotation    Knee flexion 4- 3+  Knee extension 5 5  Ankle dorsiflexion    Ankle plantarflexion    Ankle inversion    Ankle eversion    (Blank rows = not tested)  BED MOBILITY:  Findings: Sit to supine SBA Supine to sit SBA Multiple small scooting motions  TRANSFERS: Sit to stand: Min A  Assistive device utilized: Environmental consultant - 2 wheeled     Stand to sit: CGA and Min A  Assistive device utilized: Environmental consultant - 2 wheeled     Min A for stability -- will at times require multiple attempts to come to standing  GAIT: Findings: Gait Characteristics: Festination at times, step through pattern, decreased step length- Right, decreased step length- Left, decreased stance time- Right, decreased stance time- Left, shuffling, trunk flexed, poor foot clearance- Right, and poor foot clearance- Left, Distance walked: ~10', and Comments: RW  FUNCTIONAL TESTS:  5 times sit to stand: 25.84 sec -- uses UEs to push off mat table, legs braced against table to stand, mild tremors/unsteadiness, requires a few attempts to obtain momentum to stand Timed up and go (TUG): 23.06 sec -- shuffling with turns, diminished step length, required 2 attempts to use momentum to stand  PATIENT SURVEYS:  PSFS: THE PATIENT SPECIFIC FUNCTIONAL SCALE  Place score of 0-10 (0 = unable to perform activity and 10 = able to perform activity at the same level as before injury or problem)  Activity Date: 11/05/23    Holding head up 5    2.  Standing balance  5    3.       4.      Total Score 5      Total Score = Sum of  activity scores/number of activities  Minimally Detectable Change: 3 points (for single activity); 2 points (for average score)  [facility] (nd). The Patient Specific Functional Scale . Retrieved from [email or web address]                                                                                                                                  GOALS: Goals reviewed with patient? Yes  SHORT TERM GOALS: Target date: 12/03/2023   Pt will be able to perform HEP with caregiver min A Baseline: Goal status: MET 12/01/23  2.  Pt will be able to maintain neck extension to at least within 10 deg of neutral Baseline: pt is 40 to 20 deg away from neutral; 17 degrees flexion. Able to get to actively get to neutral  neck posture 9.30.25 Goal status: NOT MET 12/01/23   LONG TERM GOALS: Target date: 12/31/2023   Pt will be able to perform head/neck and postural strengthening with SBA Baseline:  Goal status: INITIAL  2.  Pt will be able to maintain neutral neck for at least 10 min for ADLs Baseline: Unable Goal status: INITIAL  3.  Pt will report improved PSFS by >/=7 to demo MCID Baseline: 5 Goal status: INITIAL  4.  Pt will have improved 5x STS to </=20 sec to demo increased functional LE strength Baseline: 25.84 sec Goal status: INITIAL  5.  Pt will have improved TUG to </=15 sec to decrease fall risk Baseline: 23.06 sec Goal status: INITIAL    ASSESSMENT:  CLINICAL IMPRESSION: Focus on postural-reeducation to facilitate recruitment of cervical extensors performed in supine and requiring extensive manual contact for facilitation progressing to active-assisted with repeat trials. Postural strength in sitting to increase posterior chain endurance interspersed with fast-pace walking rounds for interval activity. Continued sessions to progress POC details to improve mobility and reduce risk for falls and improve  postural control  OBJECTIVE IMPAIRMENTS: Abnormal gait, decreased activity tolerance, decreased balance, decreased endurance, decreased mobility, difficulty walking, decreased ROM, decreased strength, decreased safety awareness, hypomobility, increased fascial restrictions, increased muscle spasms, impaired flexibility, impaired UE functional use, improper body mechanics, postural dysfunction, and pain.   ACTIVITY LIMITATIONS: carrying, lifting, bending, sitting, standing, squatting, stairs, transfers, bed mobility, bathing, toileting, dressing, reach over head, hygiene/grooming, and locomotion level  PARTICIPATION LIMITATIONS: meal prep, cleaning, laundry, medication management, driving, shopping, community activity, and yard work  PERSONAL FACTORS: Age, Fitness, Past/current experiences, and Time since onset of injury/illness/exacerbation are also affecting patient's functional outcome.   REHAB POTENTIAL: Good  CLINICAL DECISION MAKING: Evolving/moderate complexity  EVALUATION COMPLEXITY: Moderate  PLAN:  PT FREQUENCY: 2x/week  PT DURATION: 8 weeks  PLANNED INTERVENTIONS: 97164- PT Re-evaluation, 97110-Therapeutic exercises, 97530- Therapeutic activity, 97112- Neuromuscular re-education, 97535- Self Care, 02859- Manual therapy, [id number]- Gait training, [id number]- Aquatic Therapy, [phone]- Electrical stimulation (unattended), [phone] (1-2 muscles), 20561 (3+ muscles)- Dry Needling, Patient/Family education, Balance training, Stair training, Taping, Joint mobilization, Spinal mobilization, Cryotherapy, and Moist heat  PLAN FOR NEXT SESSION: STS without UE technique. Work on deep neck muscle strength/endurance. Cervical/thoracic extension in gravity eliminated position to start and then slowly progress to against gravity. Posterior chain strengthening and gross balance.    10:19 AM, 12/03/23 M. Kelly Apryl Brymer, PT, DPT Physical Therapist- [place] Office Number: [phone]

## 2023-12-03 NOTE — Telephone Encounter (Signed)
 Called patient and informed him that Mitzie has placed a order for a wheelchair. Patient wanted to know if it is going to come to him? I informed patient that I will send Mitzie a message to see where order was sent. Patient verbalized understanding and had no further question or concerns.

## 2023-12-07 NOTE — Therapy (Signed)
 OUTPATIENT PHYSICAL THERAPY TREATMENT   Patient Name: Cody Dickerson MRN: [id number] DOB:04/20/1950, 73 y.o., male Today's Date: 12/08/2023   PCP: Okey Carlin Redbird, MD REFERRING PROVIDER: Evonnie Asberry RAMAN, DO  END OF SESSION:  PT End of Session - 12/08/23 1014     Visit Number 6    Number of Visits 16    Date for Recertification  12/31/23    Authorization Type Medicare/AARP    PT Start Time 0927    PT Stop Time 1012    PT Time Calculation (min) 45 min    Equipment Utilized During Treatment Gait belt    Activity Tolerance Patient tolerated treatment well    Behavior During Therapy [facility] for tasks assessed/performed;Impulsive             Past Medical History:  Diagnosis Date   Balance problem    Gout    Hypercholesterolemia    Hypertension    Parkinson's disease (HCC)    Small bowel obstruction (HCC)    Past Surgical History:  Procedure Laterality Date   BOWEL BLOCKAGE  05/2019   IR ANGIO INTRA EXTRACRAN SEL COM CAROTID INNOMINATE BILAT MOD SED  10/03/2021   IR ANGIO VERTEBRAL SEL VERTEBRAL UNI R MOD SED  10/03/2021   IR RADIOLOGIST EVAL & MGMT  08/19/2021   IR US  GUIDE VASC ACCESS RIGHT  10/04/2021   Patient Active Problem List   Diagnosis Date Noted   Stenosis of right carotid artery 08/14/2023   Orthostatic hypotension 08/14/2023   Supine hypertension 08/14/2023   Hypercholesteremia 08/14/2023   Pain due to onychomycosis of toenails of both feet 09/03/2022   Porokeratosis 06/04/2022   Parkinson's disease (HCC) 02/06/2021   SBO (small bowel obstruction) (HCC) 07/06/2019    ONSET DATE: 3-4 years  REFERRING DIAG: R53.1 (ICD-10-CM) - Strength loss of R26.9 (ICD-10-CM) - Gait difficulty R26.89 (ICD-10-CM) - Balance problem Z74.09 (ICD-10-CM) - Impaired functional mobility, balance, and endurance  THERAPY DIAG:  Unsteadiness on feet  Muscle weakness (generalized)  Abnormal posture  Other abnormalities of gait and mobility  Rationale for Evaluation and  Treatment: Rehabilitation  SUBJECTIVE:                                                                                                                                                                                             SUBJECTIVE STATEMENT: Not sure how he cut his L shin. Declines bandaid. Reports performing his HEP.    Pt accompanied by: Caregiver  PERTINENT HISTORY: Parkinson's, Left distal supraclinoid ICA aneurysm, mild cognitive impairment Has been doing HHPT 1x/wk  PAIN:  Are you having pain? Yes: NPRS  scale: 3/10 Pain location: Base of neck Pain description: like something's in there, pinching Aggravating factors: holding head down Relieving factors: Advil   PRECAUTIONS: None  RED FLAGS: None   WEIGHT BEARING RESTRICTIONS: No  FALLS: Has patient fallen in last 6 months? Yes. Number of falls last fell out of chair ~1.5 weeks ago; 2-3 falls   LIVING ENVIRONMENT: Lives with: lives with their family and Has a caregiver present 8a-12p daily; Wife is otherwise present Lives in: House/apartment Stairs: 3 steps to enter/exit Has following equipment at home: Vannie - 2 wheeled and Wheelchair (manual)  PLOF: Needs assistance with ADLs and Needs assistance with gait  PATIENT GOALS: Improve neck posture and balance  OBJECTIVE:     TODAY'S TREATMENT: 12/08/23 Activity Comments  Nustep HIIT at various resistance UEs/LEs x8 min Dynamic warm up, neural priming. Pt requires cues to reset posture occasionally by keeping eyes on target. Great amplitude and speed   STS without UEs  Review of set up and emphasize reach down to stand up as well as resetting posture upon standing up and sitting down. Pt requires CGA for safety  Short distance gait with RW between activities in clinic  CGA-min A d/t festination with turns   standing reaching/looking to multidirectional cards on mirror Requires cueing to step and reach and encouraged fewer larger steps. Required occasional  min-mod A d/t posterior LOB. Noted some neck pain   Supine with manual resistance into cervical extension isometrics 5x5   Gentle STM to B SCM Significant taut bands of muscle. Edu on influence of tightess on posture     PATIENT EDUCATION: Education details: discussed going to [facility] for cardio on Nustep  Person educated: Patient and Caregiver   Education method: Explanation Education comprehension: verbalized understanding    HOME EXERCISE PROGRAM: Access Code: [email or web address] URL: [email or web address] Date: 11/26/2023 Prepared by: Gellen April Earnie Starring  Exercises - Seated Thoracic Lumbar Extension with Pectoralis Stretch  - 3 x daily - 7 x weekly - 2-3 sets - 10 reps - Seated Cervical Retraction Extension Passive Loaded  - 3 x daily - 7 x weekly - 2-3 sets - 10 reps - Seated Cervical Retraction  - 1 x daily - 7 x weekly - 2 sets - 5 reps - 5 sec hold - Seated Diagonal Chops with Medicine Ball  - 1 x daily - 7 x weekly - 2 sets - 10 reps - Supine Isometric Neck Extension  - 1 x daily - 7 x weekly - 3 sets - 10 reps - 2 sec hold      Note: Objective measures were completed at Evaluation unless otherwise noted.  DIAGNOSTIC FINDINGS: n/a  COGNITION: Overall cognitive status: History of cognitive impairments - at baseline  COORDINATION: Tremors present throughout  POSTURE: rounded shoulders, forward head, and increased thoracic kyphosis  CERVICAL ROM: At rest flexed ~40 deg  Active ROM A/PROM (deg) eval  Flexion Can get chin to chest  Extension -40 to -20 deg (unable to go into full extension)  Right lateral flexion 40  Left lateral flexion 20  Right rotation 35  Left rotation 40   (Blank rows = not tested)   LOWER EXTREMITY ROM:   WNL  LOWER EXTREMITY MMT:    MMT Right Eval Left Eval  Hip flexion 5 5  Hip extension 4 4  Hip abduction 3+ 3+  Hip adduction    Hip internal rotation    Hip external rotation    Knee flexion 4- 3+  Knee  extension  5 5  Ankle dorsiflexion    Ankle plantarflexion    Ankle inversion    Ankle eversion    (Blank rows = not tested)  BED MOBILITY:  Findings: Sit to supine SBA Supine to sit SBA Multiple small scooting motions  TRANSFERS: Sit to stand: Min A  Assistive device utilized: Environmental consultant - 2 wheeled     Stand to sit: CGA and Min A  Assistive device utilized: Environmental consultant - 2 wheeled     Min A for stability -- will at times require multiple attempts to come to standing  GAIT: Findings: Gait Characteristics: Festination at times, step through pattern, decreased step length- Right, decreased step length- Left, decreased stance time- Right, decreased stance time- Left, shuffling, trunk flexed, poor foot clearance- Right, and poor foot clearance- Left, Distance walked: ~10', and Comments: RW  FUNCTIONAL TESTS:  5 times sit to stand: 25.84 sec -- uses UEs to push off mat table, legs braced against table to stand, mild tremors/unsteadiness, requires a few attempts to obtain momentum to stand Timed up and go (TUG): 23.06 sec -- shuffling with turns, diminished step length, required 2 attempts to use momentum to stand  PATIENT SURVEYS:  PSFS: THE PATIENT SPECIFIC FUNCTIONAL SCALE  Place score of 0-10 (0 = unable to perform activity and 10 = able to perform activity at the same level as before injury or problem)  Activity Date: 11/05/23    Holding head up 5    2.  Standing balance  5    3.       4.      Total Score 5      Total Score = Sum of activity scores/number of activities  Minimally Detectable Change: 3 points (for single activity); 2 points (for average score)  [facility] (nd). The Patient Specific Functional Scale . Retrieved from [email or web address]                                                                                                                                  GOALS: Goals reviewed with patient? Yes  SHORT  TERM GOALS: Target date: 12/03/2023   Pt will be able to perform HEP with caregiver min A Baseline: Goal status: MET 12/01/23  2.  Pt will be able to maintain neck extension to at least within 10 deg of neutral Baseline: pt is 40 to 20 deg away from neutral; 17 degrees flexion. Able to get to actively get to neutral neck posture 9.30.25 Goal status: NOT MET 12/01/23   LONG TERM GOALS: Target date: 12/31/2023   Pt will be able to perform head/neck and postural strengthening with SBA Baseline:  Goal status: INITIAL  2.  Pt will be able to maintain neutral neck for at least 10 min for ADLs Baseline: Unable Goal status: INITIAL  3.  Pt will report improved PSFS by >/=7 to demo MCID Baseline: 5 Goal status: INITIAL  4.  Pt will have improved 5x STS to </=20 sec to demo increased functional LE strength Baseline: 25.84 sec Goal status: INITIAL  5.  Pt will have improved TUG to </=15 sec to decrease fall risk Baseline: 23.06 sec Goal status: INITIAL    ASSESSMENT:  CLINICAL IMPRESSION: Patient arrived to session without new complaints. Worked on STS transfers with cueing for proper set up and technique. Patient demonstrates good carryover of cueing provided.  Worked on stepping strategy activities with visual tracking and requiring neck ROM- patient with occasional need for physical assist to recover posterior LOB. Patient with palpable taut bands of muscle in B SCM, which is contributing to flex cervical posture.   OBJECTIVE IMPAIRMENTS: Abnormal gait, decreased activity tolerance, decreased balance, decreased endurance, decreased mobility, difficulty walking, decreased ROM, decreased strength, decreased safety awareness, hypomobility, increased fascial restrictions, increased muscle spasms, impaired flexibility, impaired UE functional use, improper body mechanics, postural dysfunction, and pain.   ACTIVITY LIMITATIONS: carrying, lifting, bending, sitting, standing, squatting,  stairs, transfers, bed mobility, bathing, toileting, dressing, reach over head, hygiene/grooming, and locomotion level  PARTICIPATION LIMITATIONS: meal prep, cleaning, laundry, medication management, driving, shopping, community activity, and yard work  PERSONAL FACTORS: Age, Fitness, Past/current experiences, and Time since onset of injury/illness/exacerbation are also affecting patient's functional outcome.   REHAB POTENTIAL: Good  CLINICAL DECISION MAKING: Evolving/moderate complexity  EVALUATION COMPLEXITY: Moderate  PLAN:  PT FREQUENCY: 2x/week  PT DURATION: 8 weeks  PLANNED INTERVENTIONS: 97164- PT Re-evaluation, 97110-Therapeutic exercises, 97530- Therapeutic activity, 97112- Neuromuscular re-education, 97535- Self Care, 02859- Manual therapy, [id number]- Gait training, [phone]- Aquatic Therapy, [phone]- Electrical stimulation (unattended), [phone] (1-2 muscles), [id number] (3+ muscles)- Dry Needling, Patient/Family education, Balance training, Stair training, Taping, Joint mobilization, Spinal mobilization, Cryotherapy, and Moist heat  PLAN FOR NEXT SESSION: STS without UE technique. Work on deep neck muscle strength/endurance. Cervical/thoracic extension in gravity eliminated position to start and then slowly progress to against gravity. Posterior chain strengthening and gross balance.    Louana Terrilyn Christians, PT, DPT 12/08/23 10:16 AM  [facility] at [facility] [address] [place], [address] [place], [place] [address] Phone # [phone] Fax # [phone]

## 2023-12-08 ENCOUNTER — Ambulatory Visit: Admitting: Physical Therapy

## 2023-12-08 ENCOUNTER — Encounter: Payer: Self-pay | Admitting: Physical Therapy

## 2023-12-08 DIAGNOSIS — M6281 Muscle weakness (generalized): Secondary | ICD-10-CM

## 2023-12-08 DIAGNOSIS — R269 Unspecified abnormalities of gait and mobility: Secondary | ICD-10-CM | POA: Diagnosis not present

## 2023-12-08 DIAGNOSIS — R293 Abnormal posture: Secondary | ICD-10-CM | POA: Diagnosis not present

## 2023-12-08 DIAGNOSIS — R2689 Other abnormalities of gait and mobility: Secondary | ICD-10-CM | POA: Diagnosis not present

## 2023-12-08 DIAGNOSIS — R2681 Unsteadiness on feet: Secondary | ICD-10-CM

## 2023-12-10 ENCOUNTER — Ambulatory Visit: Admitting: Physical Therapy

## 2023-12-10 ENCOUNTER — Encounter: Payer: Self-pay | Admitting: Physical Therapy

## 2023-12-10 DIAGNOSIS — R293 Abnormal posture: Secondary | ICD-10-CM | POA: Diagnosis not present

## 2023-12-10 DIAGNOSIS — R2681 Unsteadiness on feet: Secondary | ICD-10-CM | POA: Diagnosis not present

## 2023-12-10 DIAGNOSIS — M6281 Muscle weakness (generalized): Secondary | ICD-10-CM | POA: Diagnosis not present

## 2023-12-10 DIAGNOSIS — R2689 Other abnormalities of gait and mobility: Secondary | ICD-10-CM | POA: Diagnosis not present

## 2023-12-10 DIAGNOSIS — R269 Unspecified abnormalities of gait and mobility: Secondary | ICD-10-CM | POA: Diagnosis not present

## 2023-12-10 NOTE — Therapy (Signed)
 OUTPATIENT PHYSICAL THERAPY TREATMENT   Patient Name: Cody Dickerson MRN: [id number] DOB:June 01, 1950, 73 y.o., male Today's Date: 12/10/2023   PCP: Okey Carlin Redbird, MD REFERRING PROVIDER: Evonnie Asberry RAMAN, DO  END OF SESSION:  PT End of Session - 12/10/23 0937     Visit Number 7    Number of Visits 16    Date for Recertification  12/31/23    Authorization Type Medicare/AARP    Progress Note Due on Visit 10    PT Start Time 0935    PT Stop Time 1015    PT Time Calculation (min) 40 min    Equipment Utilized During Treatment Gait belt    Activity Tolerance Patient tolerated treatment well    Behavior During Therapy [facility] for tasks assessed/performed;Impulsive              Past Medical History:  Diagnosis Date   Balance problem    Gout    Hypercholesterolemia    Hypertension    Parkinson's disease (HCC)    Small bowel obstruction (HCC)    Past Surgical History:  Procedure Laterality Date   BOWEL BLOCKAGE  05/2019   IR ANGIO INTRA EXTRACRAN SEL COM CAROTID INNOMINATE BILAT MOD SED  10/03/2021   IR ANGIO VERTEBRAL SEL VERTEBRAL UNI R MOD SED  10/03/2021   IR RADIOLOGIST EVAL & MGMT  08/19/2021   IR US  GUIDE VASC ACCESS RIGHT  10/04/2021   Patient Active Problem List   Diagnosis Date Noted   Stenosis of right carotid artery 08/14/2023   Orthostatic hypotension 08/14/2023   Supine hypertension 08/14/2023   Hypercholesteremia 08/14/2023   Pain due to onychomycosis of toenails of both feet 09/03/2022   Porokeratosis 06/04/2022   Parkinson's disease (HCC) 02/06/2021   SBO (small bowel obstruction) (HCC) 07/06/2019    ONSET DATE: 3-4 years  REFERRING DIAG: R53.1 (ICD-10-CM) - Strength loss of R26.9 (ICD-10-CM) - Gait difficulty R26.89 (ICD-10-CM) - Balance problem Z74.09 (ICD-10-CM) - Impaired functional mobility, balance, and endurance  THERAPY DIAG:  Unsteadiness on feet  Muscle weakness (generalized)  Abnormal posture  Other abnormalities of gait and  mobility  Rationale for Evaluation and Treatment: Rehabilitation  SUBJECTIVE:                                                                                                                                                                                             SUBJECTIVE STATEMENT: Nothing new.   Pt accompanied by: Caregiver  PERTINENT HISTORY: Parkinson's, Left distal supraclinoid ICA aneurysm, mild cognitive impairment Has been doing HHPT 1x/wk  PAIN:  Are you having pain? Yes: NPRS scale: 3/10 Pain  location: Base of neck Pain description: like something's in there, pinching Aggravating factors: holding head down Relieving factors: Advil   PRECAUTIONS: None  RED FLAGS: None   WEIGHT BEARING RESTRICTIONS: No  FALLS: Has patient fallen in last 6 months? Yes. Number of falls last fell out of chair ~1.5 weeks ago; 2-3 falls   LIVING ENVIRONMENT: Lives with: lives with their family and Has a caregiver present 8a-12p daily; Wife is otherwise present Lives in: House/apartment Stairs: 3 steps to enter/exit Has following equipment at home: Vannie - 2 wheeled and Wheelchair (manual)  PLOF: Needs assistance with ADLs and Needs assistance with gait  PATIENT GOALS: Improve neck posture and balance  OBJECTIVE:    TODAY'S TREATMENT: 12/10/2023 Activity Comments  Warm up exercise with cervical retraction PWR! Moves and hands behind head   Seated in w/c neck retraction against ball/aerobic block at wall, 3 x 5 reps, 5 sec For postural strengthening  Wall pushups x 10 Standing, PT min guard  Modified quadruped PWR! Moves at elevated mat 5-10 reps cues for posture, eye boost  Gait with RW-short distance with turns 20 ft x 8 reps, cues for marching/sidestepping turns  Standing at RW:  marching in place x 10 Forward/back stepping x 10, with cues to look up with forward step Side step and weightshift x 10 Min assist to prevent posterior LOB      PATIENT  EDUCATION: Education details: continue current HEP Person educated: Patient and Caregiver   Education method: Explanation Education comprehension: verbalized understanding    HOME EXERCISE PROGRAM: Access Code: [email or web address] URL: [email or web address] Date: 11/26/2023 Prepared by: Gellen April Earnie Starring  Exercises - Seated Thoracic Lumbar Extension with Pectoralis Stretch  - 3 x daily - 7 x weekly - 2-3 sets - 10 reps - Seated Cervical Retraction Extension Passive Loaded  - 3 x daily - 7 x weekly - 2-3 sets - 10 reps - Seated Cervical Retraction  - 1 x daily - 7 x weekly - 2 sets - 5 reps - 5 sec hold - Seated Diagonal Chops with Medicine Ball  - 1 x daily - 7 x weekly - 2 sets - 10 reps - Supine Isometric Neck Extension  - 1 x daily - 7 x weekly - 3 sets - 10 reps - 2 sec hold      Note: Objective measures were completed at Evaluation unless otherwise noted.  DIAGNOSTIC FINDINGS: n/a  COGNITION: Overall cognitive status: History of cognitive impairments - at baseline  COORDINATION: Tremors present throughout  POSTURE: rounded shoulders, forward head, and increased thoracic kyphosis  CERVICAL ROM: At rest flexed ~40 deg  Active ROM A/PROM (deg) eval  Flexion Can get chin to chest  Extension -40 to -20 deg (unable to go into full extension)  Right lateral flexion 40  Left lateral flexion 20  Right rotation 35  Left rotation 40   (Blank rows = not tested)   LOWER EXTREMITY ROM:   WNL  LOWER EXTREMITY MMT:    MMT Right Eval Left Eval  Hip flexion 5 5  Hip extension 4 4  Hip abduction 3+ 3+  Hip adduction    Hip internal rotation    Hip external rotation    Knee flexion 4- 3+  Knee extension 5 5  Ankle dorsiflexion    Ankle plantarflexion    Ankle inversion    Ankle eversion    (Blank rows = not tested)  BED MOBILITY:  Findings: Sit to supine SBA Supine to sit SBA  Multiple small scooting motions  TRANSFERS: Sit to stand: Min A   Assistive device utilized: Environmental consultant - 2 wheeled     Stand to sit: CGA and Min A  Assistive device utilized: Environmental consultant - 2 wheeled     Min A for stability -- will at times require multiple attempts to come to standing  GAIT: Findings: Gait Characteristics: Festination at times, step through pattern, decreased step length- Right, decreased step length- Left, decreased stance time- Right, decreased stance time- Left, shuffling, trunk flexed, poor foot clearance- Right, and poor foot clearance- Left, Distance walked: ~10', and Comments: RW  FUNCTIONAL TESTS:  5 times sit to stand: 25.84 sec -- uses UEs to push off mat table, legs braced against table to stand, mild tremors/unsteadiness, requires a few attempts to obtain momentum to stand Timed up and go (TUG): 23.06 sec -- shuffling with turns, diminished step length, required 2 attempts to use momentum to stand  PATIENT SURVEYS:  PSFS: THE PATIENT SPECIFIC FUNCTIONAL SCALE  Place score of 0-10 (0 = unable to perform activity and 10 = able to perform activity at the same level as before injury or problem)  Activity Date: 11/05/23    Holding head up 5    2.  Standing balance  5    3.       4.      Total Score 5      Total Score = Sum of activity scores/number of activities  Minimally Detectable Change: 3 points (for single activity); 2 points (for average score)  [facility] (nd). The Patient Specific Functional Scale . Retrieved from [email or web address]                                                                                                                                  GOALS: Goals reviewed with patient? Yes  SHORT TERM GOALS: Target date: 12/03/2023   Pt will be able to perform HEP with caregiver min A Baseline: Goal status: MET 12/01/23  2.  Pt will be able to maintain neck extension to at least within 10 deg of neutral Baseline: pt is 40 to 20 deg away  from neutral; 17 degrees flexion. Able to get to actively get to neutral neck posture 9.30.25 Goal status: NOT MET 12/01/23   LONG TERM GOALS: Target date: 12/31/2023   Pt will be able to perform head/neck and postural strengthening with SBA Baseline:  Goal status: INITIAL  2.  Pt will be able to maintain neutral neck for at least 10 min for ADLs Baseline: Unable Goal status: INITIAL  3.  Pt will report improved PSFS by >/=7 to demo MCID Baseline: 5 Goal status: INITIAL  4.  Pt will have improved 5x STS to </=20 sec to demo increased functional LE strength Baseline: 25.84 sec Goal status: INITIAL  5.  Pt will have improved TUG to </=15 sec to decrease fall risk Baseline: 23.06 sec Goal  status: INITIAL    ASSESSMENT:  CLINICAL IMPRESSION: Pt presents today without new complaints. Skilled PT session focused on postural exercises, PWR! Moves exercises for posture, balance, weightshifting, stepping, as well as balance exercises, all incorporating looking at targets to reset posture. Pt needs cues throughout to reset posture.  Worked on short distance gait with turns, with some stutter steps with turns; worked to address with standing balance activities for improved SLS.  He will likely need additional work with improved turns.  Pt will continue to benefit from skilled PT towards goals for improved functional mobility and decreased fall risk.  OBJECTIVE IMPAIRMENTS: Abnormal gait, decreased activity tolerance, decreased balance, decreased endurance, decreased mobility, difficulty walking, decreased ROM, decreased strength, decreased safety awareness, hypomobility, increased fascial restrictions, increased muscle spasms, impaired flexibility, impaired UE functional use, improper body mechanics, postural dysfunction, and pain.   ACTIVITY LIMITATIONS: carrying, lifting, bending, sitting, standing, squatting, stairs, transfers, bed mobility, bathing, toileting, dressing, reach over head,  hygiene/grooming, and locomotion level  PARTICIPATION LIMITATIONS: meal prep, cleaning, laundry, medication management, driving, shopping, community activity, and yard work  PERSONAL FACTORS: Age, Fitness, Past/current experiences, and Time since onset of injury/illness/exacerbation are also affecting patient's functional outcome.   REHAB POTENTIAL: Good  CLINICAL DECISION MAKING: Evolving/moderate complexity  EVALUATION COMPLEXITY: Moderate  PLAN:  PT FREQUENCY: 2x/week  PT DURATION: 8 weeks  PLANNED INTERVENTIONS: 97164- PT Re-evaluation, 97110-Therapeutic exercises, 97530- Therapeutic activity, 97112- Neuromuscular re-education, 97535- Self Care, 02859- Manual therapy, [id number]- Gait training, [phone]- Aquatic Therapy, [phone]- Electrical stimulation (unattended), [phone] (1-2 muscles), 20561 (3+ muscles)- Dry Needling, Patient/Family education, Balance training, Stair training, Taping, Joint mobilization, Spinal mobilization, Cryotherapy, and Moist heat  PLAN FOR NEXT SESSION: Continue STS without UE technique. Work on deep neck muscle strength/endurance. Cervical/thoracic extension in gravity eliminated position to start and then slowly progress to against gravity. Posterior chain strengthening and gross balance.    Greig Anon, PT 12/10/23 1:52 PM Phone: [phone] Fax: [phone]  [facility] at [facility] [address] [place], [address] [place], [place] [address] Phone # [phone] Fax # [phone]

## 2023-12-14 DIAGNOSIS — I10 Essential (primary) hypertension: Secondary | ICD-10-CM | POA: Diagnosis not present

## 2023-12-15 ENCOUNTER — Ambulatory Visit: Admitting: Physical Therapy

## 2023-12-15 ENCOUNTER — Encounter: Payer: Self-pay | Admitting: Physical Therapy

## 2023-12-15 DIAGNOSIS — M6281 Muscle weakness (generalized): Secondary | ICD-10-CM

## 2023-12-15 DIAGNOSIS — R2689 Other abnormalities of gait and mobility: Secondary | ICD-10-CM

## 2023-12-15 DIAGNOSIS — R269 Unspecified abnormalities of gait and mobility: Secondary | ICD-10-CM | POA: Diagnosis not present

## 2023-12-15 DIAGNOSIS — R2681 Unsteadiness on feet: Secondary | ICD-10-CM

## 2023-12-15 DIAGNOSIS — R293 Abnormal posture: Secondary | ICD-10-CM | POA: Diagnosis not present

## 2023-12-15 NOTE — Therapy (Signed)
 OUTPATIENT PHYSICAL THERAPY TREATMENT   Patient Name: Cody Dickerson MRN: [id number] DOB:[birth year], 73 y.o., male Today's Date: 12/15/2023   PCP: Okey Carlin Redbird, MD REFERRING PROVIDER: Evonnie Asberry RAMAN, DO  END OF SESSION:  PT End of Session - 12/15/23 0936     Visit Number 8    Number of Visits 16    Date for Recertification  12/31/23    Authorization Type Medicare/AARP    Progress Note Due on Visit 10    PT Start Time 0934    PT Stop Time 1015    PT Time Calculation (min) 41 min    Equipment Utilized During Treatment Gait belt    Activity Tolerance Patient tolerated treatment well    Behavior During Therapy [facility] for tasks assessed/performed;Impulsive               Past Medical History:  Diagnosis Date   Balance problem    Gout    Hypercholesterolemia    Hypertension    Parkinson's disease (HCC)    Small bowel obstruction (HCC)    Past Surgical History:  Procedure Laterality Date   BOWEL BLOCKAGE  05/2019   IR ANGIO INTRA EXTRACRAN SEL COM CAROTID INNOMINATE BILAT MOD SED  10/03/2021   IR ANGIO VERTEBRAL SEL VERTEBRAL UNI R MOD SED  10/03/2021   IR RADIOLOGIST EVAL & MGMT  08/19/2021   IR US  GUIDE VASC ACCESS RIGHT  10/04/2021   Patient Active Problem List   Diagnosis Date Noted   Stenosis of right carotid artery 08/14/2023   Orthostatic hypotension 08/14/2023   Supine hypertension 08/14/2023   Hypercholesteremia 08/14/2023   Pain due to onychomycosis of toenails of both feet 09/03/2022   Porokeratosis 06/04/2022   Parkinson's disease (HCC) 02/06/2021   SBO (small bowel obstruction) (HCC) 07/06/2019    ONSET DATE: 3-4 years  REFERRING DIAG: R53.1 (ICD-10-CM) - Strength loss of R26.9 (ICD-10-CM) - Gait difficulty R26.89 (ICD-10-CM) - Balance problem Z74.09 (ICD-10-CM) - Impaired functional mobility, balance, and endurance  THERAPY DIAG:  Muscle weakness (generalized)  Abnormal posture  Unsteadiness on feet  Other abnormalities of gait and  mobility  Rationale for Evaluation and Treatment: Rehabilitation  SUBJECTIVE:                                                                                                                                                                                             SUBJECTIVE STATEMENT: Had a birthday over the weekend.  No new changes.  Want to work on balance and turns Pt accompanied by: Caregiver  PERTINENT HISTORY: Parkinson's, Left distal supraclinoid ICA aneurysm, mild cognitive impairment Has been  doing HHPT 1x/wk  PAIN:  Are you having pain? Yes: NPRS scale: 3/10 Pain location: Base of neck Pain description: like something's in there, pinching Aggravating factors: holding head down Relieving factors: Advil   PRECAUTIONS: None  RED FLAGS: None   WEIGHT BEARING RESTRICTIONS: No  FALLS: Has patient fallen in last 6 months? Yes. Number of falls last fell out of chair ~1.5 weeks ago; 2-3 falls   LIVING ENVIRONMENT: Lives with: lives with their family and Has a caregiver present 8a-12p daily; Wife is otherwise present Lives in: House/apartment Stairs: 3 steps to enter/exit Has following equipment at home: Vannie - 2 wheeled and Wheelchair (manual)  PLOF: Needs assistance with ADLs and Needs assistance with gait  PATIENT GOALS: Improve neck posture and balance  OBJECTIVE:    TODAY'S TREATMENT: 12/15/2023 Activity Comments  NuStep, Level 4>6, x 8 minutes Intervals of increased intensity, >90 SPM with Level 6 Aerobic warm up for neural priming  Modified quadruped PWR! Moves PWR! Moves PWR! [employer]! Twist PWR! Step At elevated mat, 5-10 reps each-cues for posture and eye boost  Standing at RW:  marching in place x 10 Side step and weightshift x 10   Gait with turns: Wide walking turns around hula hoops -smaller walking turns around poles Cues for lifting feet, staying in RW  Short distance gait with marching turn, step-to turns Multiple reps, supervision, cues  for moving RW and then feet       PATIENT EDUCATION: Education details: Educated pt and caregiver in turning techniques for optimal stability-working to turn RW, then turn body with step to pattern, to avoid turning body and RW at same time which causes him to be more off balance Person educated: Patient and Caregiver   Education method: Explanation Education comprehension: verbalized understanding    HOME EXERCISE PROGRAM: Access Code: [email or web address] URL: [email or web address] Date: 11/26/2023 Prepared by: Gellen April Earnie Starring  Exercises - Seated Thoracic Lumbar Extension with Pectoralis Stretch  - 3 x daily - 7 x weekly - 2-3 sets - 10 reps - Seated Cervical Retraction Extension Passive Loaded  - 3 x daily - 7 x weekly - 2-3 sets - 10 reps - Seated Cervical Retraction  - 1 x daily - 7 x weekly - 2 sets - 5 reps - 5 sec hold - Seated Diagonal Chops with Medicine Ball  - 1 x daily - 7 x weekly - 2 sets - 10 reps - Supine Isometric Neck Extension  - 1 x daily - 7 x weekly - 3 sets - 10 reps - 2 sec hold      Note: Objective measures were completed at Evaluation unless otherwise noted.  DIAGNOSTIC FINDINGS: n/a  COGNITION: Overall cognitive status: History of cognitive impairments - at baseline  COORDINATION: Tremors present throughout  POSTURE: rounded shoulders, forward head, and increased thoracic kyphosis  CERVICAL ROM: At rest flexed ~40 deg  Active ROM A/PROM (deg) eval  Flexion Can get chin to chest  Extension -40 to -20 deg (unable to go into full extension)  Right lateral flexion 40  Left lateral flexion 20  Right rotation 35  Left rotation 40   (Blank rows = not tested)   LOWER EXTREMITY ROM:   WNL  LOWER EXTREMITY MMT:    MMT Right Eval Left Eval  Hip flexion 5 5  Hip extension 4 4  Hip abduction 3+ 3+  Hip adduction    Hip internal rotation    Hip external rotation    Knee flexion  4- 3+  Knee extension 5 5  Ankle dorsiflexion     Ankle plantarflexion    Ankle inversion    Ankle eversion    (Blank rows = not tested)  BED MOBILITY:  Findings: Sit to supine SBA Supine to sit SBA Multiple small scooting motions  TRANSFERS: Sit to stand: Min A  Assistive device utilized: Environmental consultant - 2 wheeled     Stand to sit: CGA and Min A  Assistive device utilized: Environmental consultant - 2 wheeled     Min A for stability -- will at times require multiple attempts to come to standing  GAIT: Findings: Gait Characteristics: Festination at times, step through pattern, decreased step length- Right, decreased step length- Left, decreased stance time- Right, decreased stance time- Left, shuffling, trunk flexed, poor foot clearance- Right, and poor foot clearance- Left, Distance walked: ~10', and Comments: RW  FUNCTIONAL TESTS:  5 times sit to stand: 25.84 sec -- uses UEs to push off mat table, legs braced against table to stand, mild tremors/unsteadiness, requires a few attempts to obtain momentum to stand Timed up and go (TUG): 23.06 sec -- shuffling with turns, diminished step length, required 2 attempts to use momentum to stand  PATIENT SURVEYS:  PSFS: THE PATIENT SPECIFIC FUNCTIONAL SCALE  Place score of 0-10 (0 = unable to perform activity and 10 = able to perform activity at the same level as before injury or problem)  Activity Date: 11/05/23    Holding head up 5    2.  Standing balance  5    3.       4.      Total Score 5      Total Score = Sum of activity scores/number of activities  Minimally Detectable Change: 3 points (for single activity); 2 points (for average score)  [facility] (nd). The Patient Specific Functional Scale . Retrieved from [email or web address]                                                                                                                                  GOALS: Goals reviewed with patient? Yes  SHORT TERM GOALS: Target date:  12/03/2023   Pt will be able to perform HEP with caregiver min A Baseline: Goal status: MET 12/01/23  2.  Pt will be able to maintain neck extension to at least within 10 deg of neutral Baseline: pt is 40 to 20 deg away from neutral; 17 degrees flexion. Able to get to actively get to neutral neck posture 9.30.25 Goal status: NOT MET 12/01/23   LONG TERM GOALS: Target date: 12/31/2023   Pt will be able to perform head/neck and postural strengthening with SBA Baseline:  Goal status: INITIAL  2.  Pt will be able to maintain neutral neck for at least 10 min for ADLs Baseline: Unable Goal status: INITIAL  3.  Pt will report improved PSFS by >/=7 to demo MCID Baseline: 5  Goal status: INITIAL  4.  Pt will have improved 5x STS to </=20 sec to demo increased functional LE strength Baseline: 25.84 sec Goal status: INITIAL  5.  Pt will have improved TUG to </=15 sec to decrease fall risk Baseline: 23.06 sec Goal status: INITIAL    ASSESSMENT:  CLINICAL IMPRESSION: Pt presents today with no new complaints, but does want to focus on balance and turns. Initiated session with aerobic warm up then modified quadruped PWR! Moves, then worked on turns.  Began with wider turns, then tighter smaller turns, and pt appears most safe with turning RW, then stepping to complete the turn in sequencial steps, to lessen imbalance with tight turns.  Educated caregiver for safer turns at end of session. Pt will likely need continued practice and repetition for optimal carryover at home. Pt will continue to benefit from skilled PT towards goals for improved functional mobility and decreased fall risk.  OBJECTIVE IMPAIRMENTS: Abnormal gait, decreased activity tolerance, decreased balance, decreased endurance, decreased mobility, difficulty walking, decreased ROM, decreased strength, decreased safety awareness, hypomobility, increased fascial restrictions, increased muscle spasms, impaired flexibility, impaired  UE functional use, improper body mechanics, postural dysfunction, and pain.   ACTIVITY LIMITATIONS: carrying, lifting, bending, sitting, standing, squatting, stairs, transfers, bed mobility, bathing, toileting, dressing, reach over head, hygiene/grooming, and locomotion level  PARTICIPATION LIMITATIONS: meal prep, cleaning, laundry, medication management, driving, shopping, community activity, and yard work  PERSONAL FACTORS: Age, Fitness, Past/current experiences, and Time since onset of injury/illness/exacerbation are also affecting patient's functional outcome.   REHAB POTENTIAL: Good  CLINICAL DECISION MAKING: Evolving/moderate complexity  EVALUATION COMPLEXITY: Moderate  PLAN:  PT FREQUENCY: 2x/week  PT DURATION: 8 weeks  PLANNED INTERVENTIONS: 97164- PT Re-evaluation, 97110-Therapeutic exercises, 97530- Therapeutic activity, 97112- Neuromuscular re-education, 97535- Self Care, 02859- Manual therapy, [phone]- Gait training, [phone]- Aquatic Therapy, [phone]- Electrical stimulation (unattended), [phone] (1-2 muscles), 20561 (3+ muscles)- Dry Needling, Patient/Family education, Balance training, Stair training, Taping, Joint mobilization, Spinal mobilization, Cryotherapy, and Moist heat  PLAN FOR NEXT SESSION: Balance and turning work with RW.  Continue STS without UE technique. Work on deep neck muscle strength/endurance. Cervical/thoracic extension in gravity eliminated position to start and then slowly progress to against gravity. Posterior chain strengthening and gross balance.    Greig Anon, PT 12/15/23 11:54 AM Phone: [phone] Fax: [phone]  [facility] at [facility] [address] [place], [address] [place], [place] [address] Phone # [phone] Fax # [phone]

## 2023-12-16 NOTE — Telephone Encounter (Signed)
 Patient did not receive order put in the mail for him on 12/02/23

## 2023-12-16 NOTE — Telephone Encounter (Signed)
 Pt says he rcvd a call from [place]. And was returning her call

## 2023-12-17 ENCOUNTER — Ambulatory Visit

## 2023-12-17 DIAGNOSIS — R293 Abnormal posture: Secondary | ICD-10-CM

## 2023-12-17 DIAGNOSIS — M6281 Muscle weakness (generalized): Secondary | ICD-10-CM | POA: Diagnosis not present

## 2023-12-17 DIAGNOSIS — R2681 Unsteadiness on feet: Secondary | ICD-10-CM

## 2023-12-17 DIAGNOSIS — R269 Unspecified abnormalities of gait and mobility: Secondary | ICD-10-CM

## 2023-12-17 DIAGNOSIS — R2689 Other abnormalities of gait and mobility: Secondary | ICD-10-CM | POA: Diagnosis not present

## 2023-12-17 NOTE — Therapy (Signed)
 OUTPATIENT PHYSICAL THERAPY TREATMENT   Patient Name: Cody Dickerson MRN: [id number] DOB:[birth year], 73 y.o., male Today's Date: 12/17/2023   PCP: Okey Carlin Redbird, MD REFERRING PROVIDER: Evonnie Asberry RAMAN, DO  END OF SESSION:  PT End of Session - 12/17/23 0937     Visit Number 9    Number of Visits 16    Date for Recertification  12/31/23    Authorization Type Medicare/AARP    Progress Note Due on Visit 10    PT Start Time 0930    PT Stop Time 1015    PT Time Calculation (min) 45 min    Equipment Utilized During Treatment Gait belt    Activity Tolerance Patient tolerated treatment well    Behavior During Therapy [facility] for tasks assessed/performed;Impulsive               Past Medical History:  Diagnosis Date   Balance problem    Gout    Hypercholesterolemia    Hypertension    Parkinson's disease (HCC)    Small bowel obstruction (HCC)    Past Surgical History:  Procedure Laterality Date   BOWEL BLOCKAGE  05/2019   IR ANGIO INTRA EXTRACRAN SEL COM CAROTID INNOMINATE BILAT MOD SED  10/03/2021   IR ANGIO VERTEBRAL SEL VERTEBRAL UNI R MOD SED  10/03/2021   IR RADIOLOGIST EVAL & MGMT  08/19/2021   IR US  GUIDE VASC ACCESS RIGHT  10/04/2021   Patient Active Problem List   Diagnosis Date Noted   Stenosis of right carotid artery 08/14/2023   Orthostatic hypotension 08/14/2023   Supine hypertension 08/14/2023   Hypercholesteremia 08/14/2023   Pain due to onychomycosis of toenails of both feet 09/03/2022   Porokeratosis 06/04/2022   Parkinson's disease (HCC) 02/06/2021   SBO (small bowel obstruction) (HCC) 07/06/2019    ONSET DATE: 3-4 years  REFERRING DIAG: R53.1 (ICD-10-CM) - Strength loss of R26.9 (ICD-10-CM) - Gait difficulty R26.89 (ICD-10-CM) - Balance problem Z74.09 (ICD-10-CM) - Impaired functional mobility, balance, and endurance  THERAPY DIAG:  Muscle weakness (generalized)  Abnormal posture  Unsteadiness on feet  Other abnormalities of gait and  mobility  Gait difficulty  Rationale for Evaluation and Treatment: Rehabilitation  SUBJECTIVE:                                                                                                                                                                                             SUBJECTIVE STATEMENT: Neck/posture is still an issue.  No pain at rest Pt accompanied by: Caregiver  PERTINENT HISTORY: Parkinson's, Left distal supraclinoid ICA aneurysm, mild cognitive impairment Has been doing HHPT 1x/wk  PAIN:  Are you having pain? Yes: NPRS scale: 0/10 Pain location: Base of neck Pain description: like something's in there, pinching Aggravating factors: holding head down Relieving factors: Advil   PRECAUTIONS: None  RED FLAGS: None   WEIGHT BEARING RESTRICTIONS: No  FALLS: Has patient fallen in last 6 months? Yes. Number of falls last fell out of chair ~1.5 weeks ago; 2-3 falls   LIVING ENVIRONMENT: Lives with: lives with their family and Has a caregiver present 8a-12p daily; Wife is otherwise present Lives in: House/apartment Stairs: 3 steps to enter/exit Has following equipment at home: Vannie - 2 wheeled and Wheelchair (manual)  PLOF: Needs assistance with ADLs and Needs assistance with gait  PATIENT GOALS: Improve neck posture and balance  OBJECTIVE:    TODAY'S TREATMENT: 12/17/23 Activity Comments  quadruped Cervical joint position/proprioception activities w/ head-mounted laser using bullseye target and line tracing  Supine c-spine 2x10 -extension AROM mobilization w/ roll  -extension isometric -deep cervical flexion--discomfort with lowering.   Standing c-spine control Use of head-mounted laser and targets for kinesthetic/postural control             TODAY'S TREATMENT: 12/15/2023 Activity Comments  NuStep, Level 4>6, x 8 minutes Intervals of increased intensity, >90 SPM with Level 6 Aerobic warm up for neural priming  Modified quadruped PWR!  Moves PWR! Moves PWR! [employer]! Twist PWR! Step At elevated mat, 5-10 reps each-cues for posture and eye boost  Standing at RW:  marching in place x 10 Side step and weightshift x 10   Gait with turns: Wide walking turns around hula hoops -smaller walking turns around poles Cues for lifting feet, staying in RW  Short distance gait with marching turn, step-to turns Multiple reps, supervision, cues for moving RW and then feet       PATIENT EDUCATION: Education details: Educated pt and caregiver in turning techniques for optimal stability-working to turn RW, then turn body with step to pattern, to avoid turning body and RW at same time which causes him to be more off balance Person educated: Patient and Caregiver   Education method: Explanation Education comprehension: verbalized understanding    HOME EXERCISE PROGRAM: Access Code: [email or web address] URL: [email or web address] Date: 11/26/2023 Prepared by: Gellen April Earnie Starring  Exercises - Seated Thoracic Lumbar Extension with Pectoralis Stretch  - 3 x daily - 7 x weekly - 2-3 sets - 10 reps - Seated Cervical Retraction Extension Passive Loaded  - 3 x daily - 7 x weekly - 2-3 sets - 10 reps - Seated Cervical Retraction  - 1 x daily - 7 x weekly - 2 sets - 5 reps - 5 sec hold - Seated Diagonal Chops with Medicine Ball  - 1 x daily - 7 x weekly - 2 sets - 10 reps - Supine Isometric Neck Extension  - 1 x daily - 7 x weekly - 3 sets - 10 reps - 2 sec hold      Note: Objective measures were completed at Evaluation unless otherwise noted.  DIAGNOSTIC FINDINGS: n/a  COGNITION: Overall cognitive status: History of cognitive impairments - at baseline  COORDINATION: Tremors present throughout  POSTURE: rounded shoulders, forward head, and increased thoracic kyphosis  CERVICAL ROM: At rest flexed ~40 deg  Active ROM A/PROM (deg) eval  Flexion Can get chin to chest  Extension -40 to -20 deg (unable to go into full  extension)  Right lateral flexion 40  Left lateral flexion 20  Right rotation 35  Left rotation 40   (Blank rows =  not tested)   LOWER EXTREMITY ROM:   WNL  LOWER EXTREMITY MMT:    MMT Right Eval Left Eval  Hip flexion 5 5  Hip extension 4 4  Hip abduction 3+ 3+  Hip adduction    Hip internal rotation    Hip external rotation    Knee flexion 4- 3+  Knee extension 5 5  Ankle dorsiflexion    Ankle plantarflexion    Ankle inversion    Ankle eversion    (Blank rows = not tested)  BED MOBILITY:  Findings: Sit to supine SBA Supine to sit SBA Multiple small scooting motions  TRANSFERS: Sit to stand: Min A  Assistive device utilized: Environmental consultant - 2 wheeled     Stand to sit: CGA and Min A  Assistive device utilized: Environmental consultant - 2 wheeled     Min A for stability -- will at times require multiple attempts to come to standing  GAIT: Findings: Gait Characteristics: Festination at times, step through pattern, decreased step length- Right, decreased step length- Left, decreased stance time- Right, decreased stance time- Left, shuffling, trunk flexed, poor foot clearance- Right, and poor foot clearance- Left, Distance walked: ~10', and Comments: RW  FUNCTIONAL TESTS:  5 times sit to stand: 25.84 sec -- uses UEs to push off mat table, legs braced against table to stand, mild tremors/unsteadiness, requires a few attempts to obtain momentum to stand Timed up and go (TUG): 23.06 sec -- shuffling with turns, diminished step length, required 2 attempts to use momentum to stand  PATIENT SURVEYS:  PSFS: THE PATIENT SPECIFIC FUNCTIONAL SCALE  Place score of 0-10 (0 = unable to perform activity and 10 = able to perform activity at the same level as before injury or problem)  Activity Date: 11/05/23    Holding head up 5    2.  Standing balance  5    3.       4.      Total Score 5      Total Score = Sum of activity scores/number of activities  Minimally Detectable Change: 3 points (for single  activity); 2 points (for average score)  [facility] (nd). The Patient Specific Functional Scale . Retrieved from [email or web address]                                                                                                                                  GOALS: Goals reviewed with patient? Yes  SHORT TERM GOALS: Target date: 12/03/2023   Pt will be able to perform HEP with caregiver min A Baseline: Goal status: MET 12/01/23  2.  Pt will be able to maintain neck extension to at least within 10 deg of neutral Baseline: pt is 40 to 20 deg away from neutral; 17 degrees flexion. Able to get to actively get to neutral neck posture 9.30.25 Goal status: NOT MET 12/01/23   LONG TERM GOALS: Target date: 12/31/2023  Pt will be able to perform head/neck and postural strengthening with SBA Baseline:  Goal status: INITIAL  2.  Pt will be able to maintain neutral neck for at least 10 min for ADLs Baseline: Unable Goal status: INITIAL  3.  Pt will report improved PSFS by >/=7 to demo MCID Baseline: 5 Goal status: INITIAL  4.  Pt will have improved 5x STS to </=20 sec to demo increased functional LE strength Baseline: 25.84 sec Goal status: INITIAL  5.  Pt will have improved TUG to </=15 sec to decrease fall risk Baseline: 23.06 sec Goal status: INITIAL    ASSESSMENT:  CLINICAL IMPRESSION: Continued with postural re-education activities from various positions to improve cervical extension control and endurance with good improved control using head-mounted laser light for feedback.  Limited endurance for cervical extension requiring head drop every 5-8 sec from fatigue. Postural re-ed to promote upright posture, balance, and improve environmental interaction  Provided reference to device for home use. Continued sessions to improve motor control and mobility OBJECTIVE IMPAIRMENTS: Abnormal gait, decreased  activity tolerance, decreased balance, decreased endurance, decreased mobility, difficulty walking, decreased ROM, decreased strength, decreased safety awareness, hypomobility, increased fascial restrictions, increased muscle spasms, impaired flexibility, impaired UE functional use, improper body mechanics, postural dysfunction, and pain.   ACTIVITY LIMITATIONS: carrying, lifting, bending, sitting, standing, squatting, stairs, transfers, bed mobility, bathing, toileting, dressing, reach over head, hygiene/grooming, and locomotion level  PARTICIPATION LIMITATIONS: meal prep, cleaning, laundry, medication management, driving, shopping, community activity, and yard work  PERSONAL FACTORS: Age, Fitness, Past/current experiences, and Time since onset of injury/illness/exacerbation are also affecting patient's functional outcome.   REHAB POTENTIAL: Good  CLINICAL DECISION MAKING: Evolving/moderate complexity  EVALUATION COMPLEXITY: Moderate  PLAN:  PT FREQUENCY: 2x/week  PT DURATION: 8 weeks  PLANNED INTERVENTIONS: 97164- PT Re-evaluation, 97110-Therapeutic exercises, 97530- Therapeutic activity, 97112- Neuromuscular re-education, 97535- Self Care, 02859- Manual therapy, [phone]- Gait training, [phone]- Aquatic Therapy, [phone]- Electrical stimulation (unattended), [phone] (1-2 muscles), 20561 (3+ muscles)- Dry Needling, Patient/Family education, Balance training, Stair training, Taping, Joint mobilization, Spinal mobilization, Cryotherapy, and Moist heat  PLAN FOR NEXT SESSION: Balance and turning work with RW.  Continue STS without UE technique. Work on deep neck muscle strength/endurance. Cervical/thoracic extension in gravity eliminated position to start and then slowly progress to against gravity. Posterior chain strengthening and gross balance.    10:25 AM, 12/17/23 M. Kelly Taige Housman, PT, DPT Physical Therapist- [place] Office Number: [phone]

## 2023-12-21 NOTE — Therapy (Signed)
 OUTPATIENT PHYSICAL THERAPY TREATMENT   Patient Name: Cody Dickerson MRN: [id number] DOB:19-Apr-1950, 73 y.o., male Today's Date: 12/21/2023   PCP: Okey Carlin Redbird, MD REFERRING PROVIDER: Tat, Asberry RAMAN, DO  END OF SESSION:         Past Medical History:  Diagnosis Date   Balance problem    Gout    Hypercholesterolemia    Hypertension    Parkinson's disease (HCC)    Small bowel obstruction [facility])    Past Surgical History:  Procedure Laterality Date   BOWEL BLOCKAGE  05/2019   IR ANGIO INTRA EXTRACRAN SEL COM CAROTID INNOMINATE BILAT MOD SED  10/03/2021   IR ANGIO VERTEBRAL SEL VERTEBRAL UNI R MOD SED  10/03/2021   IR RADIOLOGIST EVAL & MGMT  08/19/2021   IR US  GUIDE VASC ACCESS RIGHT  10/04/2021   Patient Active Problem List   Diagnosis Date Noted   Stenosis of right carotid artery 08/14/2023   Orthostatic hypotension 08/14/2023   Supine hypertension 08/14/2023   Hypercholesteremia 08/14/2023   Pain due to onychomycosis of toenails of both feet 09/03/2022   Porokeratosis 06/04/2022   Parkinson's disease (HCC) 02/06/2021   SBO (small bowel obstruction) (HCC) 07/06/2019    ONSET DATE: 3-4 years  REFERRING DIAG: R53.1 (ICD-10-CM) - Strength loss of R26.9 (ICD-10-CM) - Gait difficulty R26.89 (ICD-10-CM) - Balance problem Z74.09 (ICD-10-CM) - Impaired functional mobility, balance, and endurance  THERAPY DIAG:  No diagnosis found.  Rationale for Evaluation and Treatment: Rehabilitation  SUBJECTIVE:                                                                                                                                                                                             SUBJECTIVE STATEMENT: Neck/posture is still an issue.  No pain at rest Pt accompanied by: Caregiver  PERTINENT HISTORY: Parkinson's, Left distal supraclinoid ICA aneurysm, mild cognitive impairment Has been doing HHPT 1x/wk  PAIN:  Are you having pain? Yes: NPRS scale: 0/10 Pain  location: Base of neck Pain description: like something's in there, pinching Aggravating factors: holding head down Relieving factors: Advil   PRECAUTIONS: None  RED FLAGS: None   WEIGHT BEARING RESTRICTIONS: No  FALLS: Has patient fallen in last 6 months? Yes. Number of falls last fell out of chair ~1.5 weeks ago; 2-3 falls   LIVING ENVIRONMENT: Lives with: lives with their family and Has a caregiver present 8a-12p daily; Wife is otherwise present Lives in: House/apartment Stairs: 3 steps to enter/exit Has following equipment at home: Vannie - 2 wheeled and Wheelchair (manual)  PLOF: Needs assistance with ADLs and Needs assistance with gait  PATIENT  GOALS: Improve neck posture and balance  OBJECTIVE:     TODAY'S TREATMENT: 12/22/23 Activity Comments                        TODAY'S TREATMENT: 12/17/23 Activity Comments  quadruped Cervical joint position/proprioception activities w/ head-mounted laser using bullseye target and line tracing  Supine c-spine 2x10 -extension AROM mobilization w/ roll  -extension isometric -deep cervical flexion--discomfort with lowering.   Standing c-spine control Use of head-mounted laser and targets for kinesthetic/postural control             TODAY'S TREATMENT: 12/15/2023 Activity Comments  NuStep, Level 4>6, x 8 minutes Intervals of increased intensity, >90 SPM with Level 6 Aerobic warm up for neural priming  Modified quadruped PWR! Moves PWR! Moves PWR! [employer]! Twist PWR! Step At elevated mat, 5-10 reps each-cues for posture and eye boost  Standing at RW:  marching in place x 10 Side step and weightshift x 10   Gait with turns: Wide walking turns around hula hoops -smaller walking turns around poles Cues for lifting feet, staying in RW  Short distance gait with marching turn, step-to turns Multiple reps, supervision, cues for moving RW and then feet       PATIENT EDUCATION: Education details: Educated pt and  caregiver in turning techniques for optimal stability-working to turn RW, then turn body with step to pattern, to avoid turning body and RW at same time which causes him to be more off balance Person educated: Patient and Caregiver   Education method: Explanation Education comprehension: verbalized understanding    HOME EXERCISE PROGRAM: Access Code: [email or web address] URL: [email or web address] Date: 11/26/2023 Prepared by: Gellen April Earnie Starring  Exercises - Seated Thoracic Lumbar Extension with Pectoralis Stretch  - 3 x daily - 7 x weekly - 2-3 sets - 10 reps - Seated Cervical Retraction Extension Passive Loaded  - 3 x daily - 7 x weekly - 2-3 sets - 10 reps - Seated Cervical Retraction  - 1 x daily - 7 x weekly - 2 sets - 5 reps - 5 sec hold - Seated Diagonal Chops with Medicine Ball  - 1 x daily - 7 x weekly - 2 sets - 10 reps - Supine Isometric Neck Extension  - 1 x daily - 7 x weekly - 3 sets - 10 reps - 2 sec hold      Note: Objective measures were completed at Evaluation unless otherwise noted.  DIAGNOSTIC FINDINGS: n/a  COGNITION: Overall cognitive status: History of cognitive impairments - at baseline  COORDINATION: Tremors present throughout  POSTURE: rounded shoulders, forward head, and increased thoracic kyphosis  CERVICAL ROM: At rest flexed ~40 deg  Active ROM A/PROM (deg) eval  Flexion Can get chin to chest  Extension -40 to -20 deg (unable to go into full extension)  Right lateral flexion 40  Left lateral flexion 20  Right rotation 35  Left rotation 40   (Blank rows = not tested)   LOWER EXTREMITY ROM:   WNL  LOWER EXTREMITY MMT:    MMT Right Eval Left Eval  Hip flexion 5 5  Hip extension 4 4  Hip abduction 3+ 3+  Hip adduction    Hip internal rotation    Hip external rotation    Knee flexion 4- 3+  Knee extension 5 5  Ankle dorsiflexion    Ankle plantarflexion    Ankle inversion    Ankle eversion    (Blank rows = not  tested)  BED MOBILITY:  Findings: Sit to supine SBA Supine to sit SBA Multiple small scooting motions  TRANSFERS: Sit to stand: Min A  Assistive device utilized: Environmental consultant - 2 wheeled     Stand to sit: CGA and Min A  Assistive device utilized: Environmental consultant - 2 wheeled     Min A for stability -- will at times require multiple attempts to come to standing  GAIT: Findings: Gait Characteristics: Festination at times, step through pattern, decreased step length- Right, decreased step length- Left, decreased stance time- Right, decreased stance time- Left, shuffling, trunk flexed, poor foot clearance- Right, and poor foot clearance- Left, Distance walked: ~10', and Comments: RW  FUNCTIONAL TESTS:  5 times sit to stand: 25.84 sec -- uses UEs to push off mat table, legs braced against table to stand, mild tremors/unsteadiness, requires a few attempts to obtain momentum to stand Timed up and go (TUG): 23.06 sec -- shuffling with turns, diminished step length, required 2 attempts to use momentum to stand  PATIENT SURVEYS:  PSFS: THE PATIENT SPECIFIC FUNCTIONAL SCALE  Place score of 0-10 (0 = unable to perform activity and 10 = able to perform activity at the same level as before injury or problem)  Activity Date: 11/05/23    Holding head up 5    2.  Standing balance  5    3.       4.      Total Score 5      Total Score = Sum of activity scores/number of activities  Minimally Detectable Change: 3 points (for single activity); 2 points (for average score)  [facility] (nd). The Patient Specific Functional Scale . Retrieved from [email or web address]                                                                                                                                  GOALS: Goals reviewed with patient? Yes  SHORT TERM GOALS: Target date: 12/03/2023   Pt will be able to perform HEP with caregiver min A Baseline: Goal  status: MET 12/01/23  2.  Pt will be able to maintain neck extension to at least within 10 deg of neutral Baseline: pt is 40 to 20 deg away from neutral; 17 degrees flexion. Able to get to actively get to neutral neck posture 9.30.25 Goal status: NOT MET 12/01/23   LONG TERM GOALS: Target date: 12/31/2023   Pt will be able to perform head/neck and postural strengthening with SBA Baseline:  Goal status: INITIAL  2.  Pt will be able to maintain neutral neck for at least 10 min for ADLs Baseline: Unable Goal status: INITIAL  3.  Pt will report improved PSFS by >/=7 to demo MCID Baseline: 5 Goal status: INITIAL  4.  Pt will have improved 5x STS to </=20 sec to demo increased functional LE strength Baseline: 25.84 sec Goal status: INITIAL  5.  Pt will have  improved TUG to </=15 sec to decrease fall risk Baseline: 23.06 sec Goal status: INITIAL    ASSESSMENT:  CLINICAL IMPRESSION: Continued with postural re-education activities from various positions to improve cervical extension control and endurance with good improved control using head-mounted laser light for feedback.  Limited endurance for cervical extension requiring head drop every 5-8 sec from fatigue. Postural re-ed to promote upright posture, balance, and improve environmental interaction  Provided reference to device for home use. Continued sessions to improve motor control and mobility OBJECTIVE IMPAIRMENTS: Abnormal gait, decreased activity tolerance, decreased balance, decreased endurance, decreased mobility, difficulty walking, decreased ROM, decreased strength, decreased safety awareness, hypomobility, increased fascial restrictions, increased muscle spasms, impaired flexibility, impaired UE functional use, improper body mechanics, postural dysfunction, and pain.   ACTIVITY LIMITATIONS: carrying, lifting, bending, sitting, standing, squatting, stairs, transfers, bed mobility, bathing, toileting, dressing, reach over  head, hygiene/grooming, and locomotion level  PARTICIPATION LIMITATIONS: meal prep, cleaning, laundry, medication management, driving, shopping, community activity, and yard work  PERSONAL FACTORS: Age, Fitness, Past/current experiences, and Time since onset of injury/illness/exacerbation are also affecting patient's functional outcome.   REHAB POTENTIAL: Good  CLINICAL DECISION MAKING: Evolving/moderate complexity  EVALUATION COMPLEXITY: Moderate  PLAN:  PT FREQUENCY: 2x/week  PT DURATION: 8 weeks  PLANNED INTERVENTIONS: 97164- PT Re-evaluation, 97110-Therapeutic exercises, 97530- Therapeutic activity, 97112- Neuromuscular re-education, 97535- Self Care, 02859- Manual therapy, [phone]- Gait training, [phone]- Aquatic Therapy, [phone]- Electrical stimulation (unattended), [phone] (1-2 muscles), 20561 (3+ muscles)- Dry Needling, Patient/Family education, Balance training, Stair training, Taping, Joint mobilization, Spinal mobilization, Cryotherapy, and Moist heat  PLAN FOR NEXT SESSION: Balance and turning work with RW.  Continue STS without UE technique. Work on deep neck muscle strength/endurance. Cervical/thoracic extension in gravity eliminated position to start and then slowly progress to against gravity. Posterior chain strengthening and gross balance.

## 2023-12-22 ENCOUNTER — Ambulatory Visit: Admitting: Physical Therapy

## 2023-12-22 ENCOUNTER — Encounter: Payer: Self-pay | Admitting: Physical Therapy

## 2023-12-22 DIAGNOSIS — R293 Abnormal posture: Secondary | ICD-10-CM | POA: Diagnosis not present

## 2023-12-22 DIAGNOSIS — R2681 Unsteadiness on feet: Secondary | ICD-10-CM

## 2023-12-22 DIAGNOSIS — M6281 Muscle weakness (generalized): Secondary | ICD-10-CM

## 2023-12-22 DIAGNOSIS — R2689 Other abnormalities of gait and mobility: Secondary | ICD-10-CM | POA: Diagnosis not present

## 2023-12-22 DIAGNOSIS — R269 Unspecified abnormalities of gait and mobility: Secondary | ICD-10-CM | POA: Diagnosis not present

## 2023-12-23 NOTE — Therapy (Incomplete)
 OUTPATIENT PHYSICAL THERAPY NOTE   Patient Name: NAVJOT LOERA MRN: [id number] DOB:[birth year], 73 y.o., male Today's Date: 12/23/2023   PCP: Okey Carlin Redbird, MD REFERRING PROVIDER: Tat, Asberry RAMAN, DO        END OF SESSION:          Past Medical History:  Diagnosis Date   Balance problem    Gout    Hypercholesterolemia    Hypertension    Parkinson's disease (HCC)    Small bowel obstruction [facility])    Past Surgical History:  Procedure Laterality Date   BOWEL BLOCKAGE  05/2019   IR ANGIO INTRA EXTRACRAN SEL COM CAROTID INNOMINATE BILAT MOD SED  10/03/2021   IR ANGIO VERTEBRAL SEL VERTEBRAL UNI R MOD SED  10/03/2021   IR RADIOLOGIST EVAL & MGMT  08/19/2021   IR US  GUIDE VASC ACCESS RIGHT  10/04/2021   Patient Active Problem List   Diagnosis Date Noted   Stenosis of right carotid artery 08/14/2023   Orthostatic hypotension 08/14/2023   Supine hypertension 08/14/2023   Hypercholesteremia 08/14/2023   Pain due to onychomycosis of toenails of both feet 09/03/2022   Porokeratosis 06/04/2022   Parkinson's disease (HCC) 02/06/2021   SBO (small bowel obstruction) (HCC) 07/06/2019    ONSET DATE: 3-4 years  REFERRING DIAG: R53.1 (ICD-10-CM) - Strength loss of R26.9 (ICD-10-CM) - Gait difficulty R26.89 (ICD-10-CM) - Balance problem Z74.09 (ICD-10-CM) - Impaired functional mobility, balance, and endurance  THERAPY DIAG:  No diagnosis found.  Rationale for Evaluation and Treatment: Rehabilitation  SUBJECTIVE:                                                                                                                                                                                             SUBJECTIVE STATEMENT: No new falls. Got onto the couch by himself yesterday. Reports doing fair with his neck positioning.   Pt accompanied by: Caregiver- Mary   PERTINENT HISTORY: Parkinson's, Left distal supraclinoid ICA aneurysm, mild cognitive impairment Has been doing  HHPT 1x/wk  PAIN:  Are you having pain? Yes: NPRS scale: 0/10 Pain location: Base of neck Pain description: like something's in there, pinching Aggravating factors: holding head down Relieving factors: Advil   PRECAUTIONS: None  RED FLAGS: None   WEIGHT BEARING RESTRICTIONS: No  FALLS: Has patient fallen in last 6 months? Yes. Number of falls last fell out of chair ~1.5 weeks ago; 2-3 falls   LIVING ENVIRONMENT: Lives with: lives with their family and Has a caregiver present 8a-12p daily; Wife is otherwise present Lives in: House/apartment Stairs: 3 steps to enter/exit Has following equipment at home: Vannie -  2 wheeled and Wheelchair (manual)  PLOF: Needs assistance with ADLs and Needs assistance with gait  PATIENT GOALS: Improve neck posture and balance  OBJECTIVE:     TODAY'S TREATMENT: 12/24/23 Activity Comments                        TODAY'S TREATMENT: 12/22/23 Activity Comments  STS x 5 Using RW in front  5xSTS 19.17 sec without letting go of armrests fully   TUG 23.93 sec with RW and CGA for safety   In II bars for safety: Red medball toss 90 deg turns 90, then 180 deg turns + medball toss w/ caregiver  staggered ant/post wt shift in II bars standing on incline   Occasional retripulsion with CGA-min A. Cueing for goal of 6 steps for 180 deg turns, 3 for 90 deg. Use of visual cues for posture. Some               PATIENT SURVEYS:  PSFS: THE PATIENT SPECIFIC FUNCTIONAL SCALE  Place score of 0-10 (0 = unable to perform activity and 10 = able to perform activity at the same level as before injury or problem)  Activity Date: 11/05/23 12/22/23   Holding head up 5 4   2.  Standing balance  5 10   3.       4.      Total Score 5 7     Total Score = Sum of activity scores/number of activities  Minimally Detectable Change: 3 points (for single activity); 2 points (for average score)  [facility] (nd). The Patient Specific  Functional Scale . Retrieved from [email or web address]                                                                                                                            PATIENT EDUCATION: Education details: discussed remainder of POC; progress towards goals and remaining impairments  Person educated: Patient Education method: Explanation Education comprehension: verbalized understanding    HOME EXERCISE PROGRAM: Access Code: [email or web address] URL: [email or web address] Date: 11/26/2023 Prepared by: Gellen April Earnie Starring  Exercises - Seated Thoracic Lumbar Extension with Pectoralis Stretch  - 3 x daily - 7 x weekly - 2-3 sets - 10 reps - Seated Cervical Retraction Extension Passive Loaded  - 3 x daily - 7 x weekly - 2-3 sets - 10 reps - Seated Cervical Retraction  - 1 x daily - 7 x weekly - 2 sets - 5 reps - 5 sec hold - Seated Diagonal Chops with Medicine Ball  - 1 x daily - 7 x weekly - 2 sets - 10 reps - Supine Isometric Neck Extension  - 1 x daily - 7 x weekly - 3 sets - 10 reps - 2 sec hold      Note: Objective measures were completed at Evaluation unless otherwise noted.  DIAGNOSTIC FINDINGS: n/a  COGNITION: Overall cognitive status: History of cognitive impairments - at  baseline  COORDINATION: Tremors present throughout  POSTURE: rounded shoulders, forward head, and increased thoracic kyphosis  CERVICAL ROM: At rest flexed ~40 deg  Active ROM A/PROM (deg) eval  Flexion Can get chin to chest  Extension -40 to -20 deg (unable to go into full extension)  Right lateral flexion 40  Left lateral flexion 20  Right rotation 35  Left rotation 40   (Blank rows = not tested)   LOWER EXTREMITY ROM:   WNL  LOWER EXTREMITY MMT:    MMT Right Eval Left Eval  Hip flexion 5 5  Hip extension 4 4  Hip abduction 3+ 3+  Hip adduction    Hip internal rotation    Hip external rotation    Knee flexion  4- 3+  Knee extension 5 5  Ankle dorsiflexion    Ankle plantarflexion    Ankle inversion    Ankle eversion    (Blank rows = not tested)  BED MOBILITY:  Findings: Sit to supine SBA Supine to sit SBA Multiple small scooting motions  TRANSFERS: Sit to stand: Min A  Assistive device utilized: Environmental consultant - 2 wheeled     Stand to sit: CGA and Min A  Assistive device utilized: Environmental consultant - 2 wheeled     Min A for stability -- will at times require multiple attempts to come to standing  GAIT: Findings: Gait Characteristics: Festination at times, step through pattern, decreased step length- Right, decreased step length- Left, decreased stance time- Right, decreased stance time- Left, shuffling, trunk flexed, poor foot clearance- Right, and poor foot clearance- Left, Distance walked: ~10', and Comments: RW  FUNCTIONAL TESTS:  5 times sit to stand: 25.84 sec -- uses UEs to push off mat table, legs braced against table to stand, mild tremors/unsteadiness, requires a few attempts to obtain momentum to stand Timed up and go (TUG): 23.06 sec -- shuffling with turns, diminished step length, required 2 attempts to use momentum to stand  PATIENT SURVEYS:  PSFS: THE PATIENT SPECIFIC FUNCTIONAL SCALE  Place score of 0-10 (0 = unable to perform activity and 10 = able to perform activity at the same level as before injury or problem)  Activity Date: 11/05/23    Holding head up 5    2.  Standing balance  5    3.       4.      Total Score 5      Total Score = Sum of activity scores/number of activities  Minimally Detectable Change: 3 points (for single activity); 2 points (for average score)  [facility] (nd). The Patient Specific Functional Scale . Retrieved from [email or web address]                                                                                                                                  GOALS: Goals reviewed with  patient? Yes  SHORT TERM GOALS: Target date: 12/03/2023  Pt will be able to perform HEP with caregiver min A Baseline: Goal status: MET 12/01/23  2.  Pt will be able to maintain neck extension to at least within 10 deg of neutral Baseline: pt is 40 to 20 deg away from neutral; 17 degrees flexion. Able to get to actively get to neutral neck posture 9.30.25 Goal status: NOT MET 12/01/23   LONG TERM GOALS: Target date: 12/31/2023   Pt will be able to perform head/neck and postural strengthening with SBA Baseline: pt and caregiver have reported understanding Goal status: IN PROGRESS 12/22/23  2.  Pt will be able to maintain neutral neck for at least 10 min for ADLs Baseline: Unable; unchanged 12/22/23 Goal status: IN PROGRESS 12/22/23  3.  Pt will report improved PSFS by >/=7 to demo MCID Baseline: 5; 7 12/22/23 Goal status: MET 0/21/25  4.  Pt will have improved 5x STS to </=20 sec to demo increased functional LE strength Baseline: 25.84 sec; 19.17 sec without letting go of armrests fully 12/22/23 Goal status: IN PROGRESS  12/22/23  5.  Pt will have improved TUG to </=15 sec to decrease fall risk Baseline: 23.06 sec; 23.93 sec with RW 12/22/23 Goal status: IN PROGRESS 12/22/23    ASSESSMENT:  CLINICAL IMPRESSION: Patient arrived to session without new complaints. 5xSTS score improved but still indicates an increased risk of falls. TUG is grossly unchanged. PSFS score has increased to 7/10 due to perceived improvement in standing balance. However, patient still demonstrates significant head/neck flexion and has trouble maintaining neutral head posture. Standing balance activities focused on turns and standing balance with verbal cueing and occasional CGA-min A for imbalance. Patient is progressing towards goals. Would benefit from additional skilled PT services to address remaining goals.  OBJECTIVE IMPAIRMENTS: Abnormal gait, decreased activity tolerance, decreased balance,  decreased endurance, decreased mobility, difficulty walking, decreased ROM, decreased strength, decreased safety awareness, hypomobility, increased fascial restrictions, increased muscle spasms, impaired flexibility, impaired UE functional use, improper body mechanics, postural dysfunction, and pain.   ACTIVITY LIMITATIONS: carrying, lifting, bending, sitting, standing, squatting, stairs, transfers, bed mobility, bathing, toileting, dressing, reach over head, hygiene/grooming, and locomotion level  PARTICIPATION LIMITATIONS: meal prep, cleaning, laundry, medication management, driving, shopping, community activity, and yard work  PERSONAL FACTORS: Age, Fitness, Past/current experiences, and Time since onset of injury/illness/exacerbation are also affecting patient's functional outcome.   REHAB POTENTIAL: Good  CLINICAL DECISION MAKING: Evolving/moderate complexity  EVALUATION COMPLEXITY: Moderate  PLAN:  PT FREQUENCY: 2x/week  PT DURATION: 8 weeks  PLANNED INTERVENTIONS: 97164- PT Re-evaluation, 97110-Therapeutic exercises, 97530- Therapeutic activity, 97112- Neuromuscular re-education, 97535- Self Care, 02859- Manual therapy, [phone]- Gait training, [phone]- Aquatic Therapy, [phone]- Electrical stimulation (unattended), [phone] (1-2 muscles), 20561 (3+ muscles)- Dry Needling, Patient/Family education, Balance training, Stair training, Taping, Joint mobilization, Spinal mobilization, Cryotherapy, and Moist heat  PLAN FOR NEXT SESSION: Balance and turning work with RW.  Continue STS without UE technique. Work on deep neck muscle strength/endurance. Cervical/thoracic extension in gravity eliminated position to start and then slowly progress to against gravity. Posterior chain strengthening and gross balance.    Louana Terrilyn Christians, PT, DPT 12/23/23 2:15 PM  [facility] at [facility] [address] [place], [address] [place], [place] [address] Phone # [phone] Fax #  [phone]

## 2023-12-24 ENCOUNTER — Ambulatory Visit: Admitting: Physical Therapy

## 2023-12-25 ENCOUNTER — Telehealth: Payer: Self-pay | Admitting: Neurology

## 2023-12-25 NOTE — Telephone Encounter (Signed)
 Pt would like a return call. Thanks

## 2023-12-29 ENCOUNTER — Ambulatory Visit

## 2023-12-29 DIAGNOSIS — M6281 Muscle weakness (generalized): Secondary | ICD-10-CM

## 2023-12-29 DIAGNOSIS — R269 Unspecified abnormalities of gait and mobility: Secondary | ICD-10-CM | POA: Diagnosis not present

## 2023-12-29 DIAGNOSIS — R2689 Other abnormalities of gait and mobility: Secondary | ICD-10-CM | POA: Diagnosis not present

## 2023-12-29 DIAGNOSIS — R2681 Unsteadiness on feet: Secondary | ICD-10-CM | POA: Diagnosis not present

## 2023-12-29 DIAGNOSIS — R293 Abnormal posture: Secondary | ICD-10-CM

## 2023-12-29 NOTE — Therapy (Signed)
 OUTPATIENT PHYSICAL THERAPY TREATMENT   Patient Name: Cody Dickerson MRN: [id number] DOB:30-Jul-1950, 73 y.o., male Today's Date: 12/29/2023   PCP: Okey Carlin Redbird, MD REFERRING PROVIDER: Evonnie Asberry RAMAN, DO   END OF SESSION:  PT End of Session - 12/29/23 0931     Visit Number 11    Number of Visits 16    Date for Recertification  12/31/23    Authorization Type Medicare/[employer]    Progress Note Due on Visit 20    PT Start Time 0931    PT Stop Time 1015    PT Time Calculation (min) 44 min    Equipment Utilized During Treatment Gait belt    Activity Tolerance Patient tolerated treatment well    Behavior During Therapy [facility] for tasks assessed/performed;Impulsive                Past Medical History:  Diagnosis Date   Balance problem    Gout    Hypercholesterolemia    Hypertension    Parkinson's disease (HCC)    Small bowel obstruction (HCC)    Past Surgical History:  Procedure Laterality Date   BOWEL BLOCKAGE  05/2019   IR ANGIO INTRA EXTRACRAN SEL COM CAROTID INNOMINATE BILAT MOD SED  10/03/2021   IR ANGIO VERTEBRAL SEL VERTEBRAL UNI R MOD SED  10/03/2021   IR RADIOLOGIST EVAL & MGMT  08/19/2021   IR US  GUIDE VASC ACCESS RIGHT  10/04/2021   Patient Active Problem List   Diagnosis Date Noted   Stenosis of right carotid artery 08/14/2023   Orthostatic hypotension 08/14/2023   Supine hypertension 08/14/2023   Hypercholesteremia 08/14/2023   Pain due to onychomycosis of toenails of both feet 09/03/2022   Porokeratosis 06/04/2022   Parkinson's disease (HCC) 02/06/2021   SBO (small bowel obstruction) (HCC) 07/06/2019    ONSET DATE: 3-4 years  REFERRING DIAG: R53.1 (ICD-10-CM) - Strength loss of R26.9 (ICD-10-CM) - Gait difficulty R26.89 (ICD-10-CM) - Balance problem Z74.09 (ICD-10-CM) - Impaired functional mobility, balance, and endurance  THERAPY DIAG:  Muscle weakness (generalized)  Abnormal posture  Unsteadiness on feet  Other abnormalities of gait and  mobility  Gait difficulty  Rationale for Evaluation and Treatment: Rehabilitation  SUBJECTIVE:                                                                                                                                                                                             SUBJECTIVE STATEMENT: Doing ok, the neck is a little sore from   Pt accompanied by: Caregiver- Mary   PERTINENT HISTORY: Parkinson's, Left distal supraclinoid ICA aneurysm, mild cognitive impairment Has been  doing HHPT 1x/wk  PAIN:  Are you having pain? Yes: NPRS scale: 0/10 Pain location: Base of neck Pain description: like something's in there, pinching Aggravating factors: holding head down Relieving factors: Advil   PRECAUTIONS: None  RED FLAGS: None   WEIGHT BEARING RESTRICTIONS: No  FALLS: Has patient fallen in last 6 months? Yes. Number of falls last fell out of chair ~1.5 weeks ago; 2-3 falls   LIVING ENVIRONMENT: Lives with: lives with their family and Has a caregiver present 8a-12p daily; Wife is otherwise present Lives in: House/apartment Stairs: 3 steps to enter/exit Has following equipment at home: Vannie - 2 wheeled and Wheelchair (manual)  PLOF: Needs assistance with ADLs and Needs assistance with gait  PATIENT GOALS: Improve neck posture and balance  OBJECTIVE:   TODAY'S TREATMENT: 12/29/23 Activity Comments  Dynamic balance x 2 min -kicking physioball -pickup physioball-chest pass  Gait training x 2 min rounds w/ U-step -self-chosen speed/stride -counted steps for incr stride length (able to travel 45 ft in 24 steps, from 28) -trials for increase speed (1.5 min before fatigue) -visual strategies for turning during gait  Freezing of Gait strategies  -visual imagery: clock turns -auditory:   Treadmill training X 4 min 1.5 mph w/ verbal cues for stride length--minimal carryover X2 min 1.8 mph w/ visual target for foot advancement             TODAY'S TREATMENT:  12/22/23 Activity Comments  STS x 5 Using RW in front  5xSTS 19.17 sec without letting go of armrests fully   TUG 23.93 sec with RW and CGA for safety   In II bars for safety: Red medball toss 90 deg turns 90, then 180 deg turns + medball toss w/ caregiver  staggered ant/post wt shift in II bars standing on incline   Occasional retripulsion with CGA-min A. Cueing for goal of 6 steps for 180 deg turns, 3 for 90 deg. Use of visual cues for posture. Some               PATIENT SURVEYS:  PSFS: THE PATIENT SPECIFIC FUNCTIONAL SCALE  Place score of 0-10 (0 = unable to perform activity and 10 = able to perform activity at the same level as before injury or problem)  Activity Date: 11/05/23 12/22/23   Holding head up 5 4   2.  Standing balance  5 10   3.       4.      Total Score 5 7     Total Score = Sum of activity scores/number of activities  Minimally Detectable Change: 3 points (for single activity); 2 points (for average score)  [facility] (nd). The Patient Specific Functional Scale . Retrieved from [email or web address]                                                                                                                            PATIENT EDUCATION: Education details: discussed remainder of POC;  progress towards goals and remaining impairments  Person educated: Patient Education method: Explanation Education comprehension: verbalized understanding    HOME EXERCISE PROGRAM: Access Code: [email or web address] URL: [email or web address] Date: 11/26/2023 Prepared by: Gellen April Earnie Starring  Exercises - Seated Thoracic Lumbar Extension with Pectoralis Stretch  - 3 x daily - 7 x weekly - 2-3 sets - 10 reps - Seated Cervical Retraction Extension Passive Loaded  - 3 x daily - 7 x weekly - 2-3 sets - 10 reps - Seated Cervical Retraction  - 1 x daily - 7 x weekly - 2 sets - 5 reps - 5 sec hold -  Seated Diagonal Chops with Medicine Ball  - 1 x daily - 7 x weekly - 2 sets - 10 reps - Supine Isometric Neck Extension  - 1 x daily - 7 x weekly - 3 sets - 10 reps - 2 sec hold      Note: Objective measures were completed at Evaluation unless otherwise noted.  DIAGNOSTIC FINDINGS: n/a  COGNITION: Overall cognitive status: History of cognitive impairments - at baseline  COORDINATION: Tremors present throughout  POSTURE: rounded shoulders, forward head, and increased thoracic kyphosis  CERVICAL ROM: At rest flexed ~40 deg  Active ROM A/PROM (deg) eval  Flexion Can get chin to chest  Extension -40 to -20 deg (unable to go into full extension)  Right lateral flexion 40  Left lateral flexion 20  Right rotation 35  Left rotation 40   (Blank rows = not tested)   LOWER EXTREMITY ROM:   WNL  LOWER EXTREMITY MMT:    MMT Right Eval Left Eval  Hip flexion 5 5  Hip extension 4 4  Hip abduction 3+ 3+  Hip adduction    Hip internal rotation    Hip external rotation    Knee flexion 4- 3+  Knee extension 5 5  Ankle dorsiflexion    Ankle plantarflexion    Ankle inversion    Ankle eversion    (Blank rows = not tested)  BED MOBILITY:  Findings: Sit to supine SBA Supine to sit SBA Multiple small scooting motions  TRANSFERS: Sit to stand: Min A  Assistive device utilized: Environmental Consultant - 2 wheeled     Stand to sit: CGA and Min A  Assistive device utilized: Environmental Consultant - 2 wheeled     Min A for stability -- will at times require multiple attempts to come to standing  GAIT: Findings: Gait Characteristics: Festination at times, step through pattern, decreased step length- Right, decreased step length- Left, decreased stance time- Right, decreased stance time- Left, shuffling, trunk flexed, poor foot clearance- Right, and poor foot clearance- Left, Distance walked: ~10', and Comments: RW  FUNCTIONAL TESTS:  5 times sit to stand: 25.84 sec -- uses UEs to push off mat table, legs braced  against table to stand, mild tremors/unsteadiness, requires a few attempts to obtain momentum to stand Timed up and go (TUG): 23.06 sec -- shuffling with turns, diminished step length, required 2 attempts to use momentum to stand  PATIENT SURVEYS:  PSFS: THE PATIENT SPECIFIC FUNCTIONAL SCALE  Place score of 0-10 (0 = unable to perform activity and 10 = able to perform activity at the same level as before injury or problem)  Activity Date: 11/05/23    Holding head up 5    2.  Standing balance  5    3.       4.      Total Score 5  Total Score = Sum of activity scores/number of activities  Minimally Detectable Change: 3 points (for single activity); 2 points (for average score)  [facility] (nd). The Patient Specific Functional Scale . Retrieved from [email or web address]                                                                                                                                  GOALS: Goals reviewed with patient? Yes  SHORT TERM GOALS: Target date: 12/03/2023   Pt will be able to perform HEP with caregiver min A Baseline: Goal status: MET 12/01/23  2.  Pt will be able to maintain neck extension to at least within 10 deg of neutral Baseline: pt is 40 to 20 deg away from neutral; 17 degrees flexion. Able to get to actively get to neutral neck posture 9.30.25 Goal status: NOT MET 12/01/23   LONG TERM GOALS: Target date: 12/31/2023   Pt will be able to perform head/neck and postural strengthening with SBA Baseline: pt and caregiver have reported understanding Goal status: IN PROGRESS 12/22/23  2.  Pt will be able to maintain neutral neck for at least 10 min for ADLs Baseline: Unable; unchanged 12/22/23 Goal status: IN PROGRESS 12/22/23  3.  Pt will report improved PSFS by >/=7 to demo MCID Baseline: 5; 7 12/22/23 Goal status: MET 0/21/25  4.  Pt will have improved 5x STS to </=20 sec  to demo increased functional LE strength Baseline: 25.84 sec; 19.17 sec without letting go of armrests fully 12/22/23 Goal status: IN PROGRESS  12/22/23  5.  Pt will have improved TUG to </=15 sec to decrease fall risk Baseline: 23.06 sec; 23.93 sec with RW 12/22/23 Goal status: IN PROGRESS 12/22/23    ASSESSMENT:  CLINICAL IMPRESSION: Dynamic standing balance atcivities to promote postural stability and single limb support and trunk flexion ot reduce retropulsion to prepare for gait. Gait training to increase stride length, gait speed, and strategies to address freezing of gait.  Did well with visual and auditory cues for sequencing with improved carryover to negotiating 180 degree turns from initially requiring 10 steps to 6-8 steps. CGA-SBA with ambulation level surfaces and U-step followed by CGA for treadmill training which was tolerated very well OBJECTIVE IMPAIRMENTS: Abnormal gait, decreased activity tolerance, decreased balance, decreased endurance, decreased mobility, difficulty walking, decreased ROM, decreased strength, decreased safety awareness, hypomobility, increased fascial restrictions, increased muscle spasms, impaired flexibility, impaired UE functional use, improper body mechanics, postural dysfunction, and pain.   ACTIVITY LIMITATIONS: carrying, lifting, bending, sitting, standing, squatting, stairs, transfers, bed mobility, bathing, toileting, dressing, reach over head, hygiene/grooming, and locomotion level  PARTICIPATION LIMITATIONS: meal prep, cleaning, laundry, medication management, driving, shopping, community activity, and yard work  PERSONAL FACTORS: Age, Fitness, Past/current experiences, and Time since onset of injury/illness/exacerbation are also affecting patient's functional outcome.   REHAB POTENTIAL: Good  CLINICAL DECISION MAKING: Evolving/moderate complexity  EVALUATION COMPLEXITY: Moderate  PLAN:  PT FREQUENCY: 2x/week  PT DURATION: 8  weeks  PLANNED INTERVENTIONS: 97164- PT Re-evaluation, 97110-Therapeutic exercises, 97530- Therapeutic activity, 97112- Neuromuscular re-education, 97535- Self Care, [id number]- Manual therapy, [phone]- Gait training, [phone]- Aquatic Therapy, [phone]- Electrical stimulation (unattended), [phone] (1-2 muscles), [id number] (3+ muscles)- Dry Needling, Patient/Family education, Balance training, Stair training, Taping, Joint mobilization, Spinal mobilization, Cryotherapy, and Moist heat  PLAN FOR NEXT SESSION: Balance and turning work with RW.  Continue STS without UE technique. Work on deep neck muscle strength/endurance. Cervical/thoracic extension in gravity eliminated position to start and then slowly progress to against gravity. Posterior chain strengthening and gross balance.    10:19 AM, 12/29/23 M. Kelly Fredick Schlosser, PT, DPT Physical Therapist-  Office Number: [phone]

## 2023-12-30 NOTE — Telephone Encounter (Signed)
 Called pateint and he just received DME and is getting measurements and everything is moving along

## 2023-12-31 ENCOUNTER — Ambulatory Visit

## 2023-12-31 DIAGNOSIS — R2689 Other abnormalities of gait and mobility: Secondary | ICD-10-CM | POA: Diagnosis not present

## 2023-12-31 DIAGNOSIS — R293 Abnormal posture: Secondary | ICD-10-CM

## 2023-12-31 DIAGNOSIS — R269 Unspecified abnormalities of gait and mobility: Secondary | ICD-10-CM | POA: Diagnosis not present

## 2023-12-31 DIAGNOSIS — R2681 Unsteadiness on feet: Secondary | ICD-10-CM

## 2023-12-31 DIAGNOSIS — M6281 Muscle weakness (generalized): Secondary | ICD-10-CM | POA: Diagnosis not present

## 2023-12-31 NOTE — Therapy (Signed)
 OUTPATIENT PHYSICAL THERAPY TREATMENT and Recertification   Patient Name: Cody Dickerson MRN: [id number] DOB:20-Jan-1951, 73 y.o., male Today's Date: 12/31/2023   PCP: Okey Carlin Redbird, MD REFERRING PROVIDER: Evonnie Asberry RAMAN, DO   END OF SESSION:  PT End of Session - 12/31/23 0940     Visit Number 12    Number of Visits 18    Date for Recertification  01/28/24    Authorization Type Medicare/[employer]    Progress Note Due on Visit 20    PT Start Time 0930    PT Stop Time 1015    PT Time Calculation (min) 45 min    Equipment Utilized During Treatment Gait belt    Activity Tolerance Patient tolerated treatment well    Behavior During Therapy [facility] for tasks assessed/performed;Impulsive                Past Medical History:  Diagnosis Date   Balance problem    Gout    Hypercholesterolemia    Hypertension    Parkinson's disease (HCC)    Small bowel obstruction (HCC)    Past Surgical History:  Procedure Laterality Date   BOWEL BLOCKAGE  05/2019   IR ANGIO INTRA EXTRACRAN SEL COM CAROTID INNOMINATE BILAT MOD SED  10/03/2021   IR ANGIO VERTEBRAL SEL VERTEBRAL UNI R MOD SED  10/03/2021   IR RADIOLOGIST EVAL & MGMT  08/19/2021   IR US  GUIDE VASC ACCESS RIGHT  10/04/2021   Patient Active Problem List   Diagnosis Date Noted   Stenosis of right carotid artery 08/14/2023   Orthostatic hypotension 08/14/2023   Supine hypertension 08/14/2023   Hypercholesteremia 08/14/2023   Pain due to onychomycosis of toenails of both feet 09/03/2022   Porokeratosis 06/04/2022   Parkinson's disease (HCC) 02/06/2021   SBO (small bowel obstruction) (HCC) 07/06/2019    ONSET DATE: 3-4 years  REFERRING DIAG: R53.1 (ICD-10-CM) - Strength loss of R26.9 (ICD-10-CM) - Gait difficulty R26.89 (ICD-10-CM) - Balance problem Z74.09 (ICD-10-CM) - Impaired functional mobility, balance, and endurance  THERAPY DIAG:  Muscle weakness (generalized)  Abnormal posture  Unsteadiness on feet  Other  abnormalities of gait and mobility  Gait difficulty  Rationale for Evaluation and Treatment: Rehabilitation  SUBJECTIVE:                                                                                                                                                                                             SUBJECTIVE STATEMENT: Doing ok, the left shoulder is painful with overhead movements  Pt accompanied by: Caregiver- Mary   PERTINENT HISTORY: Parkinson's, Left distal supraclinoid ICA aneurysm, mild cognitive impairment  Has been doing HHPT 1x/wk  PAIN:  Are you having pain? Yes: NPRS scale: 0/10 Pain location: Base of neck Pain description: like something's in there, pinching Aggravating factors: holding head down Relieving factors: Advil   PRECAUTIONS: None  RED FLAGS: None   WEIGHT BEARING RESTRICTIONS: No  FALLS: Has patient fallen in last 6 months? Yes. Number of falls last fell out of chair ~1.5 weeks ago; 2-3 falls   LIVING ENVIRONMENT: Lives with: lives with their family and Has a caregiver present 8a-12p daily; Wife is otherwise present Lives in: House/apartment Stairs: 3 steps to enter/exit Has following equipment at home: Vannie - 2 wheeled and Wheelchair (manual)  PLOF: Needs assistance with ADLs and Needs assistance with gait  PATIENT GOALS: Improve neck posture and balance  OBJECTIVE:   TODAY'S TREATMENT: 12/31/23 Activity Comments  NU-step resistance intervals x 8 min   Dynamic balance -standing PWR moves 1x10--CGA/min A for support -lateral step over 6 hurdles: from BUE to no UE support -forward step over 6 hurdles from BUE to no UE support ---retropulsion/LOB 25-50% of trials when no UE support  Gait w/ U-step negotiating tight turns  Static balance  On compliant surface and trials in unsupported and foot advancement              TODAY'S TREATMENT: 12/29/23 Activity Comments  Dynamic balance x 2 min -kicking physioball -pickup  physioball-chest pass  Gait training x 2 min rounds w/ U-step -self-chosen speed/stride -counted steps for incr stride length (able to travel 45 ft in 24 steps, from 28) -trials for increase speed (1.5 min before fatigue) -visual strategies for turning during gait  Freezing of Gait strategies  -visual imagery: clock turns -auditory:   Treadmill training X 4 min 1.5 mph w/ verbal cues for stride length--minimal carryover X2 min 1.8 mph w/ visual target for foot advancement             TODAY'S TREATMENT: 12/22/23 Activity Comments  STS x 5 Using RW in front  5xSTS 19.17 sec without letting go of armrests fully   TUG 23.93 sec with RW and CGA for safety   In II bars for safety: Red medball toss 90 deg turns 90, then 180 deg turns + medball toss w/ caregiver  staggered ant/post wt shift in II bars standing on incline   Occasional retripulsion with CGA-min A. Cueing for goal of 6 steps for 180 deg turns, 3 for 90 deg. Use of visual cues for posture. Some               PATIENT SURVEYS:  PSFS: THE PATIENT SPECIFIC FUNCTIONAL SCALE  Place score of 0-10 (0 = unable to perform activity and 10 = able to perform activity at the same level as before injury or problem)  Activity Date: 11/05/23 12/22/23   Holding head up 5 4   2.  Standing balance  5 10   3.       4.      Total Score 5 7     Total Score = Sum of activity scores/number of activities  Minimally Detectable Change: 3 points (for single activity); 2 points (for average score)  [facility] (nd). The Patient Specific Functional Scale . Retrieved from [email or web address]  PATIENT EDUCATION: Education details: discussed remainder of POC; progress towards goals and remaining impairments  Person educated: Patient Education method:  Explanation Education comprehension: verbalized understanding    HOME EXERCISE PROGRAM: Access Code: [email or web address] URL: [email or web address] Date: 11/26/2023 Prepared by: Gellen April Earnie Starring  Exercises - Seated Thoracic Lumbar Extension with Pectoralis Stretch  - 3 x daily - 7 x weekly - 2-3 sets - 10 reps - Seated Cervical Retraction Extension Passive Loaded  - 3 x daily - 7 x weekly - 2-3 sets - 10 reps - Seated Cervical Retraction  - 1 x daily - 7 x weekly - 2 sets - 5 reps - 5 sec hold - Seated Diagonal Chops with Medicine Ball  - 1 x daily - 7 x weekly - 2 sets - 10 reps - Supine Isometric Neck Extension  - 1 x daily - 7 x weekly - 3 sets - 10 reps - 2 sec hold      Note: Objective measures were completed at Evaluation unless otherwise noted.  DIAGNOSTIC FINDINGS: n/a  COGNITION: Overall cognitive status: History of cognitive impairments - at baseline  COORDINATION: Tremors present throughout  POSTURE: rounded shoulders, forward head, and increased thoracic kyphosis  CERVICAL ROM: At rest flexed ~40 deg  Active ROM A/PROM (deg) eval  Flexion Can get chin to chest  Extension -40 to -20 deg (unable to go into full extension)  Right lateral flexion 40  Left lateral flexion 20  Right rotation 35  Left rotation 40   (Blank rows = not tested)   LOWER EXTREMITY ROM:   WNL  LOWER EXTREMITY MMT:    MMT Right Eval Left Eval  Hip flexion 5 5  Hip extension 4 4  Hip abduction 3+ 3+  Hip adduction    Hip internal rotation    Hip external rotation    Knee flexion 4- 3+  Knee extension 5 5  Ankle dorsiflexion    Ankle plantarflexion    Ankle inversion    Ankle eversion    (Blank rows = not tested)  BED MOBILITY:  Findings: Sit to supine SBA Supine to sit SBA Multiple small scooting motions  TRANSFERS: Sit to stand: Min A  Assistive device utilized: Environmental Consultant - 2 wheeled     Stand to sit: CGA and Min A  Assistive device utilized: Environmental Consultant - 2  wheeled     Min A for stability -- will at times require multiple attempts to come to standing  GAIT: Findings: Gait Characteristics: Festination at times, step through pattern, decreased step length- Right, decreased step length- Left, decreased stance time- Right, decreased stance time- Left, shuffling, trunk flexed, poor foot clearance- Right, and poor foot clearance- Left, Distance walked: ~10', and Comments: RW  FUNCTIONAL TESTS:  5 times sit to stand: 25.84 sec -- uses UEs to push off mat table, legs braced against table to stand, mild tremors/unsteadiness, requires a few attempts to obtain momentum to stand Timed up and go (TUG): 23.06 sec -- shuffling with turns, diminished step length, required 2 attempts to use momentum to stand  PATIENT SURVEYS:  PSFS: THE PATIENT SPECIFIC FUNCTIONAL SCALE  Place score of 0-10 (0 = unable to perform activity and 10 = able to perform activity at the same level as before injury or problem)  Activity Date: 11/05/23    Holding head up 5    2.  Standing balance  5    3.       4.  Total Score 5      Total Score = Sum of activity scores/number of activities  Minimally Detectable Change: 3 points (for single activity); 2 points (for average score)  [facility] (nd). The Patient Specific Functional Scale . Retrieved from [email or web address]                                                                                                                                  GOALS: Goals reviewed with patient? Yes  SHORT TERM GOALS: Target date: 12/03/2023   Pt will be able to perform HEP with caregiver min A Baseline: Goal status: MET 12/01/23  2.  Pt will be able to maintain neck extension to at least within 10 deg of neutral Baseline: pt is 40 to 20 deg away from neutral; 17 degrees flexion. Able to get to actively get to neutral neck posture 9.30.25 Goal status: NOT MET  12/01/23   LONG TERM GOALS: Target date: 12/31/2023   Pt will be able to perform head/neck and postural strengthening with SBA Baseline: pt and caregiver have reported understanding Goal status: IN PROGRESS 12/22/23  2.  Pt will be able to maintain neutral neck for at least 10 min for ADLs Baseline: Unable; unchanged 12/22/23 Goal status: IN PROGRESS 12/22/23  3.  Pt will report improved PSFS by >/=7 to demo MCID Baseline: 5; 7 12/22/23 Goal status: MET 0/21/25  4.  Pt will have improved 5x STS to </=20 sec to demo increased functional LE strength Baseline: 25.84 sec; 19.17 sec without letting go of armrests fully 12/22/23 Goal status: IN PROGRESS  12/22/23  5.  Pt will have improved TUG to </=15 sec to decrease fall risk Baseline: 23.06 sec; 23.93 sec with RW 12/22/23 Goal status: IN PROGRESS 12/22/23    ASSESSMENT:  CLINICAL IMPRESSION: NU-step resistance intervals for force production w/ good maintenance of SPM. Balance activities to improve postural control with tasks initiated from BUE to no UE support with retropulsion/LOB 25-50% of the time when no UE support afforded. Difficulty with compliant surfaces for any length of time without UE support retro LOB 100% trials. Dynamic balance activities to facilitate righting reactions and single limb support to reduce risk for falls during transfers and gait. Would benefit from continued sessions to improve mobility and reduce fall risk and provide cargeiver training to aid in carryover for mobility/balance practices OBJECTIVE IMPAIRMENTS: Abnormal gait, decreased activity tolerance, decreased balance, decreased endurance, decreased mobility, difficulty walking, decreased ROM, decreased strength, decreased safety awareness, hypomobility, increased fascial restrictions, increased muscle spasms, impaired flexibility, impaired UE functional use, improper body mechanics, postural dysfunction, and pain.   ACTIVITY LIMITATIONS: carrying,  lifting, bending, sitting, standing, squatting, stairs, transfers, bed mobility, bathing, toileting, dressing, reach over head, hygiene/grooming, and locomotion level  PARTICIPATION LIMITATIONS: meal prep, cleaning, laundry, medication management, driving, shopping, community activity, and yard work  PERSONAL FACTORS: Age, Fitness, Past/current experiences, and Time since onset of injury/illness/exacerbation  are also affecting patient's functional outcome.   REHAB POTENTIAL: Good  CLINICAL DECISION MAKING: Evolving/moderate complexity  EVALUATION COMPLEXITY: Moderate  PLAN:  PT FREQUENCY: 2x/week  PT DURATION: 8 weeks  PLANNED INTERVENTIONS: 97164- PT Re-evaluation, 97110-Therapeutic exercises, 97530- Therapeutic activity, 97112- Neuromuscular re-education, 97535- Self Care, [id number]- Manual therapy, [phone]- Gait training, [phone]- Aquatic Therapy, [phone]- Electrical stimulation (unattended), [phone] (1-2 muscles), [id number] (3+ muscles)- Dry Needling, Patient/Family education, Balance training, Stair training, Taping, Joint mobilization, Spinal mobilization, Cryotherapy, and Moist heat  PLAN FOR NEXT SESSION: Balance and turning work with RW.  Continue STS without UE technique. Work on deep neck muscle strength/endurance. Cervical/thoracic extension in gravity eliminated position to start and then slowly progress to against gravity. Posterior chain strengthening and gross balance.    9:43 AM, 12/31/23 M. Kelly Tylisa Alcivar, PT, DPT Physical Therapist- [place] Office Number: [phone]

## 2024-01-01 DIAGNOSIS — I10 Essential (primary) hypertension: Secondary | ICD-10-CM | POA: Diagnosis not present

## 2024-01-01 DIAGNOSIS — E7800 Pure hypercholesterolemia, unspecified: Secondary | ICD-10-CM | POA: Diagnosis not present

## 2024-01-05 ENCOUNTER — Encounter: Payer: Self-pay | Admitting: Physical Therapy

## 2024-01-05 ENCOUNTER — Ambulatory Visit: Attending: Neurology | Admitting: Physical Therapy

## 2024-01-05 DIAGNOSIS — R293 Abnormal posture: Secondary | ICD-10-CM | POA: Insufficient documentation

## 2024-01-05 DIAGNOSIS — R2681 Unsteadiness on feet: Secondary | ICD-10-CM | POA: Insufficient documentation

## 2024-01-05 DIAGNOSIS — R2689 Other abnormalities of gait and mobility: Secondary | ICD-10-CM | POA: Insufficient documentation

## 2024-01-05 DIAGNOSIS — M6281 Muscle weakness (generalized): Secondary | ICD-10-CM | POA: Insufficient documentation

## 2024-01-05 DIAGNOSIS — R269 Unspecified abnormalities of gait and mobility: Secondary | ICD-10-CM | POA: Insufficient documentation

## 2024-01-05 NOTE — Therapy (Signed)
 OUTPATIENT PHYSICAL THERAPY TREATMENT NOTE   Patient Name: Cody Dickerson MRN: [id number] DOB:Sep 09, 1950, 73 y.o., male Today's Date: 01/05/2024   PCP: Okey Carlin Redbird, MD REFERRING PROVIDER: Tat, Asberry RAMAN, DO   END OF SESSION:  PT End of Session - 01/05/24 0936     Visit Number 13    Number of Visits 18    Date for Recertification  01/28/24    Authorization Type Medicare/AARP-[place]!!!!!!!    Progress Note Due on Visit 20    PT Start Time 0936    PT Stop Time 1016    PT Time Calculation (min) 40 min    Equipment Utilized During Treatment Gait belt    Activity Tolerance Patient tolerated treatment well    Behavior During Therapy [facility] for tasks assessed/performed;Impulsive                 Past Medical History:  Diagnosis Date   Balance problem    Gout    Hypercholesterolemia    Hypertension    Parkinson's disease (HCC)    Small bowel obstruction (HCC)    Past Surgical History:  Procedure Laterality Date   BOWEL BLOCKAGE  05/2019   IR ANGIO INTRA EXTRACRAN SEL COM CAROTID INNOMINATE BILAT MOD SED  10/03/2021   IR ANGIO VERTEBRAL SEL VERTEBRAL UNI R MOD SED  10/03/2021   IR RADIOLOGIST EVAL & MGMT  08/19/2021   IR US  GUIDE VASC ACCESS RIGHT  10/04/2021   Patient Active Problem List   Diagnosis Date Noted   Stenosis of right carotid artery 08/14/2023   Orthostatic hypotension 08/14/2023   Supine hypertension 08/14/2023   Hypercholesteremia 08/14/2023   Pain due to onychomycosis of toenails of both feet 09/03/2022   Porokeratosis 06/04/2022   Parkinson's disease (HCC) 02/06/2021   SBO (small bowel obstruction) (HCC) 07/06/2019    ONSET DATE: 3-4 years  REFERRING DIAG: R53.1 (ICD-10-CM) - Strength loss of R26.9 (ICD-10-CM) - Gait difficulty R26.89 (ICD-10-CM) - Balance problem Z74.09 (ICD-10-CM) - Impaired functional mobility, balance, and endurance  THERAPY DIAG:  Muscle weakness (generalized)  Abnormal posture  Unsteadiness on feet  Other  abnormalities of gait and mobility  Rationale for Evaluation and Treatment: Rehabilitation  SUBJECTIVE:                                                                                                                                                                                             SUBJECTIVE STATEMENT: Had a fall out of my wheelchair yesterday-bent down to reach something from the floor, didn't have brakes locked, and the wheelchair slid back, I slid down to floor.  I want  to work on making sure I have good flexibility, motion in my (borrowed wheelchair at home).  Pt accompanied by: Caregiver- Mary   PERTINENT HISTORY: Parkinson's, Left distal supraclinoid ICA aneurysm, mild cognitive impairment Has been doing HHPT 1x/wk  PAIN:  Are you having pain? Yes: NPRS scale: 0/10 Pain location: Base of neck Pain description: like something's in there, pinching Aggravating factors: holding head down Relieving factors: Advil   PRECAUTIONS: None  RED FLAGS: None   WEIGHT BEARING RESTRICTIONS: No  FALLS: Has patient fallen in last 6 months? Yes. Number of falls last fell out of chair ~1.5 weeks ago; 2-3 falls   LIVING ENVIRONMENT: Lives with: lives with their family and Has a caregiver present 8a-12p daily; Wife is otherwise present Lives in: House/apartment Stairs: 3 steps to enter/exit Has following equipment at home: Vannie - 2 wheeled and Wheelchair (manual)  PLOF: Needs assistance with ADLs and Needs assistance with gait  PATIENT GOALS: Improve neck posture and balance  OBJECTIVE:    TODAY'S TREATMENT: 01/05/2024 Activity Comments  Nu Step, Resisted intervals, 4 extremities x 10  minutes Level 4/Level 6, SPM >90  With pt's initial attempt SPT w/c to Nustep, he tries to go before brakes are locked.  PT cues pt for sequence and safety of transfer.  Dynamic balance: Step strategy work- back, forward Forward/back step strategy with added head motions up/down, min guard  Standing at [employer] mobility, safety education -locking/unlocking brakes -scooting to edge of chair -squat pivot transfer to R/to L, multiple reps with supervision and cues   Sit to stand from mat surface, 3 reps Cues for foot placement, safe transfer technique                                                                                                                                   PATIENT EDUCATION: Education details: Discussed w/c mobility, transfers, safety for use of brakes.  Discussed w/c options, given he has order in chart for w/c-He is looking into [facility] and PT discusses option of [facility] for rental of manual w/c.  Placed tennis balls on his walker legs, as the rubber stoppers are all the way to the metal legs; discussed getting new rubber stoppers Person educated: Patient, caregiver Education method: Explanation Education comprehension: verbalized understanding    HOME EXERCISE PROGRAM: Access Code: [email or web address] URL: [email or web address] Date: 11/26/2023 Prepared by: Gellen April Earnie Starring  Exercises - Seated Thoracic Lumbar Extension with Pectoralis Stretch  - 3 x daily - 7 x weekly - 2-3 sets - 10 reps - Seated Cervical Retraction Extension Passive Loaded  - 3 x daily - 7 x weekly - 2-3 sets - 10 reps - Seated Cervical Retraction  - 1 x daily - 7 x weekly - 2 sets - 5 reps - 5 sec hold - Seated Diagonal Chops with Medicine Ball  - 1 x daily - 7 x weekly - 2 sets -  10 reps - Supine Isometric Neck Extension  - 1 x daily - 7 x weekly - 3 sets - 10 reps - 2 sec hold      Note: Objective measures were completed at Evaluation unless otherwise noted.  DIAGNOSTIC FINDINGS: n/a  COGNITION: Overall cognitive status: History of cognitive impairments - at baseline  COORDINATION: Tremors present throughout  POSTURE: rounded shoulders, forward head, and increased thoracic kyphosis  CERVICAL ROM: At rest flexed ~40 deg  Active  ROM A/PROM (deg) eval  Flexion Can get chin to chest  Extension -40 to -20 deg (unable to go into full extension)  Right lateral flexion 40  Left lateral flexion 20  Right rotation 35  Left rotation 40   (Blank rows = not tested)   LOWER EXTREMITY ROM:   WNL  LOWER EXTREMITY MMT:    MMT Right Eval Left Eval  Hip flexion 5 5  Hip extension 4 4  Hip abduction 3+ 3+  Hip adduction    Hip internal rotation    Hip external rotation    Knee flexion 4- 3+  Knee extension 5 5  Ankle dorsiflexion    Ankle plantarflexion    Ankle inversion    Ankle eversion    (Blank rows = not tested)  BED MOBILITY:  Findings: Sit to supine SBA Supine to sit SBA Multiple small scooting motions  TRANSFERS: Sit to stand: Min A  Assistive device utilized: Environmental Consultant - 2 wheeled     Stand to sit: CGA and Min A  Assistive device utilized: Environmental Consultant - 2 wheeled     Min A for stability -- will at times require multiple attempts to come to standing  GAIT: Findings: Gait Characteristics: Festination at times, step through pattern, decreased step length- Right, decreased step length- Left, decreased stance time- Right, decreased stance time- Left, shuffling, trunk flexed, poor foot clearance- Right, and poor foot clearance- Left, Distance walked: ~10', and Comments: RW  FUNCTIONAL TESTS:  5 times sit to stand: 25.84 sec -- uses UEs to push off mat table, legs braced against table to stand, mild tremors/unsteadiness, requires a few attempts to obtain momentum to stand Timed up and go (TUG): 23.06 sec -- shuffling with turns, diminished step length, required 2 attempts to use momentum to stand  PATIENT SURVEYS:  PSFS: THE PATIENT SPECIFIC FUNCTIONAL SCALE  Place score of 0-10 (0 = unable to perform activity and 10 = able to perform activity at the same level as before injury or problem)  Activity Date: 11/05/23    Holding head up 5    2.  Standing balance  5    3.       4.      Total Score 5      Total  Score = Sum of activity scores/number of activities  Minimally Detectable Change: 3 points (for single activity); 2 points (for average score)  [facility] (nd). The Patient Specific Functional Scale . Retrieved from [email or web address]  GOALS: Goals reviewed with patient? Yes  SHORT TERM GOALS: Target date: 12/03/2023   Pt will be able to perform HEP with caregiver min A Baseline: Goal status: MET 12/01/23  2.  Pt will be able to maintain neck extension to at least within 10 deg of neutral Baseline: pt is 40 to 20 deg away from neutral; 17 degrees flexion. Able to get to actively get to neutral neck posture 9.30.25 Goal status: NOT MET 12/01/23   LONG TERM GOALS: Target date: 12/31/2023>01/28/2024 UPTDATED)   Pt will be able to perform head/neck and postural strengthening with SBA Baseline: pt and caregiver have reported understanding Goal status: IN PROGRESS 12/22/23  2.  Pt will be able to maintain neutral neck for at least 10 min for ADLs Baseline: Unable; unchanged 12/22/23 Goal status: IN PROGRESS 12/22/23  3.  Pt will report improved PSFS by >/=7 to demo MCID Baseline: 5; 7 12/22/23 Goal status: MET 12/22/23  4.  Pt will have improved 5x STS to </=20 sec to demo increased functional LE strength Baseline: 25.84 sec; 19.17 sec without letting go of armrests fully 12/22/23 Goal status: IN PROGRESS  12/22/23  5.  Pt will have improved TUG to </=15 sec to decrease fall risk Baseline: 23.06 sec; 23.93 sec with RW 12/22/23 Goal status: IN PROGRESS 12/22/23    ASSESSMENT:  CLINICAL IMPRESSION: Pt presents today and reports he had a fall from his (borrowed) wheelchair in his home yesterday, bending down to pick up something from the floor.  He does note that the brakes were not  locked.   Skilled PT session focused on safety education in regards to w/c mobility, transfers, and use of brakes/technique for safe squat pivot transfers.  He is borrowing a heavier w/c for home use and typically use clinic w/c while here for PT; he has not yet obtained a lighter weight w/c for home.  Included caregiver in education at end of session.  Pt will continue to benefit from skilled PT towards goals for improved functional mobility and decreased fall risk.  OBJECTIVE IMPAIRMENTS: Abnormal gait, decreased activity tolerance, decreased balance, decreased endurance, decreased mobility, difficulty walking, decreased ROM, decreased strength, decreased safety awareness, hypomobility, increased fascial restrictions, increased muscle spasms, impaired flexibility, impaired UE functional use, improper body mechanics, postural dysfunction, and pain.   ACTIVITY LIMITATIONS: carrying, lifting, bending, sitting, standing, squatting, stairs, transfers, bed mobility, bathing, toileting, dressing, reach over head, hygiene/grooming, and locomotion level  PARTICIPATION LIMITATIONS: meal prep, cleaning, laundry, medication management, driving, shopping, community activity, and yard work  PERSONAL FACTORS: Age, Fitness, Past/current experiences, and Time since onset of injury/illness/exacerbation are also affecting patient's functional outcome.   REHAB POTENTIAL: Good  CLINICAL DECISION MAKING: Evolving/moderate complexity  EVALUATION COMPLEXITY: Moderate  PLAN:  PT FREQUENCY: 2x/week  PT DURATION: 8 weeks  PLANNED INTERVENTIONS: 97164- PT Re-evaluation, 97110-Therapeutic exercises, 97530- Therapeutic activity, 97112- Neuromuscular re-education, 97535- Self Care, 02859- Manual therapy, [phone]- Gait training, [phone]- Aquatic Therapy, [phone]- Electrical stimulation (unattended), [phone] (1-2 muscles), 20561 (3+ muscles)- Dry Needling, Patient/Family education, Balance training, Stair training, Taping, Joint  mobilization, Spinal mobilization, Cryotherapy, and Moist heat  PLAN FOR NEXT SESSION: Step strategy work; consider updating HEP as appropriate.  NEED to follow up on pt's obtaining w/c for home.  Balance and turning work with RW.  Continue STS without UE technique. Work on deep neck muscle strength/endurance.  Posterior chain strengthening and gross balance.    Greig Anon, PT 01/05/24 12:00 PM Phone: [phone] Fax: [phone]  [facility] at [facility] [address] [place], [place] [address] Phone # [phone] Fax # [phone]

## 2024-01-07 ENCOUNTER — Ambulatory Visit: Admitting: Physical Therapy

## 2024-01-07 ENCOUNTER — Encounter: Payer: Self-pay | Admitting: Physical Therapy

## 2024-01-07 DIAGNOSIS — R293 Abnormal posture: Secondary | ICD-10-CM | POA: Diagnosis not present

## 2024-01-07 DIAGNOSIS — R2689 Other abnormalities of gait and mobility: Secondary | ICD-10-CM

## 2024-01-07 DIAGNOSIS — M6281 Muscle weakness (generalized): Secondary | ICD-10-CM

## 2024-01-07 DIAGNOSIS — R2681 Unsteadiness on feet: Secondary | ICD-10-CM

## 2024-01-07 DIAGNOSIS — R269 Unspecified abnormalities of gait and mobility: Secondary | ICD-10-CM | POA: Diagnosis not present

## 2024-01-07 NOTE — Therapy (Signed)
 OUTPATIENT PHYSICAL THERAPY TREATMENT NOTE   Patient Name: Cody Dickerson MRN: [id number] DOB:[birth year], 73 y.o., male Today's Date: 01/07/2024   PCP: Okey Carlin Redbird, MD REFERRING PROVIDER: Tat, Asberry RAMAN, DO   END OF SESSION:  PT End of Session - 01/07/24 0928     Visit Number 14    Number of Visits 18    Date for Recertification  01/28/24    Authorization Type Medicare/AARP-[place]!!!!!!!    Progress Note Due on Visit 20    PT Start Time 0932    PT Stop Time 1014    PT Time Calculation (min) 42 min    Equipment Utilized During Treatment Gait belt    Activity Tolerance Patient tolerated treatment well    Behavior During Therapy WFL for tasks assessed/performed;Impulsive                  Past Medical History:  Diagnosis Date   Balance problem    Gout    Hypercholesterolemia    Hypertension    Parkinson's disease (HCC)    Small bowel obstruction (HCC)    Past Surgical History:  Procedure Laterality Date   BOWEL BLOCKAGE  05/2019   IR ANGIO INTRA EXTRACRAN SEL COM CAROTID INNOMINATE BILAT MOD SED  10/03/2021   IR ANGIO VERTEBRAL SEL VERTEBRAL UNI R MOD SED  10/03/2021   IR RADIOLOGIST EVAL & MGMT  08/19/2021   IR US  GUIDE VASC ACCESS RIGHT  10/04/2021   Patient Active Problem List   Diagnosis Date Noted   Stenosis of right carotid artery 08/14/2023   Orthostatic hypotension 08/14/2023   Supine hypertension 08/14/2023   Hypercholesteremia 08/14/2023   Pain due to onychomycosis of toenails of both feet 09/03/2022   Porokeratosis 06/04/2022   Parkinson's disease (HCC) 02/06/2021   SBO (small bowel obstruction) (HCC) 07/06/2019    ONSET DATE: 3-4 years  REFERRING DIAG: R53.1 (ICD-10-CM) - Strength loss of R26.9 (ICD-10-CM) - Gait difficulty R26.89 (ICD-10-CM) - Balance problem Z74.09 (ICD-10-CM) - Impaired functional mobility, balance, and endurance  THERAPY DIAG:  Muscle weakness (generalized)  Abnormal posture  Unsteadiness on feet  Other  abnormalities of gait and mobility  Rationale for Evaluation and Treatment: Rehabilitation  SUBJECTIVE:                                                                                                                                                                                             SUBJECTIVE STATEMENT: No more falls, have not done any more research on the wheelchair.  Pt accompanied by: Caregiver- Mary   PERTINENT HISTORY: Parkinson's, Left distal supraclinoid ICA aneurysm, mild cognitive impairment  Has been doing HHPT 1x/wk  PAIN:  Are you having pain? Yes: NPRS scale: 0/10 Pain location: Base of neck Pain description: like something's in there, pinching Aggravating factors: holding head down Relieving factors: Advil   PRECAUTIONS: None  RED FLAGS: None   WEIGHT BEARING RESTRICTIONS: No  FALLS: Has patient fallen in last 6 months? Yes. Number of falls last fell out of chair ~1.5 weeks ago; 2-3 falls   LIVING ENVIRONMENT: Lives with: lives with their family and Has a caregiver present 8a-12p daily; Wife is otherwise present Lives in: House/apartment Stairs: 3 steps to enter/exit Has following equipment at home: Vannie - 2 wheeled and Wheelchair (manual)  PLOF: Needs assistance with ADLs and Needs assistance with gait  PATIENT GOALS: Improve neck posture and balance  OBJECTIVE:   Doing PWR! Moves with hands behind head, chin tuck/neck ex, kicks with bands/without bands. TODAY'S TREATMENT: 01/07/2024 Activity Comments  Nu Step, resisted intervals, 4 extremities x 8 minutes Level 4, Level 6, SPM > 90  Gait 50 ft x 4 with RW Cues for larger steps, looking ahead at target  Standing at counter-weightshift, reach, posture awareness Forward/back weightshift through hips for lumbar stretch To targets  Forward lean to w/c push up 10 reps  For forward lean and full WB on BLEs  Dynamic balance: Step strategy work- back, forward Forward/back step strategy with added  head motions up/down, min guard At counter, 2 x 10 reps, with added obstacle to step over  Standing on [employer]: Heel raises x 10 Step taps alternating to side x 10, alternating back x 10 Standing with wider BOS with alt UE reaches to targets   Cues for looking up/looking ahead                                                                                                                                        PATIENT EDUCATION: Education details: Continue current HEP Person educated: Patient, caregiver Education method: Explanation Education comprehension: verbalized understanding    HOME EXERCISE PROGRAM: Access Code: [email or web address] URL: [email or web address] Date: 11/26/2023 Prepared by: Gellen April Earnie Starring  Exercises - Seated Thoracic Lumbar Extension with Pectoralis Stretch  - 3 x daily - 7 x weekly - 2-3 sets - 10 reps - Seated Cervical Retraction Extension Passive Loaded  - 3 x daily - 7 x weekly - 2-3 sets - 10 reps - Seated Cervical Retraction  - 1 x daily - 7 x weekly - 2 sets - 5 reps - 5 sec hold - Seated Diagonal Chops with Medicine Ball  - 1 x daily - 7 x weekly - 2 sets - 10 reps - Supine Isometric Neck Extension  - 1 x daily - 7 x weekly - 3 sets - 10 reps - 2 sec hold      Note: Objective measures were completed at Evaluation unless otherwise noted.  DIAGNOSTIC FINDINGS: n/a  COGNITION: Overall cognitive  status: History of cognitive impairments - at baseline  COORDINATION: Tremors present throughout  POSTURE: rounded shoulders, forward head, and increased thoracic kyphosis  CERVICAL ROM: At rest flexed ~40 deg  Active ROM A/PROM (deg) eval  Flexion Can get chin to chest  Extension -40 to -20 deg (unable to go into full extension)  Right lateral flexion 40  Left lateral flexion 20  Right rotation 35  Left rotation 40   (Blank rows = not tested)   LOWER EXTREMITY ROM:   WNL  LOWER EXTREMITY MMT:    MMT Right Eval  Left Eval  Hip flexion 5 5  Hip extension 4 4  Hip abduction 3+ 3+  Hip adduction    Hip internal rotation    Hip external rotation    Knee flexion 4- 3+  Knee extension 5 5  Ankle dorsiflexion    Ankle plantarflexion    Ankle inversion    Ankle eversion    (Blank rows = not tested)  BED MOBILITY:  Findings: Sit to supine SBA Supine to sit SBA Multiple small scooting motions  TRANSFERS: Sit to stand: Min A  Assistive device utilized: Environmental Consultant - 2 wheeled     Stand to sit: CGA and Min A  Assistive device utilized: Environmental Consultant - 2 wheeled     Min A for stability -- will at times require multiple attempts to come to standing  GAIT: Findings: Gait Characteristics: Festination at times, step through pattern, decreased step length- Right, decreased step length- Left, decreased stance time- Right, decreased stance time- Left, shuffling, trunk flexed, poor foot clearance- Right, and poor foot clearance- Left, Distance walked: ~10', and Comments: RW  FUNCTIONAL TESTS:  5 times sit to stand: 25.84 sec -- uses UEs to push off mat table, legs braced against table to stand, mild tremors/unsteadiness, requires a few attempts to obtain momentum to stand Timed up and go (TUG): 23.06 sec -- shuffling with turns, diminished step length, required 2 attempts to use momentum to stand  PATIENT SURVEYS:  PSFS: THE PATIENT SPECIFIC FUNCTIONAL SCALE  Place score of 0-10 (0 = unable to perform activity and 10 = able to perform activity at the same level as before injury or problem)  Activity Date: 11/05/23    Holding head up 5    2.  Standing balance  5    3.       4.      Total Score 5      Total Score = Sum of activity scores/number of activities  Minimally Detectable Change: 3 points (for single activity); 2 points (for average score)  [facility] (nd). The Patient Specific Functional Scale . Retrieved from [email or web address]                                                                                                                                   GOALS: Goals reviewed with patient? Yes  SHORT  TERM GOALS: Target date: 12/03/2023   Pt will be able to perform HEP with caregiver min A Baseline: Goal status: MET 12/01/23  2.  Pt will be able to maintain neck extension to at least within 10 deg of neutral Baseline: pt is 40 to 20 deg away from neutral; 17 degrees flexion. Able to get to actively get to neutral neck posture 9.30.25 Goal status: NOT MET 12/01/23   LONG TERM GOALS: Target date: 12/31/2023>01/28/2024 UPTDATED)   Pt will be able to perform head/neck and postural strengthening with SBA Baseline: pt and caregiver have reported understanding Goal status: IN PROGRESS 12/22/23  2.  Pt will be able to maintain neutral neck for at least 10 min for ADLs Baseline: Unable; unchanged 12/22/23 Goal status: IN PROGRESS 12/22/23  3.  Pt will report improved PSFS by >/=7 to demo MCID Baseline: 5; 7 12/22/23 Goal status: MET 12/22/23  4.  Pt will have improved 5x STS to </=20 sec to demo increased functional LE strength Baseline: 25.84 sec; 19.17 sec without letting go of armrests fully 12/22/23 Goal status: IN PROGRESS  12/22/23  5.  Pt will have improved TUG to </=15 sec to decrease fall risk Baseline: 23.06 sec; 23.93 sec with RW 12/22/23 Goal status: IN PROGRESS 12/22/23    ASSESSMENT:  CLINICAL IMPRESSION: Pt presents today with no new complaints. Skilled PT session focused on aerobic warm up with Nustep + standing balance and gait.  With balance and gait activities, pt continues to have significant forward head posture and is able to lift briefly with cues to do so.   He needs cues to slow pace for longer strides and for foot clearance on [employer] balance.  Pt will continue to benefit from skilled PT towards goals for improved functional mobility and decreased fall risk.  OBJECTIVE IMPAIRMENTS:  Abnormal gait, decreased activity tolerance, decreased balance, decreased endurance, decreased mobility, difficulty walking, decreased ROM, decreased strength, decreased safety awareness, hypomobility, increased fascial restrictions, increased muscle spasms, impaired flexibility, impaired UE functional use, improper body mechanics, postural dysfunction, and pain.   ACTIVITY LIMITATIONS: carrying, lifting, bending, sitting, standing, squatting, stairs, transfers, bed mobility, bathing, toileting, dressing, reach over head, hygiene/grooming, and locomotion level  PARTICIPATION LIMITATIONS: meal prep, cleaning, laundry, medication management, driving, shopping, community activity, and yard work  PERSONAL FACTORS: Age, Fitness, Past/current experiences, and Time since onset of injury/illness/exacerbation are also affecting patient's functional outcome.   REHAB POTENTIAL: Good  CLINICAL DECISION MAKING: Evolving/moderate complexity  EVALUATION COMPLEXITY: Moderate  PLAN:  PT FREQUENCY: 2x/week  PT DURATION: 8 weeks  PLANNED INTERVENTIONS: 97164- PT Re-evaluation, 97110-Therapeutic exercises, 97530- Therapeutic activity, 97112- Neuromuscular re-education, 97535- Self Care, 02859- Manual therapy, [phone]- Gait training, [phone]- Aquatic Therapy, [phone]- Electrical stimulation (unattended), [phone] (1-2 muscles), 20561 (3+ muscles)- Dry Needling, Patient/Family education, Balance training, Stair training, Taping, Joint mobilization, Spinal mobilization, Cryotherapy, and Moist heat  PLAN FOR NEXT SESSION: *Pt's last scheduled appts are 11/11 and 11/13-need to reconcile with POC.  Step strategy work at [employer] and consider adding to [employer].  NEED to follow up on pt's obtaining w/c for home.  Balance and turning work with RW.  Continue STS without UE technique. Work on deep neck muscle strength/endurance.  Posterior chain strengthening and gross balance.    Greig Anon, PT 01/07/24 10:16 AM Phone:  [phone] Fax: [phone]  [facility] at [facility] [address] [place], [address] [place], [place] [address] Phone # [phone] Fax # [phone]

## 2024-01-08 ENCOUNTER — Telehealth: Payer: Self-pay | Admitting: Neurology

## 2024-01-08 NOTE — Telephone Encounter (Signed)
 Called patient back.

## 2024-01-08 NOTE — Telephone Encounter (Signed)
 Pt called and lefted a message to return call. Thanks

## 2024-01-08 NOTE — Telephone Encounter (Signed)
 Called patient and left message.

## 2024-01-08 NOTE — Telephone Encounter (Signed)
 Pt called in and he stated that he needed to speak to you for a moment. Thanks

## 2024-01-11 ENCOUNTER — Telehealth: Payer: Self-pay | Admitting: Neurology

## 2024-01-11 NOTE — Telephone Encounter (Signed)
 Pt called in this afternoon and he wants you to return his call. Thanks

## 2024-01-11 NOTE — Telephone Encounter (Signed)
 [facility] call ID: [id number]  Caller: Tyller Bowlby; Self  PH: [phone]  Reason for the call: Returning call from the office. Would like a call back Monday.

## 2024-01-11 NOTE — Telephone Encounter (Signed)
 Per patient wanted to advise That he got his wheelchair purchase order.   Patient haven't heard from [employer].

## 2024-01-12 ENCOUNTER — Telehealth: Payer: Self-pay | Admitting: Neurology

## 2024-01-12 ENCOUNTER — Ambulatory Visit

## 2024-01-12 DIAGNOSIS — R293 Abnormal posture: Secondary | ICD-10-CM | POA: Diagnosis not present

## 2024-01-12 DIAGNOSIS — R2689 Other abnormalities of gait and mobility: Secondary | ICD-10-CM | POA: Diagnosis not present

## 2024-01-12 DIAGNOSIS — R269 Unspecified abnormalities of gait and mobility: Secondary | ICD-10-CM | POA: Diagnosis not present

## 2024-01-12 DIAGNOSIS — R2681 Unsteadiness on feet: Secondary | ICD-10-CM | POA: Diagnosis not present

## 2024-01-12 DIAGNOSIS — M6281 Muscle weakness (generalized): Secondary | ICD-10-CM

## 2024-01-12 NOTE — Telephone Encounter (Signed)
 Called patient and he wants me to fax his wheelchair DME in for him to [phone]

## 2024-01-12 NOTE — Telephone Encounter (Signed)
 Pt(Cody Dickerson) called in wanting to speak with [facility].  PH: [phone]

## 2024-01-12 NOTE — Therapy (Signed)
 OUTPATIENT PHYSICAL THERAPY TREATMENT NOTE, Progress Note and Recertification   Patient Name: Cody Dickerson MRN: [id number] DOB:10/15/1950, 73 y.o., male Today's Date: 01/12/2024   PCP: Okey Carlin Redbird, MD REFERRING PROVIDER: Evonnie Asberry RAMAN, DO  Progress Note Reporting Period 12/22/23 to 01/12/24  See note below for Objective Data and Assessment of Progress/Goals.       END OF SESSION:  PT End of Session - 01/12/24 0921     Visit Number 15    Number of Visits 27    Date for Recertification  02/23/24    Authorization Type Medicare/[employer]!!!!!!!    Progress Note Due on Visit 25    PT Start Time 0920    PT Stop Time 1015    PT Time Calculation (min) 55 min    Equipment Utilized During Treatment Gait belt    Activity Tolerance Patient tolerated treatment well    Behavior During Therapy WFL for tasks assessed/performed;Impulsive                  Past Medical History:  Diagnosis Date   Balance problem    Gout    Hypercholesterolemia    Hypertension    Parkinson's disease (HCC)    Small bowel obstruction (HCC)    Past Surgical History:  Procedure Laterality Date   BOWEL BLOCKAGE  05/2019   IR ANGIO INTRA EXTRACRAN SEL COM CAROTID INNOMINATE BILAT MOD SED  10/03/2021   IR ANGIO VERTEBRAL SEL VERTEBRAL UNI R MOD SED  10/03/2021   IR RADIOLOGIST EVAL & MGMT  08/19/2021   IR US  GUIDE VASC ACCESS RIGHT  10/04/2021   Patient Active Problem List   Diagnosis Date Noted   Stenosis of right carotid artery 08/14/2023   Orthostatic hypotension 08/14/2023   Supine hypertension 08/14/2023   Hypercholesteremia 08/14/2023   Pain due to onychomycosis of toenails of both feet 09/03/2022   Porokeratosis 06/04/2022   Parkinson's disease (HCC) 02/06/2021   SBO (small bowel obstruction) (HCC) 07/06/2019    ONSET DATE: 3-4 years  REFERRING DIAG: R53.1 (ICD-10-CM) - Strength loss of R26.9 (ICD-10-CM) - Gait difficulty R26.89 (ICD-10-CM) - Balance problem Z74.09  (ICD-10-CM) - Impaired functional mobility, balance, and endurance  THERAPY DIAG:  Muscle weakness (generalized)  Abnormal posture  Unsteadiness on feet  Other abnormalities of gait and mobility  Gait difficulty  Rationale for Evaluation and Treatment: Rehabilitation  SUBJECTIVE:                                                                                                                                                                                             SUBJECTIVE STATEMENT: I'd  be interested in continuing therapy if possible  Pt accompanied by: Caregiver- Mary   PERTINENT HISTORY: Parkinson's, Left distal supraclinoid ICA aneurysm, mild cognitive impairment Has been doing HHPT 1x/wk  PAIN:  Are you having pain? Yes: NPRS scale: 0/10 Pain location: Base of neck Pain description: like something's in there, pinching Aggravating factors: holding head down Relieving factors: Advil   PRECAUTIONS: None  RED FLAGS: None   WEIGHT BEARING RESTRICTIONS: No  FALLS: Has patient fallen in last 6 months? Yes. Number of falls last fell out of chair ~1.5 weeks ago; 2-3 falls   LIVING ENVIRONMENT: Lives with: lives with their family and Has a caregiver present 8a-12p daily; Wife is otherwise present Lives in: House/apartment Stairs: 3 steps to enter/exit Has following equipment at home: Vannie - 2 wheeled and Wheelchair (manual)  PLOF: Needs assistance with ADLs and Needs assistance with gait  PATIENT GOALS: Improve neck posture and balance  OBJECTIVE:   TODAY'S TREATMENT: 01/12/24 Activity Comments  NU-step level 4 x 6 min Steady state 90-100 SPM  TUG test 1) 19 sec 2)17 sec  Berg Balance 38/56  Dynamic balance 4-square step -/+ obstacles Hurdles: forwards/lateral           [facility] PT Assessment - 01/12/24 0001       Standardized Balance Assessment   Standardized Balance Assessment Berg Balance Test      Berg Balance Test   Sit to Stand Able to stand  using hands after several tries    Standing Unsupported Able to stand safely 2 minutes    Sitting with Back Unsupported but Feet Supported on Floor or Stool Able to sit safely and securely 2 minutes    Stand to Sit Sits safely with minimal use of hands    Transfers Able to transfer safely, definite need of hands    Standing Unsupported with Eyes Closed Able to stand 10 seconds with supervision    Standing Unsupported with Feet Together Able to place feet together independently and stand for 1 minute with supervision    From Standing, Reach Forward with Outstretched Arm Can reach confidently >25 cm (10)    From Standing Position, Pick up Object from Floor Able to pick up shoe safely and easily    From Standing Position, Turn to Look Behind Over each Shoulder Needs supervision when turning    Turn 360 Degrees Needs assistance while turning    Standing Unsupported, Alternately Place Feet on Step/Stool Able to complete 4 steps without aid or supervision    Standing Unsupported, One Foot in Front Able to plae foot ahead of the other independently and hold 30 seconds    Standing on One Leg Tries to lift leg/unable to hold 3 seconds but remains standing independently    Total Score 38          Doing PWR! Moves with hands behind head, chin tuck/neck ex, kicks with bands/without bands. TODAY'S TREATMENT: 01/07/2024 Activity Comments  Nu Step, resisted intervals, 4 extremities x 8 minutes Level 4, Level 6, SPM > 90  Gait 50 ft x 4 with RW Cues for larger steps, looking ahead at target  Standing at counter-weightshift, reach, posture awareness Forward/back weightshift through hips for lumbar stretch To targets  Forward lean to w/c push up 10 reps  For forward lean and full WB on BLEs  Dynamic balance: Step strategy work- back, forward Forward/back step strategy with added head motions up/down, min guard At counter, 2 x 10 reps, with added obstacle to step  over  Standing on Airex: Heel raises x  10 Step taps alternating to side x 10, alternating back x 10 Standing with wider BOS with alt UE reaches to targets   Cues for looking up/looking ahead                                                                                                                                        PATIENT EDUCATION: Education details: Continue current HEP Person educated: Patient, caregiver Education method: Explanation Education comprehension: verbalized understanding    HOME EXERCISE PROGRAM: Access Code: [email or web address] URL: [email or web address] Date: 11/26/2023 Prepared by: Gellen April Earnie Starring  Exercises - Seated Thoracic Lumbar Extension with Pectoralis Stretch  - 3 x daily - 7 x weekly - 2-3 sets - 10 reps - Seated Cervical Retraction Extension Passive Loaded  - 3 x daily - 7 x weekly - 2-3 sets - 10 reps - Seated Cervical Retraction  - 1 x daily - 7 x weekly - 2 sets - 5 reps - 5 sec hold - Seated Diagonal Chops with Medicine Ball  - 1 x daily - 7 x weekly - 2 sets - 10 reps - Supine Isometric Neck Extension  - 1 x daily - 7 x weekly - 3 sets - 10 reps - 2 sec hold      Note: Objective measures were completed at Evaluation unless otherwise noted.  DIAGNOSTIC FINDINGS: n/a  COGNITION: Overall cognitive status: History of cognitive impairments - at baseline  COORDINATION: Tremors present throughout  POSTURE: rounded shoulders, forward head, and increased thoracic kyphosis  CERVICAL ROM: At rest flexed ~40 deg  Active ROM A/PROM (deg) eval  Flexion Can get chin to chest  Extension -40 to -20 deg (unable to go into full extension)  Right lateral flexion 40  Left lateral flexion 20  Right rotation 35  Left rotation 40   (Blank rows = not tested)   LOWER EXTREMITY ROM:   WNL  LOWER EXTREMITY MMT:    MMT Right Eval Left Eval  Hip flexion 5 5  Hip extension 4 4  Hip abduction 3+ 3+  Hip adduction    Hip internal rotation    Hip external  rotation    Knee flexion 4- 3+  Knee extension 5 5  Ankle dorsiflexion    Ankle plantarflexion    Ankle inversion    Ankle eversion    (Blank rows = not tested)  BED MOBILITY:  Findings: Sit to supine SBA Supine to sit SBA Multiple small scooting motions  TRANSFERS: Sit to stand: Min A  Assistive device utilized: Environmental Consultant - 2 wheeled     Stand to sit: CGA and Min A  Assistive device utilized: Environmental Consultant - 2 wheeled     Min A for stability -- will at times require multiple attempts to come to standing  GAIT: Findings: Gait Characteristics: Festination at times, step through pattern,  decreased step length- Right, decreased step length- Left, decreased stance time- Right, decreased stance time- Left, shuffling, trunk flexed, poor foot clearance- Right, and poor foot clearance- Left, Distance walked: ~10', and Comments: RW  FUNCTIONAL TESTS:  5 times sit to stand: 25.84 sec -- uses UEs to push off mat table, legs braced against table to stand, mild tremors/unsteadiness, requires a few attempts to obtain momentum to stand Timed up and go (TUG): 23.06 sec -- shuffling with turns, diminished step length, required 2 attempts to use momentum to stand  PATIENT SURVEYS:  PSFS: THE PATIENT SPECIFIC FUNCTIONAL SCALE  Place score of 0-10 (0 = unable to perform activity and 10 = able to perform activity at the same level as before injury or problem)  Activity Date: 11/05/23    Holding head up 5    2.  Standing balance  5    3.       4.      Total Score 5      Total Score = Sum of activity scores/number of activities  Minimally Detectable Change: 3 points (for single activity); 2 points (for average score)  [facility] (nd). The Patient Specific Functional Scale . Retrieved from [email or web address]                                                                                                                                   GOALS: Goals reviewed with patient? Yes  SHORT TERM GOALS: Target date: 02/02/2024     Pt will be able to perform HEP with caregiver min A Baseline: Goal status: MET 12/01/23  2.  Pt will be able to maintain neck extension to at least within 10 deg of neutral Baseline: pt is 40 to 20 deg away from neutral; 17 degrees flexion. Able to get to actively get to neutral neck posture 9.30.25 Goal status: NOT MET 12/01/23  3.  Demo modified independent stand-pivot transfers from w/c to various surfaces to improve safety with maneuvering in small spaces  Baseline: CGA-SBA  Goal status: INITIAL   LONG TERM GOALS: Target date: 02/23/2024     Pt will be able to perform head/neck and postural strengthening with SBA Baseline: pt and caregiver have reported understanding Goal status: IN PROGRESS 12/22/23  2.  Pt will be able to maintain neutral neck for at least 10 min for ADLs Baseline: Unable; unchanged  Goal status: IN PROGRESS 01/12/24  3.  Pt will report improved PSFS by >/=7 to demo MCID Baseline: 5; 7 12/22/23 Goal status: MET 12/22/23  4.  Pt will have improved 5x STS to </=20 sec to demo increased functional LE strength Baseline: 25.84 sec; 19.17 sec with UE; 16 sec w/ BUE Goal status: IN PROGRESS  01/12/24  5.  Pt will have improved TUG to </=15 sec to decrease fall risk Baseline: 23.06 sec; 23.93; 17 sec Goal status: IN PROGRESS 01/12/24    6.  Improve Berg Balance Test score to 45/56 to reduce risk for falls and improve safety    Baseline: 38/56    Goal status: INITIAL   7.  Caregiver to teachback freezing of gait strategies to maintain household ambulation    Baseline: not est   Goal status: INITIAL   ASSESSMENT:  CLINICAL IMPRESSION: Pt reports interest in continued therapy sessions with emphasis on improving balance and postural control.  Review of POC details requiring CGA-SBA for transfers requiring cues for safety, position, and body mechanics.   Improved performance TUG test and 5xSTS tests but still indicating high risk for falls.  Berg Balance Test to assess standing balance with score 38/56 indicating high risk for falls.  Continued with balance/postural control activities requiring CGA-mod A to correct and prevent fall from retropulsion. Activities with emphasis on obstacle negotiation and safety with single limb support.  Pt would benefit from ongoing PT sessions to address deficits and limitations and prevent functional decline from progressive disease process.  OBJECTIVE IMPAIRMENTS: Abnormal gait, decreased activity tolerance, decreased balance, decreased endurance, decreased mobility, difficulty walking, decreased ROM, decreased strength, decreased safety awareness, hypomobility, increased fascial restrictions, increased muscle spasms, impaired flexibility, impaired UE functional use, improper body mechanics, postural dysfunction, and pain.   ACTIVITY LIMITATIONS: carrying, lifting, bending, sitting, standing, squatting, stairs, transfers, bed mobility, bathing, toileting, dressing, reach over head, hygiene/grooming, and locomotion level  PARTICIPATION LIMITATIONS: meal prep, cleaning, laundry, medication management, driving, shopping, community activity, and yard work  PERSONAL FACTORS: Age, Fitness, Past/current experiences, and Time since onset of injury/illness/exacerbation are also affecting patient's functional outcome.   REHAB POTENTIAL: Good  CLINICAL DECISION MAKING: Evolving/moderate complexity  EVALUATION COMPLEXITY: Moderate  PLAN:  PT FREQUENCY: 2x/week  PT DURATION: 6 weeks  PLANNED INTERVENTIONS: 97164- PT Re-evaluation, 97110-Therapeutic exercises, 97530- Therapeutic activity, 97112- Neuromuscular re-education, 97535- Self Care, [id number]- Manual therapy, [phone]- Gait training, [phone]- Aquatic Therapy, [phone]- Electrical stimulation (unattended), [phone] (1-2 muscles), 20561 (3+ muscles)- Dry Needling, Patient/Family  education, Balance training, Stair training, Taping, Joint mobilization, Spinal mobilization, Cryotherapy, and Moist heat  PLAN FOR NEXT SESSION:   Step strategy work at engineer, maintenance and consider adding to HEP.  NEED to follow up on pt's obtaining w/c for home.  Balance and turning work with RW.  Continue STS without UE technique. Work on deep neck muscle strength/endurance.  Posterior chain strengthening and gross balance.    10:17 AM, 01/12/24 M. Kelly Kirbie Stodghill, PT, DPT Physical Therapist- [place] Office Number: [phone]

## 2024-01-12 NOTE — Telephone Encounter (Signed)
 Pt called in this afternoon. Pt stated that he is returning your call. Thanks

## 2024-01-13 DIAGNOSIS — I10 Essential (primary) hypertension: Secondary | ICD-10-CM | POA: Diagnosis not present

## 2024-01-13 NOTE — Telephone Encounter (Signed)
 [facility] lvm: stating he is still waiting on a call.

## 2024-01-13 NOTE — Telephone Encounter (Signed)
 Faxed order to [employer]

## 2024-01-14 ENCOUNTER — Encounter: Payer: Self-pay | Admitting: Physical Therapy

## 2024-01-14 ENCOUNTER — Ambulatory Visit: Admitting: Physical Therapy

## 2024-01-14 DIAGNOSIS — R293 Abnormal posture: Secondary | ICD-10-CM

## 2024-01-14 DIAGNOSIS — R2681 Unsteadiness on feet: Secondary | ICD-10-CM | POA: Diagnosis not present

## 2024-01-14 DIAGNOSIS — M6281 Muscle weakness (generalized): Secondary | ICD-10-CM | POA: Diagnosis not present

## 2024-01-14 DIAGNOSIS — R269 Unspecified abnormalities of gait and mobility: Secondary | ICD-10-CM | POA: Diagnosis not present

## 2024-01-14 DIAGNOSIS — R2689 Other abnormalities of gait and mobility: Secondary | ICD-10-CM | POA: Diagnosis not present

## 2024-01-14 NOTE — Therapy (Signed)
 OUTPATIENT PHYSICAL THERAPY TREATMENT NOTE   Patient Name: Cody Dickerson MRN: [id number] DOB:Nov 09, 1950, 73 y.o., male Today's Date: 01/14/2024   PCP: Okey Carlin Redbird, MD REFERRING PROVIDER: Tat, Asberry RAMAN, DO      END OF SESSION:  PT End of Session - 01/14/24 0930     Visit Number 16    Number of Visits 27    Date for Recertification  02/23/24    Authorization Type Medicare/[employer]!!!!!!!    Progress Note Due on Visit 25    PT Start Time 0934    PT Stop Time 1015    PT Time Calculation (min) 41 min    Equipment Utilized During Treatment Gait belt    Activity Tolerance Patient tolerated treatment well    Behavior During Therapy [facility] for tasks assessed/performed;Impulsive                   Past Medical History:  Diagnosis Date   Balance problem    Gout    Hypercholesterolemia    Hypertension    Parkinson's disease (HCC)    Small bowel obstruction (HCC)    Past Surgical History:  Procedure Laterality Date   BOWEL BLOCKAGE  05/2019   IR ANGIO INTRA EXTRACRAN SEL COM CAROTID INNOMINATE BILAT MOD SED  10/03/2021   IR ANGIO VERTEBRAL SEL VERTEBRAL UNI R MOD SED  10/03/2021   IR RADIOLOGIST EVAL & MGMT  08/19/2021   IR US  GUIDE VASC ACCESS RIGHT  10/04/2021   Patient Active Problem List   Diagnosis Date Noted   Stenosis of right carotid artery 08/14/2023   Orthostatic hypotension 08/14/2023   Supine hypertension 08/14/2023   Hypercholesteremia 08/14/2023   Pain due to onychomycosis of toenails of both feet 09/03/2022   Porokeratosis 06/04/2022   Parkinson's disease (HCC) 02/06/2021   SBO (small bowel obstruction) (HCC) 07/06/2019    ONSET DATE: 3-4 years  REFERRING DIAG: R53.1 (ICD-10-CM) - Strength loss of R26.9 (ICD-10-CM) - Gait difficulty R26.89 (ICD-10-CM) - Balance problem Z74.09 (ICD-10-CM) - Impaired functional mobility, balance, and endurance  THERAPY DIAG:  Muscle weakness (generalized)  Abnormal posture  Unsteadiness on feet  Other  abnormalities of gait and mobility  Rationale for Evaluation and Treatment: Rehabilitation  SUBJECTIVE:                                                                                                                                                                                             SUBJECTIVE STATEMENT: No pain, no falls.  Waiting on [employer] to call back about wheelchair.   Pt accompanied by: Caregiver- Mary   PERTINENT HISTORY: Parkinson's, Left distal  supraclinoid ICA aneurysm, mild cognitive impairment Has been doing HHPT 1x/wk  PAIN:  Are you having pain? Yes: NPRS scale: 0/10 Pain location: Base of neck Pain description: like something's in there, pinching Aggravating factors: holding head down Relieving factors: Advil   PRECAUTIONS: None  RED FLAGS: None   WEIGHT BEARING RESTRICTIONS: No  FALLS: Has patient fallen in last 6 months? Yes. Number of falls last fell out of chair ~1.5 weeks ago; 2-3 falls   LIVING ENVIRONMENT: Lives with: lives with their family and Has a caregiver present 8a-12p daily; Wife is otherwise present Lives in: House/apartment Stairs: 3 steps to enter/exit Has following equipment at home: Vannie - 2 wheeled and Wheelchair (manual)  PLOF: Needs assistance with ADLs and Needs assistance with gait  PATIENT GOALS: Improve neck posture and balance  OBJECTIVE:    TODAY'S TREATMENT: 01/14/2024 Activity Comments  Sit to stand repeated practice 4 x 5 reps with min assist/min guard and cues, additional times throughout session, with instruction to caregiver To pick up yoga block from floor, then removed block and continued to use as cue  Lateral weightshift with reaching x 10, then rock and reach with lifting foot, 2 x 5 reps Min guard and cues to slow pace  Dynamic balance: Step strategy work- back, forward Forward/back step strategy with added head motions up/down, min guard Stagger stance forward/back rocking At counter, cues for  slowed pace, exaggerated motion  Gait with cues for increased step length -Starts/stops with gait, cues to step back, then forward with BIG step to start With RW and min guard                                                                                                                                             PATIENT EDUCATION: Education details: Education to patient and caregiver in sit to stand technique (using yoga block as visual cue for forward lean) and cues for caregiver where to stand to assist pt safely; ways to reduce freezing episode with back>forward stepping to start with BIG steps Person educated: Patient, caregiver Education method: Explanation, Demonstration, and Verbal cues Education comprehension: verbalized understanding, returned demonstration, verbal cues required, and needs further education    HOME EXERCISE PROGRAM: Access Code: [email or web address] URL: [email or web address] Date: 11/26/2023 Prepared by: Gellen April Earnie Starring  Exercises - Seated Thoracic Lumbar Extension with Pectoralis Stretch  - 3 x daily - 7 x weekly - 2-3 sets - 10 reps - Seated Cervical Retraction Extension Passive Loaded  - 3 x daily - 7 x weekly - 2-3 sets - 10 reps - Seated Cervical Retraction  - 1 x daily - 7 x weekly - 2 sets - 5 reps - 5 sec hold - Seated Diagonal Chops with Medicine Ball  - 1 x daily - 7 x weekly - 2 sets - 10 reps - Supine Isometric Neck Extension  -  1 x daily - 7 x weekly - 3 sets - 10 reps - 2 sec hold      Note: Objective measures were completed at Evaluation unless otherwise noted.  DIAGNOSTIC FINDINGS: n/a  COGNITION: Overall cognitive status: History of cognitive impairments - at baseline  COORDINATION: Tremors present throughout  POSTURE: rounded shoulders, forward head, and increased thoracic kyphosis  CERVICAL ROM: At rest flexed ~40 deg  Active ROM A/PROM (deg) eval  Flexion Can get chin to chest  Extension -40 to -20  deg (unable to go into full extension)  Right lateral flexion 40  Left lateral flexion 20  Right rotation 35  Left rotation 40   (Blank rows = not tested)   LOWER EXTREMITY ROM:   WNL  LOWER EXTREMITY MMT:    MMT Right Eval Left Eval  Hip flexion 5 5  Hip extension 4 4  Hip abduction 3+ 3+  Hip adduction    Hip internal rotation    Hip external rotation    Knee flexion 4- 3+  Knee extension 5 5  Ankle dorsiflexion    Ankle plantarflexion    Ankle inversion    Ankle eversion    (Blank rows = not tested)  BED MOBILITY:  Findings: Sit to supine SBA Supine to sit SBA Multiple small scooting motions  TRANSFERS: Sit to stand: Min A  Assistive device utilized: [employer] - 2 wheeled     Stand to sit: CGA and Min A  Assistive device utilized: [employer]ant - 2 wheeled     Min A for stability -- will at times require multiple attempts to come to standing  GAIT: Findings: Gait Characteristics: Festination at times, step through pattern, decreased step length- Right, decreased step length- Left, decreased stance time- Right, decreased stance time- Left, shuffling, trunk flexed, poor foot clearance- Right, and poor foot clearance- Left, Distance walked: ~10', and Comments: RW  FUNCTIONAL TESTS:  5 times sit to stand: 25.84 sec -- uses UEs to push off mat table, legs braced against table to stand, mild tremors/unsteadiness, requires a few attempts to obtain momentum to stand Timed up and go (TUG): 23.06 sec -- shuffling with turns, diminished step length, required 2 attempts to use momentum to stand  PATIENT SURVEYS:  PSFS: THE PATIENT SPECIFIC FUNCTIONAL SCALE  Place score of 0-10 (0 = unable to perform activity and 10 = able to perform activity at the same level as before injury or problem)  Activity Date: 11/05/23    Holding head up 5    2.  Standing balance  5    3.       4.      Total Score 5      Total Score = Sum of activity scores/number of activities  Minimally Detectable  Change: 3 points (for single activity); 2 points (for average score)  [facility] (nd). The Patient Specific Functional Scale . Retrieved from [email or web address]  GOALS: Goals reviewed with patient? Yes  SHORT TERM GOALS: Target date: 02/02/2024     Pt will be able to perform HEP with caregiver min A Baseline: Goal status: MET 12/01/23  2.  Pt will be able to maintain neck extension to at least within 10 deg of neutral Baseline: pt is 40 to 20 deg away from neutral; 17 degrees flexion. Able to get to actively get to neutral neck posture 9.30.25 Goal status: NOT MET 12/01/23  3.  Demo modified independent stand-pivot transfers from w/c to various surfaces to improve safety with maneuvering in small spaces  Baseline: CGA-SBA  Goal status: INITIAL   LONG TERM GOALS: Target date: 02/23/2024     Pt will be able to perform head/neck and postural strengthening with SBA Baseline: pt and caregiver have reported understanding Goal status: IN PROGRESS 12/22/23  2.  Pt will be able to maintain neutral neck for at least 10 min for ADLs Baseline: Unable; unchanged  Goal status: IN PROGRESS 01/12/24  3.  Pt will report improved PSFS by >/=7 to demo MCID Baseline: 5; 7 12/22/23 Goal status: MET 12/22/23  4.  Pt will have improved 5x STS to </=20 sec to demo increased functional LE strength Baseline: 25.84 sec; 19.17 sec with UE; 16 sec w/ BUE Goal status: IN PROGRESS  01/12/24  5.  Pt will have improved TUG to </=15 sec to decrease fall risk Baseline: 23.06 sec; 23.93; 17 sec Goal status: IN PROGRESS 01/12/24    6.  Improve Berg Balance Test score to 45/56 to reduce risk for falls and improve safety    Baseline: 38/56    Goal status: INITIAL   7.  Caregiver to teachback freezing of gait  strategies to maintain household ambulation    Baseline: not est   Goal status: INITIAL   ASSESSMENT:  CLINICAL IMPRESSION: Pt presents today and wants to work on balance. Skilled PT session focused on repeated practice of sit to stand using visual cues for increased forward lean, then standing balance exercises including weightshifting and stepping strategies. Pt needs cues throughout, but does a nice job of slowing pace of exercises to allow for increased step length.  Caregiver education provided at end of session for transfers and strategy to lessen freezing episodes.  Pt will continue to benefit from skilled PT towards goals for improved functional mobility and decreased fall risk.  OBJECTIVE IMPAIRMENTS: Abnormal gait, decreased activity tolerance, decreased balance, decreased endurance, decreased mobility, difficulty walking, decreased ROM, decreased strength, decreased safety awareness, hypomobility, increased fascial restrictions, increased muscle spasms, impaired flexibility, impaired UE functional use, improper body mechanics, postural dysfunction, and pain.   ACTIVITY LIMITATIONS: carrying, lifting, bending, sitting, standing, squatting, stairs, transfers, bed mobility, bathing, toileting, dressing, reach over head, hygiene/grooming, and locomotion level  PARTICIPATION LIMITATIONS: meal prep, cleaning, laundry, medication management, driving, shopping, community activity, and yard work  PERSONAL FACTORS: Age, Fitness, Past/current experiences, and Time since onset of injury/illness/exacerbation are also affecting patient's functional outcome.   REHAB POTENTIAL: Good  CLINICAL DECISION MAKING: Evolving/moderate complexity  EVALUATION COMPLEXITY: Moderate  PLAN:  PT FREQUENCY: 2x/week  PT DURATION: 6 weeks  PLANNED INTERVENTIONS: 97164- PT Re-evaluation, 97110-Therapeutic exercises, 97530- Therapeutic activity, 97112- Neuromuscular re-education, 97535- Self Care, [id number]- Manual  therapy, [id number]- Gait training, [phone]- Aquatic Therapy, [phone]- Electrical stimulation (unattended), [phone] (1-2 muscles), [id number] (3+ muscles)- Dry Needling, Patient/Family education, Balance training, Stair training, Taping, Joint mobilization, Spinal mobilization, Cryotherapy, and Moist heat  PLAN FOR NEXT SESSION:   Step strategy  work at ryder system and consider adding to [employer].  Balance and turning work with RW.  Continue STS with correct technique. Work on deep neck muscle strength/endurance.  Posterior chain strengthening and gross balance.    Greig Anon, PT 01/14/24 10:24 AM Phone: [phone] Fax: [phone]  [facility] at [facility] [address] [place], [address] [place], [place] [address] Phone # [phone] Fax # [phone]

## 2024-01-21 ENCOUNTER — Other Ambulatory Visit: Payer: Self-pay | Admitting: Neurology

## 2024-01-21 DIAGNOSIS — G20A1 Parkinson's disease without dyskinesia, without mention of fluctuations: Secondary | ICD-10-CM

## 2024-01-25 ENCOUNTER — Encounter: Payer: Self-pay | Admitting: Physical Therapy

## 2024-01-25 ENCOUNTER — Ambulatory Visit: Admitting: Physical Therapy

## 2024-01-25 DIAGNOSIS — M6281 Muscle weakness (generalized): Secondary | ICD-10-CM | POA: Diagnosis not present

## 2024-01-25 DIAGNOSIS — R2681 Unsteadiness on feet: Secondary | ICD-10-CM

## 2024-01-25 DIAGNOSIS — R2689 Other abnormalities of gait and mobility: Secondary | ICD-10-CM | POA: Diagnosis not present

## 2024-01-25 DIAGNOSIS — R269 Unspecified abnormalities of gait and mobility: Secondary | ICD-10-CM | POA: Diagnosis not present

## 2024-01-25 DIAGNOSIS — R293 Abnormal posture: Secondary | ICD-10-CM | POA: Diagnosis not present

## 2024-01-25 NOTE — Therapy (Signed)
 OUTPATIENT PHYSICAL THERAPY TREATMENT NOTE   Patient Name: Cody Dickerson MRN: [id number] DOB:Jul 03, 1950, 73 y.o., male Today's Date: 01/25/2024   PCP: Okey Carlin Redbird, MD REFERRING PROVIDER: Evonnie Asberry RAMAN, DO      END OF SESSION:  PT End of Session - 01/25/24 0936     Visit Number 17    Number of Visits 27    Date for Recertification  02/23/24    Authorization Type Medicare/[employer]!!!!!!!    Progress Note Due on Visit 25    PT Start Time 0936    PT Stop Time 1014    PT Time Calculation (min) 38 min    Equipment Utilized During Treatment Gait belt    Activity Tolerance Patient tolerated treatment well    Behavior During Therapy [facility] for tasks assessed/performed;Impulsive                    Past Medical History:  Diagnosis Date   Balance problem    Gout    Hypercholesterolemia    Hypertension    Parkinson's disease (HCC)    Small bowel obstruction (HCC)    Past Surgical History:  Procedure Laterality Date   BOWEL BLOCKAGE  05/2019   IR ANGIO INTRA EXTRACRAN SEL COM CAROTID INNOMINATE BILAT MOD SED  10/03/2021   IR ANGIO VERTEBRAL SEL VERTEBRAL UNI R MOD SED  10/03/2021   IR RADIOLOGIST EVAL & MGMT  08/19/2021   IR US  GUIDE VASC ACCESS RIGHT  10/04/2021   Patient Active Problem List   Diagnosis Date Noted   Stenosis of right carotid artery 08/14/2023   Orthostatic hypotension 08/14/2023   Supine hypertension 08/14/2023   Hypercholesteremia 08/14/2023   Pain due to onychomycosis of toenails of both feet 09/03/2022   Porokeratosis 06/04/2022   Parkinson's disease (HCC) 02/06/2021   SBO (small bowel obstruction) (HCC) 07/06/2019    ONSET DATE: 3-4 years  REFERRING DIAG: R53.1 (ICD-10-CM) - Strength loss of R26.9 (ICD-10-CM) - Gait difficulty R26.89 (ICD-10-CM) - Balance problem Z74.09 (ICD-10-CM) - Impaired functional mobility, balance, and endurance  THERAPY DIAG:  Unsteadiness on feet  Other abnormalities of gait and mobility  Muscle weakness  (generalized)  Rationale for Evaluation and Treatment: Rehabilitation  SUBJECTIVE:                                                                                                                                                                                             SUBJECTIVE STATEMENT: Glad that you have heard from [employer].  Really just want regular old wheelchair with non-flip on the back. *I am going to get a lightweight transfer wheelchair.  Had a fall on Friday-fell out of chair trying to get into kitchen threshold.    Pt accompanied by: Caregiver- Mary   PERTINENT HISTORY: Parkinson's, Left distal supraclinoid ICA aneurysm, mild cognitive impairment Has been doing HHPT 1x/wk  PAIN:  Are you having pain? Yes: NPRS scale: 0/10 Pain location: Base of neck Pain description: like something's in there, pinching Aggravating factors: holding head down Relieving factors: Advil   PRECAUTIONS: None  RED FLAGS: None   WEIGHT BEARING RESTRICTIONS: No  FALLS: Has patient fallen in last 6 months? Yes. Number of falls last fell out of chair ~1.5 weeks ago; 2-3 falls   LIVING ENVIRONMENT: Lives with: lives with their family and Has a caregiver present 8a-12p daily; Wife is otherwise present Lives in: House/apartment Stairs: 3 steps to enter/exit Has following equipment at home: Vannie - 2 wheeled and Wheelchair (manual)  PLOF: Needs assistance with ADLs and Needs assistance with gait  PATIENT GOALS: Improve neck posture and balance  OBJECTIVE:   *Caregiver present for session today  TODAY'S TREATMENT: 01/25/2024 Activity Comments  Sit to stand repeated practice 2 x 5 reps with min assist/min guard and cues, additional times throughout session, with instruction to caregiver To pick up yoga block from floor, then removed block and continued to use as cue  Lateral step and reach x 10 Min guard and cues to slow pace  Dynamic balance: Step strategy work-  posterior Forward/back step strategy with added head motions up/down, min guard Stagger stance forward/back rocking At parallel bars, cues for slowed pace, exaggerated motion  Stagger stance foot position with upper body activities with pool noodle-lift overhead, 2 x 5 reps Standing row 10 reps    Short distance gait 15-25 ft With RW, min guard, cues for increased step length                                                                                                                                          PATIENT EDUCATION: Education details: Reviewed with  patient and caregiver in sit to stand technique (using yoga block as visual cue for forward lean); stagger stance position for standing.  Discussed wheelchair assessment/eval with [employer] and PT to contact them to confirm appt time Person educated: Patient, caregiver Education method: Explanation, Demonstration, and Verbal cues Education comprehension: verbalized understanding, returned demonstration, verbal cues required, and needs further education    HOME EXERCISE PROGRAM: Access Code: [email or web address] URL: [email or web address] Date: 11/26/2023 Prepared by: Gellen April Earnie Starring  Exercises - Seated Thoracic Lumbar Extension with Pectoralis Stretch  - 3 x daily - 7 x weekly - 2-3 sets - 10 reps - Seated Cervical Retraction Extension Passive Loaded  - 3 x daily - 7 x weekly - 2-3 sets - 10 reps - Seated Cervical Retraction  - 1 x daily - 7 x weekly - 2 sets - 5 reps - 5 sec hold -  Seated Diagonal Chops with Medicine Ball  - 1 x daily - 7 x weekly - 2 sets - 10 reps - Supine Isometric Neck Extension  - 1 x daily - 7 x weekly - 3 sets - 10 reps - 2 sec hold      Note: Objective measures were completed at Evaluation unless otherwise noted.  DIAGNOSTIC FINDINGS: n/a  COGNITION: Overall cognitive status: History of cognitive impairments - at baseline  COORDINATION: Tremors present  throughout  POSTURE: rounded shoulders, forward head, and increased thoracic kyphosis  CERVICAL ROM: At rest flexed ~40 deg  Active ROM A/PROM (deg) eval  Flexion Can get chin to chest  Extension -40 to -20 deg (unable to go into full extension)  Right lateral flexion 40  Left lateral flexion 20  Right rotation 35  Left rotation 40   (Blank rows = not tested)   LOWER EXTREMITY ROM:   WNL  LOWER EXTREMITY MMT:    MMT Right Eval Left Eval  Hip flexion 5 5  Hip extension 4 4  Hip abduction 3+ 3+  Hip adduction    Hip internal rotation    Hip external rotation    Knee flexion 4- 3+  Knee extension 5 5  Ankle dorsiflexion    Ankle plantarflexion    Ankle inversion    Ankle eversion    (Blank rows = not tested)  BED MOBILITY:  Findings: Sit to supine SBA Supine to sit SBA Multiple small scooting motions  TRANSFERS: Sit to stand: Min A  Assistive device utilized: [employer] - 2 wheeled     Stand to sit: CGA and Min A  Assistive device utilized: [employer] - 2 wheeled     Min A for stability -- will at times require multiple attempts to come to standing  GAIT: Findings: Gait Characteristics: Festination at times, step through pattern, decreased step length- Right, decreased step length- Left, decreased stance time- Right, decreased stance time- Left, shuffling, trunk flexed, poor foot clearance- Right, and poor foot clearance- Left, Distance walked: ~10', and Comments: RW  FUNCTIONAL TESTS:  5 times sit to stand: 25.84 sec -- uses UEs to push off mat table, legs braced against table to stand, mild tremors/unsteadiness, requires a few attempts to obtain momentum to stand Timed up and go (TUG): 23.06 sec -- shuffling with turns, diminished step length, required 2 attempts to use momentum to stand  PATIENT SURVEYS:  PSFS: THE PATIENT SPECIFIC FUNCTIONAL SCALE  Place score of 0-10 (0 = unable to perform activity and 10 = able to perform activity at the same level as before injury  or problem)  Activity Date: 11/05/23    Holding head up 5    2.  Standing balance  5    3.       4.      Total Score 5      Total Score = Sum of activity scores/number of activities  Minimally Detectable Change: 3 points (for single activity); 2 points (for average score)  [facility] (nd). The Patient Specific Functional Scale . Retrieved from [email or web address]  GOALS: Goals reviewed with patient? Yes  SHORT TERM GOALS: Target date: 02/02/2024     Pt will be able to perform HEP with caregiver min A Baseline: Goal status: MET 12/01/23  2.  Pt will be able to maintain neck extension to at least within 10 deg of neutral Baseline: pt is 40 to 20 deg away from neutral; 17 degrees flexion. Able to get to actively get to neutral neck posture 9.30.25 Goal status: NOT MET 12/01/23  3.  Demo modified independent stand-pivot transfers from w/c to various surfaces to improve safety with maneuvering in small spaces  Baseline: CGA-SBA  Goal status: INITIAL   LONG TERM GOALS: Target date: 02/23/2024     Pt will be able to perform head/neck and postural strengthening with SBA Baseline: pt and caregiver have reported understanding Goal status: IN PROGRESS 12/22/23  2.  Pt will be able to maintain neutral neck for at least 10 min for ADLs Baseline: Unable; unchanged  Goal status: IN PROGRESS 01/12/24  3.  Pt will report improved PSFS by >/=7 to demo MCID Baseline: 5; 7 12/22/23 Goal status: MET 12/22/23  4.  Pt will have improved 5x STS to </=20 sec to demo increased functional LE strength Baseline: 25.84 sec; 19.17 sec with UE; 16 sec w/ BUE Goal status: IN PROGRESS  01/12/24  5.  Pt will have improved TUG to </=15 sec to decrease fall risk Baseline: 23.06 sec; 23.93; 17 sec Goal  status: IN PROGRESS 01/12/24    6.  Improve Berg Balance Test score to 45/56 to reduce risk for falls and improve safety    Baseline: 38/56    Goal status: INITIAL   7.  Caregiver to teachback freezing of gait strategies to maintain household ambulation    Baseline: not est   Goal status: INITIAL   ASSESSMENT:  CLINICAL IMPRESSION: Pt presents today with reports of one fall last week, from his current w/c at home trying to negotiate threshold into kitchen. Skilled PT session focused on transfers and standing balance, with education provided to caregiver on transfer technique and foot position in standing for more optimal balance. Able to work in stagger stance position with upper body activities without support, for short bouts, to challenge his balance today.  Pt will continue to benefit from skilled PT towards goals for improved functional mobility and decreased fall risk.  OBJECTIVE IMPAIRMENTS: Abnormal gait, decreased activity tolerance, decreased balance, decreased endurance, decreased mobility, difficulty walking, decreased ROM, decreased strength, decreased safety awareness, hypomobility, increased fascial restrictions, increased muscle spasms, impaired flexibility, impaired UE functional use, improper body mechanics, postural dysfunction, and pain.   ACTIVITY LIMITATIONS: carrying, lifting, bending, sitting, standing, squatting, stairs, transfers, bed mobility, bathing, toileting, dressing, reach over head, hygiene/grooming, and locomotion level  PARTICIPATION LIMITATIONS: meal prep, cleaning, laundry, medication management, driving, shopping, community activity, and yard work  PERSONAL FACTORS: Age, Fitness, Past/current experiences, and Time since onset of injury/illness/exacerbation are also affecting patient's functional outcome.   REHAB POTENTIAL: Good  CLINICAL DECISION MAKING: Evolving/moderate complexity  EVALUATION COMPLEXITY: Moderate  PLAN:  PT FREQUENCY:  2x/week  PT DURATION: 6 weeks  PLANNED INTERVENTIONS: 97164- PT Re-evaluation, 97110-Therapeutic exercises, 97530- Therapeutic activity, 97112- Neuromuscular re-education, 97535- Self Care, [id number]- Manual therapy, [id number]- Gait training, [id number]- Aquatic Therapy, [phone]- Electrical stimulation (unattended), [phone] (1-2 muscles), [id number] (3+ muscles)- Dry Needling, Patient/Family education, Balance training, Stair training, Taping, Joint mobilization, Spinal mobilization, Cryotherapy, and Moist heat  PLAN FOR NEXT SESSION:   Follow up with pt/[employer] about  wheelchair assessment and scheduling.  Step strategy work at [employer]der system and consider adding to [employer].  Balance and turning work with RW.  Continue STS with correct technique. Work on deep neck muscle strength/endurance.  Posterior chain strengthening and gross balance.    Greig Anon, PT 01/25/24 11:45 AM Phone: [phone] Fax: [phone]  [facility] at [facility] [address] [place], [address] [place], [place] [address] Phone # [phone] Fax # [phone]

## 2024-01-26 NOTE — Therapy (Signed)
 OUTPATIENT PHYSICAL THERAPY TREATMENT NOTE   Patient Name: Cody Dickerson MRN: [id number] DOB:1950-03-25, 73 y.o., male Today's Date: 01/27/2024   PCP: Okey Carlin Redbird, MD REFERRING PROVIDER: Tat, Asberry RAMAN, DO      END OF SESSION:  PT End of Session - 01/27/24 0929     Visit Number 18    Number of Visits 27    Date for Recertification  02/23/24    Authorization Type Medicare/[employer]ARP-[place]!!!!!!!    Progress Note Due on Visit 25    PT Start Time 0846    PT Stop Time 0929    PT Time Calculation (min) 43 min    Equipment Utilized During Treatment Gait belt    Activity Tolerance Patient tolerated treatment well    Behavior During Therapy [facility] for tasks assessed/performed;Impulsive                     Past Medical History:  Diagnosis Date   Balance problem    Gout    Hypercholesterolemia    Hypertension    Parkinson's disease (HCC)    Small bowel obstruction (HCC)    Past Surgical History:  Procedure Laterality Date   BOWEL BLOCKAGE  05/2019   IR ANGIO INTRA EXTRACRAN SEL COM CAROTID INNOMINATE BILAT MOD SED  10/03/2021   IR ANGIO VERTEBRAL SEL VERTEBRAL UNI R MOD SED  10/03/2021   IR RADIOLOGIST EVAL & MGMT  08/19/2021   IR US  GUIDE VASC ACCESS RIGHT  10/04/2021   Patient Active Problem List   Diagnosis Date Noted   Stenosis of right carotid artery 08/14/2023   Orthostatic hypotension 08/14/2023   Supine hypertension 08/14/2023   Hypercholesteremia 08/14/2023   Pain due to onychomycosis of toenails of both feet 09/03/2022   Porokeratosis 06/04/2022   Parkinson's disease (HCC) 02/06/2021   SBO (small bowel obstruction) (HCC) 07/06/2019    ONSET DATE: 3-4 years  REFERRING DIAG: R53.1 (ICD-10-CM) - Strength loss of R26.9 (ICD-10-CM) - Gait difficulty R26.89 (ICD-10-CM) - Balance problem Z74.09 (ICD-10-CM) - Impaired functional mobility, balance, and endurance  THERAPY DIAG:  Unsteadiness on feet  Other abnormalities of gait and mobility  Muscle  weakness (generalized)  Abnormal posture  Gait difficulty  Rationale for Evaluation and Treatment: Rehabilitation  SUBJECTIVE:                                                                                                                                                                                             SUBJECTIVE STATEMENT: No falls since last Friday.     Pt accompanied by: Caregiver- in waiting room   PERTINENT HISTORY: Parkinson's, Left  distal supraclinoid ICA aneurysm, mild cognitive impairment Has been doing HHPT 1x/wk  PAIN:  Are you having pain? Yes: NPRS scale: 4/10 Pain location: Base of neck Pain description: sore Aggravating factors: holding head down Relieving factors: Advil   PRECAUTIONS: None  RED FLAGS: None   WEIGHT BEARING RESTRICTIONS: No  FALLS: Has patient fallen in last 6 months? Yes. Number of falls last fell out of chair ~1.5 weeks ago; 2-3 falls   LIVING ENVIRONMENT: Lives with: lives with their family and Has a caregiver present 8a-12p daily; Wife is otherwise present Lives in: House/apartment Stairs: 3 steps to enter/exit Has following equipment at home: Vannie - 2 wheeled and Wheelchair (manual)  PLOF: Needs assistance with ADLs and Needs assistance with gait  PATIENT GOALS: Improve neck posture and balance  OBJECTIVE:      TODAY'S TREATMENT: 01/27/24 Activity Comments  review of last session's focus: -STS picking up yoga block from floor -staggered stance for standing balance at counter (cues for wider BOS, pushing chest forward as manual cue into PT's hand)  Verbal cueing for set up: wide BOS, scooting forward. Pt drives hips posteriorly upon pushing to stand, requiring min-mod A to prevent posterior LOB. Advised to perform STS with supervision   standing at counter: alt fwd step alt backward step standing head nods/turns for neck stretch and balance  standing reaching into cabinet (CGA and with/without UE support) PT  maintains hand on pt's chest for manual feedback to increase anterior trunk lean. Pt's neck appears to fatigue with upright tasks and lifting head becomes more challenging. Pt did well stabilizing while performing reaching task as attention was directed forward rather than back        PATIENT EDUCATION: Education details: overview of cueing and focus from today's sesssion  Person educated: Patient Education method: Explanation, Demonstration, Tactile cues, and Verbal cues Education comprehension: verbalized understanding and returned demonstration   HOME EXERCISE PROGRAM: Access Code: [email or web address] URL: [email or web address] Date: 11/26/2023 Prepared by: Gellen April Earnie Starring  Exercises - Seated Thoracic Lumbar Extension with Pectoralis Stretch  - 3 x daily - 7 x weekly - 2-3 sets - 10 reps - Seated Cervical Retraction Extension Passive Loaded  - 3 x daily - 7 x weekly - 2-3 sets - 10 reps - Seated Cervical Retraction  - 1 x daily - 7 x weekly - 2 sets - 5 reps - 5 sec hold - Seated Diagonal Chops with Medicine Ball  - 1 x daily - 7 x weekly - 2 sets - 10 reps - Supine Isometric Neck Extension  - 1 x daily - 7 x weekly - 3 sets - 10 reps - 2 sec hold      Note: Objective measures were completed at Evaluation unless otherwise noted.  DIAGNOSTIC FINDINGS: n/a  COGNITION: Overall cognitive status: History of cognitive impairments - at baseline  COORDINATION: Tremors present throughout  POSTURE: rounded shoulders, forward head, and increased thoracic kyphosis  CERVICAL ROM: At rest flexed ~40 deg  Active ROM A/PROM (deg) eval  Flexion Can get chin to chest  Extension -40 to -20 deg (unable to go into full extension)  Right lateral flexion 40  Left lateral flexion 20  Right rotation 35  Left rotation 40   (Blank rows = not tested)   LOWER EXTREMITY ROM:   WNL  LOWER EXTREMITY MMT:    MMT Right Eval Left Eval  Hip flexion 5 5  Hip extension 4 4  Hip  abduction 3+ 3+  Hip  adduction    Hip internal rotation    Hip external rotation    Knee flexion 4- 3+  Knee extension 5 5  Ankle dorsiflexion    Ankle plantarflexion    Ankle inversion    Ankle eversion    (Blank rows = not tested)  BED MOBILITY:  Findings: Sit to supine SBA Supine to sit SBA Multiple small scooting motions  TRANSFERS: Sit to stand: Min A  Assistive device utilized: [employer] - 2 wheeled     Stand to sit: CGA and Min A  Assistive device utilized: [employer]ant - 2 wheeled     Min A for stability -- will at times require multiple attempts to come to standing  GAIT: Findings: Gait Characteristics: Festination at times, step through pattern, decreased step length- Right, decreased step length- Left, decreased stance time- Right, decreased stance time- Left, shuffling, trunk flexed, poor foot clearance- Right, and poor foot clearance- Left, Distance walked: ~10', and Comments: RW  FUNCTIONAL TESTS:  5 times sit to stand: 25.84 sec -- uses UEs to push off mat table, legs braced against table to stand, mild tremors/unsteadiness, requires a few attempts to obtain momentum to stand Timed up and go (TUG): 23.06 sec -- shuffling with turns, diminished step length, required 2 attempts to use momentum to stand  PATIENT SURVEYS:  PSFS: THE PATIENT SPECIFIC FUNCTIONAL SCALE  Place score of 0-10 (0 = unable to perform activity and 10 = able to perform activity at the same level as before injury or problem)  Activity Date: 11/05/23    Holding head up 5    2.  Standing balance  5    3.       4.      Total Score 5      Total Score = Sum of activity scores/number of activities  Minimally Detectable Change: 3 points (for single activity); 2 points (for average score)  [facility] (nd). The Patient Specific Functional Scale . Retrieved from [email or web address]                                                                                                                                   GOALS: Goals reviewed with patient? Yes  SHORT TERM GOALS: Target date: 02/02/2024     Pt will be able to perform HEP with caregiver min A Baseline: Goal status: MET 12/01/23  2.  Pt will be able to maintain neck extension to at least within 10 deg of neutral Baseline: pt is 40 to 20 deg away from neutral; 17 degrees flexion. Able to get to actively get to neutral neck posture 9.30.25 Goal status: NOT MET 12/01/23  3.  Demo modified independent stand-pivot transfers from w/c to various surfaces to improve safety with maneuvering in small spaces  Baseline: CGA-SBA  Goal status: INITIAL   LONG TERM GOALS: Target date: 02/23/2024     Pt will be able to perform  head/neck and postural strengthening with SBA Baseline: pt and caregiver have reported understanding Goal status: IN PROGRESS 12/22/23  2.  Pt will be able to maintain neutral neck for at least 10 min for ADLs Baseline: Unable; unchanged  Goal status: IN PROGRESS 01/12/24  3.  Pt will report improved PSFS by >/=7 to demo MCID Baseline: 5; 7 12/22/23 Goal status: MET 12/22/23  4.  Pt will have improved 5x STS to </=20 sec to demo increased functional LE strength Baseline: 25.84 sec; 19.17 sec with UE; 16 sec w/ BUE Goal status: IN PROGRESS  01/12/24  5.  Pt will have improved TUG to </=15 sec to decrease fall risk Baseline: 23.06 sec; 23.93; 17 sec Goal status: IN PROGRESS 01/12/24    6.  Improve Berg Balance Test score to 45/56 to reduce risk for falls and improve safety    Baseline: 38/56    Goal status: INITIAL   7.  Caregiver to teachback freezing of gait strategies to maintain household ambulation    Baseline: not est   Goal status: INITIAL   ASSESSMENT:  CLINICAL IMPRESSION: Patient arrived to session with some neck pain but no recent falls. Session focused on review of techniques from last session as well as dynamic standing balance.  Patient does well maintaining supported/unsupported standing when attention is brought forward, but continues with strong retropulsion with STS and will need caregiver support when performing this at home- patient reported understanding of this. Patient tolerated session well and without complaints at end of appointment. OBJECTIVE IMPAIRMENTS: Abnormal gait, decreased activity tolerance, decreased balance, decreased endurance, decreased mobility, difficulty walking, decreased ROM, decreased strength, decreased safety awareness, hypomobility, increased fascial restrictions, increased muscle spasms, impaired flexibility, impaired UE functional use, improper body mechanics, postural dysfunction, and pain.   ACTIVITY LIMITATIONS: carrying, lifting, bending, sitting, standing, squatting, stairs, transfers, bed mobility, bathing, toileting, dressing, reach over head, hygiene/grooming, and locomotion level  PARTICIPATION LIMITATIONS: meal prep, cleaning, laundry, medication management, driving, shopping, community activity, and yard work  PERSONAL FACTORS: Age, Fitness, Past/current experiences, and Time since onset of injury/illness/exacerbation are also affecting patient's functional outcome.   REHAB POTENTIAL: Good  CLINICAL DECISION MAKING: Evolving/moderate complexity  EVALUATION COMPLEXITY: Moderate  PLAN:  PT FREQUENCY: 2x/week  PT DURATION: 6 weeks  PLANNED INTERVENTIONS: 97164- PT Re-evaluation, 97110-Therapeutic exercises, 97530- Therapeutic activity, 97112- Neuromuscular re-education, 97535- Self Care, 02859- Manual therapy, [id number]- Gait training, [phone]- Aquatic Therapy, [phone]- Electrical stimulation (unattended), [phone] (1-2 muscles), 20561 (3+ muscles)- Dry Needling, Patient/Family education, Balance training, Stair training, Taping, Joint mobilization, Spinal mobilization, Cryotherapy, and Moist heat  PLAN FOR NEXT SESSION:   Follow up with pt/[employer] about wheelchair assessment and  scheduling.  Step strategy work at [employer] system and consider adding to [employer].  Balance and turning work with RW.  Continue STS with correct technique. Work on deep neck muscle strength/endurance.  Posterior chain strengthening and gross balance.   Louana Terrilyn Christians, PT, DPT 01/27/24 10:31 AM  [facility] at [facility] [address] [place], [address] [place], [place] [address] Phone # [phone] Fax # [phone]

## 2024-01-27 ENCOUNTER — Ambulatory Visit: Admitting: Physical Therapy

## 2024-01-27 ENCOUNTER — Encounter: Payer: Self-pay | Admitting: Physical Therapy

## 2024-01-27 DIAGNOSIS — R2681 Unsteadiness on feet: Secondary | ICD-10-CM

## 2024-01-27 DIAGNOSIS — R269 Unspecified abnormalities of gait and mobility: Secondary | ICD-10-CM

## 2024-01-27 DIAGNOSIS — R2689 Other abnormalities of gait and mobility: Secondary | ICD-10-CM

## 2024-01-27 DIAGNOSIS — M6281 Muscle weakness (generalized): Secondary | ICD-10-CM | POA: Diagnosis not present

## 2024-01-27 DIAGNOSIS — R293 Abnormal posture: Secondary | ICD-10-CM | POA: Diagnosis not present

## 2024-01-31 DIAGNOSIS — I10 Essential (primary) hypertension: Secondary | ICD-10-CM | POA: Diagnosis not present

## 2024-01-31 DIAGNOSIS — E7800 Pure hypercholesterolemia, unspecified: Secondary | ICD-10-CM | POA: Diagnosis not present

## 2024-02-02 ENCOUNTER — Ambulatory Visit: Admitting: Physical Therapy

## 2024-02-02 ENCOUNTER — Encounter: Payer: Self-pay | Admitting: Physical Therapy

## 2024-02-02 DIAGNOSIS — R2689 Other abnormalities of gait and mobility: Secondary | ICD-10-CM | POA: Insufficient documentation

## 2024-02-02 DIAGNOSIS — R293 Abnormal posture: Secondary | ICD-10-CM | POA: Insufficient documentation

## 2024-02-02 DIAGNOSIS — R269 Unspecified abnormalities of gait and mobility: Secondary | ICD-10-CM | POA: Insufficient documentation

## 2024-02-02 DIAGNOSIS — M6281 Muscle weakness (generalized): Secondary | ICD-10-CM | POA: Insufficient documentation

## 2024-02-02 DIAGNOSIS — R2681 Unsteadiness on feet: Secondary | ICD-10-CM | POA: Insufficient documentation

## 2024-02-02 NOTE — Therapy (Signed)
 OUTPATIENT PHYSICAL THERAPY TREATMENT NOTE   Patient Name: Cody Dickerson MRN: [id number] DOB:07-13-1950, 73 y.o., male Today's Date: 02/02/2024   PCP: Okey Carlin Redbird, MD REFERRING PROVIDER: Tat, Asberry RAMAN, DO      END OF SESSION:  PT End of Session - 02/02/24 0932     Visit Number 19    Number of Visits 27    Date for Recertification  02/23/24    Authorization Type Medicare/AARP-[place]!!!!!!!    Progress Note Due on Visit 25    PT Start Time 0933    PT Stop Time 1011    PT Time Calculation (min) 38 min    Equipment Utilized During Treatment Gait belt    Activity Tolerance Patient tolerated treatment well    Behavior During Therapy WFL for tasks assessed/performed;Impulsive                      Past Medical History:  Diagnosis Date   Balance problem    Gout    Hypercholesterolemia    Hypertension    Parkinson's disease (HCC)    Small bowel obstruction (HCC)    Past Surgical History:  Procedure Laterality Date   BOWEL BLOCKAGE  05/2019   IR ANGIO INTRA EXTRACRAN SEL COM CAROTID INNOMINATE BILAT MOD SED  10/03/2021   IR ANGIO VERTEBRAL SEL VERTEBRAL UNI R MOD SED  10/03/2021   IR RADIOLOGIST EVAL & MGMT  08/19/2021   IR US  GUIDE VASC ACCESS RIGHT  10/04/2021   Patient Active Problem List   Diagnosis Date Noted   Stenosis of right carotid artery 08/14/2023   Orthostatic hypotension 08/14/2023   Supine hypertension 08/14/2023   Hypercholesteremia 08/14/2023   Pain due to onychomycosis of toenails of both feet 09/03/2022   Porokeratosis 06/04/2022   Parkinson's disease (HCC) 02/06/2021   SBO (small bowel obstruction) (HCC) 07/06/2019    ONSET DATE: 3-4 years  REFERRING DIAG: R53.1 (ICD-10-CM) - Strength loss of R26.9 (ICD-10-CM) - Gait difficulty R26.89 (ICD-10-CM) - Balance problem Z74.09 (ICD-10-CM) - Impaired functional mobility, balance, and endurance  THERAPY DIAG:  Unsteadiness on feet  Other abnormalities of gait and mobility  Muscle  weakness (generalized)  Abnormal posture  Rationale for Evaluation and Treatment: Rehabilitation  SUBJECTIVE:                                                                                                                                                                                             SUBJECTIVE STATEMENT: No changes, no falls since last visit.  Pt accompanied by: Caregiver- in waiting room   PERTINENT HISTORY: Parkinson's, Left distal supraclinoid ICA  aneurysm, mild cognitive impairment Has been doing HHPT 1x/wk  PAIN:  Are you having pain? Yes: NPRS scale: 4/10 Pain location: Base of neck Pain description: sore Aggravating factors: holding head down Relieving factors: Advil   PRECAUTIONS: None  RED FLAGS: None   WEIGHT BEARING RESTRICTIONS: No  FALLS: Has patient fallen in last 6 months? Yes. Number of falls last fell out of chair ~1.5 weeks ago; 2-3 falls   LIVING ENVIRONMENT: Lives with: lives with their family and Has a caregiver present 8a-12p daily; Wife is otherwise present Lives in: House/apartment Stairs: 3 steps to enter/exit Has following equipment at home: Vannie - 2 wheeled and Wheelchair (manual)  PLOF: Needs assistance with ADLs and Needs assistance with gait  PATIENT GOALS: Improve neck posture and balance  OBJECTIVE:     TODAY'S TREATMENT: 02/02/2024 Activity Comments  In reclined position on mat: neck stretching and neck extensor activation: Neck retraction 2 x 5 reps Hands behind head x 5 reps Horizontal shoulder adduction 2 x 5 reps Scapular retraction, elbows bent 2 x 5 reps Multi modal cues, extra time  Seated postural strengthening with ball behind head: -neck retraction 2 x 5 -Horizontal shoulder adduction 2 x 5 reps Scapular retraction, elbows bent 2 x 5 reps    LAQ 10 reps Posterior lean in chair with attempts at holding neck with better posture  At counter: Alternating forward step x 10 Alternating back step x  10 Alternating forward step and reach to cabinet x 10 PT provides manual cues at chest and Vcs to look ahead         Pt reports neck feels better at end of session     PATIENT EDUCATION: Education details: overview of cueing and focus from today's sesssion; positioning in semi-reclined position similar to recliner at home  (pt to get a recliner soon) Person educated: Patient Education method: Explanation, Demonstration, Tactile cues, and Verbal cues Education comprehension: verbalized understanding and returned demonstration   HOME EXERCISE PROGRAM: Access Code: [email or web address] URL: [email or web address] Date: 11/26/2023 Prepared by: Gellen April Earnie Starring  Exercises - Seated Thoracic Lumbar Extension with Pectoralis Stretch  - 3 x daily - 7 x weekly - 2-3 sets - 10 reps - Seated Cervical Retraction Extension Passive Loaded  - 3 x daily - 7 x weekly - 2-3 sets - 10 reps - Seated Cervical Retraction  - 1 x daily - 7 x weekly - 2 sets - 5 reps - 5 sec hold - Seated Diagonal Chops with Medicine Ball  - 1 x daily - 7 x weekly - 2 sets - 10 reps - Supine Isometric Neck Extension  - 1 x daily - 7 x weekly - 3 sets - 10 reps - 2 sec hold      Note: Objective measures were completed at Evaluation unless otherwise noted.  DIAGNOSTIC FINDINGS: n/a  COGNITION: Overall cognitive status: History of cognitive impairments - at baseline  COORDINATION: Tremors present throughout  POSTURE: rounded shoulders, forward head, and increased thoracic kyphosis  CERVICAL ROM: At rest flexed ~40 deg  Active ROM A/PROM (deg) eval  Flexion Can get chin to chest  Extension -40 to -20 deg (unable to go into full extension)  Right lateral flexion 40  Left lateral flexion 20  Right rotation 35  Left rotation 40   (Blank rows = not tested)   LOWER EXTREMITY ROM:   WNL  LOWER EXTREMITY MMT:    MMT Right Eval Left Eval  Hip flexion 5 5  Hip  extension 4 4  Hip abduction 3+ 3+   Hip adduction    Hip internal rotation    Hip external rotation    Knee flexion 4- 3+  Knee extension 5 5  Ankle dorsiflexion    Ankle plantarflexion    Ankle inversion    Ankle eversion    (Blank rows = not tested)  BED MOBILITY:  Findings: Sit to supine SBA Supine to sit SBA Multiple small scooting motions  TRANSFERS: Sit to stand: Min A  Assistive device utilized: Environmental Consultant - 2 wheeled     Stand to sit: CGA and Min A  Assistive device utilized: Environmental Consultant - 2 wheeled     Min A for stability -- will at times require multiple attempts to come to standing  GAIT: Findings: Gait Characteristics: Festination at times, step through pattern, decreased step length- Right, decreased step length- Left, decreased stance time- Right, decreased stance time- Left, shuffling, trunk flexed, poor foot clearance- Right, and poor foot clearance- Left, Distance walked: ~10', and Comments: RW  FUNCTIONAL TESTS:  5 times sit to stand: 25.84 sec -- uses UEs to push off mat table, legs braced against table to stand, mild tremors/unsteadiness, requires a few attempts to obtain momentum to stand Timed up and go (TUG): 23.06 sec -- shuffling with turns, diminished step length, required 2 attempts to use momentum to stand  PATIENT SURVEYS:  PSFS: THE PATIENT SPECIFIC FUNCTIONAL SCALE  Place score of 0-10 (0 = unable to perform activity and 10 = able to perform activity at the same level as before injury or problem)  Activity Date: 11/05/23    Holding head up 5    2.  Standing balance  5    3.       4.      Total Score 5      Total Score = Sum of activity scores/number of activities  Minimally Detectable Change: 3 points (for single activity); 2 points (for average score)  [facility] (nd). The Patient Specific Functional Scale . Retrieved from [email or web address]                                                                                                                                   GOALS: Goals reviewed with patient? Yes  SHORT TERM GOALS: Target date: 02/02/2024     Pt will be able to perform HEP with caregiver min A Baseline: Goal status: MET 12/01/23  2.  Pt will be able to maintain neck extension to at least within 10 deg of neutral Baseline: pt is 40 to 20 deg away from neutral; 17 degrees flexion. Able to get to actively get to neutral neck posture 9.30.25 Goal status: NOT MET 12/01/23  3.  Demo modified independent stand-pivot transfers from w/c to various surfaces to improve safety with maneuvering in small spaces  Baseline: CGA-SBA-continues to need CGA for safety 02/02/2024  Goal status: IN PROGRESS  LONG TERM GOALS: Target date: 02/23/2024     Pt will be able to perform head/neck and postural strengthening with SBA Baseline: pt and caregiver have reported understanding Goal status: IN PROGRESS 12/22/23  2.  Pt will be able to maintain neutral neck for at least 10 min for ADLs Baseline: Unable; unchanged  Goal status: IN PROGRESS 01/12/24  3.  Pt will report improved PSFS by >/=7 to demo MCID Baseline: 5; 7 12/22/23 Goal status: MET 12/22/23  4.  Pt will have improved 5x STS to </=20 sec to demo increased functional LE strength Baseline: 25.84 sec; 19.17 sec with UE; 16 sec w/ BUE Goal status: IN PROGRESS  01/12/24  5.  Pt will have improved TUG to </=15 sec to decrease fall risk Baseline: 23.06 sec; 23.93; 17 sec Goal status: IN PROGRESS 01/12/24    6.  Improve Berg Balance Test score to 45/56 to reduce risk for falls and improve safety    Baseline: 38/56    Goal status: INITIAL   7.  Caregiver to teachback freezing of gait strategies to maintain household ambulation    Baseline: not est   Goal status: INITIAL   ASSESSMENT:  CLINICAL IMPRESSION: Pt presents today with no new complaints or falls. Skilled PT session focused on neck position/postural stregnthening.  Worked in  semi reclined and seated positions for neck and posture specific exercises, then tried to coordinate more in standing.  Neck muscles fatigue quickly in all positions and he has strong posterior lean in sitting and standing, but he does report pain is less at end of session.  Pt will continue to benefit from skilled PT towards goals for improved functional mobility and decreased fall risk.  OBJECTIVE IMPAIRMENTS: Abnormal gait, decreased activity tolerance, decreased balance, decreased endurance, decreased mobility, difficulty walking, decreased ROM, decreased strength, decreased safety awareness, hypomobility, increased fascial restrictions, increased muscle spasms, impaired flexibility, impaired UE functional use, improper body mechanics, postural dysfunction, and pain.   ACTIVITY LIMITATIONS: carrying, lifting, bending, sitting, standing, squatting, stairs, transfers, bed mobility, bathing, toileting, dressing, reach over head, hygiene/grooming, and locomotion level  PARTICIPATION LIMITATIONS: meal prep, cleaning, laundry, medication management, driving, shopping, community activity, and yard work  PERSONAL FACTORS: Age, Fitness, Past/current experiences, and Time since onset of injury/illness/exacerbation are also affecting patient's functional outcome.   REHAB POTENTIAL: Good  CLINICAL DECISION MAKING: Evolving/moderate complexity  EVALUATION COMPLEXITY: Moderate  PLAN:  PT FREQUENCY: 2x/week  PT DURATION: 6 weeks  PLANNED INTERVENTIONS: 97164- PT Re-evaluation, 97110-Therapeutic exercises, 97530- Therapeutic activity, 97112- Neuromuscular re-education, 97535- Self Care, 02859- Manual therapy, [phone]- Gait training, [phone]- Aquatic Therapy, [phone]- Electrical stimulation (unattended), [phone] (1-2 muscles), 20561 (3+ muscles)- Dry Needling, Patient/Family education, Balance training, Stair training, Taping, Joint mobilization, Spinal mobilization, Cryotherapy, and Moist heat  PLAN FOR NEXT  SESSION:   Follow up with pt/[employer] about wheelchair assessment and scheduling (no word yet for scheduling).  Step strategy work at [employer] and consider adding to [employer].  Balance and turning work with RW.  Continue STS with correct technique. Work on deep neck muscle strength/endurance.  Posterior chain strengthening and gross balance.   Greig Anon, PT 02/02/24 9:37 AM Phone: [phone] Fax: [phone]  [facility] at [facility] [address] [place], [address] [place], [place] [address] Phone # [phone] Fax # [phone]

## 2024-02-04 ENCOUNTER — Encounter: Payer: Self-pay | Admitting: Physical Therapy

## 2024-02-04 ENCOUNTER — Ambulatory Visit: Admitting: Physical Therapy

## 2024-02-04 DIAGNOSIS — R2681 Unsteadiness on feet: Secondary | ICD-10-CM | POA: Diagnosis not present

## 2024-02-04 DIAGNOSIS — R2689 Other abnormalities of gait and mobility: Secondary | ICD-10-CM

## 2024-02-04 DIAGNOSIS — M6281 Muscle weakness (generalized): Secondary | ICD-10-CM

## 2024-02-04 DIAGNOSIS — R293 Abnormal posture: Secondary | ICD-10-CM

## 2024-02-04 NOTE — Therapy (Signed)
 OUTPATIENT PHYSICAL THERAPY TREATMENT NOTE   Patient Name: Cody Dickerson MRN: [id number] DOB:August 13, 1950, 73 y.o., male Today's Date: 02/04/2024   PCP: Okey Carlin Redbird, MD REFERRING PROVIDER: Tat, Asberry RAMAN, DO      END OF SESSION:  PT End of Session - 02/04/24 0921     Visit Number 20    Number of Visits 27    Date for Recertification  02/23/24    Authorization Type Medicare/[employer]!!!!!!!    Progress Note Due on Visit 25    PT Start Time 0930    PT Stop Time 1008    PT Time Calculation (min) 38 min    Equipment Utilized During Treatment Gait belt    Activity Tolerance Patient tolerated treatment well    Behavior During Therapy WFL for tasks assessed/performed;Impulsive                      Past Medical History:  Diagnosis Date   Balance problem    Gout    Hypercholesterolemia    Hypertension    Parkinson's disease (HCC)    Small bowel obstruction (HCC)    Past Surgical History:  Procedure Laterality Date   BOWEL BLOCKAGE  05/2019   IR ANGIO INTRA EXTRACRAN SEL COM CAROTID INNOMINATE BILAT MOD SED  10/03/2021   IR ANGIO VERTEBRAL SEL VERTEBRAL UNI R MOD SED  10/03/2021   IR RADIOLOGIST EVAL & MGMT  08/19/2021   IR US  GUIDE VASC ACCESS RIGHT  10/04/2021   Patient Active Problem List   Diagnosis Date Noted   Stenosis of right carotid artery 08/14/2023   Orthostatic hypotension 08/14/2023   Supine hypertension 08/14/2023   Hypercholesteremia 08/14/2023   Pain due to onychomycosis of toenails of both feet 09/03/2022   Porokeratosis 06/04/2022   Parkinson's disease (HCC) 02/06/2021   SBO (small bowel obstruction) (HCC) 07/06/2019    ONSET DATE: 3-4 years  REFERRING DIAG: R53.1 (ICD-10-CM) - Strength loss of R26.9 (ICD-10-CM) - Gait difficulty R26.89 (ICD-10-CM) - Balance problem Z74.09 (ICD-10-CM) - Impaired functional mobility, balance, and endurance  THERAPY DIAG:  Unsteadiness on feet  Other abnormalities of gait and mobility  Muscle  weakness (generalized)  Abnormal posture  Rationale for Evaluation and Treatment: Rehabilitation  SUBJECTIVE:                                                                                                                                                                                             SUBJECTIVE STATEMENT: No falls, no pain.    Pt accompanied by: Caregiver- in waiting room   PERTINENT HISTORY: Parkinson's, Left distal supraclinoid ICA aneurysm,  mild cognitive impairment Has been doing HHPT 1x/wk  PAIN:  Are you having pain? Yes: NPRS scale: 4/10 Pain location: Base of neck Pain description: sore Aggravating factors: holding head down Relieving factors: Advil   PRECAUTIONS: None  RED FLAGS: None   WEIGHT BEARING RESTRICTIONS: No  FALLS: Has patient fallen in last 6 months? Yes. Number of falls last fell out of chair ~1.5 weeks ago; 2-3 falls   LIVING ENVIRONMENT: Lives with: lives with their family and Has a caregiver present 8a-12p daily; Wife is otherwise present Lives in: House/apartment Stairs: 3 steps to enter/exit Has following equipment at home: Vannie - 2 wheeled and Wheelchair (manual)  PLOF: Needs assistance with ADLs and Needs assistance with gait  PATIENT GOALS: Improve neck posture and balance  OBJECTIVE:     TODAY'S TREATMENT: 02/04/2024 Activity Comments  In reclined position on mat: neck stretching and neck extensor activation: Neck retraction 3 x 5 reps Hands behind head 2 x 5 reps Horizontal shoulder adduction 3 x 5 reps-addition of red band Scapular retraction, elbows bent 2 x 5 reps -Bridging 2 x 10 reps Multi modal cues, extra time  Seated postural strengthening with ball behind head: -neck retraction 3 x 5 -Horizontal shoulder adduction 2 x 10 reps Scapular retraction, elbows bent 2 x 10 reps     No resistance, then red band  Red band-cues to keep head up  LAQ 2 x 10 reps, then 10 with 2# weight Seated march 2# BLE x 10  Posterior lean in chair with attempts at holding neck with better posture  At counter: Forward step x 10 over obstacle Back step x 10 Side step x 10 over obstacle 2# ankle weight, cues for foot clearance              PATIENT EDUCATION: Education details: Reviewed cueing and focus from today's sesssion; positioning in semi-reclined position similar to recliner at home  (pt to get a recliner soon).  Scheduled w/c assessment with Damien Sjogren from [employer] [employer] to be present at *9 am 02/16/24 appt* Person educated: Patient Education method: Explanation, Demonstration, Tactile cues, and Verbal cues Education comprehension: verbalized understanding and returned demonstration   HOME EXERCISE PROGRAM: Access Code: [email or web address] URL: [email or web address] Date: 11/26/2023 Prepared by: Gellen April Earnie Starring  Exercises - Seated Thoracic Lumbar Extension with Pectoralis Stretch  - 3 x daily - 7 x weekly - 2-3 sets - 10 reps - Seated Cervical Retraction Extension Passive Loaded  - 3 x daily - 7 x weekly - 2-3 sets - 10 reps - Seated Cervical Retraction  - 1 x daily - 7 x weekly - 2 sets - 5 reps - 5 sec hold - Seated Diagonal Chops with Medicine Ball  - 1 x daily - 7 x weekly - 2 sets - 10 reps - Supine Isometric Neck Extension  - 1 x daily - 7 x weekly - 3 sets - 10 reps - 2 sec hold      Note: Objective measures were completed at Evaluation unless otherwise noted.  DIAGNOSTIC FINDINGS: n/a  COGNITION: Overall cognitive status: History of cognitive impairments - at baseline  COORDINATION: Tremors present throughout  POSTURE: rounded shoulders, forward head, and increased thoracic kyphosis  CERVICAL ROM: At rest flexed ~40 deg  Active ROM A/PROM (deg) eval  Flexion Can get chin to chest  Extension -40 to -20 deg (unable to go into full extension)  Right lateral flexion 40  Left lateral flexion 20  Right rotation 35  Left rotation 40   (Blank  rows = not tested)   LOWER EXTREMITY ROM:   WNL  LOWER EXTREMITY MMT:    MMT Right Eval Left Eval  Hip flexion 5 5  Hip extension 4 4  Hip abduction 3+ 3+  Hip adduction    Hip internal rotation    Hip external rotation    Knee flexion 4- 3+  Knee extension 5 5  Ankle dorsiflexion    Ankle plantarflexion    Ankle inversion    Ankle eversion    (Blank rows = not tested)  BED MOBILITY:  Findings: Sit to supine SBA Supine to sit SBA Multiple small scooting motions  TRANSFERS: Sit to stand: Min A  Assistive device utilized: Environmental Consultant - 2 wheeled     Stand to sit: CGA and Min A  Assistive device utilized: Environmental Consultant - 2 wheeled     Min A for stability -- will at times require multiple attempts to come to standing  GAIT: Findings: Gait Characteristics: Festination at times, step through pattern, decreased step length- Right, decreased step length- Left, decreased stance time- Right, decreased stance time- Left, shuffling, trunk flexed, poor foot clearance- Right, and poor foot clearance- Left, Distance walked: ~10', and Comments: RW  FUNCTIONAL TESTS:  5 times sit to stand: 25.84 sec -- uses UEs to push off mat table, legs braced against table to stand, mild tremors/unsteadiness, requires a few attempts to obtain momentum to stand Timed up and go (TUG): 23.06 sec -- shuffling with turns, diminished step length, required 2 attempts to use momentum to stand  PATIENT SURVEYS:  PSFS: THE PATIENT SPECIFIC FUNCTIONAL SCALE  Place score of 0-10 (0 = unable to perform activity and 10 = able to perform activity at the same level as before injury or problem)  Activity Date: 11/05/23    Holding head up 5    2.  Standing balance  5    3.       4.      Total Score 5      Total Score = Sum of activity scores/number of activities  Minimally Detectable Change: 3 points (for single activity); 2 points (for average score)  [facility] (nd). The Patient Specific Functional Scale .  Retrieved from [email or web address]                                                                                                                                  GOALS: Goals reviewed with patient? Yes  SHORT TERM GOALS: Target date: 02/02/2024     Pt will be able to perform HEP with caregiver min A Baseline: Goal status: MET 12/01/23  2.  Pt will be able to maintain neck extension to at least within 10 deg of neutral Baseline: pt is 40 to 20 deg away from neutral; 17 degrees flexion. Able to get to actively get to neutral neck posture 9.30.25 Goal status: NOT  MET 12/01/23  3.  Demo modified independent stand-pivot transfers from w/c to various surfaces to improve safety with maneuvering in small spaces  Baseline: CGA-SBA-continues to need CGA for safety 02/02/2024  Goal status: IN PROGRESS   LONG TERM GOALS: Target date: 02/23/2024     Pt will be able to perform head/neck and postural strengthening with SBA Baseline: pt and caregiver have reported understanding Goal status: IN PROGRESS 12/22/23  2.  Pt will be able to maintain neutral neck for at least 10 min for ADLs Baseline: Unable; unchanged  Goal status: IN PROGRESS 01/12/24  3.  Pt will report improved PSFS by >/=7 to demo MCID Baseline: 5; 7 12/22/23 Goal status: MET 12/22/23  4.  Pt will have improved 5x STS to </=20 sec to demo increased functional LE strength Baseline: 25.84 sec; 19.17 sec with UE; 16 sec w/ BUE Goal status: IN PROGRESS  01/12/24  5.  Pt will have improved TUG to </=15 sec to decrease fall risk Baseline: 23.06 sec; 23.93; 17 sec Goal status: IN PROGRESS 01/12/24    6.  Improve Berg Balance Test score to 45/56 to reduce risk for falls and improve safety    Baseline: 38/56    Goal status: INITIAL   7.  Caregiver to teachback freezing of gait strategies to maintain household ambulation    Baseline: not est   Goal status:  INITIAL   ASSESSMENT:  CLINICAL IMPRESSION: Pt presents today with no new complaints. Skilled PT session focused on postural strengthening in reclined position and then in sitting-pt with better postural awareness and positioning in reclined position today than last session.  Able to progress to using bands for resisted postural strengthening today; able to use ankle weights for leg strengthening and work on foot clearance today.  He continues to need cues for posture, as neck fatigues quickly into significant flexion in sitting and standing. Pt will continue to benefit from skilled PT towards goals for improved functional mobility and decreased fall risk.  OBJECTIVE IMPAIRMENTS: Abnormal gait, decreased activity tolerance, decreased balance, decreased endurance, decreased mobility, difficulty walking, decreased ROM, decreased strength, decreased safety awareness, hypomobility, increased fascial restrictions, increased muscle spasms, impaired flexibility, impaired UE functional use, improper body mechanics, postural dysfunction, and pain.   ACTIVITY LIMITATIONS: carrying, lifting, bending, sitting, standing, squatting, stairs, transfers, bed mobility, bathing, toileting, dressing, reach over head, hygiene/grooming, and locomotion level  PARTICIPATION LIMITATIONS: meal prep, cleaning, laundry, medication management, driving, shopping, community activity, and yard work  PERSONAL FACTORS: Age, Fitness, Past/current experiences, and Time since onset of injury/illness/exacerbation are also affecting patient's functional outcome.   REHAB POTENTIAL: Good  CLINICAL DECISION MAKING: Evolving/moderate complexity  EVALUATION COMPLEXITY: Moderate  PLAN:  PT FREQUENCY: 2x/week  PT DURATION: 6 weeks  PLANNED INTERVENTIONS: 97164- PT Re-evaluation, 97110-Therapeutic exercises, 97530- Therapeutic activity, 97112- Neuromuscular re-education, 97535- Self Care, [id number]- Manual therapy, [id number]- Gait training,  [phone]- Aquatic Therapy, [phone]- Electrical stimulation (unattended), [phone] (1-2 muscles), 20561 (3+ muscles)- Dry Needling, Patient/Family education, Balance training, Stair training, Taping, Joint mobilization, Spinal mobilization, Cryotherapy, and Moist heat  PLAN FOR NEXT SESSION:  *W/C assessment scheduled for 9 AM 02/16/24 with [facility]*.  Step strategy work at ryder system and consider adding to [employer].  Balance and turning work with RW.  Continue STS with correct technique. Work on deep neck muscle strength/endurance.  Posterior chain strengthening and gross balance.   Greig Anon, PT 02/04/24 9:27 AM Phone: [phone] Fax: [phone]  [facility] at [facility] [address] [place], [place] [address] Phone # [phone] Fax # [phone]

## 2024-02-08 ENCOUNTER — Encounter: Payer: Self-pay | Admitting: Podiatry

## 2024-02-08 ENCOUNTER — Ambulatory Visit: Admitting: Podiatry

## 2024-02-08 DIAGNOSIS — B351 Tinea unguium: Secondary | ICD-10-CM

## 2024-02-08 DIAGNOSIS — M79675 Pain in left toe(s): Secondary | ICD-10-CM | POA: Diagnosis not present

## 2024-02-08 DIAGNOSIS — M79674 Pain in right toe(s): Secondary | ICD-10-CM | POA: Diagnosis not present

## 2024-02-08 NOTE — Progress Notes (Signed)
This patient presents to the office for treatment of painful callus under the ball of his right foot.  He says this is painful walking and wearing his shoes.  He was referred to the office by his doctor since he has Parkinsons. He also requests his big toenails be trimmed. He presents to the office for evaluation and treatment.  Vascular  Dorsalis pedis and posterior tibial pulses are palpable  B/L.  Capillary return  WNL.  Temperature gradient is  WNL.  Skin turgor  WNL  Sensorium  Senn Weinstein monofilament wire  WNL. Normal tactile sensation.  Nail Exam  Patient has thick disfigured big toenails B/L.  Thick hallux nails  B/L.  Orthopedic  Exam  Muscle tone and muscle strength  WNL.  No limitations of motion feet  B/L.  No crepitus or joint effusion noted.  Foot type is unremarkable and digits show no abnormalities.  DJD 1st MPJ  right foot.  Skin  No open lesions.  Normal skin texture and turgor.  Onychomycosis  B/L   Debride  nails with nail nippers and dremel tool.   RTC 3 months.   Helane Gunther DPM

## 2024-02-09 ENCOUNTER — Ambulatory Visit: Admitting: Physical Therapy

## 2024-02-09 ENCOUNTER — Encounter: Payer: Self-pay | Admitting: Physical Therapy

## 2024-02-09 DIAGNOSIS — R2681 Unsteadiness on feet: Secondary | ICD-10-CM

## 2024-02-09 DIAGNOSIS — R293 Abnormal posture: Secondary | ICD-10-CM

## 2024-02-09 DIAGNOSIS — R2689 Other abnormalities of gait and mobility: Secondary | ICD-10-CM

## 2024-02-09 DIAGNOSIS — M6281 Muscle weakness (generalized): Secondary | ICD-10-CM

## 2024-02-09 NOTE — Therapy (Signed)
 OUTPATIENT PHYSICAL THERAPY TREATMENT NOTE   Patient Name: Cody Dickerson MRN: [id number] DOB:27-Jan-1951, 73 y.o., male Today's Date: 02/09/2024   PCP: Okey Carlin Redbird, MD REFERRING PROVIDER: Tat, Asberry RAMAN, DO      END OF SESSION:  PT End of Session - 02/09/24 1227     Visit Number 21    Number of Visits 27    Date for Recertification  02/23/24    Authorization Type Medicare/[employer]!!!!!!!    Progress Note Due on Visit 25    PT Start Time 1230    PT Stop Time 1324    PT Time Calculation (min) 54 min    Equipment Utilized During Treatment Gait belt    Activity Tolerance Patient tolerated treatment well    Behavior During Therapy WFL for tasks assessed/performed;Impulsive                       Past Medical History:  Diagnosis Date   Balance problem    Gout    Hypercholesterolemia    Hypertension    Parkinson's disease (HCC)    Small bowel obstruction (HCC)    Past Surgical History:  Procedure Laterality Date   BOWEL BLOCKAGE  05/2019   IR ANGIO INTRA EXTRACRAN SEL COM CAROTID INNOMINATE BILAT MOD SED  10/03/2021   IR ANGIO VERTEBRAL SEL VERTEBRAL UNI R MOD SED  10/03/2021   IR RADIOLOGIST EVAL & MGMT  08/19/2021   IR US  GUIDE VASC ACCESS RIGHT  10/04/2021   Patient Active Problem List   Diagnosis Date Noted   Stenosis of right carotid artery 08/14/2023   Orthostatic hypotension 08/14/2023   Supine hypertension 08/14/2023   Hypercholesteremia 08/14/2023   Pain due to onychomycosis of toenails of both feet 09/03/2022   Porokeratosis 06/04/2022   Parkinson's disease (HCC) 02/06/2021   SBO (small bowel obstruction) (HCC) 07/06/2019    ONSET DATE: 3-4 years  REFERRING DIAG: R53.1 (ICD-10-CM) - Strength loss of R26.9 (ICD-10-CM) - Gait difficulty R26.89 (ICD-10-CM) - Balance problem Z74.09 (ICD-10-CM) - Impaired functional mobility, balance, and endurance  THERAPY DIAG:  Unsteadiness on feet  Other abnormalities of gait and mobility  Muscle  weakness (generalized)  Abnormal posture  Rationale for Evaluation and Treatment: Rehabilitation  SUBJECTIVE:                                                                                                                                                                                             SUBJECTIVE STATEMENT: Went to visit [facility].  No falls.  Caregiver reports wife is concerned about increased forward posture in chair when he falls asleep-worried  he may fall.  Pt is concerned that his bottom slides more forward and then he has to work to sit up taller.  Pt accompanied by: Caregiver- in waiting room   PERTINENT HISTORY: Parkinson's, Left distal supraclinoid ICA aneurysm, mild cognitive impairment Has been doing HHPT 1x/wk  PAIN:  Are you having pain? Yes: NPRS scale: 4/10 Pain location: Base of neck Pain description: sore Aggravating factors: holding head down Relieving factors: Advil   PRECAUTIONS: None  RED FLAGS: None   WEIGHT BEARING RESTRICTIONS: No  FALLS: Has patient fallen in last 6 months? Yes. Number of falls last fell out of chair ~1.5 weeks ago; 2-3 falls   LIVING ENVIRONMENT: Lives with: lives with their family and Has a caregiver present 8a-12p daily; Wife is otherwise present Lives in: House/apartment Stairs: 3 steps to enter/exit Has following equipment at home: Vannie - 2 wheeled and Wheelchair (manual)  PLOF: Needs assistance with ADLs and Needs assistance with gait  PATIENT GOALS: Improve neck posture and balance  OBJECTIVE:    TODAY'S TREATMENT: 02/09/2024 Activity Comments  Worked on posture/positioning in current w/c Utilized towel roll behind back to help with more neutral spine posture and lessen posterior pelvic tilt Discussed with caregiver present using towel roll, trying to have pt perform some sitting tasks away from back of chair Discussed with caregiver, when pt is apt to fall asleep sitting up-try to have patient in position  with feet reclined, bottom at angle so he doesn't slide forward, reclined back somewhat so head can rest on pillow Discussed trying to utilize new recliner for better positioning in sitting and in resting positions  Sitting anterior pelvic tilt With assistance-difficulty dissociating trunk  Seated neck motions to try to lift chin from chest, 2 x 5 reps, 3 sec each   Forward lean to upright posture -hands behind head, 10 reps   Forward lean to upright posture-rolling ball forward/back   Forward lean to partial stand>sit, 5 reps Then fullly up to stand>sit, 5 reps With hands on therapy ball in front of him, mod assist at times to put weight forward fully through feet  Additional sit to stand from mat, with hands pushing up from mat, min assist/min guard and cues Use of visual targets on ground (for forward lean) and at wall (for upright posture upon standing)          PATIENT EDUCATION: Education details: Caregiver present towards end of session to discuss posture/positioning, new exercises, technique for sit to stand. * Scheduled w/c assessment with Damien Sjogren from [employer] [employer] to be present at *9 am 02/16/24 appt* Person educated: Patient, caregiver Education method: Explanation, Demonstration, Tactile cues, and Verbal cues Education comprehension: verbalized understanding and returned demonstration   HOME EXERCISE PROGRAM: Access Code: [email or web address] URL: [email or web address] Date: 02/09/2024 Prepared by: [facility] - Outpatient  Rehab - [facility]  Exercises - Seated Thoracic Lumbar Extension with Pectoralis Stretch  - 3 x daily - 7 x weekly - 2-3 sets - 10 reps - Seated Cervical Retraction Extension Passive Loaded  - 3 x daily - 7 x weekly - 2-3 sets - 10 reps - Seated Cervical Retraction  - 1 x daily - 7 x weekly - 2 sets - 5 reps - 5 sec hold - Seated Diagonal Chops with Medicine Ball  - 1 x daily - 7 x weekly - 2 sets - 10 reps - Supine Isometric  Neck Extension  - 1 x daily - 7 x weekly - 3 sets -  10 reps - 2 sec hold - Sit to Stand with Armchair  - 1 x daily - 7 x weekly - 3 sets - 5 reps - Seated Anterior Pelvic Tilt  - 1 x daily - 7 x weekly - 3 sets - 5 reps     Note: Objective measures were completed at Evaluation unless otherwise noted.  DIAGNOSTIC FINDINGS: n/a  COGNITION: Overall cognitive status: History of cognitive impairments - at baseline  COORDINATION: Tremors present throughout  POSTURE: rounded shoulders, forward head, and increased thoracic kyphosis  CERVICAL ROM: At rest flexed ~40 deg  Active ROM A/PROM (deg) eval  Flexion Can get chin to chest  Extension -40 to -20 deg (unable to go into full extension)  Right lateral flexion 40  Left lateral flexion 20  Right rotation 35  Left rotation 40   (Blank rows = not tested)   LOWER EXTREMITY ROM:   WNL  LOWER EXTREMITY MMT:    MMT Right Eval Left Eval  Hip flexion 5 5  Hip extension 4 4  Hip abduction 3+ 3+  Hip adduction    Hip internal rotation    Hip external rotation    Knee flexion 4- 3+  Knee extension 5 5  Ankle dorsiflexion    Ankle plantarflexion    Ankle inversion    Ankle eversion    (Blank rows = not tested)  BED MOBILITY:  Findings: Sit to supine SBA Supine to sit SBA Multiple small scooting motions  TRANSFERS: Sit to stand: Min A  Assistive device utilized: [employer] Consultant - 2 wheeled     Stand to sit: CGA and Min A  Assistive device utilized: Environmental Consultant - 2 wheeled     Min A for stability -- will at times require multiple attempts to come to standing  GAIT: Findings: Gait Characteristics: Festination at times, step through pattern, decreased step length- Right, decreased step length- Left, decreased stance time- Right, decreased stance time- Left, shuffling, trunk flexed, poor foot clearance- Right, and poor foot clearance- Left, Distance walked: ~10', and Comments: RW  FUNCTIONAL TESTS:  5 times sit to stand: 25.84 sec -- uses  UEs to push off mat table, legs braced against table to stand, mild tremors/unsteadiness, requires a few attempts to obtain momentum to stand Timed up and go (TUG): 23.06 sec -- shuffling with turns, diminished step length, required 2 attempts to use momentum to stand  PATIENT SURVEYS:  PSFS: THE PATIENT SPECIFIC FUNCTIONAL SCALE  Place score of 0-10 (0 = unable to perform activity and 10 = able to perform activity at the same level as before injury or problem)  Activity Date: 11/05/23    Holding head up 5    2.  Standing balance  5    3.       4.      Total Score 5      Total Score = Sum of activity scores/number of activities  Minimally Detectable Change: 3 points (for single activity); 2 points (for average score)  [facility] (nd). The Patient Specific Functional Scale . Retrieved from [email or web address]  GOALS: Goals reviewed with patient? Yes  SHORT TERM GOALS: Target date: 02/02/2024     Pt will be able to perform HEP with caregiver min A Baseline: Goal status: MET 12/01/23  2.  Pt will be able to maintain neck extension to at least within 10 deg of neutral Baseline: pt is 40 to 20 deg away from neutral; 17 degrees flexion. Able to get to actively get to neutral neck posture 9.30.25 Goal status: NOT MET 12/01/23  3.  Demo modified independent stand-pivot transfers from w/c to various surfaces to improve safety with maneuvering in small spaces  Baseline: CGA-SBA-continues to need CGA for safety 02/02/2024  Goal status: IN PROGRESS   LONG TERM GOALS: Target date: 02/23/2024     Pt will be able to perform head/neck and postural strengthening with SBA Baseline: pt and caregiver have reported understanding Goal status: IN PROGRESS 12/22/23  2.  Pt will be able to maintain  neutral neck for at least 10 min for ADLs Baseline: Unable; unchanged  Goal status: IN PROGRESS 01/12/24  3.  Pt will report improved PSFS by >/=7 to demo MCID Baseline: 5; 7 12/22/23 Goal status: MET 12/22/23  4.  Pt will have improved 5x STS to </=20 sec to demo increased functional LE strength Baseline: 25.84 sec; 19.17 sec with UE; 16 sec w/ BUE Goal status: IN PROGRESS  01/12/24  5.  Pt will have improved TUG to </=15 sec to decrease fall risk Baseline: 23.06 sec; 23.93; 17 sec Goal status: IN PROGRESS 01/12/24    6.  Improve Berg Balance Test score to 45/56 to reduce risk for falls and improve safety    Baseline: 38/56    Goal status: INITIAL   7.  Caregiver to teachback freezing of gait strategies to maintain household ambulation    Baseline: not est   Goal status: INITIAL   ASSESSMENT:  CLINICAL IMPRESSION: Pt presents today with questions from patient (about sit to stand) and from caregiver (about prolonged positioning sitting in w/c at home).   Skilled PT session focused on practicing with patient and educating pt/caregiver about postioning to help with posture and to lessen pt's tipping forward when sleeping sitting up due to extreme forward head posture.  Practiced ways pt can work to strengthen back and dissociate back/pelvis motions, but pt is quite tight/locked into posterior pelvic tilt and has continued tendency for retropulsion through trunk. Pt/caregiver verbalize understanding of education provided.  Pt will continue to benefit from skilled PT towards goals for improved functional mobility and decreased fall risk.  OBJECTIVE IMPAIRMENTS: Abnormal gait, decreased activity tolerance, decreased balance, decreased endurance, decreased mobility, difficulty walking, decreased ROM, decreased strength, decreased safety awareness, hypomobility, increased fascial restrictions, increased muscle spasms, impaired flexibility, impaired UE functional use, improper body mechanics,  postural dysfunction, and pain.   ACTIVITY LIMITATIONS: carrying, lifting, bending, sitting, standing, squatting, stairs, transfers, bed mobility, bathing, toileting, dressing, reach over head, hygiene/grooming, and locomotion level  PARTICIPATION LIMITATIONS: meal prep, cleaning, laundry, medication management, driving, shopping, community activity, and yard work  PERSONAL FACTORS: Age, Fitness, Past/current experiences, and Time since onset of injury/illness/exacerbation are also affecting patient's functional outcome.   REHAB POTENTIAL: Good  CLINICAL DECISION MAKING: Evolving/moderate complexity  EVALUATION COMPLEXITY: Moderate  PLAN:  PT FREQUENCY: 2x/week  PT DURATION: 6 weeks  PLANNED INTERVENTIONS: 97164- PT Re-evaluation, 97110-Therapeutic exercises, 97530- Therapeutic activity, [id number]- Neuromuscular re-education, 97535- Self Care, [id number]- Manual therapy, [id number]- Gait training, [phone]- Aquatic Therapy, [phone]- Electrical stimulation (unattended), 79439 (1-2 muscles), 79438 (  3+ muscles)- Dry Needling, Patient/Family education, Balance training, Stair training, Taping, Joint mobilization, Spinal mobilization, Cryotherapy, and Moist heat  PLAN FOR NEXT SESSION: Review posture and positioning discussed this visit.  *W/C assessment scheduled for 9 AM 02/16/24 with [employer]*.  Step strategy work at [employer]der system and consider adding to [employer].  Balance and turning work with RW.  Continue STS with correct technique. Work on deep neck muscle strength/endurance.  Posterior chain strengthening and gross balance.   Greig Anon, PT 02/09/24 1:31 PM Phone: [phone] Fax: [phone]  [facility] at [facility] [address] [place], [address] [place], [place] [address] Phone # [phone] Fax # [phone]

## 2024-02-10 DIAGNOSIS — M1A9XX1 Chronic gout, unspecified, with tophus (tophi): Secondary | ICD-10-CM | POA: Diagnosis not present

## 2024-02-10 DIAGNOSIS — Z125 Encounter for screening for malignant neoplasm of prostate: Secondary | ICD-10-CM | POA: Diagnosis not present

## 2024-02-10 DIAGNOSIS — Z79899 Other long term (current) drug therapy: Secondary | ICD-10-CM | POA: Diagnosis not present

## 2024-02-10 DIAGNOSIS — E7800 Pure hypercholesterolemia, unspecified: Secondary | ICD-10-CM | POA: Diagnosis not present

## 2024-02-11 ENCOUNTER — Ambulatory Visit: Admitting: Physical Therapy

## 2024-02-11 ENCOUNTER — Other Ambulatory Visit: Payer: Self-pay | Admitting: Neurology

## 2024-02-11 ENCOUNTER — Encounter: Payer: Self-pay | Admitting: Physical Therapy

## 2024-02-11 DIAGNOSIS — R2681 Unsteadiness on feet: Secondary | ICD-10-CM

## 2024-02-11 DIAGNOSIS — R2689 Other abnormalities of gait and mobility: Secondary | ICD-10-CM

## 2024-02-11 DIAGNOSIS — M6281 Muscle weakness (generalized): Secondary | ICD-10-CM

## 2024-02-11 DIAGNOSIS — G20A1 Parkinson's disease without dyskinesia, without mention of fluctuations: Secondary | ICD-10-CM

## 2024-02-11 DIAGNOSIS — R293 Abnormal posture: Secondary | ICD-10-CM

## 2024-02-11 NOTE — Therapy (Signed)
 OUTPATIENT PHYSICAL THERAPY TREATMENT NOTE   Patient Name: Cody Dickerson MRN: [id number] DOB:03-27-1950, 73 y.o., male Today's Date: 02/11/2024   PCP: Okey Carlin Redbird, MD REFERRING PROVIDER: Tat, Asberry RAMAN, DO      END OF SESSION:  PT End of Session - 02/11/24 0932     Visit Number 22    Number of Visits 27    Date for Recertification  02/23/24    Authorization Type Medicare/[employer]!!!!!!!    Progress Note Due on Visit 25    PT Start Time 0933    PT Stop Time 1013    PT Time Calculation (min) 40 min    Equipment Utilized During Treatment Gait belt    Activity Tolerance Patient tolerated treatment well    Behavior During Therapy [facility] for tasks assessed/performed;Impulsive                        Past Medical History:  Diagnosis Date   Balance problem    Gout    Hypercholesterolemia    Hypertension    Parkinson's disease (HCC)    Small bowel obstruction (HCC)    Past Surgical History:  Procedure Laterality Date   BOWEL BLOCKAGE  05/2019   IR ANGIO INTRA EXTRACRAN SEL COM CAROTID INNOMINATE BILAT MOD SED  10/03/2021   IR ANGIO VERTEBRAL SEL VERTEBRAL UNI R MOD SED  10/03/2021   IR RADIOLOGIST EVAL & MGMT  08/19/2021   IR US  GUIDE VASC ACCESS RIGHT  10/04/2021   Patient Active Problem List   Diagnosis Date Noted   Stenosis of right carotid artery 08/14/2023   Orthostatic hypotension 08/14/2023   Supine hypertension 08/14/2023   Hypercholesteremia 08/14/2023   Pain due to onychomycosis of toenails of both feet 09/03/2022   Porokeratosis 06/04/2022   Parkinson's disease (HCC) 02/06/2021   SBO (small bowel obstruction) (HCC) 07/06/2019    ONSET DATE: 3-4 years  REFERRING DIAG: R53.1 (ICD-10-CM) - Strength loss of R26.9 (ICD-10-CM) - Gait difficulty R26.89 (ICD-10-CM) - Balance problem Z74.09 (ICD-10-CM) - Impaired functional mobility, balance, and endurance  THERAPY DIAG:  Unsteadiness on feet  Other abnormalities of gait and mobility  Muscle  weakness (generalized)  Abnormal posture  Rationale for Evaluation and Treatment: Rehabilitation  SUBJECTIVE:                                                                                                                                                                                             SUBJECTIVE STATEMENT: Got the new recliner at home.  Sat in it for a short time last night.  Pt accompanied by: Caregiver- in  waiting room   PERTINENT HISTORY: Parkinson's, Left distal supraclinoid ICA aneurysm, mild cognitive impairment Has been doing HHPT 1x/wk  PAIN:  Are you having pain? Yes: NPRS scale: 4/10 Pain location: Base of neck Pain description: sore Aggravating factors: holding head down Relieving factors: Advil   PRECAUTIONS: None  RED FLAGS: None   WEIGHT BEARING RESTRICTIONS: No  FALLS: Has patient fallen in last 6 months? Yes. Number of falls last fell out of chair ~1.5 weeks ago; 2-3 falls   LIVING ENVIRONMENT: Lives with: lives with their family and Has a caregiver present 8a-12p daily; Wife is otherwise present Lives in: House/apartment Stairs: 3 steps to enter/exit Has following equipment at home: Vannie - 2 wheeled and Wheelchair (manual)  PLOF: Needs assistance with ADLs and Needs assistance with gait  PATIENT GOALS: Improve neck posture and balance  OBJECTIVE:    TODAY'S TREATMENT: 02/11/2024 Activity Comments  Reviewed posture and positioning info from last session -Used towel roll in w/c with success -did some unsupported sitting  Posture able to achieve 25 degrees of neck flexion (from rest position of 55 degrees) Chin rests on chest-able to lift 4 cm from chest  Seated ant pelvic tilts, 3 x 5 reps With tactile cues  Neck retraction/cervical extension 2 x 5  Towel behind neck/head for cueing-pt very limited against gravity  Bed mobility practice:   Hooklying position rocking, reach across and use (simulated) bed rail to go supine>sidelying>sit  EOB Pt describes difficulty with BLE management  getting out of bed  Sit to stand, rock with stagger stance, then forward/back stepping 5 ft Min assist/min guard, multiple reps, with cues for slowed pace backward steps, then move RW         UE strength testing:   R  L   UE ROM   R   L Shoulder flexion   4/5  4/5       132   120 Shoulder abduction   3+/5  3+/5       135   105 (pain) Elbow flex/extension  4/5  4/5        PATIENT EDUCATION: Education details: Bed mobility technique, foot position and backwards stepping/walking * Scheduled w/c assessment with Damien Sjogren from [employer] [employer] to be present at *9 am 02/16/24 appt* Person educated: Patient, caregiver Education method: Explanation, Demonstration, Tactile cues, and Verbal cues Education comprehension: verbalized understanding and returned demonstration   HOME EXERCISE PROGRAM: Access Code: [email or web address] URL: [email or web address] Date: 02/09/2024 Prepared by: [facility] - Outpatient  Rehab - [facility]  Exercises - Seated Thoracic Lumbar Extension with Pectoralis Stretch  - 3 x daily - 7 x weekly - 2-3 sets - 10 reps - Seated Cervical Retraction Extension Passive Loaded  - 3 x daily - 7 x weekly - 2-3 sets - 10 reps - Seated Cervical Retraction  - 1 x daily - 7 x weekly - 2 sets - 5 reps - 5 sec hold - Seated Diagonal Chops with Medicine Ball  - 1 x daily - 7 x weekly - 2 sets - 10 reps - Supine Isometric Neck Extension  - 1 x daily - 7 x weekly - 3 sets - 10 reps - 2 sec hold - Sit to Stand with Armchair  - 1 x daily - 7 x weekly - 3 sets - 5 reps - Seated Anterior Pelvic Tilt  - 1 x daily - 7 x weekly - 3 sets - 5 reps     Note: Objective measures  were completed at Evaluation unless otherwise noted.  DIAGNOSTIC FINDINGS: n/a  COGNITION: Overall cognitive status: History of cognitive impairments - at baseline  COORDINATION: Tremors present throughout  POSTURE: rounded shoulders,  forward head, and increased thoracic kyphosis  CERVICAL ROM: At rest flexed ~40 deg  Active ROM A/PROM (deg) eval  Flexion Can get chin to chest  Extension -40 to -20 deg (unable to go into full extension)  Right lateral flexion 40  Left lateral flexion 20  Right rotation 35  Left rotation 40   (Blank rows = not tested)   LOWER EXTREMITY ROM:   WNL  LOWER EXTREMITY MMT:    MMT Right Eval Left Eval  Hip flexion 5 5  Hip extension 4 4  Hip abduction 3+ 3+  Hip adduction    Hip internal rotation    Hip external rotation    Knee flexion 4- 3+  Knee extension 5 5  Ankle dorsiflexion    Ankle plantarflexion    Ankle inversion    Ankle eversion    (Blank rows = not tested)  BED MOBILITY:  Findings: Sit to supine SBA Supine to sit SBA Multiple small scooting motions  TRANSFERS: Sit to stand: Min A  Assistive device utilized: [employer] - 2 wheeled     Stand to sit: CGA and Min A  Assistive device utilized: Environmental Consultant - 2 wheeled     Min A for stability -- will at times require multiple attempts to come to standing  GAIT: Findings: Gait Characteristics: Festination at times, step through pattern, decreased step length- Right, decreased step length- Left, decreased stance time- Right, decreased stance time- Left, shuffling, trunk flexed, poor foot clearance- Right, and poor foot clearance- Left, Distance walked: ~10', and Comments: RW  FUNCTIONAL TESTS:  5 times sit to stand: 25.84 sec -- uses UEs to push off mat table, legs braced against table to stand, mild tremors/unsteadiness, requires a few attempts to obtain momentum to stand Timed up and go (TUG): 23.06 sec -- shuffling with turns, diminished step length, required 2 attempts to use momentum to stand  PATIENT SURVEYS:  PSFS: THE PATIENT SPECIFIC FUNCTIONAL SCALE  Place score of 0-10 (0 = unable to perform activity and 10 = able to perform activity at the same level as before injury or problem)  Activity Date: 11/05/23     Holding head up 5    2.  Standing balance  5    3.       4.      Total Score 5      Total Score = Sum of activity scores/number of activities  Minimally Detectable Change: 3 points (for single activity); 2 points (for average score)  [facility] (nd). The Patient Specific Functional Scale . Retrieved from [email or web address]  GOALS: Goals reviewed with patient? Yes  SHORT TERM GOALS: Target date: 02/02/2024     Pt will be able to perform HEP with caregiver min A Baseline: Goal status: MET 12/01/23  2.  Pt will be able to maintain neck extension to at least within 10 deg of neutral Baseline: pt is 40 to 20 deg away from neutral; 17 degrees flexion. Able to get to actively get to neutral neck posture 9.30.25 Goal status: NOT MET 12/01/23  3.  Demo modified independent stand-pivot transfers from w/c to various surfaces to improve safety with maneuvering in small spaces  Baseline: CGA-SBA-continues to need CGA for safety 02/02/2024  Goal status: IN PROGRESS   LONG TERM GOALS: Target date: 02/23/2024     Pt will be able to perform head/neck and postural strengthening with SBA Baseline: pt and caregiver have reported understanding Goal status: IN PROGRESS 12/22/23  2.  Pt will be able to maintain neutral neck for at least 10 min for ADLs Baseline: Unable; unchanged  Goal status: IN PROGRESS 01/12/24  3.  Pt will report improved PSFS by >/=7 to demo MCID Baseline: 5; 7 12/22/23 Goal status: MET 12/22/23  4.  Pt will have improved 5x STS to </=20 sec to demo increased functional LE strength Baseline: 25.84 sec; 19.17 sec with UE; 16 sec w/ BUE Goal status: IN PROGRESS  01/12/24  5.  Pt will have improved TUG to </=15 sec to decrease fall risk Baseline: 23.06 sec; 23.93; 17  sec Goal status: IN PROGRESS 01/12/24    6.  Improve Berg Balance Test score to 45/56 to reduce risk for falls and improve safety    Baseline: 38/56    Goal status: INITIAL   7.  Caregiver to teachback freezing of gait strategies to maintain household ambulation    Baseline: not est   Goal status: INITIAL   ASSESSMENT:  CLINICAL IMPRESSION: Pt presents today with no new reports; he does request to work on bed mobility, as he has a hard time getting out of bed without assistance. Skilled PT session focused on postural exercises and retraining (however, in sitting, pt continues to have difficulty with antigravity extension and holding head away from chest). Worked on bed mobility and worked on initiating gait/backwards walking.  With specific cues and min assist, pt able to demo improved safety with stepping backwards with RW; however, pt quickly reverts to smaller, shuffling steps with retropulsion when cues removed.  Pt will continue to benefit from skilled PT towards goals for improved functional mobility and decreased fall risk.  OBJECTIVE IMPAIRMENTS: Abnormal gait, decreased activity tolerance, decreased balance, decreased endurance, decreased mobility, difficulty walking, decreased ROM, decreased strength, decreased safety awareness, hypomobility, increased fascial restrictions, increased muscle spasms, impaired flexibility, impaired UE functional use, improper body mechanics, postural dysfunction, and pain.   ACTIVITY LIMITATIONS: carrying, lifting, bending, sitting, standing, squatting, stairs, transfers, bed mobility, bathing, toileting, dressing, reach over head, hygiene/grooming, and locomotion level  PARTICIPATION LIMITATIONS: meal prep, cleaning, laundry, medication management, driving, shopping, community activity, and yard work  PERSONAL FACTORS: Age, Fitness, Past/current experiences, and Time since onset of injury/illness/exacerbation are also affecting patient's functional  outcome.   REHAB POTENTIAL: Good  CLINICAL DECISION MAKING: Evolving/moderate complexity  EVALUATION COMPLEXITY: Moderate  PLAN:  PT FREQUENCY: 2x/week  PT DURATION: 6 weeks  PLANNED INTERVENTIONS: 97164- PT Re-evaluation, 97110-Therapeutic exercises, 97530- Therapeutic activity, [id number]- Neuromuscular re-education, 97535- Self Care, [id number]- Manual therapy, [id number]- Gait training, [phone]- Aquatic Therapy, [phone]- Electrical stimulation (unattended), [phone] (1-2 muscles),  [id number] (3+ muscles)- Dry Needling, Patient/Family education, Balance training, Stair training, Taping, Joint mobilization, Spinal mobilization, Cryotherapy, and Moist heat  PLAN FOR NEXT SESSION:  *W/C assessment scheduled for 9 AM 02/16/24 with [employer]*.  Step strategy work at [employer] system and consider adding to [employer].  Balance and turning work with RW.  Continue STS with correct technique. Work on deep neck muscle strength/endurance.  Posterior chain strengthening and gross balance.   Greig Anon, PT 02/11/2024 10:15 AM Phone: [phone] Fax: [phone]  [facility] at [facility] [address] [place], [address] [place], [place] [address] Phone # [phone] Fax # [phone]

## 2024-02-16 ENCOUNTER — Ambulatory Visit: Admitting: Physical Therapy

## 2024-02-16 DIAGNOSIS — R2681 Unsteadiness on feet: Secondary | ICD-10-CM | POA: Diagnosis not present

## 2024-02-16 DIAGNOSIS — R293 Abnormal posture: Secondary | ICD-10-CM

## 2024-02-16 DIAGNOSIS — M6281 Muscle weakness (generalized): Secondary | ICD-10-CM

## 2024-02-16 DIAGNOSIS — R2689 Other abnormalities of gait and mobility: Secondary | ICD-10-CM

## 2024-02-16 NOTE — Therapy (Addendum)
 OUTPATIENT PHYSICAL THERAPY WHEELCHAIR EVALUATION   Patient Name: Cody Dickerson MRN: [id number] DOB:[birth year], 73 y.o., male Today's Date: 02/16/2024  END OF SESSION:  PT End of Session - 02/16/24 0952     Visit Number 23   w/c visit   Number of Visits 27    Date for Recertification  02/23/24    Authorization Type Medicare/AARP-KX!!!!!!!    Progress Note Due on Visit 25    PT Start Time 0900    PT Stop Time 0953    PT Time Calculation (min) 53 min    Equipment Utilized During Treatment Gait belt    Activity Tolerance Patient tolerated treatment well    Behavior During Therapy WFL for tasks assessed/performed;Impulsive          Past Medical History:  Diagnosis Date   Balance problem    Gout    Hypercholesterolemia    Hypertension    Parkinson's disease (HCC)    Small bowel obstruction (HCC)    Past Surgical History:  Procedure Laterality Date   BOWEL BLOCKAGE  05/2019   IR ANGIO INTRA EXTRACRAN SEL COM CAROTID INNOMINATE BILAT MOD SED  10/03/2021   IR ANGIO VERTEBRAL SEL VERTEBRAL UNI R MOD SED  10/03/2021   IR RADIOLOGIST EVAL & MGMT  08/19/2021   IR US  GUIDE VASC ACCESS RIGHT  10/04/2021   Patient Active Problem List   Diagnosis Date Noted   Stenosis of right carotid artery 08/14/2023   Orthostatic hypotension 08/14/2023   Supine hypertension 08/14/2023   Hypercholesteremia 08/14/2023   Pain due to onychomycosis of toenails of both feet 09/03/2022   Porokeratosis 06/04/2022   Parkinson's disease (HCC) 02/06/2021   SBO (small bowel obstruction) (HCC) 07/06/2019    PCP: Okey Carlin Redbird, MD   REFERRING PROVIDER:   Evonnie Asberry RAMAN, DO    THERAPY DIAG:  Unsteadiness on feet  Other abnormalities of gait and mobility  Muscle weakness (generalized)  Abnormal posture  Rationale for Evaluation and Treatment Rehabilitation  SUBJECTIVE:                                                                                                                                                                                            SUBJECTIVE STATEMENT: Pt presents for wheelchair evaluation. Hx of approx 20 falls in past 6 months.  He has hx of Parkinson's disease, with postural changes, decreased postural stability, decreased strength, decreased balance.  He has abnormal posture with forward flexed posture, decreased ability for trunk dissociation and retropulsion with transfers and gait, shuffling gait pattern.  PRECAUTIONS: Fall and Other: significant forward flexed head/neck posture  RED  FLAGS: None  WEIGHT BEARING RESTRICTIONS No    OCCUPATION: Retired  PLOF:  Needs assistance with ADLs, Needs assistance with gait, and Has caregiver  PATIENT GOALS: To have appropriate and safe w/c for home use.         MEDICAL HISTORY:  Primary diagnosis onset: December 2022     Medical Diagnosis with ICD-10 code: R53.1 (ICD-10-CM) - Strength loss of R26.9 (ICD-10-CM) - Gait difficulty R26.89 (ICD-10-CM) - Balance problem Z74.09 (ICD-10-CM) - Impaired functional mobility, balance, and endurance    [x] Progressive disease  Relevant future surgeries: NA    Height: 5'9 Weight: 160 lbs Explain recent changes or trends in weight:      History:  Past Medical History:  Diagnosis Date   Balance problem    Gout    Hypercholesterolemia    Hypertension    Parkinson's disease (HCC)    Small bowel obstruction (HCC)        Cardio Status:  Functional Limitations:   [x] Intact  []  Impaired      Respiratory Status:  Functional Limitations:   [x] Intact  [] Impaired   [] SOB [] COPD [] O2 Dependent ______LPM  [] Ventilator Dependent  Resp equip:                                                     Objective Measure(s):   Orthotics: NA  [] Amputee:                                                             [] Prosthesis:        HOME ENVIRONMENT:  [x] House [] Condo/town home [] Apartment [] Asst living [] LTCF         [] Own  [] Rent   [] Lives alone [x] Lives with others  -        Lives with wife; has caregiver present daily for 4 hours                     Hours without assistance: up to several hours/day; has fallen when caregiver/family not present  [x] Home is accessible to patient                                3 steps to enter; no current ramp; standard doorways Storage of wheelchair:  [] In home   [] Other Comments:        COMMUNITY :  TRANSPORTATION:  [x] Car [] Dentist w/c Lift []  Ambulance [] Other:                     [] Sits in wheelchair during transport   Where is w/c stored during transport?  [] [place] [place]  []  [employer]  r   [] Self-Driver       Drive while in  Biomedical Scientist [] yes [x] no   Employment and/or school:  Specific requirements pertaining to mobility        Other:  COMMUNICATION:  Verbal Communication  [x] WFL [] receptive [] WFL [] expressive [] Understandable  [] Difficult to understand  [] non-communicative  Primary Language:______________ 2nd:_____________  Communication provided by:[x] Patient [] Family [x] Caregiver [] Translator   [] Uses an augmentative communication device  Manufacturer/Model :                                                                MOBILITY/BALANCE:  Sitting Balance  Standing Balance  Transfers  Ambulation   [] WFL      [] WFL  [] Independent  []  Independent   [x] Uses UE for balance in sitting Comments:  UE support at walker in front or at sides; significant forward flexed head/neck posture  [x] Uses UE/device for stability Comments:  Pt has tendency for retropulsion upon standing; used RW or rollator [x]  Min assist -shuffling during transfers; tendency for retropulsion []  Ambulates independently with       device:___________________      []  Mod assist  []  Able to ambulate ______ feet        safely/functionally/independently   []  Min assist  [x]  Min assist  []  Max assist  [x]  Non-functional ambulator         History/High risk of falls 20 falls; increased fall risk per TUG23.06 sec  []   Mod assist  []  Mod assist  []  Dependent  []  Unable to ambulate   []  Max  assist  []  Max assist  Transfer method:[] 1 person [] 2 person [] sliding board [] squat pivot [] stand pivot [] mechanical patient lift  [] other:   []  Unable  []  Unable    Fall History: # of falls in the past 6 months? At least 20 falls # of near falls in the past 6 months? multiple    CURRENT SEATING / MOBILITY:  Current Mobility Device: [x] None [] Cane/Walker [] Manual [] Dependent [] Dependent w/ Tilt rScooter  [] Power (type of control):   Manufacturer:  Model:  Serial #:   Size:  Color:  Age:   Purchased by whom:   Current condition of mobility base:    Current seating system:                                                                       Age of seating system:    Describe posture in present seating system:    Is the current mobility meeting medical necessity?:  [] Yes [] No Describe:                                     Ability to complete Mobility-Related Activities of Daily Living (MRADL's) with Current Mobility Device:   Move room to room  [] Independent  [x] Min [] Mod [] Max assist  [] Unable  Comments: uses RW and performs the above activities with caregiver/wife assist; if no assist, pt would be unable/unsafe and would likely fall  Meal prep  [] Independent  [] Min [] Mod [] Max assist  [x] Unable    Feeding  [x] Independent  [] Min [] Mod [] Max assist  [] Unable    Bathing  [] Independent  [x] Min [] Mod [] Max assist  [] Unable    Grooming  [] Independent  [x] Min [] Mod [] Max assist  [] Unable    UE dressing  [] Independent  [x] Min [] Mod [] Max assist  [] Unable    LE  dressing  [] Independent   [x] Min [] Mod [] Max assist  [] Unable    Toileting  [] Independent  [x] Min [] Mod [] Max assist  [] Unable    Bowel Mgt: []  Continent []  Incontinent [x]  Accidents []  Diapers []  Colostomy []  Bowel Program:  Bladder Mgt: []  Continent []  Incontinent [x]  Accidents []  Diapers []  Urinal []  Intermittent Cath []  Indwelling Cath []  Supra-pubic Cath      Current Mobility Equipment Trialed/ Ruled Out:    Does not meet mobility needs due to:    Mark all boxes that indicate inability to use the specific equipment listed     Meets needs for safe  independent functional  ambulation  / mobility    Risk of  Falling or History of Falls    Enviromental limitations      Cognition    Safety concerns with  physical ability    Decreased / limitations endurance  & strength     Decreased / limitations  motor skills  & coordination    Pain    Pace /  Speed    Cardiac and/or  respiratory condition    Contra - indicated by diagnosis   Cane/Crutches  []   [x]   []   []   []   []   []   []   []   []   [x]    Walker / Rollator  []  NA   []   [x]   []   []   [x]   []   []   []   []   []   [x]     Manual Wheelchair K0001-K0007:  []  NA  []   []   [x]   []   [x]   [x]   [x]   []   [x]   []   []    Manual W/C (K0005) with power assist  []  NA  [x]   []   []   []   []   []   []   []   []   []   []    Scooter  [x]  NA  []   []   []   []   []   []   []   []   []   []   []    Power Wheelchair: standard joystick  [x]  NA  []   []   []   []   []   []   []   []   []   []   []    Power Wheelchair: alternative controls  [x]  NA  []   []   []   []   []   []   []   []   []   []   []    Summary:  The least costly alternative for independent functional mobility was found to be:    []  Crutch/Cane  []  Walker [x]  Manual w/c  []  Manual w/c with power assist   []  Scooter   []  Power w/c std joystick   []  Power w/c alternative control        []  Requires dependent care mobility device   Cabin Crew for [employer] skills are adequate for safe mobility equipment operation  [x]   Yes []   No  Patient is willing and motivated to use recommended mobility equipment  [x]   Yes []   No       []  Patient is unable to safely operate mobility equipment independently and requires dependent care equipment Comments:           SENSATION and SKIN ISSUES:  Sensation [x]  Intact  []  Impaired []  Absent []  Hyposensate []   Hypersensate  []  Defensiveness  Location(s) of impairment:    Pressure Relief Method(s):  []  Lean side to side to offload (without risk of falling)  []   W/C push up (4+ times/hour for 15+  seconds) []  Stand up (without risk of falling)    [x]  Other: (Describe): with assistance; changes positions; cannot independently perform without assistance; places at high risk for skin break down Effective pressure relief method(s) above can be performed consistently throughout the day: [] Yes  [x]  No If not, Why?: needs assistance  Skin Integrity Risk:       []  Low risk           []  Moderate risk            [x]  High risk  If high risk, explain: with assistance; changes positions; cannot independently perform without assistance; places at high risk for skin break down  Skin Issues/Skin Integrity  Current skin Issues  []  Yes [x]  No []  Intact  []   Red area   []   Open area  []  Scar tissue  [x]  At risk from prolonged sitting  Where:buttocks History of Skin Issues  []  Yes [x]  No Where : When: Stage: Hx of skin flap surgeries  []  Yes [x]  No Where:  When:  Pain: [x]  Yes []  No   Pain Location(s): neck Intensity scale: (0-10) :4/10 How does pain interfere with mobility and/or MRADLs? - yes-pain with ADLs        MAT EVALUATION:  Neuro-Muscular Status: (Tone, Reflexive, Responses, etc.)     []   Intact   []  Spasticity:  []  Hypotonicity  []  Fluctuating  []  Muscle Spasms  [x]  Poor Righting Reactions/Poor Equilibrium Reactions  []  Primal Reflex(s):    Comments: Retropulsion with gait and transfers; decreased ability for stepping strategy for balance without extra time and assistance           COMMENTS:    POSTURE:     Comments:  Pelvis Anterior/Posterior:  []  Neutral   [x]  Posterior  []  Anterior  []  Fixed - No movement [x]  Tendency away from neutral [x]  Flexible []  Self-correction [x]  External correction Obliquity (viewed from front)  [x]  WFL []  R Obliquity []  L Obliquity  []   Fixed - No movement []  Tendency away from neutral []  Flexible []  Self-correction []  External correction Rotation  [x]  WFL []  R anterior []  L anterior  []  Fixed - No movement []  Tendency away from neutral []  Flexible []  Self-correction []  External correction Tonal Influence Pelvis:  []  Normal []  Flaccid []  Low tone []  Spasticity []  Dystonia []  Pelvis thrust []  Other:  Tendency for retropulsion; moves trunk en block-difficulty to dissociate upper trunk/lower trunk/neck  Trunk Anterior/Posterior:  []  WFL [x]  Thoracic kyphosis [x]  Lumbar lordosis  []  Fixed - No movement []  Tendency away from neutral [x]  Flexible-minimal []  Self-correction [x]  External correction  [x]  WFL []  Convex to left  []  Convex to right []  S-curve   []  C-curve []  Multiple curves []  Tendency away from neutral []  Flexible []  Self-correction []  External correction Rotation of shoulders and upper trunk:  [x]  Neutral []  Left-anterior []  Right- anterior []  Fixed- no movement []  Tendency away from neutral []  Flexible []  Self correction []  External correction Tonal influence Trunk:  []  Normal []  Flaccid []  Low tone []  Spasticity [x]  Dystonia []  Other:   Head & Neck  []  Functional [x]  Flexed    []  Extended []  Rotated right  []  Rotated left []  Laterally flexed right []  Laterally flexed left []  Cervical hyperextension   []  Good head control []  Adequate head control [x]  Limited head control []  Absent head control Describe tone/movement of head and neck: significant neck flexion-limited antigravity extension.  Chin is resting at chest; able to  briefly lift head away from chest     Lower Extremity Measurements: LE ROM:  WFL in sitting, except tightness in hamstrings  Active ROM Right 02/16/2024 Left 02/16/2024  Hip flexion    Hip extension    Hip abduction    Hip adduction    Knee flexion    Knee extension -10 -10  Ankle dorsiflexion 10 5  Ankle plantarflexion     (Blank  rows = not tested)  LE MMT:  MMT Right 02/16/2024 Left 02/16/2024  Hip flexion 5 5  Hip extension 4 4  Hip abduction 3+ 3+  Hip adduction    Knee flexion 4- 3+  Knee extension 5 5  Ankle dorsiflexion    Ankle plantarflexion     (Blank rows = not tested)  Hip positions:  [x]  Neutral   []  Abducted   []  Adducted  []  Subluxed   []  Dislocated   []  Fixed   []  Tendency away from neutral []  Flexible []  Self-correction []  External correction   Hip Windswept:[x]  Neutral  []  Right    []  Left  []  Subluxed   []  Dislocated   []  Fixed   []  Tendency away from neutral []  Flexible []  Self-correction []  External correction  LE Tone: Mild increase in tone bilaterally []  Normal []  Low tone []  Spasticity []  Flaccid []  Dystonia []  Rocks/Extends at hip []  Thrust into knee extension []  Pushes legs downward into footrest  Foot positioning: ROM Concerns: Not currently Dorsiflexed: []  Right   []  Left Plantar flexed: []  Right    []  Left Inversion: []  Right    []  Left Eversion: []  Right    []  Left  LE Edema: []  1+ (Barely detectable impression when finger is pressed into skin) [x]  2+ (slight indentation. 15 seconds to rebound) []  3+ (deeper indentation. 30 seconds to rebound) []  4+ (>30 seconds to rebound)  UE Measurements:  UPPER EXTREMITY ROM:   Active ROM Right 02/16/2024 Left 02/16/2024  Shoulder flexion 132 120  Shoulder abduction 135 105  Shoulder adduction    Elbow flexion    Elbow extension    Wrist flexion    Wrist extension    (Blank rows = not tested)  UPPER EXTREMITY MMT:  MMT Right 02/16/2024 Left 02/16/2024  Shoulder flexion 4 4  Shoulder abduction 3+ 3+  Shoulder adduction    Elbow flexion 4 4  Elbow extension 4 4  Wrist flexion    Wrist extension    Pinch strength    Grip strength    (Blank rows = not tested)  Shoulder Posture:  Right Tendency towards Left  []   Functional []    []   Elevation []    []   Depression []    [x]   Protraction [x]     []   Retraction []    [x]   Internal rotation [x]    []   External rotation []    []   Subluxed []     UE Tone: []  Normal []  Flaccid []  Low tone []  Spasticity  []  Dystonia [x]  Other: Parkinson's tremors  UE Edema: NA []  1+ (Barely detectable impression when finger is pressed into skin) []  2+ (slight indentation. 15 seconds to rebound) []  3+ (deeper indentation. 30 seconds to rebound) []  4+ (>30 seconds to rebound)  Wrist/Hand: Handedness: []  Right   []  Left   []  NA: Comments:  Right  Left  []   WNL []    []   Limitations []    []   Contractures []    []   Fisting []    [x]   Tremors [x]    []   Weak grasp []    []   Poor dexterity []    []   Hand movement non functional []    []   Paralysis []         MOBILITY BASE RECOMMENDATIONS and JUSTIFICATION:  MOBILITY BASE  JUSTIFICATION   Manufacturer:   [employer] Model:           A6                   Color:  Seat Width:  17 Seat Depth 18   [x]  Manual mobility base (continue below)   []  Scooter/POV  []  Power mobility base   Number of hours per day spent in above selected mobility base: 12+  Typical daily mobility base use Schedule: all means of home mobility   [x]  is not a safe, functional ambulator  [x]  limitation prevents from completing a MRADL(s) within a reasonable time frame    [x]  limitation places at high risk of morbidity or mortality secondary to  the attempts to perform a    MRADL(s)  [x]  limitation prevents accomplishing a MRADL(s) entirely  [x]  provide independent mobility  [x]  equipment is a lifetime medical need  [x]  walker or cane inadequate  [x]  any type manual wheelchair      inadequate  [x]  scooter/POV inadequate      []  requires dependent mobility          MANUAL MOBILITY      []  Standard manual wheelchair  K0001      Arm:    []  both []  right  []  left      Foot:   []  both []  right   []  left  []  self-propels wheelchair  []  will use on regular basis  []  chair fits throughout home  []  willing and  motivated to use  []  propels with assistance     []  dependent use   []  Standard hemi-manual wheelchair  K0002      Arm:    []  both []  right  []  left      Foot:   []  both []  right   []  left  []  lower seat height required to foot propel  []  short stature  []  self-propels wheelchair  []  will use on regular basis  []  chair fits throughout home  []  willing and motivated to use   []  propels with assistance  []  dependent use   []  Lightweight manual wheelchair  K0003      Arm:    []  both []  right  []  left      Foot:   []  both  []  right  []  left                   []  hemi height required  []  medical condition and weight of  wheelchair affect ability to self      propel standard manual wheelchair in the residence  []  can and does self-propel (marginal propulsion skills)  []  daily use _________hours  []  chair fits throughout home  []  willing and motivated to use  []  lower seat height required to foot propel  []  short stature   []  High strength lightweight manual  wheelchair (Breezy Ultra 4)  K0004     Arm:    []  both []  right  []  left     Foot:   []  both []  right   []  left                                                                  []   hemi height required []  medical condition and weight of wheelchair affect ability to self propel while engaging in frequent MRADL(s) that cannot be performed in a standard or lightweight manual wheelchair  []  daily use _________hours  []  chair fits throughout home  []  willing and motivated to use  []  prevent repetitive use injuries   []  lower seat height required to foot propel  []  short stature    [x]  Ultra-lightweight manual wheelchair  K0005     Arm:    [x]  both []  right  []  left     Foot:   [x]  both []  right  []  left       [x]  hemi height required  []  heavy duty    Front seat to floor ___16.5__ inches      Rear seat to floor ___14.5__ inches      Back height ___20__ inches     Back angle ____100__ degrees      Front angle _____ degrees   [x]   full-time manual wheelchair user  [x]  Requires individualized fitting and optimal adjustments for multiple features that include adjustable axle configuration, fully adjustable center of gravity, wheel camber, seat and back angle, angle of seat slope, which cannot be accommodated by a K0001 through K0004 manual wheelchair  [x]  prevent repetitive use injuries  [x]  daily use___12+______hours   [x]  user has high activity patterns that frequently require  them  to go out into the community for the purpose of independently accomplishing high level MRADL activities. Examples of these might include a combination of; shopping, work, school, photographer, childcare, independently loading and unloading from a vehicle etc.  [x]  lower seat height required to foot propel  []  short stature  []  heavy duty -  weight over 250lbs   []  Current chair is a K0005   manufacture:___________________  model:_________________  serial#____________________  age:_________    []  First time X9994 user (complete trial)  K0004 time and # of strokes to propel 30 feet: ________seconds _________strokes  X9994 time and # of strokes to propel 30 feet: ________seconds _________strokes  What was the result of the trial between the K0004 and K0005 manual wheelchair? ___    What features of the K0005 w/c are needed as compared to the K0004 base? Why?___    []  adjustable seat and back angle changes the angle of seat slope of the frame to attain a gravity assisted position for efficient propulsion and proper weight distribution along the frame     []  the front of the wheelchair will be configured higher than the back of the chair to allow gravity to assist the user with postural stability  []  the center of the wheel will be positioned for stability, safety and efficient propulsion  []  adjustable axle allows for vertical, horizontal, camber and overall width changes  throughout the wheels for adjustment of the client's exact needs  and abilities.   []  adjustable axle increases the stability and function of the chair allowing for adjustment of the center of gravity.   []  accommodates the client's anatomical position in the chair maximizing independence in mobility and maneuverability in all environments.   []  create a minimal fixed tilt-in space to assist in positioning.   []  Describe users full-time manual wheelchair activity patterns:___    []  Power assist Comments:  []  prevent repetitive use injuries  []  repetitive strain injury present in    shoulder girdle    []  shoulder pain is (> or =) to 7/10     during  manual propulsion       Current Pain _____/10  []  requires conservation of energy to participate in MRADL(s) runable to propel up ramps or curbs using manual wheelchair  []  been K0005 user greater than one year  []  user unwilling to use power      wheelchair (reason): []  less expensive option to power   wheelchair   []  rim activated power assist -      decreased strength   []  Heavy duty manual wheelchair       K0006     Arm:    []  both []  right  []  left     Foot:   []  both []  right  []  left     []  hemi height required    []  Dependent base  []  user exceeds 250lbs  []  non-functional ambulator    []  extreme spasticity  []  over active movement   []  broken frame/hx of repeated     repairs  []  able to self-propel in residence       []  lower seat to floor height required  []  unable to self-propel in residence   []  Extra heavy duty manual wheelchair  K0007     Arm:    []  both []  right  []  left     Foot:   []  both []  right  []  left     []  hemi height required  []  Dependent base  []  user exceeds 300lbs  []  non-functional ambulator    []  able to self-propel in residence   []  lower seat to floor height required  []  unable to self-propel in residence     []  Manual wheelchair with tilt [phone]      (Manual Tilt-n-Space)  []  patient is dependent for transfers  []  patient requires frequent       positioning for  pressure relief   []  patient requires frequent      positioning for poor/absent trunk control        []  Stroller Base  []  infant/child   []  unable to propel manual      wheelchair  []  allows for growth  []  non-functional ambulator  []  non-functional UE  []  independent mobility is not a goal at this time    MANUAL FRAME OPTIONS      Push handles  []  extended   [x]  angle adjustable   []  standard  [x]  caregiver access  [x]  caregiver assist    []  allows hooking to enable      increased ability to perform ADLs or maintain balance   [x]  Angle Adjustable Back  [x]  postural control  []  control of tone/spasticity  [x]  accommodation of range of motion  [x]  UE functional control  [x]  accommodation for seating system    Rear wheel placement  []  std/fixed  [x] fully adjustableramputee   []  camber ________degree  [x]  removable rear wheel  []  non-removable rear wheel  Wheel size __24_____  Wheel style__________spoke_____________  [x]  improved UE access to wheels  [x]  increase propulsion ability  [x]  improved stability  [x]  changing angle in space for      improvement of postural stability  [x]  remove for transport    [x]  allow for seating system to fit on  base  []  amputee placement  []  1-arm drive access   r R  r L  []  enable propulsion of manual       wheelchair with one arm    []  amputee placement   Wheel rims/ Hand rims  [  x] Standard    []  Specialized-____ [x]  provide ability to propel manual   []  increase self-propulsion with hand wheelchair weakness/decreased grasp     []  Spoke protector/guard   []  prevent hands from getting caught in spokes   Tires:  []  pneumatic  [x]  flat free inserts  []  solid  Style:  []  decrease roll resistance              []  prevent frequent flats  []  increase shock absorbency  [x]  decrease maintenance   []  decrease pain from road shock    []  decrease spasms from road shock    Wheel Locks:    [x]  push []  pull []  scissor  [x]  lock wheels for transfers   [x]  lock wheels from rolling   Brake/wheel lock extension:  [x]  R  [x]  L  [x]  allow user to operate wheel locks due to decreased reach or strength   Caster housing:  Caster size:                      Style:                                          []  suspension fork  []  maneuverability   []  stability of wheelchair   []  durability  []  maintenance  []  angle adjustment for posture  []  allow for feet to come under        wheelchair base  []  allows change in seat to floor  height   []  increase shock absorbency  []  decrease pain from road shock  []  decrease spasms from road    shock   []  Side guards  []  prevent clothing getting caught in wheel or becoming soiled   [] provide hip and pelvic stability  []  eliminates contact between body and wheels  []  limit hand contact with wheels   [x]  Anti-tippers      [x]  prevent wheelchair from tipping    backward  [x]  assist caregiver with curbs     POWER MOBILITY      []  Scooter/POV    []  can safely operate   []  can safely transfer   []  has adequate trunk stability   []  cannot functionally propel  manual wheelchair    []  Power mobility base    []  non-ambulatory   []  cannot functionally propel manual wheelchair   []  cannot functionally and safely      operate scooter/POV  []  can safely operate power       wheelchair  []  home is accessible  []  willing to use power wheelchair     Tilt  []  Powered tilt on powered chair  []  Powered tilt on manual chair  []  Manual tilt on manual chair Comments:  []  change position for pressure      []  elief/cannot weight shift   []  change position against      gravitational force on head and      shoulders   []  decrease pain  []  blood pressure management   []  control autonomic dysreflexia  []  decrease respiratory distress  []  management of spasticity  []  management of low tone  []  facilitate postural control   []  rest periods   []  control edema  []  increase sitting tolerance   []  aid with transfers      Recline   []  Power recline on power chair  []   Manual recline on manual chair  Comments:    []  intermittent catheterization  []  manage spasticity  []  accommodate femur to back angle  []  change position for pressure relief/cannot weight shift rhigh risk of pressure sore development  []  tilt alone does not accomplish     effective pressure relief, maximum pressure relief achieved at -      _______ degrees tilt   _______ degrees recline   []  difficult to transfer to and from bed []  rest periods and sleeping in chair  []  repositioning for transfers  []  bring to full recline for ADL care  []  clothing/diaper changes in chair  []  gravity PEG tube feeding  []  head positioning  []  decrease pain  []  blood pressure management   []  control autonomic dysreflexia  []  decrease respiratory distress  []  user on ventilator     Elevator on mobility base  []  Power wheelchair  []  Scooter  []  increase Indep in transfers   []  increase Indep in ADLs    []  bathroom function and safety  []  kitchen/cooking function and safety  []  shopping  []  raise height for communication at standing level  []  raise height for eye contact which reduces cervical neck strain and pain  []  drive at raised height for safety and navigating crowds  []  Other:   []  Vertical position system  (anterior tilt)     (Drive locks-out)    []  Stand       (Drive enabled)  []  independent weight bearing  []  decrease joint contractures  []  decrease/manage spasticity  []  decrease/manage spasms  []  pressure distribution away from   scapula, sacrum, coccyx, and ischial tuberosity  []  increase digestion and elimination   []  access to counters and cabinets  []  increase reach  []  increase interaction with others at eye level, reduces neck strain  []  increase performance of       MRADL(s)      Power elevating legrest    []  Center mount (Single) 85-170 degrees       []  Standard (Pair) 100-170 degrees  []  position legs at 90  degrees, not available with std power ELR  []  center mount tucks into chair to decrease turning radius in home, not available with std power ELR  []  provide change in position for LE  []  elevate legs during recline    []  maintain placement of feet on      footplate  []  decrease edema  []  improve circulation  []  actuator needed to elevate legrest  []  actuator needed to articulate legrest preventing knees from flexing  []  Increase ground clearance over      curbs  []   STD (pair) independently                     elevate legrest   POWER WHEELCHAIR CONTROLS      Controls/input device  []  Expandable  []  Non-expandable  []  Proportional  []  Right Hand []  Left Hand  []  Non-proportional/switches/head-array  []  Electrical/proximity         []   Mechanical      Manufacturer:___________________   Type:________________________ []  provides access for controlling wheelchair  []  programming for accurate control  []  progressive disease/changing condition  []  required for alternative drive      controls       []  lacks motor control to operate  proportional drive control  []  unable to understand proportional controls  []  limited movement/strength  []  extraneous movement / tremors /  ataxic / spastic       []  Upgraded electronics controller/harness    []  Single power (tilt or recline)   []  Expandable    []  Non-expandable plus   []  Multi-power (tilt, recline, power legrest, power seat lift, vertical positioning system, stand)  []  allows input device to communicate with drive motors  []  harness provides necessary connections between the controller, input device, and seat functions     []  needed in order to operate power seat functions through joystick/ input device  []  required for alternative drive controls     []  Enhanced display  []  required to connect all alternative drive controls   []  required for upgraded joystick      (lite-throw, heavy duty, micro)  []  Allows user to see in which mode and  drive the wheelchair is set; necessary for alternate controls       []  Upgraded tracking electronics  []  correct tracking when on uneven surfaces makes switch driving more efficient and less fatiguing  []  increase safety when driving  []  increase ability to traverse thresholds    []  Safety / reset / mode switches     Type:    []  Used to change modes and stop the wheelchair when driving     []  Mount for joystick / input device/switches  []  swing away for access or transfers   []  attaches joystick / input device / switches to wheelchair   []  provides for consistent access  []  midline for optimal placement    []  Attendant controlled joystick plus     mount  []  safety  []  long distance driving  []  operation of seat functions  []  compliance with transportation regulations    []  Battery  []  required to power (power assist / scooter/ power wc / other):   []  Power inverter (24V to 12V)  []  required for ventilator / respiratory equipment / other:     CHAIR OPTIONS MANUAL & POWER      Armrests   [x]  adjustable height []  removable  []  swing away []  fixed  [x]  flip back  []  reclining  []  full length pads [x]  desk []  tube arms []  gel pads  [x]  provide support with elbow at 90    [x]  remove/flip back/swing away for  transfers  [x]  provide support and positioning of upper body    [x]  allow to come closer to table top  [x]  remove for access to tables  []  provide support for w/c tray  [x]  change of height/angles for variable activities   []  Elbow support / Elbow stop  []  keep elbow positioned on arm pad  []  keep arms from falling off arm pad  during tilt and/or recline   Upper Extremity Support  []  Arm trough  []   R  []   L  Style:  []  swivel mount []  fixed mount   []  posterior hand support  []   tray  []  full tray  []  joystick cut out  []   R  []   L  Style:  []  decrease gravitational pull on      shoulders  []  provide support to increase UE  function  []  provide hand support in natural     position  []  position flaccid UE  []  decrease subluxation    []  decrease edema       []  manage spasticity   []  provide midline positioning  []  provide work surface  []  placement for AAC/ Computer/ EADL  Hangers/ Legrests   []  ______ degree  []  Elevating []  articulating  []  swing away []  fixed []  lift off  []  heavy duty  []  adjustable knee angle  []  adjustable calf panel   []  longer extension tube              []  provide LE support  []  maintain placement of feet on      footplate   []  accommodate lower leg length  []  accommodate to hamstring       tightness  []  enable transfers  []  provide change in position for LE's  []  elevate legs during recline    []  decrease edema  []  durability      Foot support   []  footplate []  R []  L []  flip up           []  Depth adjustable   []  angle adjustable  []  foot board/one piece    []  provide foot support  []  accommodate to ankle ROM  []  allow foot to go under wheelchair base  []  enable transfers     []  Shoe holders  []  position foot    []  decrease / manage spasticity  []  control position of LE  []  stability    []  safety     [x]  Ankle strap/heel      loops  [x]  support foot on foot support  []  decrease extraneous movement  []  provide input to heel   []  protect foot     []  Amputee adapter []  R  []  L     Style:                  Size:  []  Provide support for stump/residual extremity    []  Transportation tie-down  []  to provide crash tested tie-down brackets    []  Crutch/cane holder    []  O2 holder    []  IV hanger   []  Ventilator tray/mount    []  stabilize accessory on wheelchair       Component  Justification     [x]  Seat cushion      []  accommodate impaired sensation  []  decubitus ulcers present or history  [x]  unable to shift weight  [x]  increase pressure distribution  [x]  prevent pelvic extension  []  custom required off-the-shelf    seat cushion will not accommodate deformity  [x]  stabilize/promote pelvis alignment   [x]  stabilize/promote femur alignment  []  accommodate obliquity  []  accommodate multiple deformity  [x]  incontinent/accidents  []  low maintenance     []  seat mounts                 []  fixed []  removable  []  attach seat platform/cushion to wheelchair frame    []  Seat wedge    []  provide increased aggressiveness of seat shape to decrease sliding  down in the seat  []  accommodate ROM        []  Cover replacement   []  protect back or seat cushion  []  incontinent/accidents    []  Solid seat / insert    []  support cushion to prevent      hammocking  []  allows attachment of cushion to mobility base    []  Lateral pelvic/thigh/hip     support (Guides)     []  decrease abduction  []  accommodate pelvis  []  position upper legs  []  accommodate spasticity  []  removable for transfers     []  Lateral pelvic/thigh      supports mounts  []  fixed   []   swing-away   []  removable  []  mounts lateral pelvic/thigh supports     []  mounts lateral pelvic/thigh supports swing-away or removable for transfers    []  Medial thigh support ([place]mel)  [] decrease adduction  [] accommodate ROM  []  remove for transfers   []  alignment      []  Medial thigh   []  fixed      support mounts      []  swing-away   []  removable  []  mounts medial thigh supports   []  Mounts medial supports swing- away or removable for transfers       Component  Justification   [x]  Back       [x]  provide posterior trunk support []  facilitate tone  [x]  provide lumbar/sacral support []  accommodate deformity  []  support trunk in midline   []  custom required off-the-shelf back support will not accommodate deformity   []  provide lateral trunk support []  accommodate or decrease tone            []  Back mounts  []  fixed  []  removable  []  attach back rest/cushion to wheelchair frame   []  Lateral trunk      supports  []  R []  L  []  decrease lateral trunk leaning  []  accommodate asymmetry    []  contour for increased contact  []  safety    []  control of  tone    []  Lateral trunk      supports mounts  []  fixed  []  swing-away   []  removable  []  mounts lateral trunk supports     []  Mounts lateral trunk supports swing-away or removable for transfers   []  Anterior chest      strap, vest     []  decrease forward movement of shoulder  []  decrease forward movement of trunk  []  safety/stability  []  added abdominal support  []  trunk alignment  []  assistance with shoulder control   []  decrease shoulder elevation    []  Headrest      []  provide posterior head support  []  provide posterior neck support  []  provide lateral head support  []  provide anterior head support  []  support during tilt and recline  []  improve feeding     []  improve respiration  []  placement of switches  []  safety    []  accommodate ROM   []  accommodate tone  []  improve visual orientation   []  Headrest           []  fixed []  removable []  flip down      Mounting hardware   []  swing-away laterals/switches  []  mount headrest   []  mounts headrest flip down or  removable for transfers  []  mount headrest swing-away laterals   []  mount switches     []  Neck Support    []  decrease neck rotation  []  decrease forward neck flexion   Pelvic Positioner    []  std hip belt          [x]  padded hip belt  []  dual pull hip belt  []  four point hip belt  []  stabilize tone  [x]  decrease falling out of chair  []  prevent excessive extension  []  special pull angle to control      rotation  []  pad for protection over boney   prominence  []  promote comfort    []  Essential needs        bag/pouch   []  medicines []  special food rorthotics []  clothing changes  []  diapers  []  catheter/hygiene []  ostomy supplies  The above equipment has a life- long use expectancy.  Growth and changes in medical and/or functional conditions would be the exceptions.   SUMMARY:  Why mobility device was selected; include why a lower level device is not appropriate: Pt no longer functional ambulator, hx of multiple  falls, needs full time ultra light weight mobility in order and adjustments that come with it to have appropriate positioning in sitting.  ASSESSMENT:  CLINICAL IMPRESSION: Patient is a 73 y.o. male who was seen today for physical therapy evaluation and treatment for wheelchair assessment due to history of Parkinson's disease and history of falls.  He would benefit from manual lightweight wheelchair to allow for optimal, safe mobility in the home.    OBJECTIVE IMPAIRMENTS Abnormal gait, decreased balance, decreased mobility, difficulty walking, decreased ROM, decreased strength, decreased safety awareness, impaired flexibility, impaired tone, postural dysfunction, and pain.   ACTIVITY LIMITATIONS sitting, standing, transfers, bed mobility, bathing, toileting, dressing, hygiene/grooming, locomotion level, and caring for others  PARTICIPATION LIMITATIONS: meal prep, cleaning, laundry, shopping, community activity, and yard work  PERSONAL FACTORS 3+ comorbidities: see above are also affecting patient's functional outcome.   REHAB POTENTIAL: Good  CLINICAL DECISION MAKING: Evolving/moderate complexity  EVALUATION COMPLEXITY: Moderate                                   GOALS: One time visit. No goals established.    PLAN: PT FREQUENCY: one time visit    Reine Bristow W., PT 02/16/2024, 9:53 AM     Physical Therapist name printed:         Physical Therapist signature:      Date:

## 2024-02-18 ENCOUNTER — Ambulatory Visit: Admitting: Physical Therapy

## 2024-02-22 ENCOUNTER — Ambulatory Visit: Admitting: Physical Therapy

## 2024-02-22 ENCOUNTER — Encounter: Payer: Self-pay | Admitting: Physical Therapy

## 2024-02-22 DIAGNOSIS — R293 Abnormal posture: Secondary | ICD-10-CM

## 2024-02-22 DIAGNOSIS — R2681 Unsteadiness on feet: Secondary | ICD-10-CM

## 2024-02-22 DIAGNOSIS — M6281 Muscle weakness (generalized): Secondary | ICD-10-CM

## 2024-02-22 DIAGNOSIS — R2689 Other abnormalities of gait and mobility: Secondary | ICD-10-CM

## 2024-02-22 NOTE — Therapy (Signed)
 " OUTPATIENT PHYSICAL THERAPY TREATMENT NOTE/RECERT/PROGRESS NOTE   Patient Name: Cody Dickerson MRN: [id number] DOB:[birth year], 73 y.o., male Today's Date: 02/22/2024   PCP: Okey Carlin Redbird, MD REFERRING PROVIDER: Evonnie Asberry RAMAN, DO   Progress Note Reporting Period 12/22/2023 to 02/22/2024  See note below for Objective Data and Assessment of Progress/Goals.       END OF SESSION:  PT End of Session - 02/22/24 0934     Visit Number 24    Number of Visits 27    Date for Recertification  03/04/24    Authorization Type Medicare/[employer]!!!!!!!    Progress Note Due on Visit --   completed at 24   PT Start Time 0935    PT Stop Time 1013    PT Time Calculation (min) 38 min    Equipment Utilized During Treatment Gait belt    Activity Tolerance Patient tolerated treatment well    Behavior During Therapy WFL for tasks assessed/performed;Impulsive                         Past Medical History:  Diagnosis Date   Balance problem    Gout    Hypercholesterolemia    Hypertension    Parkinson's disease (HCC)    Small bowel obstruction (HCC)    Past Surgical History:  Procedure Laterality Date   BOWEL BLOCKAGE  05/2019   IR ANGIO INTRA EXTRACRAN SEL COM CAROTID INNOMINATE BILAT MOD SED  10/03/2021   IR ANGIO VERTEBRAL SEL VERTEBRAL UNI R MOD SED  10/03/2021   IR RADIOLOGIST EVAL & MGMT  08/19/2021   IR US  GUIDE VASC ACCESS RIGHT  10/04/2021   Patient Active Problem List   Diagnosis Date Noted   Stenosis of right carotid artery 08/14/2023   Orthostatic hypotension 08/14/2023   Supine hypertension 08/14/2023   Hypercholesteremia 08/14/2023   Pain due to onychomycosis of toenails of both feet 09/03/2022   Porokeratosis 06/04/2022   Parkinson's disease (HCC) 02/06/2021   SBO (small bowel obstruction) (HCC) 07/06/2019    ONSET DATE: 3-4 years  REFERRING DIAG: R53.1 (ICD-10-CM) - Strength loss of R26.9 (ICD-10-CM) - Gait difficulty R26.89 (ICD-10-CM) - Balance  problem Z74.09 (ICD-10-CM) - Impaired functional mobility, balance, and endurance  THERAPY DIAG:  Unsteadiness on feet  Other abnormalities of gait and mobility  Muscle weakness (generalized)  Abnormal posture  Rationale for Evaluation and Treatment: Rehabilitation  SUBJECTIVE:  SUBJECTIVE STATEMENT: No falls, no pain.  Using the recliner some during the day.  Back to sleeping in the bed. *Notes 8 falls in the past 2 months, all from the w/c.  No walking or transfer falls.   Pt accompanied by: Caregiver- in waiting room   PERTINENT HISTORY: Parkinson's, Left distal supraclinoid ICA aneurysm, mild cognitive impairment Has been doing HHPT 1x/wk  PAIN:  Are you having pain? Yes: NPRS scale: 4/10 Pain location: Base of neck Pain description: sore Aggravating factors: holding head down Relieving factors: Advil   PRECAUTIONS: None  RED FLAGS: None   WEIGHT BEARING RESTRICTIONS: No  FALLS: Has patient fallen in last 6 months? Yes. Number of falls last fell out of chair ~1.5 weeks ago; 2-3 falls   LIVING ENVIRONMENT: Lives with: lives with their family and Has a caregiver present 8a-12p daily; Wife is otherwise present Lives in: House/apartment Stairs: 3 steps to enter/exit Has following equipment at home: Vannie - 2 wheeled and Wheelchair (manual)  PLOF: Needs assistance with ADLs and Needs assistance with gait  PATIENT GOALS: Improve neck posture and balance  OBJECTIVE:    TODAY'S TREATMENT: 02/22/2024 Activity Comments  W/C to chair transfer-SPT Min guard, shuffling steps  TUG:  21.85 sec with RW 20.46 sec with RW 19.84 sec with RW   FTSTS:  22.56 sec with BUE support 21.06 sec with BUE support   Berg Balance 39/56 Improved from 38/56  Short distance forward gait and turns with  RW, 3 reps   Gait forward/backward, 3 M back 10.79 sec with max assist and hastening With RW and assist to prevent from continuing posterior LOB        [facility] PT Assessment - 02/22/24 0001       Berg Balance Test   Sit to Stand Able to stand  independently using hands    Standing Unsupported Able to stand safely 2 minutes    Sitting with Back Unsupported but Feet Supported on Floor or Stool Able to sit safely and securely 2 minutes    Stand to Sit Controls descent by using hands    Transfers Able to transfer safely, definite need of hands    Standing Unsupported with Eyes Closed Able to stand 10 seconds with supervision    Standing Unsupported with Feet Together Able to place feet together independently and stand 1 minute safely    From Standing, Reach Forward with Outstretched Arm Can reach confidently >25 cm (10)    From Standing Position, Pick up Object from Floor Able to pick up shoe, needs supervision    From Standing Position, Turn to Look Behind Over each Shoulder Turn sideways only but maintains balance    Turn 360 Degrees Needs assistance while turning    Standing Unsupported, Alternately Place Feet on Step/Stool Able to complete 4 steps without aid or supervision    Standing Unsupported, One Foot in Front Able to plae foot ahead of the other independently and hold 30 seconds    Standing on One Leg Tries to lift leg/unable to hold 3 seconds but remains standing independently    Total Score 39           PATIENT EDUCATION: Education details: Progress/goal check/objective measures; ways to upgrade HEP in next several sessions, POC-likely plan for discharge after next week Person educated: Patient Education method: Explanation, Demonstration, Tactile cues, and Verbal cues Education comprehension: verbalized understanding and returned demonstration   HOME EXERCISE PROGRAM: Access Code: [email or web address] URL: [email or web address] Date: 02/09/2024 Prepared by: [facility] -  Outpatient  Rehab - [facility]  Exercises - Seated Thoracic Lumbar Extension with Pectoralis Stretch  - 3 x daily - 7 x weekly - 2-3 sets - 10 reps - Seated Cervical Retraction Extension Passive Loaded  - 3 x daily - 7 x weekly - 2-3 sets - 10 reps - Seated Cervical Retraction  - 1 x daily - 7 x weekly - 2 sets - 5 reps - 5 sec hold - Seated Diagonal Chops with Medicine Ball  - 1 x daily - 7 x weekly - 2 sets - 10 reps - Supine Isometric Neck Extension  - 1 x daily - 7 x weekly - 3 sets - 10 reps - 2 sec hold - Sit to Stand with Armchair  - 1 x daily - 7 x weekly - 3 sets - 5 reps - Seated Anterior Pelvic Tilt  - 1 x daily - 7 x weekly - 3 sets - 5 reps     Note: Objective measures were completed at Evaluation unless otherwise noted.  DIAGNOSTIC FINDINGS: n/a  COGNITION: Overall cognitive status: History of cognitive impairments - at baseline  COORDINATION: Tremors present throughout  POSTURE: rounded shoulders, forward head, and increased thoracic kyphosis  CERVICAL ROM: At rest flexed ~40 deg  Active ROM A/PROM (deg) eval  Flexion Can get chin to chest  Extension -40 to -20 deg (unable to go into full extension)  Right lateral flexion 40  Left lateral flexion 20  Right rotation 35  Left rotation 40   (Blank rows = not tested)   LOWER EXTREMITY ROM:   WNL  LOWER EXTREMITY MMT:    MMT Right Eval Left Eval  Hip flexion 5 5  Hip extension 4 4  Hip abduction 3+ 3+  Hip adduction    Hip internal rotation    Hip external rotation    Knee flexion 4- 3+  Knee extension 5 5  Ankle dorsiflexion    Ankle plantarflexion    Ankle inversion    Ankle eversion    (Blank rows = not tested)  BED MOBILITY:  Findings: Sit to supine SBA Supine to sit SBA Multiple small scooting motions  TRANSFERS: Sit to stand: Min A  Assistive device utilized: [employer]ant - 2 wheeled     Stand to sit: CGA and Min A  Assistive device utilized: Environmental Consultant - 2 wheeled     Min A for  stability -- will at times require multiple attempts to come to standing  GAIT: Findings: Gait Characteristics: Festination at times, step through pattern, decreased step length- Right, decreased step length- Left, decreased stance time- Right, decreased stance time- Left, shuffling, trunk flexed, poor foot clearance- Right, and poor foot clearance- Left, Distance walked: ~10', and Comments: RW  FUNCTIONAL TESTS:  5 times sit to stand: 25.84 sec -- uses UEs to push off mat table, legs braced against table to stand, mild tremors/unsteadiness, requires a few attempts to obtain momentum to stand Timed up and go (TUG): 23.06 sec -- shuffling with turns, diminished step length, required 2 attempts to use momentum to stand  PATIENT SURVEYS:  PSFS: THE PATIENT SPECIFIC FUNCTIONAL SCALE  Place score of 0-10 (0 = unable to perform activity and 10 = able to perform activity at the same level as before injury or problem)  Activity Date: 11/05/23    Holding head up 5    2.  Standing balance  5    3.       4.  Total Score 5      Total Score = Sum of activity scores/number of activities  Minimally Detectable Change: 3 points (for single activity); 2 points (for average score)  [facility] (nd). The Patient Specific Functional Scale . Retrieved from [email or web address]                                                                                                                                  GOALS: Goals reviewed with patient? Yes  SHORT TERM GOALS: Target date: 02/02/2024     Pt will be able to perform HEP with caregiver min A Baseline: Goal status: MET 12/01/23  2.  Pt will be able to maintain neck extension to at least within 10 deg of neutral Baseline: pt is 40 to 20 deg away from neutral; 17 degrees flexion. Able to get to actively get to neutral neck posture 9.30.25 Goal status: NOT MET 12/01/23  3.  Demo  modified independent stand-pivot transfers from w/c to various surfaces to improve safety with maneuvering in small spaces  Baseline: CGA-SBA-continues to need CGA for safety 02/02/2024  Goal status: IN PROGRESS   LONG TERM GOALS: Target date: 02/23/2024>03/04/2024     Pt will be able to perform head/neck and postural strengthening with SBA Baseline: pt and caregiver have reported understanding; updates provided mid December Goal status: IN PROGRESS 02/22/2024  2.  Pt will be able to maintain neutral neck for at least 10 min for ADLs Baseline: Unable; unchanged  Goal status:NOT MET 02/22/2024  3.  Pt will report improved PSFS by >/=7 to demo MCID Baseline: 5; 7 12/22/23 Goal status: MET 12/22/23  4.  Pt will have improved 5x STS to </=20 sec to demo increased functional LE strength Baseline: 25.84 sec; 19.17 sec with UE; 16 sec w/ BUE; 21.06 sec 02/22/2024 Goal status: NOT MET 02/22/2024  5.  Pt will have improved TUG to </=15 sec to decrease fall risk Baseline: 23.06 sec; 23.93; 17 sec; 19.84 sec with RW 02/22/2024 Goal status: NOT MET, 02/22/2024    6.  Improve Berg Balance Test score to 45/56 to reduce risk for falls and improve safety    Baseline: 38/56; 39/56 02/22/2024    Goal status: NOT MET 02/22/2024   7.  Caregiver to teachback freezing of gait strategies to maintain household ambulation    Baseline: cues have been provided to patient   Goal status: IN PROGRESS 02/22/2024   8. 3 M walk test backwards to be performed in 10 sec or less with no retropulsion or hastening, min guard.   Baseline:  10.79 sec hastening and max assist to prevent retropulsion fall   Goal status:  INITIAL 02/22/2024   ASSESSMENT:  CLINICAL IMPRESSION: Pt presents today with no new complaints.  He was seen in clinic last week for wheelchair assessment with [employer], with recommendations made for appropriate manual wheelchair to accommodate posture, and improve safety, given pt's fall  risk and  hx of falls (pt reports 8 falls from current manual w/c at home).  Assessed objective measures, with pt continueing to be at increased fall risk per FTSTS, TUG, Berg balance scores.  FTSTS score 21.06 sec has actually increased from last check, as well as TUG score of 19.84 sec.  Berg score improved by 1 point to 39/56.  Assessed 3 M backwards walk, with pt taking 10.79 sec with hastening of gait and retropulsion, such that pt provided max assist to prevent fall.  Given pt's minimal progression of improvement with balance and continueing to have posture issues with decreased ability to hold neck against gravity, PT recommends 2-3 additional sessions to fully progress/solidify current HEP with caregiver education on HEP as well as ways to prevent falls in posterior direction.  OBJECTIVE IMPAIRMENTS: Abnormal gait, decreased activity tolerance, decreased balance, decreased endurance, decreased mobility, difficulty walking, decreased ROM, decreased strength, decreased safety awareness, hypomobility, increased fascial restrictions, increased muscle spasms, impaired flexibility, impaired UE functional use, improper body mechanics, postural dysfunction, and pain.   ACTIVITY LIMITATIONS: carrying, lifting, bending, sitting, standing, squatting, stairs, transfers, bed mobility, bathing, toileting, dressing, reach over head, hygiene/grooming, and locomotion level  PARTICIPATION LIMITATIONS: meal prep, cleaning, laundry, medication management, driving, shopping, community activity, and yard work  PERSONAL FACTORS: Age, Fitness, Past/current experiences, and Time since onset of injury/illness/exacerbation are also affecting patient's functional outcome.   REHAB POTENTIAL: Good  CLINICAL DECISION MAKING: Evolving/moderate complexity  EVALUATION COMPLEXITY: Moderate  PLAN:  PT FREQUENCY: 2x/week  PT DURATION: 6 weeks  PLANNED INTERVENTIONS: 97164- PT Re-evaluation, 97110-Therapeutic exercises, 97530-  Therapeutic activity, 97112- Neuromuscular re-education, 97535- Self Care, [id number]- Manual therapy, [phone]- Gait training, [phone]- Aquatic Therapy, [phone]- Electrical stimulation (unattended), [phone] (1-2 muscles), [phone] (3+ muscles)- Dry Needling, Patient/Family education, Balance training, Stair training, Taping, Joint mobilization, Spinal mobilization, Cryotherapy, and Moist heat  PLAN FOR NEXT SESSION: Recert completed today to cover remaining schedule appts through 03/04/2024.  *Please make sure that HEP includes postural/neck strengthening, seated PWR! Moves (review, as pt has had these from a while ago), as well as adding to HEP for forward/back stepping, side step and BACKWARDS WALKING (step, step/STOP and move walker, repeat) to prevent hastening and retropulsion.  PLAN FOR discharge at 03/04/2024 visit.   Greig Anon, PT 02/22/2024 11:22 AM Phone: [phone] Fax: [phone]  [facility] at [facility] [address] [place], [address] [place], [place] [address] Phone # [phone] Fax # [phone]     "

## 2024-02-23 NOTE — Therapy (Signed)
 " OUTPATIENT PHYSICAL THERAPY TREATMENT NOTE   Patient Name: Cody Dickerson MRN: [id number] DOB:1950-09-08, 73 y.o., male Today's Date: 02/24/2024   PCP: Okey Carlin Redbird, MD REFERRING PROVIDER: Tat, Asberry RAMAN, DO         END OF SESSION:  PT End of Session - 02/24/24 1013     Visit Number 25    Number of Visits 27    Date for Recertification  03/04/24    Authorization Type Medicare/[employer]!!!!!!!    Progress Note Due on Visit --   completed at 24   PT Start Time 0932    PT Stop Time 1013    PT Time Calculation (min) 41 min    Equipment Utilized During Treatment Gait belt    Activity Tolerance Patient tolerated treatment well    Behavior During Therapy [facility] for tasks assessed/performed;Impulsive                          Past Medical History:  Diagnosis Date   Balance problem    Gout    Hypercholesterolemia    Hypertension    Parkinson's disease (HCC)    Small bowel obstruction (HCC)    Past Surgical History:  Procedure Laterality Date   BOWEL BLOCKAGE  05/2019   IR ANGIO INTRA EXTRACRAN SEL COM CAROTID INNOMINATE BILAT MOD SED  10/03/2021   IR ANGIO VERTEBRAL SEL VERTEBRAL UNI R MOD SED  10/03/2021   IR RADIOLOGIST EVAL & MGMT  08/19/2021   IR US  GUIDE VASC ACCESS RIGHT  10/04/2021   Patient Active Problem List   Diagnosis Date Noted   Stenosis of right carotid artery 08/14/2023   Orthostatic hypotension 08/14/2023   Supine hypertension 08/14/2023   Hypercholesteremia 08/14/2023   Pain due to onychomycosis of toenails of both feet 09/03/2022   Porokeratosis 06/04/2022   Parkinson's disease (HCC) 02/06/2021   SBO (small bowel obstruction) (HCC) 07/06/2019    ONSET DATE: 3-4 years  REFERRING DIAG: R53.1 (ICD-10-CM) - Strength loss of R26.9 (ICD-10-CM) - Gait difficulty R26.89 (ICD-10-CM) - Balance problem Z74.09 (ICD-10-CM) - Impaired functional mobility, balance, and endurance  THERAPY DIAG:  Unsteadiness on feet  Other abnormalities of  gait and mobility  Muscle weakness (generalized)  Abnormal posture  Gait difficulty  Rationale for Evaluation and Treatment: Rehabilitation  SUBJECTIVE:                                                                                                                                                                                             SUBJECTIVE STATEMENT: No falls in the past 1-2 weeks.  Pt accompanied by: Caregiver- Mary   PERTINENT HISTORY: Parkinson's, Left distal supraclinoid ICA aneurysm, mild cognitive impairment Has been doing HHPT 1x/wk  PAIN:  Are you having pain? Yes: NPRS scale: 4/10 Pain location: Base of neck Pain description: sore Aggravating factors: holding head down Relieving factors: Advil   PRECAUTIONS: None  RED FLAGS: None   WEIGHT BEARING RESTRICTIONS: No  FALLS: Has patient fallen in last 6 months? Yes. Number of falls last fell out of chair ~1.5 weeks ago; 2-3 falls   LIVING ENVIRONMENT: Lives with: lives with their family and Has a caregiver present 8a-12p daily; Wife is otherwise present Lives in: House/apartment Stairs: 3 steps to enter/exit Has following equipment at home: Vannie - 2 wheeled and Wheelchair (manual)  PLOF: Needs assistance with ADLs and Needs assistance with gait  PATIENT GOALS: Improve neck posture and balance  OBJECTIVE:     TODAY'S TREATMENT: 02/24/24 Activity Comments  Sitting PWR moves: Up [employer] and consistent verbal cueing to ensure follow through and carryover  Alt fwd steps In walker and CGA  Alt backwards steps At counter and CGA  sidestepping At counter and CGA; cues to slow down and as few steps as possible  Backwards walking Near counter with RW and CGA. (step, step/STOP and move walker, repeat)     PATIENT EDUCATION: Education details: emphasized plan for DC on 03/04/24, HEP update with edu for safety, provided sitting PWR moves videos  Person educated: Patient and  Designer[employer] Education method: Explanation, Demonstration, Tactile cues, Verbal cues, and Handouts Education comprehension: verbalized understanding and returned demonstration     HOME EXERCISE PROGRAM: Access Code: [email or web address] URL: [email or web address] Date: 02/24/2024 Prepared by: [facility] - [facility]  Program Notes perform all standing exercises with caregiver/family assistance  Exercises - Seated Thoracic Lumbar Extension with Pectoralis Stretch  - 3 x daily - 7 x weekly - 2-3 sets - 10 reps - Seated Cervical Retraction Extension Passive Loaded  - 3 x daily - 7 x weekly - 2-3 sets - 10 reps - Seated Cervical Retraction  - 1 x daily - 7 x weekly - 2 sets - 5 reps - 5 sec hold - Seated Diagonal Chops with Medicine Ball  - 1 x daily - 7 x weekly - 2 sets - 10 reps - Supine Isometric Neck Extension  - 1 x daily - 7 x weekly - 3 sets - 10 reps - 2 sec hold - Sit to Stand with Armchair  - 1 x daily - 7 x weekly - 3 sets - 5 reps - Seated Anterior Pelvic Tilt  - 1 x daily - 7 x weekly - 3 sets - 5 reps - Alternating Step Forward with Support  - 1 x daily - 3 x weekly - 2 sets - 10 reps - Alternating Step Backward with Support  - 1 x daily - 3 x weekly - 2 sets - 10 reps - Side Stepping with Counter Support  - 1 x daily - 3 x weekly - 2 sets - 10 reps - Backward Walking with Counter Support  - 1 x daily - 3 x weekly - 2 sets - 10 reps   + sitting PWR moves 2x10 reps daily    Note: Objective measures were completed at Evaluation unless otherwise noted.  DIAGNOSTIC FINDINGS: n/a  COGNITION: Overall cognitive status: History of cognitive impairments - at baseline  COORDINATION: Tremors present throughout  POSTURE: rounded shoulders, forward head,  and increased thoracic kyphosis  CERVICAL ROM: At rest flexed ~40 deg  Active ROM A/PROM (deg) eval  Flexion Can get chin to chest  Extension -40 to -20 deg (unable to go into full  extension)  Right lateral flexion 40  Left lateral flexion 20  Right rotation 35  Left rotation 40   (Blank rows = not tested)   LOWER EXTREMITY ROM:   WNL  LOWER EXTREMITY MMT:    MMT Right Eval Left Eval  Hip flexion 5 5  Hip extension 4 4  Hip abduction 3+ 3+  Hip adduction    Hip internal rotation    Hip external rotation    Knee flexion 4- 3+  Knee extension 5 5  Ankle dorsiflexion    Ankle plantarflexion    Ankle inversion    Ankle eversion    (Blank rows = not tested)  BED MOBILITY:  Findings: Sit to supine SBA Supine to sit SBA Multiple small scooting motions  TRANSFERS: Sit to stand: Min A  Assistive device utilized: [employer] - 2 wheeled     Stand to sit: CGA and Min A  Assistive device utilized: [employer]ant - 2 wheeled     Min A for stability -- will at times require multiple attempts to come to standing  GAIT: Findings: Gait Characteristics: Festination at times, step through pattern, decreased step length- Right, decreased step length- Left, decreased stance time- Right, decreased stance time- Left, shuffling, trunk flexed, poor foot clearance- Right, and poor foot clearance- Left, Distance walked: ~10', and Comments: RW  FUNCTIONAL TESTS:  5 times sit to stand: 25.84 sec -- uses UEs to push off mat table, legs braced against table to stand, mild tremors/unsteadiness, requires a few attempts to obtain momentum to stand Timed up and go (TUG): 23.06 sec -- shuffling with turns, diminished step length, required 2 attempts to use momentum to stand  PATIENT SURVEYS:  PSFS: THE PATIENT SPECIFIC FUNCTIONAL SCALE  Place score of 0-10 (0 = unable to perform activity and 10 = able to perform activity at the same level as before injury or problem)  Activity Date: 11/05/23    Holding head up 5    2.  Standing balance  5    3.       4.      Total Score 5      Total Score = Sum of activity scores/number of activities  Minimally Detectable Change: 3 points (for single  activity); 2 points (for average score)  [facility] (nd). The Patient Specific Functional Scale . Retrieved from [email or web address]                                                                                                                                  GOALS: Goals reviewed with patient? Yes  SHORT TERM GOALS: Target date: 02/02/2024     Pt will be able to perform HEP with caregiver min  A Baseline: Goal status: MET 12/01/23  2.  Pt will be able to maintain neck extension to at least within 10 deg of neutral Baseline: pt is 40 to 20 deg away from neutral; 17 degrees flexion. Able to get to actively get to neutral neck posture 9.30.25 Goal status: NOT MET 12/01/23  3.  Demo modified independent stand-pivot transfers from w/c to various surfaces to improve safety with maneuvering in small spaces  Baseline: CGA-SBA-continues to need CGA for safety 02/02/2024  Goal status: IN PROGRESS   LONG TERM GOALS: Target date: 02/23/2024>03/04/2024     Pt will be able to perform head/neck and postural strengthening with SBA Baseline: pt and caregiver have reported understanding; updates provided mid December Goal status: IN PROGRESS 02/22/2024  2.  Pt will be able to maintain neutral neck for at least 10 min for ADLs Baseline: Unable; unchanged  Goal status:NOT MET 02/22/2024  3.  Pt will report improved PSFS by >/=7 to demo MCID Baseline: 5; 7 12/22/23 Goal status: MET 12/22/23  4.  Pt will have improved 5x STS to </=20 sec to demo increased functional LE strength Baseline: 25.84 sec; 19.17 sec with UE; 16 sec w/ BUE; 21.06 sec 02/22/2024 Goal status: NOT MET 02/22/2024  5.  Pt will have improved TUG to </=15 sec to decrease fall risk Baseline: 23.06 sec; 23.93; 17 sec; 19.84 sec with RW 02/22/2024 Goal status: NOT MET, 02/22/2024    6.  Improve Berg Balance Test score to 45/56 to reduce risk for falls and  improve safety    Baseline: 38/56; 39/56 02/22/2024    Goal status: NOT MET 02/22/2024   7.  Caregiver to teachback freezing of gait strategies to maintain household ambulation    Baseline: cues have been provided to patient   Goal status: IN PROGRESS 02/22/2024   8. 3 M walk test backwards to be performed in 10 sec or less with no retropulsion or hastening, min guard.   Baseline:  10.79 sec hastening and max assist to prevent retropulsion fall   Goal status:  INITIAL 02/22/2024   ASSESSMENT:  CLINICAL IMPRESSION: Patient arrived to session without complaints. Session focused on review of seated PWR moves with patient and caregiver- he was able to demo safety with cueing, thus provided resources for videos to improve carryover at home. Standing balance activities were performed safely with CGA and cueing and these were updated into HEP to be performed with caregiver assist. Patient tolerated session well and without complaints at end of appointment. OBJECTIVE IMPAIRMENTS: Abnormal gait, decreased activity tolerance, decreased balance, decreased endurance, decreased mobility, difficulty walking, decreased ROM, decreased strength, decreased safety awareness, hypomobility, increased fascial restrictions, increased muscle spasms, impaired flexibility, impaired UE functional use, improper body mechanics, postural dysfunction, and pain.   ACTIVITY LIMITATIONS: carrying, lifting, bending, sitting, standing, squatting, stairs, transfers, bed mobility, bathing, toileting, dressing, reach over head, hygiene/grooming, and locomotion level  PARTICIPATION LIMITATIONS: meal prep, cleaning, laundry, medication management, driving, shopping, community activity, and yard work  PERSONAL FACTORS: Age, Fitness, Past/current experiences, and Time since onset of injury/illness/exacerbation are also affecting patient's functional outcome.   REHAB POTENTIAL: Good  CLINICAL DECISION MAKING: Evolving/moderate  complexity  EVALUATION COMPLEXITY: Moderate  PLAN:  PT FREQUENCY: 2x/week  PT DURATION: 6 weeks  PLANNED INTERVENTIONS: 97164- PT Re-evaluation, 97110-Therapeutic exercises, 97530- Therapeutic activity, 97112- Neuromuscular re-education, 97535- Self Care, [id number]- Manual therapy, [id number]- Gait training, [phone]- Aquatic Therapy, [phone]- Electrical stimulation (unattended), [phone] (1-2 muscles), [id number] (3+ muscles)- Dry Needling, Patient/Family education, Balance  training, Stair training, Taping, Joint mobilization, Spinal mobilization, Cryotherapy, and Moist heat  PLAN FOR NEXT SESSION: DC on 03/04/2024.  Review of cervical exercises in [address] [place], [place], DPT 02/24/2024 10:15 AM  [facility] [address] [place], [address] [place], [place] [address] Phone # [phone] Fax # [phone]     "

## 2024-02-24 ENCOUNTER — Encounter: Payer: Self-pay | Admitting: Physical Therapy

## 2024-02-24 ENCOUNTER — Ambulatory Visit: Admitting: Physical Therapy

## 2024-02-24 DIAGNOSIS — R269 Unspecified abnormalities of gait and mobility: Secondary | ICD-10-CM

## 2024-02-24 DIAGNOSIS — R293 Abnormal posture: Secondary | ICD-10-CM

## 2024-02-24 DIAGNOSIS — R2681 Unsteadiness on feet: Secondary | ICD-10-CM

## 2024-02-24 DIAGNOSIS — M6281 Muscle weakness (generalized): Secondary | ICD-10-CM

## 2024-02-24 DIAGNOSIS — R2689 Other abnormalities of gait and mobility: Secondary | ICD-10-CM

## 2024-02-29 NOTE — Therapy (Signed)
 " OUTPATIENT PHYSICAL THERAPY TREATMENT NOTE   Patient Name: Cody Dickerson MRN: [id number] DOB:1950-09-30, 73 y.o., male Today's Date: 03/01/2024   PCP: Okey Carlin Redbird, MD REFERRING PROVIDER: Tat, Asberry RAMAN, DO         END OF SESSION:  PT End of Session - 03/01/24 1011     Visit Number 26    Number of Visits 27    Date for Recertification  03/04/24    Authorization Type Medicare/[employer]!!!!!!!    Progress Note Due on Visit --   completed at 24   PT Start Time 0933    PT Stop Time 1015    PT Time Calculation (min) 42 min    Equipment Utilized During Treatment Gait belt    Activity Tolerance Patient tolerated treatment well    Behavior During Therapy [facility] for tasks assessed/performed;Impulsive                           Past Medical History:  Diagnosis Date   Balance problem    Gout    Hypercholesterolemia    Hypertension    Parkinson's disease (HCC)    Small bowel obstruction (HCC)    Past Surgical History:  Procedure Laterality Date   BOWEL BLOCKAGE  05/2019   IR ANGIO INTRA EXTRACRAN SEL COM CAROTID INNOMINATE BILAT MOD SED  10/03/2021   IR ANGIO VERTEBRAL SEL VERTEBRAL UNI R MOD SED  10/03/2021   IR RADIOLOGIST EVAL & MGMT  08/19/2021   IR US  GUIDE VASC ACCESS RIGHT  10/04/2021   Patient Active Problem List   Diagnosis Date Noted   Stenosis of right carotid artery 08/14/2023   Orthostatic hypotension 08/14/2023   Supine hypertension 08/14/2023   Hypercholesteremia 08/14/2023   Pain due to onychomycosis of toenails of both feet 09/03/2022   Porokeratosis 06/04/2022   Parkinson's disease (HCC) 02/06/2021   SBO (small bowel obstruction) (HCC) 07/06/2019    ONSET DATE: 3-4 years  REFERRING DIAG: R53.1 (ICD-10-CM) - Strength loss of R26.9 (ICD-10-CM) - Gait difficulty R26.89 (ICD-10-CM) - Balance problem Z74.09 (ICD-10-CM) - Impaired functional mobility, balance, and endurance  THERAPY DIAG:  Unsteadiness on feet  Other abnormalities of  gait and mobility  Muscle weakness (generalized)  Abnormal posture  Rationale for Evaluation and Treatment: Rehabilitation  SUBJECTIVE:                                                                                                                                                                                             SUBJECTIVE STATEMENT: Nothing new. No falls. Did 3 out of 4 of the  power moves because I didn't have a chair.   Pt accompanied by: Caregiver- Mary in waiting room   PERTINENT HISTORY: Parkinson's, Left distal supraclinoid ICA aneurysm, mild cognitive impairment Has been doing HHPT 1x/wk  PAIN:  Are you having pain? Yes: NPRS scale: 5/10 Pain location: Base of neck Pain description: sore Aggravating factors: holding head down Relieving factors: Advil   PRECAUTIONS: None  RED FLAGS: None   WEIGHT BEARING RESTRICTIONS: No  FALLS: Has patient fallen in last 6 months? Yes. Number of falls last fell out of chair ~1.5 weeks ago; 2-3 falls   LIVING ENVIRONMENT: Lives with: lives with their family and Has a caregiver present 8a-12p daily; Wife is otherwise present Lives in: House/apartment Stairs: 3 steps to enter/exit Has following equipment at home: Vannie - 2 wheeled and Wheelchair (manual)  PLOF: Needs assistance with ADLs and Needs assistance with gait  PATIENT GOALS: Improve neck posture and balance  OBJECTIVE:     TODAY'S TREATMENT: 03/01/24 Activity Comments  review of HEP for cervical spine: - Seated Thoracic Lumbar Extension with Pectoralis Stretch 10 reps - Seated Cervical Retraction Extension Passive Loaded  5 reps - Seated Cervical Retraction  5 reps - Seated Diagonal Chops with Medicine Ball  2x5 - Supine Isometric Neck Extension  10x Cueing for proper positioning, proper hand placement, avoiding pushing excessively on jaw with cervical retractions. Pt requires reminders to try to maintain neck in as close to neutral as possible    Stand  pivot with RW w/c to mat, then nustep to w/c  CGA  Lateral scooting in hooklying Verbal cues for foot positioning and technique   Bridge 2x10 Cues d/t hip instability. PT holding pillows still   Supine>sit Verbal cues and CGA  Nustep L5 x 6 min UEs/LEs Maintaining 94 SPM      PATIENT EDUCATION: Education details: discussed DC next session and scheduled pt for evaluation in 6 months d/t chronic nature of diagnosis; discussed exercise at [facility] however patient reports that he is planning on cancelling his membership  Person educated: Patient Education method: Explanation, Demonstration, Tactile cues, Verbal cues, and Handouts Education comprehension: verbalized understanding and returned demonstration   HOME EXERCISE PROGRAM: Access Code: [email or web address] URL: [email or web address] Date: 02/24/2024 Prepared by: [facility] - Outpatient  Rehab - [facility]  Program Notes perform all standing exercises with caregiver/family assistance  Exercises - Seated Thoracic Lumbar Extension with Pectoralis Stretch  - 3 x daily - 7 x weekly - 2-3 sets - 10 reps - Seated Cervical Retraction Extension Passive Loaded  - 3 x daily - 7 x weekly - 2-3 sets - 10 reps - Seated Cervical Retraction  - 1 x daily - 7 x weekly - 2 sets - 5 reps - 5 sec hold - Seated Diagonal Chops with Medicine Ball  - 1 x daily - 7 x weekly - 2 sets - 10 reps - Supine Isometric Neck Extension  - 1 x daily - 7 x weekly - 3 sets - 10 reps - 2 sec hold - Sit to Stand with Armchair  - 1 x daily - 7 x weekly - 3 sets - 5 reps - Seated Anterior Pelvic Tilt  - 1 x daily - 7 x weekly - 3 sets - 5 reps - Alternating Step Forward with Support  - 1 x daily - 3 x weekly - 2 sets - 10 reps - Alternating Step Backward with Support  - 1 x daily - 3 x weekly - 2 sets -  10 reps - Side Stepping with Counter Support  - 1 x daily - 3 x weekly - 2 sets - 10 reps - Backward Walking with Counter Support  - 1 x daily - 3 x weekly - 2 sets -  10 reps   + sitting PWR moves 2x10 reps daily    Note: Objective measures were completed at Evaluation unless otherwise noted.  DIAGNOSTIC FINDINGS: n/a  COGNITION: Overall cognitive status: History of cognitive impairments - at baseline  COORDINATION: Tremors present throughout  POSTURE: rounded shoulders, forward head, and increased thoracic kyphosis  CERVICAL ROM: At rest flexed ~40 deg  Active ROM A/PROM (deg) eval  Flexion Can get chin to chest  Extension -40 to -20 deg (unable to go into full extension)  Right lateral flexion 40  Left lateral flexion 20  Right rotation 35  Left rotation 40   (Blank rows = not tested)   LOWER EXTREMITY ROM:   WNL  LOWER EXTREMITY MMT:    MMT Right Eval Left Eval  Hip flexion 5 5  Hip extension 4 4  Hip abduction 3+ 3+  Hip adduction    Hip internal rotation    Hip external rotation    Knee flexion 4- 3+  Knee extension 5 5  Ankle dorsiflexion    Ankle plantarflexion    Ankle inversion    Ankle eversion    (Blank rows = not tested)  BED MOBILITY:  Findings: Sit to supine SBA Supine to sit SBA Multiple small scooting motions  TRANSFERS: Sit to stand: Min A  Assistive device utilized: Environmental Consultant - 2 wheeled     Stand to sit: CGA and Min A  Assistive device utilized: Environmental Consultant - 2 wheeled     Min A for stability -- will at times require multiple attempts to come to standing  GAIT: Findings: Gait Characteristics: Festination at times, step through pattern, decreased step length- Right, decreased step length- Left, decreased stance time- Right, decreased stance time- Left, shuffling, trunk flexed, poor foot clearance- Right, and poor foot clearance- Left, Distance walked: ~10', and Comments: RW  FUNCTIONAL TESTS:  5 times sit to stand: 25.84 sec -- uses UEs to push off mat table, legs braced against table to stand, mild tremors/unsteadiness, requires a few attempts to obtain momentum to stand Timed up and go (TUG): 23.06 sec  -- shuffling with turns, diminished step length, required 2 attempts to use momentum to stand  PATIENT SURVEYS:  PSFS: THE PATIENT SPECIFIC FUNCTIONAL SCALE  Place score of 0-10 (0 = unable to perform activity and 10 = able to perform activity at the same level as before injury or problem)  Activity Date: 11/05/23    Holding head up 5    2.  Standing balance  5    3.       4.      Total Score 5      Total Score = Sum of activity scores/number of activities  Minimally Detectable Change: 3 points (for single activity); 2 points (for average score)  [facility] (nd). The Patient Specific Functional Scale . Retrieved from [email or web address]  GOALS: Goals reviewed with patient? Yes  SHORT TERM GOALS: Target date: 02/02/2024     Pt will be able to perform HEP with caregiver min A Baseline: Goal status: MET 12/01/23  2.  Pt will be able to maintain neck extension to at least within 10 deg of neutral Baseline: pt is 40 to 20 deg away from neutral; 17 degrees flexion. Able to get to actively get to neutral neck posture 9.30.25 Goal status: NOT MET 12/01/23  3.  Demo modified independent stand-pivot transfers from w/c to various surfaces to improve safety with maneuvering in small spaces  Baseline: CGA-SBA-continues to need CGA for safety 02/02/2024  Goal status: IN PROGRESS   LONG TERM GOALS: Target date: 02/23/2024>03/04/2024     Pt will be able to perform head/neck and postural strengthening with SBA Baseline: pt and caregiver have reported understanding; updates provided mid December Goal status: IN PROGRESS 02/22/2024  2.  Pt will be able to maintain neutral neck for at least 10 min for ADLs Baseline: Unable; unchanged  Goal status:NOT MET 02/22/2024  3.  Pt will report improved  PSFS by >/=7 to demo MCID Baseline: 5; 7 12/22/23 Goal status: MET 12/22/23  4.  Pt will have improved 5x STS to </=20 sec to demo increased functional LE strength Baseline: 25.84 sec; 19.17 sec with UE; 16 sec w/ BUE; 21.06 sec 02/22/2024 Goal status: NOT MET 02/22/2024  5.  Pt will have improved TUG to </=15 sec to decrease fall risk Baseline: 23.06 sec; 23.93; 17 sec; 19.84 sec with RW 02/22/2024 Goal status: NOT MET, 02/22/2024    6.  Improve Berg Balance Test score to 45/56 to reduce risk for falls and improve safety    Baseline: 38/56; 39/56 02/22/2024    Goal status: NOT MET 02/22/2024   7.  Caregiver to teachback freezing of gait strategies to maintain household ambulation    Baseline: cues have been provided to patient   Goal status: IN PROGRESS 02/22/2024   8. 3 M walk test backwards to be performed in 10 sec or less with no retropulsion or hastening, min guard.   Baseline:  10.79 sec hastening and max assist to prevent retropulsion fall   Goal status:  INITIAL 02/22/2024   ASSESSMENT:  CLINICAL IMPRESSION: Patient arrived to session without new complaints. Session focused on review of cervical HEP exercises with demo and cueing for proper positioning and alignment. Patient tolerated all activities well. Worked on bed mobility techniques with cueing for max success. Patient tolerated session well and without complaints at end of appointment. OBJECTIVE IMPAIRMENTS: Abnormal gait, decreased activity tolerance, decreased balance, decreased endurance, decreased mobility, difficulty walking, decreased ROM, decreased strength, decreased safety awareness, hypomobility, increased fascial restrictions, increased muscle spasms, impaired flexibility, impaired UE functional use, improper body mechanics, postural dysfunction, and pain.   ACTIVITY LIMITATIONS: carrying, lifting, bending, sitting, standing, squatting, stairs, transfers, bed mobility, bathing, toileting, dressing, reach over  head, hygiene/grooming, and locomotion level  PARTICIPATION LIMITATIONS: meal prep, cleaning, laundry, medication management, driving, shopping, community activity, and yard work  PERSONAL FACTORS: Age, Fitness, Past/current experiences, and Time since onset of injury/illness/exacerbation are also affecting patient's functional outcome.   REHAB POTENTIAL: Good  CLINICAL DECISION MAKING: Evolving/moderate complexity  EVALUATION COMPLEXITY: Moderate  PLAN:  PT FREQUENCY: 2x/week  PT DURATION: 6 weeks  PLANNED INTERVENTIONS: 97164- PT Re-evaluation, 97110-Therapeutic exercises, 97530- Therapeutic activity, [id number]- Neuromuscular re-education, 97535- Self Care, [id number]- Manual therapy, [id number]- Gait training, [phone]- Aquatic Therapy, [phone]- Electrical stimulation (unattended), [phone] (1-2 muscles),  [id number] (3+ muscles)- Dry Needling, Patient/Family education, Balance training, Stair training, Taping, Joint mobilization, Spinal mobilization, Cryotherapy, and Moist heat  PLAN FOR NEXT SESSION: DC on 03/04/2024. Patient is scheduled for return PT eval on 08/30/24       Louana Terrilyn Christians, PT, DPT 03/01/2024 10:16 AM  [facility] at [facility] [address] [place], [address] [place], [place] [address] Phone # [phone] Fax # [phone]     "

## 2024-03-01 ENCOUNTER — Encounter: Payer: Self-pay | Admitting: Physical Therapy

## 2024-03-01 ENCOUNTER — Ambulatory Visit: Admitting: Physical Therapy

## 2024-03-01 DIAGNOSIS — R2681 Unsteadiness on feet: Secondary | ICD-10-CM | POA: Diagnosis not present

## 2024-03-01 DIAGNOSIS — R2689 Other abnormalities of gait and mobility: Secondary | ICD-10-CM

## 2024-03-01 DIAGNOSIS — M6281 Muscle weakness (generalized): Secondary | ICD-10-CM

## 2024-03-01 DIAGNOSIS — R293 Abnormal posture: Secondary | ICD-10-CM

## 2024-03-04 ENCOUNTER — Telehealth: Payer: Self-pay | Admitting: Neurology

## 2024-03-04 ENCOUNTER — Ambulatory Visit: Attending: Neurology

## 2024-03-04 DIAGNOSIS — R2689 Other abnormalities of gait and mobility: Secondary | ICD-10-CM | POA: Diagnosis present

## 2024-03-04 DIAGNOSIS — R269 Unspecified abnormalities of gait and mobility: Secondary | ICD-10-CM | POA: Insufficient documentation

## 2024-03-04 DIAGNOSIS — M6281 Muscle weakness (generalized): Secondary | ICD-10-CM | POA: Diagnosis present

## 2024-03-04 DIAGNOSIS — R293 Abnormal posture: Secondary | ICD-10-CM | POA: Insufficient documentation

## 2024-03-04 DIAGNOSIS — R2681 Unsteadiness on feet: Secondary | ICD-10-CM | POA: Diagnosis present

## 2024-03-04 NOTE — Telephone Encounter (Signed)
 Ivie with [employer] at  [phone] called this afternoon and she stated that the  Physician needs to sign the Evaluation  form  for the manual wheel chair.  Ivie will fax the form over  today so it can be sign by the Physician. Thanks

## 2024-03-04 NOTE — Therapy (Signed)
 " OUTPATIENT PHYSICAL THERAPY TREATMENT NOTE and D/C Summary   Patient Name: Cody Dickerson MRN: [id number] DOB:08/10/1950, 74 y.o., male Today's Date: 03/04/2024   PCP: Okey Carlin Redbird, MD REFERRING PROVIDER: Tat, Asberry RAMAN, DO    PHYSICAL THERAPY DISCHARGE SUMMARY  Visits from Start of Care: 27  Current functional level related to goals / functional outcomes: See below in LTG section for outcome measures   Remaining deficits: Postural control, bradykinesia, freezing of gait, high risk for falls   Education / Equipment: HEP and recommend walking routine at local gym for open area for practice in stride length/gait speed with caregiver   Patient agrees to discharge. Patient goals were partially met. Patient is being discharged due to being pleased with the current functional level.      END OF SESSION:  PT End of Session - 03/04/24 0927     Visit Number 27    Number of Visits 27    Date for Recertification  03/04/24    Authorization Type Medicare/[employer]!!!!!!!    Progress Note Due on Visit --   completed at 24   PT Start Time 0930    PT Stop Time 1015    PT Time Calculation (min) 45 min    Equipment Utilized During Treatment Gait belt    Activity Tolerance Patient tolerated treatment well    Behavior During Therapy WFL for tasks assessed/performed;Impulsive                           Past Medical History:  Diagnosis Date   Balance problem    Gout    Hypercholesterolemia    Hypertension    Parkinson's disease (HCC)    Small bowel obstruction (HCC)    Past Surgical History:  Procedure Laterality Date   BOWEL BLOCKAGE  05/2019   IR ANGIO INTRA EXTRACRAN SEL COM CAROTID INNOMINATE BILAT MOD SED  10/03/2021   IR ANGIO VERTEBRAL SEL VERTEBRAL UNI R MOD SED  10/03/2021   IR RADIOLOGIST EVAL & MGMT  08/19/2021   IR US  GUIDE VASC ACCESS RIGHT  10/04/2021   Patient Active Problem List   Diagnosis Date Noted   Stenosis of right carotid artery  08/14/2023   Orthostatic hypotension 08/14/2023   Supine hypertension 08/14/2023   Hypercholesteremia 08/14/2023   Pain due to onychomycosis of toenails of both feet 09/03/2022   Porokeratosis 06/04/2022   Parkinson's disease (HCC) 02/06/2021   SBO (small bowel obstruction) (HCC) 07/06/2019    ONSET DATE: 3-4 years  REFERRING DIAG: R53.1 (ICD-10-CM) - Strength loss of R26.9 (ICD-10-CM) - Gait difficulty R26.89 (ICD-10-CM) - Balance problem Z74.09 (ICD-10-CM) - Impaired functional mobility, balance, and endurance  THERAPY DIAG:  Unsteadiness on feet  Other abnormalities of gait and mobility  Muscle weakness (generalized)  Abnormal posture  Gait difficulty  Rationale for Evaluation and Treatment: Rehabilitation  SUBJECTIVE:  SUBJECTIVE STATEMENT: Doing well  Pt accompanied by: Caregiver- Mary in waiting room   PERTINENT HISTORY: Parkinson's, Left distal supraclinoid ICA aneurysm, mild cognitive impairment Has been doing HHPT 1x/wk  PAIN:  Are you having pain? Yes: NPRS scale: 0/10 Pain location: Base of neck Pain description: sore Aggravating factors: holding head down Relieving factors: Advil   PRECAUTIONS: None  RED FLAGS: None   WEIGHT BEARING RESTRICTIONS: No  FALLS: Has patient fallen in last 6 months? Yes. Number of falls last fell out of chair ~1.5 weeks ago; 2-3 falls   LIVING ENVIRONMENT: Lives with: lives with their family and Has a caregiver present 8a-12p daily; Wife is otherwise present Lives in: House/apartment Stairs: 3 steps to enter/exit Has following equipment at home: Vannie - 2 wheeled and Wheelchair (manual)  PLOF: Needs assistance with ADLs and Needs assistance with gait  PATIENT GOALS: Improve neck posture and balance  OBJECTIVE:   TODAY'S  TREATMENT: 03/04/24 Activity Comments  LTG performance and review   HEP review Good recall, cues for improved backwards walking for increased step length and control  Nustep level 5 x 8 min 100 SPM for rapid alternating movement  Gait training Strategies for step length and negotiating 180 degree turns            TODAY'S TREATMENT: 03/01/24 Activity Comments  review of HEP for cervical spine: - Seated Thoracic Lumbar Extension with Pectoralis Stretch 10 reps - Seated Cervical Retraction Extension Passive Loaded  5 reps - Seated Cervical Retraction  5 reps - Seated Diagonal Chops with Medicine Ball  2x5 - Supine Isometric Neck Extension  10x Cueing for proper positioning, proper hand placement, avoiding pushing excessively on jaw with cervical retractions. Pt requires reminders to try to maintain neck in as close to neutral as possible    Stand pivot with RW w/c to mat, then nustep to w/c  CGA  Lateral scooting in hooklying Verbal cues for foot positioning and technique   Bridge 2x10 Cues d/t hip instability. PT holding pillows still   Supine>sit Verbal cues and CGA  Nustep L5 x 6 min UEs/LEs Maintaining 94 SPM      PATIENT EDUCATION: Education details: discussed DC next session and scheduled pt for evaluation in 6 months d/t chronic nature of diagnosis; discussed exercise at [facility] however patient reports that he is planning on cancelling his membership  Person educated: Patient Education method: Explanation, Demonstration, Tactile cues, Verbal cues, and Handouts Education comprehension: verbalized understanding and returned demonstration   HOME EXERCISE PROGRAM: Access Code: [email or web address] URL: [email or web address] Date: 02/24/2024 Prepared by: [facility] - Outpatient  Rehab - [facility]  Program Notes perform all standing exercises with caregiver/family assistance  Exercises - Seated Thoracic Lumbar Extension with Pectoralis Stretch  - 3 x daily - 7 x weekly -  2-3 sets - 10 reps - Seated Cervical Retraction Extension Passive Loaded  - 3 x daily - 7 x weekly - 2-3 sets - 10 reps - Seated Cervical Retraction  - 1 x daily - 7 x weekly - 2 sets - 5 reps - 5 sec hold - Seated Diagonal Chops with Medicine Ball  - 1 x daily - 7 x weekly - 2 sets - 10 reps - Supine Isometric Neck Extension  - 1 x daily - 7 x weekly - 3 sets - 10 reps - 2 sec hold - Sit to Stand with Armchair  - 1 x daily - 7 x weekly - 3 sets - 5 reps - Seated Anterior  Pelvic Tilt  - 1 x daily - 7 x weekly - 3 sets - 5 reps - Alternating Step Forward with Support  - 1 x daily - 3 x weekly - 2 sets - 10 reps - Alternating Step Backward with Support  - 1 x daily - 3 x weekly - 2 sets - 10 reps - Side Stepping with Counter Support  - 1 x daily - 3 x weekly - 2 sets - 10 reps - Backward Walking with Counter Support  - 1 x daily - 3 x weekly - 2 sets - 10 reps   + sitting PWR moves 2x10 reps daily    Note: Objective measures were completed at Evaluation unless otherwise noted.  DIAGNOSTIC FINDINGS: n/a  COGNITION: Overall cognitive status: History of cognitive impairments - at baseline  COORDINATION: Tremors present throughout  POSTURE: rounded shoulders, forward head, and increased thoracic kyphosis  CERVICAL ROM: At rest flexed ~40 deg  Active ROM A/PROM (deg) eval  Flexion Can get chin to chest  Extension -40 to -20 deg (unable to go into full extension)  Right lateral flexion 40  Left lateral flexion 20  Right rotation 35  Left rotation 40   (Blank rows = not tested)   LOWER EXTREMITY ROM:   WNL  LOWER EXTREMITY MMT:    MMT Right Eval Left Eval  Hip flexion 5 5  Hip extension 4 4  Hip abduction 3+ 3+  Hip adduction    Hip internal rotation    Hip external rotation    Knee flexion 4- 3+  Knee extension 5 5  Ankle dorsiflexion    Ankle plantarflexion    Ankle inversion    Ankle eversion    (Blank rows = not tested)  BED MOBILITY:  Findings: Sit to supine  SBA Supine to sit SBA Multiple small scooting motions  TRANSFERS: Sit to stand: Min A  Assistive device utilized: Environmental Consultant - 2 wheeled     Stand to sit: CGA and Min A  Assistive device utilized: Environmental Consultant - 2 wheeled     Min A for stability -- will at times require multiple attempts to come to standing  GAIT: Findings: Gait Characteristics: Festination at times, step through pattern, decreased step length- Right, decreased step length- Left, decreased stance time- Right, decreased stance time- Left, shuffling, trunk flexed, poor foot clearance- Right, and poor foot clearance- Left, Distance walked: ~10', and Comments: RW  FUNCTIONAL TESTS:  5 times sit to stand: 25.84 sec -- uses UEs to push off mat table, legs braced against table to stand, mild tremors/unsteadiness, requires a few attempts to obtain momentum to stand Timed up and go (TUG): 23.06 sec -- shuffling with turns, diminished step length, required 2 attempts to use momentum to stand  PATIENT SURVEYS:  PSFS: THE PATIENT SPECIFIC FUNCTIONAL SCALE  Place score of 0-10 (0 = unable to perform activity and 10 = able to perform activity at the same level as before injury or problem)  Activity Date: 11/05/23    Holding head up 5    2.  Standing balance  5    3.       4.      Total Score 5      Total Score = Sum of activity scores/number of activities  Minimally Detectable Change: 3 points (for single activity); 2 points (for average score)  [facility] (nd). The Patient Specific Functional Scale . Retrieved from [email or web address]  GOALS: Goals reviewed with patient? Yes  SHORT TERM GOALS: Target date: 02/02/2024     Pt will be able to perform HEP with caregiver min A Baseline: Goal status: MET 12/01/23  2.  Pt will be able to  maintain neck extension to at least within 10 deg of neutral Baseline: pt is 40 to 20 deg away from neutral; 17 degrees flexion. Able to get to actively get to neutral neck posture 9.30.25 Goal status: NOT MET 12/01/23  3.  Demo modified independent stand-pivot transfers from w/c to various surfaces to improve safety with maneuvering in small spaces  Baseline: CGA-SBA-continues to need CGA for safety; modified indep  Goal status: MET   LONG TERM GOALS: Target date: 02/23/2024>03/04/2024     Pt will be able to perform head/neck and postural strengthening with SBA Baseline: pt and caregiver have reported understanding; updates provided mid December Goal status: MET  2.  Pt will be able to maintain neutral neck for at least 10 min for ADLs Baseline: Unable; unchanged  Goal status:NOT MET   3.  Pt will report improved PSFS by >/=7 to demo MCID Baseline: 5; 7 12/22/23 Goal status: MET 12/22/23  4.  Pt will have improved 5x STS to </=20 sec to demo increased functional LE strength Baseline: 25.84 sec; 19.17 sec with UE; 16 sec w/ BUE; 21.06 sec; 46 sec w/ BUE Goal status: NOT MET  5.  Pt will have improved TUG to </=15 sec to decrease fall risk Baseline: 23.06 sec; 23.93; 17 sec; 19.84 sec with RW; 29 sec w/ RW Goal status: NOT MET    6.  Improve Berg Balance Test score to 45/56 to reduce risk for falls and improve safety    Baseline: 38/56; 39/56    Goal status: NOT MET   7.  Caregiver to teachback freezing of gait strategies to maintain household ambulation    Baseline: cues have been provided to patient   Goal status: MET   8. 3 M walk test backwards to be performed in 10 sec or less with no retropulsion or hastening, min guard.   Baseline:  10.79 sec hastening and max assist to prevent retropulsion fall   Goal status:  NOT MET   ASSESSMENT:  CLINICAL IMPRESSION: D/C assessment today with decreased performance 5xSTS test requiring increased time and BUE support and time 46  sec indicating risk for falls.  TUG test also with decreased performance requiring increased time to complete indicating increased risk for falls. Berg Balance Test score also indicative of high risk for falls.  Difficulty with retropulsion with sit to stand transfers and when confornted with backwards walking movements/demands requiring UE support and supervision-CGA for cues in maintaining control.  Good performance to HEP and therapist recommends seeking space conducive to walking for speed/stride length with caregiver present for guarding. Difficulty with upright posture but reports decreased physical effort for head/neck extension.  Pt will D/C to HEP at this time and f/u in 6 months for screening or earlier as needed.  OBJECTIVE IMPAIRMENTS: Abnormal gait, decreased activity tolerance, decreased balance, decreased endurance, decreased mobility, difficulty walking, decreased ROM, decreased strength, decreased safety awareness, hypomobility, increased fascial restrictions, increased muscle spasms, impaired flexibility, impaired UE functional use, improper body mechanics, postural dysfunction, and pain.   ACTIVITY LIMITATIONS: carrying, lifting, bending, sitting, standing, squatting, stairs, transfers, bed mobility, bathing, toileting, dressing, reach over head, hygiene/grooming, and locomotion level  PARTICIPATION LIMITATIONS: meal prep, cleaning, laundry, medication management, driving, shopping, community activity, and yard work  PERSONAL FACTORS: Age, Fitness, Past/current experiences, and Time since onset of injury/illness/exacerbation are also affecting patient's functional outcome.   REHAB POTENTIAL: Good  CLINICAL DECISION MAKING: Evolving/moderate complexity  EVALUATION COMPLEXITY: Moderate  PLAN:  PT FREQUENCY: 2x/week  PT DURATION: 6 weeks  PLANNED INTERVENTIONS: 97164- PT Re-evaluation, 97110-Therapeutic exercises, 97530- Therapeutic activity, 97112- Neuromuscular re-education,  97535- Self Care, [id number]- Manual therapy, [id number]- Gait training, [phone]- Aquatic Therapy, [phone]- Electrical stimulation (unattended), [phone] (1-2 muscles), 20561 (3+ muscles)- Dry Needling, Patient/Family education, Balance training, Stair training, Taping, Joint mobilization, Spinal mobilization, Cryotherapy, and Moist heat  PLAN FOR NEXT SESSION: DC on 03/04/2024. Patient is scheduled for return PT eval on 08/30/24       10:19 AM, 03/04/2024 M. Kelly Nycere Presley, PT, DPT Physical Therapist- [place] Office Number: [phone]      "

## 2024-03-04 NOTE — Telephone Encounter (Signed)
 Resent order form and called to let them know I have already faxed the form unless I am missing something and to resend form if it needs to be filled out

## 2024-03-08 ENCOUNTER — Ambulatory Visit: Admitting: Neurology

## 2024-03-09 ENCOUNTER — Telehealth: Payer: Self-pay | Admitting: Neurology

## 2024-03-09 NOTE — Telephone Encounter (Signed)
 Montie with [employer] at [phone] called this morning  and she stated that the Physician needs to sign the Evaluation form for the manual wheel chair.Montie  stated that,  they only receive 15 pages out  of the 19 sheets they fax over. Montie  stated they needs all pages, they did not get the page where the Physician signed  Please call.

## 2024-04-01 ENCOUNTER — Telehealth: Admitting: Neurology

## 2024-04-01 ENCOUNTER — Encounter: Payer: Self-pay | Admitting: Neurology

## 2024-04-01 DIAGNOSIS — G20A1 Parkinson's disease without dyskinesia, without mention of fluctuations: Secondary | ICD-10-CM | POA: Diagnosis not present

## 2024-04-01 MED ORDER — CARBIDOPA-LEVODOPA 25-100 MG PO TABS: ORAL_TABLET | ORAL | 1 refills | Status: AC

## 2024-04-01 MED ORDER — PRAMIPEXOLE DIHYDROCHLORIDE 0.5 MG PO TABS: ORAL_TABLET | ORAL | 1 refills | Status: AC

## 2024-04-01 NOTE — Patient Instructions (Addendum)
 Sorry you got disconnected at the end.  I stayed connected for 10 more minutes.  I did see you tried to connect one time.  I'm glad we were all the way down to refilling your medications!  I did refill them at the [facility].  My staff will call you to schedule a follow up in the future.  Stay safe and warm in the snow.

## 2024-05-09 ENCOUNTER — Ambulatory Visit: Admitting: Podiatry

## 2024-08-30 ENCOUNTER — Ambulatory Visit: Attending: Neurology | Admitting: Physical Therapy

## 2024-10-04 ENCOUNTER — Ambulatory Visit: Payer: Self-pay | Admitting: Neurology
